# Patient Record
Sex: Female | Born: 1935 | Race: White | Hispanic: No | Smoking: Former smoker
Health system: Southern US, Community
[De-identification: ages and names within clinical notes are randomized; demographics above are authoritative.]

## PROBLEM LIST (undated history)

## (undated) DIAGNOSIS — IMO0002 Reserved for concepts with insufficient information to code with codable children: Secondary | ICD-10-CM

## (undated) DIAGNOSIS — E785 Hyperlipidemia, unspecified: Secondary | ICD-10-CM

## (undated) DIAGNOSIS — I10 Essential (primary) hypertension: Secondary | ICD-10-CM

## (undated) DIAGNOSIS — J449 Chronic obstructive pulmonary disease, unspecified: Secondary | ICD-10-CM

## (undated) DIAGNOSIS — E079 Disorder of thyroid, unspecified: Secondary | ICD-10-CM

## (undated) DIAGNOSIS — M1990 Unspecified osteoarthritis, unspecified site: Secondary | ICD-10-CM

## (undated) DIAGNOSIS — G44009 Cluster headache syndrome, unspecified, not intractable: Secondary | ICD-10-CM

## (undated) DIAGNOSIS — C50919 Malignant neoplasm of unspecified site of unspecified female breast: Secondary | ICD-10-CM

## (undated) HISTORY — DX: Chronic obstructive pulmonary disease, unspecified: J44.9

## (undated) HISTORY — DX: Malignant neoplasm of unspecified site of unspecified female breast: C50.919

## (undated) HISTORY — DX: Unspecified osteoarthritis, unspecified site: M19.90

## (undated) HISTORY — DX: Reserved for concepts with insufficient information to code with codable children: IMO0002

## (undated) HISTORY — DX: Hyperlipidemia, unspecified: E78.5

## (undated) HISTORY — DX: Essential (primary) hypertension: I10

## (undated) HISTORY — DX: Cluster headache syndrome, unspecified, not intractable: G44.009

## (undated) HISTORY — DX: Disorder of thyroid, unspecified: E07.9

---

## 1963-03-29 HISTORY — PX: TOE SURGERY: SHX1073

## 1989-03-28 DIAGNOSIS — C50919 Malignant neoplasm of unspecified site of unspecified female breast: Secondary | ICD-10-CM

## 1989-03-28 HISTORY — PX: BREAST LUMPECTOMY: SHX2

## 1989-03-28 HISTORY — DX: Malignant neoplasm of unspecified site of unspecified female breast: C50.919

## 1991-03-29 HISTORY — PX: OTHER SURGICAL HISTORY: SHX169

## 1991-03-29 HISTORY — PX: NOSE SURGERY: SHX723

## 1997-10-03 ENCOUNTER — Other Ambulatory Visit: Admission: RE | Admit: 1997-10-03 | Discharge: 1997-10-03 | Payer: Self-pay | Admitting: General Surgery

## 1997-11-03 ENCOUNTER — Ambulatory Visit (HOSPITAL_COMMUNITY): Admission: RE | Admit: 1997-11-03 | Discharge: 1997-11-03 | Payer: Self-pay | Admitting: General Surgery

## 1998-08-09 ENCOUNTER — Emergency Department (HOSPITAL_COMMUNITY): Admission: EM | Admit: 1998-08-09 | Discharge: 1998-08-09 | Payer: Self-pay | Admitting: Emergency Medicine

## 1998-08-09 ENCOUNTER — Encounter: Payer: Self-pay | Admitting: Emergency Medicine

## 2000-02-15 ENCOUNTER — Encounter: Admission: RE | Admit: 2000-02-15 | Discharge: 2000-02-15 | Payer: Self-pay | Admitting: Internal Medicine

## 2000-02-15 ENCOUNTER — Encounter: Payer: Self-pay | Admitting: Internal Medicine

## 2001-04-10 ENCOUNTER — Other Ambulatory Visit: Admission: RE | Admit: 2001-04-10 | Discharge: 2001-04-10 | Payer: Self-pay | Admitting: Internal Medicine

## 2001-04-12 ENCOUNTER — Encounter: Payer: Self-pay | Admitting: Internal Medicine

## 2001-04-12 ENCOUNTER — Encounter: Admission: RE | Admit: 2001-04-12 | Discharge: 2001-04-12 | Payer: Self-pay | Admitting: Internal Medicine

## 2002-04-18 ENCOUNTER — Encounter: Payer: Self-pay | Admitting: Internal Medicine

## 2002-04-18 ENCOUNTER — Encounter: Admission: RE | Admit: 2002-04-18 | Discharge: 2002-04-18 | Payer: Self-pay | Admitting: Internal Medicine

## 2003-07-04 ENCOUNTER — Encounter: Admission: RE | Admit: 2003-07-04 | Discharge: 2003-07-04 | Payer: Self-pay | Admitting: Internal Medicine

## 2004-02-13 ENCOUNTER — Ambulatory Visit: Payer: Self-pay | Admitting: Internal Medicine

## 2004-05-18 ENCOUNTER — Ambulatory Visit: Payer: Self-pay | Admitting: Internal Medicine

## 2004-07-13 ENCOUNTER — Encounter: Admission: RE | Admit: 2004-07-13 | Discharge: 2004-07-13 | Payer: Self-pay | Admitting: Internal Medicine

## 2005-01-21 ENCOUNTER — Ambulatory Visit: Payer: Self-pay | Admitting: Internal Medicine

## 2005-02-23 ENCOUNTER — Ambulatory Visit: Payer: Self-pay | Admitting: Internal Medicine

## 2005-07-05 ENCOUNTER — Ambulatory Visit: Payer: Self-pay | Admitting: Internal Medicine

## 2005-07-12 ENCOUNTER — Ambulatory Visit: Payer: Self-pay | Admitting: Internal Medicine

## 2005-07-19 ENCOUNTER — Encounter: Admission: RE | Admit: 2005-07-19 | Discharge: 2005-07-19 | Payer: Self-pay | Admitting: Internal Medicine

## 2005-12-30 ENCOUNTER — Ambulatory Visit: Payer: Self-pay | Admitting: Internal Medicine

## 2006-07-25 ENCOUNTER — Encounter: Admission: RE | Admit: 2006-07-25 | Discharge: 2006-07-25 | Payer: Self-pay | Admitting: Internal Medicine

## 2006-08-03 ENCOUNTER — Ambulatory Visit: Payer: Self-pay | Admitting: Internal Medicine

## 2006-08-03 LAB — CONVERTED CEMR LAB
AST: 31 units/L (ref 0–37)
CO2: 32 meq/L (ref 19–32)
Cholesterol: 171 mg/dL (ref 0–200)
Creatinine, Ser: 1.1 mg/dL (ref 0.4–1.2)
HDL: 38.1 mg/dL — ABNORMAL LOW (ref >39.0)
Sodium: 141 meq/L (ref 135–145)
Triglycerides: 210 mg/dL (ref 0–149)

## 2007-01-09 ENCOUNTER — Ambulatory Visit: Payer: Self-pay | Admitting: Internal Medicine

## 2007-04-20 ENCOUNTER — Encounter: Payer: Self-pay | Admitting: Internal Medicine

## 2007-04-20 DIAGNOSIS — I10 Essential (primary) hypertension: Secondary | ICD-10-CM | POA: Insufficient documentation

## 2007-04-20 DIAGNOSIS — Z853 Personal history of malignant neoplasm of breast: Secondary | ICD-10-CM

## 2007-04-20 DIAGNOSIS — E785 Hyperlipidemia, unspecified: Secondary | ICD-10-CM

## 2007-08-07 ENCOUNTER — Encounter: Payer: Self-pay | Admitting: Internal Medicine

## 2007-08-07 ENCOUNTER — Ambulatory Visit: Payer: Self-pay | Admitting: Family Medicine

## 2007-08-30 ENCOUNTER — Encounter: Payer: Self-pay | Admitting: Internal Medicine

## 2007-09-04 ENCOUNTER — Encounter: Admission: RE | Admit: 2007-09-04 | Discharge: 2007-09-04 | Payer: Self-pay | Admitting: Internal Medicine

## 2007-09-10 ENCOUNTER — Telehealth (INDEPENDENT_AMBULATORY_CARE_PROVIDER_SITE_OTHER): Payer: Self-pay | Admitting: *Deleted

## 2007-10-03 ENCOUNTER — Encounter: Payer: Self-pay | Admitting: Internal Medicine

## 2007-11-13 ENCOUNTER — Ambulatory Visit: Payer: Self-pay | Admitting: Internal Medicine

## 2007-11-13 DIAGNOSIS — M19079 Primary osteoarthritis, unspecified ankle and foot: Secondary | ICD-10-CM | POA: Insufficient documentation

## 2007-11-13 DIAGNOSIS — M51379 Other intervertebral disc degeneration, lumbosacral region without mention of lumbar back pain or lower extremity pain: Secondary | ICD-10-CM | POA: Insufficient documentation

## 2007-11-13 DIAGNOSIS — F17200 Nicotine dependence, unspecified, uncomplicated: Secondary | ICD-10-CM | POA: Insufficient documentation

## 2007-11-13 DIAGNOSIS — M5137 Other intervertebral disc degeneration, lumbosacral region: Secondary | ICD-10-CM

## 2007-11-13 LAB — CONVERTED CEMR LAB
ALT: 35 units/L (ref 0–35)
Basophils Relative: 0 % (ref 0.0–3.0)
CO2: 27 meq/L (ref 19–32)
Calcium: 9.2 mg/dL (ref 8.4–10.5)
Chloride: 101 meq/L (ref 96–112)
Cholesterol: 182 mg/dL (ref 0–200)
Direct LDL: 115.7 mg/dL
Eosinophils Absolute: 0.2 10*3/uL (ref 0.0–0.7)
Eosinophils Relative: 1.6 % (ref 0.0–5.0)
HCT: 46.4 % — ABNORMAL HIGH (ref 36.0–46.0)
HDL: 45.5 mg/dL (ref >39.0)
MCV: 95 fL (ref 78.0–100.0)
Monocytes Relative: 5.3 % (ref 3.0–12.0)
Neutro Abs: 9.6 10*3/uL — ABNORMAL HIGH (ref 1.4–7.7)
Neutrophils Relative %: 75.5 % (ref 43.0–77.0)
Platelets: 276 10*3/uL (ref 150–400)
RDW: 12.5 % (ref 11.5–14.6)
Sodium: 139 meq/L (ref 135–145)
Total Protein: 7.2 g/dL (ref 6.0–8.3)
Triglycerides: 222 mg/dL (ref 0–149)
VLDL: 44 mg/dL — ABNORMAL HIGH (ref 0–40)
WBC: 12.7 10*3/uL — ABNORMAL HIGH (ref 4.5–10.5)

## 2007-11-14 ENCOUNTER — Encounter: Payer: Self-pay | Admitting: Internal Medicine

## 2008-01-01 ENCOUNTER — Ambulatory Visit: Payer: Self-pay | Admitting: Internal Medicine

## 2008-09-09 ENCOUNTER — Encounter: Admission: RE | Admit: 2008-09-09 | Discharge: 2008-09-09 | Payer: Self-pay | Admitting: Internal Medicine

## 2008-10-13 ENCOUNTER — Telehealth: Payer: Self-pay | Admitting: Internal Medicine

## 2008-10-22 ENCOUNTER — Encounter: Payer: Self-pay | Admitting: Internal Medicine

## 2008-10-27 ENCOUNTER — Encounter: Payer: Self-pay | Admitting: Internal Medicine

## 2008-11-03 ENCOUNTER — Encounter: Payer: Self-pay | Admitting: Internal Medicine

## 2008-11-24 ENCOUNTER — Ambulatory Visit: Payer: Self-pay | Admitting: Internal Medicine

## 2008-11-24 LAB — CONVERTED CEMR LAB
ALT: 37 units/L — ABNORMAL HIGH (ref 0–35)
AST: 37 units/L (ref 0–37)
BUN: 12 mg/dL (ref 6–23)
Bilirubin, Direct: 0.2 mg/dL (ref 0.0–0.3)
CO2: 29 meq/L (ref 19–32)
Calcium: 9.2 mg/dL (ref 8.4–10.5)
Cholesterol: 193 mg/dL (ref 0–200)
Creatinine, Ser: 1 mg/dL (ref 0.4–1.2)
GFR calc non Af Amer: 57.2 mL/min (ref >60)
Glucose, Bld: 102 mg/dL — ABNORMAL HIGH (ref 70–99)
LDL Cholesterol: 114 mg/dL — ABNORMAL HIGH (ref 0–99)
Potassium: 4.2 meq/L (ref 3.5–5.1)
Total CHOL/HDL Ratio: 4
VLDL: 34.6 mg/dL (ref 0.0–40.0)

## 2009-01-06 ENCOUNTER — Ambulatory Visit: Payer: Self-pay | Admitting: Internal Medicine

## 2009-09-15 ENCOUNTER — Encounter: Admission: RE | Admit: 2009-09-15 | Discharge: 2009-09-15 | Payer: Self-pay | Admitting: Internal Medicine

## 2009-12-01 ENCOUNTER — Encounter: Payer: Self-pay | Admitting: Internal Medicine

## 2009-12-01 ENCOUNTER — Ambulatory Visit: Payer: Self-pay | Admitting: Internal Medicine

## 2009-12-01 LAB — CONVERTED CEMR LAB
Albumin: 3.9 g/dL (ref 3.5–5.2)
Basophils Absolute: 0 10*3/uL (ref 0.0–0.1)
Bilirubin, Direct: 0.2 mg/dL (ref 0.0–0.3)
Calcium: 9.6 mg/dL (ref 8.4–10.5)
Cholesterol: 190 mg/dL (ref 0–200)
Creatinine, Ser: 1 mg/dL (ref 0.4–1.2)
Direct LDL: 123.2 mg/dL
Eosinophils Absolute: 0.3 10*3/uL (ref 0.0–0.7)
Eosinophils Relative: 1.8 % (ref 0.0–5.0)
GFR calc non Af Amer: 57.05 mL/min (ref >60)
Glucose, Bld: 105 mg/dL — ABNORMAL HIGH (ref 70–99)
HCT: 45.6 % (ref 36.0–46.0)
HDL: 39.1 mg/dL (ref >39.00)
Lymphocytes Relative: 19.1 % (ref 12.0–46.0)
MCHC: 35.3 g/dL (ref 30.0–36.0)
MCV: 95.7 fL (ref 78.0–100.0)
Monocytes Absolute: 1.2 10*3/uL — ABNORMAL HIGH (ref 0.1–1.0)
Neutro Abs: 10.1 10*3/uL — ABNORMAL HIGH (ref 1.4–7.7)
RBC: 4.76 M/uL (ref 3.87–5.11)
Total Bilirubin: 1.5 mg/dL — ABNORMAL HIGH (ref 0.3–1.2)
Total CHOL/HDL Ratio: 5
Triglycerides: 226 mg/dL — ABNORMAL HIGH (ref 0.0–149.0)
VLDL: 45.2 mg/dL — ABNORMAL HIGH (ref 0.0–40.0)

## 2009-12-03 ENCOUNTER — Ambulatory Visit: Payer: Self-pay | Admitting: Internal Medicine

## 2009-12-07 ENCOUNTER — Encounter: Payer: Self-pay | Admitting: Internal Medicine

## 2009-12-07 ENCOUNTER — Telehealth (INDEPENDENT_AMBULATORY_CARE_PROVIDER_SITE_OTHER): Payer: Self-pay | Admitting: *Deleted

## 2009-12-08 ENCOUNTER — Ambulatory Visit: Payer: Self-pay

## 2009-12-08 ENCOUNTER — Encounter: Payer: Self-pay | Admitting: Cardiology

## 2009-12-08 ENCOUNTER — Encounter (HOSPITAL_COMMUNITY): Admission: RE | Admit: 2009-12-08 | Discharge: 2010-01-27 | Payer: Self-pay | Admitting: Internal Medicine

## 2009-12-08 ENCOUNTER — Ambulatory Visit: Payer: Self-pay | Admitting: Cardiology

## 2009-12-22 ENCOUNTER — Encounter: Payer: Self-pay | Admitting: Internal Medicine

## 2009-12-24 ENCOUNTER — Telehealth: Payer: Self-pay | Admitting: Internal Medicine

## 2010-01-01 ENCOUNTER — Telehealth (INDEPENDENT_AMBULATORY_CARE_PROVIDER_SITE_OTHER): Payer: Self-pay | Admitting: *Deleted

## 2010-01-01 ENCOUNTER — Ambulatory Visit: Payer: Self-pay | Admitting: Internal Medicine

## 2010-01-01 DIAGNOSIS — J438 Other emphysema: Secondary | ICD-10-CM

## 2010-01-12 ENCOUNTER — Encounter: Payer: Self-pay | Admitting: Internal Medicine

## 2010-01-12 ENCOUNTER — Ambulatory Visit: Payer: Self-pay | Admitting: Internal Medicine

## 2010-01-20 ENCOUNTER — Ambulatory Visit: Payer: Self-pay | Admitting: Critical Care Medicine

## 2010-01-25 ENCOUNTER — Encounter: Payer: Self-pay | Admitting: Internal Medicine

## 2010-04-29 NOTE — Progress Notes (Signed)
Summary: Nuclear pre procedure  Phone Note Outgoing Call Call back at [facility] Phone [phone]   Call placed by: Rea College, CMA,  December 07, 2009 2:50 PM Call placed to: Patient Summary of Call: Reviewed information on [employer] Information Sheet (see scanned document for further details).  Spoke with patient.      Nuclear Med Background Indications for Stress Test: Evaluation for Ischemia, Abnormal EKG   History: COPD, Emphysema   Symptoms: DOE    Nuclear Pre-Procedure Cardiac Risk Factors: Family History - CAD, Hypertension, Lipids, Smoker Height (in): 64

## 2010-04-29 NOTE — Assessment & Plan Note (Signed)
Summary: Pulmonary Consultation   Copy to:  Dr. Debby Bud Primary Provider/Referring Provider:  Jacques Navy MD  CC:  Pulmonary Consult and COPD initial evaluation.  History of Present Illness: Pulmonary Consultation       This is a 77 year old woman who presents for COPD initial evaluation.  The patient complains of shortness of breath, wheezing, mucous production, and exercise induced symptoms, but denies history of diagnosed COPD, chest tightness, chest pain worse with breathing and coughing, cough, nocturnal awakening, and congestion.  Prior evaluation and testing has included Cxr and Complete PFT.  The dyspnea is described as at rest, with ADL's, with walking within room, with walking one or two blocks, with walking stairs, with walking from the car to building, with walking to the mailbox and back, and during the day.  Associated disease(s) include(s) indigestion, hand/feet swelling, non-productive cough, and chest tightness.  Oxygen evaluation is described as not on supplemental O2.    Pt lives alone, still smoking.  Dyspnea worsening over past several months.  Referred for copd eval.  Preventive Screening-Counseling & Management  Alcohol-Tobacco     Smoking Status: current     Packs/Day: 1.0     Pack years: 60  Current Medications (verified): 1)  Catapres 0.1 Mg Tabs (Clonidine Hcl) .... Take 1 Tablet By Mouth Two Times A Day 2)  Levoxyl 50 Mcg Tabs (Levothyroxine Sodium) .... Take 1 Tablet By Mouth Once A Day 3)  Lisinopril-Hydrochlorothiazide 20-12.5 Mg Tabs (Lisinopril-Hydrochlorothiazide) .... Take 1 Tablet By Mouth Once A Day 4)  Lovastatin 40 Mg  Tabs (Lovastatin) .... Take 1 Tablet By Mouth Once A Day 5)  Verapamil Hcl Cr 240 Mg Tbcr (Verapamil Hcl) .... Take 1 Tablet By Mouth Two Times A Day 6)  Multivitamins   Tabs (Multiple Vitamin) .... Take 1 Tablet By Mouth Once A Day 7)  Benadryl 25 Mg  Caps (Diphenhydramine Hcl) .... Take 2 Tab By Mouth At Bedtime 8)  Vitamin D  400 Unit  Caps (Cholecalciferol) .... Take 1 Tablet By Mouth Once A Day 9)  Methocarbamol 750 Mg  Tabs (Methocarbamol) .... As Directed As Needed 10)  Advil 200 Mg  Tabs (Ibuprofen) .Marland Kitchen.. 1 -2 Daily As Needed For Knee and Back Pain.  Allergies (verified): No Known Drug Allergies  Past History:  Past Medical History: Breast cancer, hx of-right, s/p lumpectomy and radiation '91 Hyperlipidemia Hypertension Degenerative disk disease Degenerative joint disease -ankles h/o cluster headache - remote COPD Golds stage II FeV1 63% 11/2009   Past Surgical History: Reviewed history from 11/13/2007 and no changes required. Lumpectomy-right breast '91 Nose surgery and chin lift in 1993 Surgery on her fifth toe in the past-right foot '65    G1P1  Family History: Reviewed history from 11/13/2007 and no changes required. father - deceased @ 70: CAD/MI, DM mother-deceased @ 63: breast cancer Neg- colon cancer  Social History: Reviewed history from 11/13/2007 and no changes required. HSG, [employer] x 1year married - '56 - 3 years, divorced; '59 - 26 years, divorced. 1 son - '60; 3 adopted daughters - '48, '51, '53: 2 grandchildren work - IT consultant, property mgt, Research officer, political party,  retired. $$ - tight lives alone - house-owned/paid for. Son lives near-by. Living Will - wants to full code. current smoker.  1 ppd x  60 yrs. 4 glasses of wine dailyPacks/Day:  1.0 Pack years:  60  Review of Systems       The patient complains of shortness of breath with activity,  non-productive cough, acid heartburn, hand/feet swelling, and change in color of mucus.  The patient denies shortness of breath at rest, productive cough, coughing up blood, chest pain, irregular heartbeats, indigestion, loss of appetite, weight change, abdominal pain, difficulty swallowing, sore throat, tooth/dental problems, headaches, nasal congestion/difficulty breathing through nose, sneezing, itching, ear ache, anxiety,  depression, joint stiffness or pain, rash, and fever.        See HPI for Pulmonary, Cardiac, General, and ENT review of systems.  Vital Signs:  Patient profile:   77 year old female Height:      66.5 inches Weight:      186.25 pounds BMI:     29.72 O2 Sat:      98 % on Room air Temp:     97.6 degrees F oral Pulse rate:   77 / minute BP sitting:   158 / 104  (left arm) Cuff size:   regular  Vitals Entered By: Gweneth Dimitri RN (January 20, 2010 11:06 AM)  O2 Flow:  Room air CC: Pulmonary Consult, COPD initial evaluation Comments Medications reviewed with patient. Daytime contact number verified with patient. Gweneth Dimitri RN  January 20, 2010 11:07 AM    Physical Exam  Additional Exam:  Gen: WF, in no distress,  normal affect ENT: No lesions,  mouth clear,  oropharynx clear, no postnasal drip Neck: No JVD, no TMG, no carotid bruits Lungs: No use of accessory muscles, no dullness to percussion, distant BS, prolonged exp phase, hyperresonant to percussion  Cardiovascular: RRR, heart sounds normal, no murmur or gallops, no peripheral edema Abdomen: soft and NT, no HSM,  BS normal Musculoskeletal: No deformities, no cyanosis or clubbing Neuro: alert, non focal Skin: Warm, no lesions or rashes    CXR  Procedure date:  11/24/2008  Findings:      IMPRESSION: No active disease.  No significant change.  Pulmonary Function Test Date: 12/03/2009 Gender: Female  Pre-Spirometry FVC    Value: 2.32 L/min   Pred: 2.89 L/min     % Pred: 80 % FEV1    Value: 1.26 L     Pred: 2.02 L     % Pred: 63 % FEV1/FVC  Value: 55 %     Pred: 70 %    FEF 25-75  Value: 0.44 L/min   Pred: 2.20 L/min     % Pred: 20 %  Post-Spirometry FVC    Value: 2.63 L/min   Pred: 2.89 L/min     % Pred: 91 % FEV1    Value: 1.34 L     Pred: 2.02 L     % Pred: 66 % FEV1/FVC  Value: 51 %     Pred: 70 %    FEF 25-75  Value: 0.35 L/min   Pred: 2.20 L/min     % Pred: 16 %  Lung Volumes TLC    Value: 5.16 L   %  Pred: 107 % RV    Value: 3.21 L   % Pred: 149 % DLCO    Value: 9.5 %   % Pred: 44 % DLCO/VA  Value: 2.61 %   % Pred: 78 %  Comments: Moderate obstructive lung disease,  airtrapping on lung volumes, severe reduction in dlco due to emphysematous changes  Impression & Recommendations:  Problem # 1:  EMPHYSEMA (ICD-492.8) Assessment Deteriorated COPD with primary emphysematous component.  Golds stage II    by spirometry criteria.  FeV1 63% predicted  plan smoking cessation with nicotrol inhaler start spiriva, daily  as needed ventolin  Problem # 2:  CIGARETTE SMOKER (ICD-305.1) Assessment: Unchanged ongoing nicotine addiction plan trial nicotrol inhaler for smoking cessation Her updated medication list for this problem includes:    Nicotrol 10 Mg Inha (Nicotine) .Marland KitchenMarland KitchenMarland KitchenMarland Kitchen 60-80 puffs per cartridge, 6 cartridges per day  Medications Added to Medication List This Visit: 1)  Catapres 0.1 Mg Tabs (Clonidine hcl) .... Take 1 tablet by mouth two times a day 2)  Verapamil Hcl Cr 240 Mg Tbcr (Verapamil hcl) .... Take 1 tablet by mouth two times a day 3)  Spiriva Handihaler 18 Mcg Caps (Tiotropium bromide monohydrate) .... Two puffs in handihaler daily 4)  Ventolin Hfa 108 (90 Base) Mcg/act Aers (Albuterol sulfate) .Marland Kitchen.. 1-2 puffs every 4-6 hours as needed 5)  Nicotrol 10 Mg Inha (Nicotine) .... 60-80 puffs per cartridge, 6 cartridges per day  Complete Medication List: 1)  Catapres 0.1 Mg Tabs (Clonidine hcl) .... Take 1 tablet by mouth two times a day 2)  Levoxyl 50 Mcg Tabs (Levothyroxine sodium) .... Take 1 tablet by mouth once a day 3)  Lisinopril-hydrochlorothiazide 20-12.5 Mg Tabs (Lisinopril-hydrochlorothiazide) .... Take 1 tablet by mouth once a day 4)  Lovastatin 40 Mg Tabs (Lovastatin) .... Take 1 tablet by mouth once a day 5)  Verapamil Hcl Cr 240 Mg Tbcr (Verapamil hcl) .... Take 1 tablet by mouth two times a day 6)  Multivitamins Tabs (Multiple vitamin) .... Take 1 tablet by mouth  once a day 7)  Benadryl 25 Mg Caps (Diphenhydramine hcl) .... Take 2 tab by mouth at bedtime 8)  Vitamin D 400 Unit Caps (Cholecalciferol) .... Take 1 tablet by mouth once a day 9)  Methocarbamol 750 Mg Tabs (Methocarbamol) .... As directed as needed 10)  Advil 200 Mg Tabs (Ibuprofen) .Marland Kitchen.. 1 -2 daily as needed for knee and back pain. 11)  Spiriva Handihaler 18 Mcg Caps (Tiotropium bromide monohydrate) .... Two puffs in handihaler daily 12)  Ventolin Hfa 108 (90 Base) Mcg/act Aers (Albuterol sulfate) .Marland Kitchen.. 1-2 puffs every 4-6 hours as needed 13)  Nicotrol 10 Mg Inha (Nicotine) .... 60-80 puffs per cartridge, 6 cartridges per day  Other Orders: Pneumococcal Vaccine (16109) Admin 1st Vaccine (60454)  Patient Instructions: 1)  Spiriva one capsule two puffs daily 2)  Ventolin as needed  2 puffs every 4-6 hours 3)  Nicotrol inhaler for smoking cessation and stop smoking 4)  Return 6 weeks Prescriptions: NICOTROL 10 MG INHA (NICOTINE) 60-80 puffs per cartridge, 6 cartridges per day  #1 month x 2   Entered and Authorized by:   Storm Frisk MD   Signed by:   Storm Frisk MD on 01/20/2010   Method used:   Electronically to        [id number] (retail)       [address]       [place], [place]  [address]       Ph: [phone]       Fax: [phone]   RxID:   [id number] VENTOLIN HFA 108 (90 BASE) MCG/ACT  AERS (ALBUTEROL SULFATE) 1-2 puffs every 4-6 hours as needed  #1 x 6   Entered and Authorized by:   Storm Frisk MD   Signed by:   Storm Frisk MD on 01/20/2010   Method used:   Electronically to        [facility] (retail)       [address]       [place], [place]  [address]  Ph: [phone]       Fax: [phone]   RxID:   [id number] SPIRIVA HANDIHALER 18 MCG  CAPS (TIOTROPIUM BROMIDE MONOHYDRATE) Two puffs in handihaler daily  #30 x 6   Entered and Authorized by:   Storm Frisk MD   Signed by:   Storm Frisk MD on 01/20/2010   Method used:    Electronically to        [facility] (retail)       [address]       [place], [place]  [address]       Ph: [phone]       Fax: [phone]   RxID:   [id number]    Immunizations Administered:  Pneumonia Vaccine:    Vaccine Type: Pneumovax (Medicare)    Site: left deltoid    Mfr: Merck    Dose: 0.5 ml    Route: IM    Given by: Gweneth Dimitri RN    Exp. Date: 06/14/2011    Lot #: [id number]    VIS given: 03/02/09 version given January 20, 2010.   Prevention & Chronic Care Immunizations   Influenza vaccine: Fluvax 3+  (12/01/2009)    Tetanus booster: 08/03/2006: Td    Pneumococcal vaccine: Pneumovax (Medicare)  (01/20/2010)    H. zoster vaccine: 08/03/2006: Zostavax  Colorectal Screening   Hemoccult: Not documented    Colonoscopy: Done  (11/12/2002)  Other Screening   Pap smear: Not documented    Mammogram: ASSESSMENT: Negative - BI-RADS 1^MM DIGITAL SCREENING  (09/15/2009)    DXA bone density scan: abnormal  (10/27/2006)   Smoking status: current  (01/20/2010)   Smoking cessation counseling: yes  (12/01/2009)  Lipids   Total Cholesterol: 190  (12/01/2009)   LDL: 114  (11/24/2008)   LDL Direct: 123.2  (12/01/2009)   HDL: 39.10  (12/01/2009)   Triglycerides: 226.0  (12/01/2009)    SGOT (AST): 24  (12/01/2009)   SGPT (ALT): 26  (12/01/2009)   Alkaline phosphatase: 68  (12/01/2009)   Total bilirubin: 1.5  (12/01/2009)  Hypertension   Last Blood Pressure: 158 / 104  (01/20/2010)   Serum creatinine: 1.0  (12/01/2009)   Serum potassium 4.4  (12/01/2009)  Self-Management Support :    Hypertension self-management support: Not documented    Lipid self-management support: Not documented    Nursing Instructions: Give Pneumovax today

## 2010-04-29 NOTE — Progress Notes (Signed)
  Phone Note Call from Patient Call back at Home Phone [phone]   Caller: Patient Summary of Call: Patient called stating that she was seen today and failed to request an order for a bone density. Per EMR last one done 08/07/2007. Please advise Thanks Initial call taken by: Rock Nephew CMA,  January 01, 2010 1:22 PM  Follow-up for Phone Call        OK for bone density study - please schedule Follow-up by: Jacques Navy MD,  January 01, 2010 1:30 PM  Additional Follow-up for Phone Call Additional follow up Details #1::        Pt sched for 10/18 at 10 am. We need diagnosis code. Additional Follow-up by: Verdell Face,  January 05, 2010 3:55 PM    Additional Follow-up for Phone Call Additional follow up Details #2::    [phone]  Thanks Follow-up by: Jacques Navy MD,  January 05, 2010 5:57 PM

## 2010-04-29 NOTE — Progress Notes (Signed)
Summary: RESULTS  Phone Note Call from Patient Call back at [facility] Phone [phone]   Summary of Call: Patient is requesting results of test. I see letter in EMR, will call patient w/resutls.  Initial call taken by: Lamar Sprinkles, CMA,  December 24, 2009 12:05 PM  Follow-up for Phone Call        Spoke w/pt, She recieved the letter. Pt wants to know - Does she need an inhaler? Should she buy one OTC? (I explained, I don't believe that is possible but she found it is) Should she see pulmonary md?  Follow-up by: Lamar Sprinkles, CMA,  December 25, 2009 12:34 PM  Additional Follow-up for Phone Call Additional follow up Details #1::        PFT's reveal no significant response to bronchodilators. She should have ov to review findings and treatments for ephysema. Additional Follow-up by: Jacques Navy MD,  December 25, 2009 12:54 PM    Additional Follow-up for Phone Call Additional follow up Details #2::    Pt scheduled for office visit this week to discuss Follow-up by: Lamar Sprinkles, CMA,  December 28, 2009 12:19 PM

## 2010-04-29 NOTE — Assessment & Plan Note (Signed)
Summary: YEARLY--D/T---STC   Vital Signs:  Patient profile:   77 year old female Height:      64 inches Weight:      186 pounds BMI:     32.04 O2 Sat:      95 % on Room air Temp:     96.8 degrees F oral Pulse rate:   65 / minute BP sitting:   144 / 80  (left arm) Cuff size:   regular  Vitals Entered By: Renee Donovan CMA (December 01, 2009 10:15 AM)  O2 Flow:  Room air CC: pt here for yearly visit/ ab Comments Pt states she is not taking vit c and calcium/ab  Vision Screening:      Vision Comments: Pt has eye exam every two years, pt had lasic eye surg about a year and a half ago   Primary Care Provider:  Jacques Navy MD  CC:  pt here for yearly visit/ ab.  History of Present Illness: Presents for annual visit and medical follow-up.  She reports that she is having increased low back pain that limits her activities. No radiation, no paresthesia. No pain when supine or sitting. She has had mutiple imaging studies by her orthopedist, Dr. Yisroel Donovan.  She has arthiritc pain in both feet. The pain limits her activities. She does see Dr. Yisroel Donovan for this as well.   She reports that she has DOE. No cough. No wheezing. She had a chest x-ray last year - no active disease.  Under a lot of stress with family problems: son Renee Donovan be heading for divorce. She is OK financially - frugal and this works.   She has help with cleaning but otherwise remains independent in her ADLs. 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 She uses cane for balance but has not had any falls. she has no injury risk.   Preventive Screening-Counseling & Management  Alcohol-Tobacco     Alcohol drinks/day: 1     Alcohol type: wine     >5/day in last 3 mos: no     Smoking Status: current     Smoking Cessation Counseling: yes     Smoke Cessation Stage: precontemplative     Packs/Day: 1.5  Caffeine-Diet-Exercise     Caffeine use/day: none     Does Patient Exercise: no     MSH  Depression Score: no depression  Hep-HIV-STD-Contraception     Dental Visit-last 6 months yes     Sun Exposure-Excessive: no  Safety-Violence-Falls     Seat Belt Use: yes     Firearms in the Home: firearms in the home     Smoke Detectors: yes     Violence in the Home: no risk noted     Sexual Abuse: no     Fall Risk: no falls in the past year  Current Medications (verified): 1)  Catapres 0.1 Mg Tabs (Clonidine Hcl) .... Take 1 Tablet By Mouth Two Times A Day 2)  Levoxyl 50 Mcg Tabs (Levothyroxine Sodium) .... Take 1 Tablet By Mouth Once A Day 3)  Lisinopril-Hydrochlorothiazide 20-12.5 Mg Tabs (Lisinopril-Hydrochlorothiazide) .... Take 1 Tablet By Mouth Once A Day 4)  Lovastatin 40 Mg  Tabs (Lovastatin) .... Take 1 Tablet By Mouth Once A Day 5)  Verapamil Hcl Cr 240 Mg Tbcr (Verapamil Hcl) .... Take 1 Tablet By Mouth Two Times A Day 6)  Multivitamins   Tabs (Multiple Vitamin) .... Take 1 Tablet By Mouth Once A Day 7)  Calcium 600 600 Mg  Tabs (Calcium Carbonate) .... Take 1 Tablet By Mouth Two Times A Day 8)  Vitamin C 1000 Mg  Tabs (Ascorbic Acid) .... Take 1 Tablet By Mouth Once A Day 9)  Benadryl 25 Mg  Caps (Diphenhydramine Hcl) .... Take 2 Tab By Mouth At Bedtime 10)  Vitamin D 400 Unit  Caps (Cholecalciferol) .... Take 1 Tablet By Mouth Once A Day 11)  Methocarbamol 750 Mg  Tabs (Methocarbamol) .... As Directed As Needed 12)  Advil 200 Mg  Tabs (Ibuprofen) .Marland Kitchen.. 1 -2 Daily As Needed For Knee and Back Pain.  Allergies (verified): No Known Drug Allergies  Past History:  Past Medical History: Last updated: 12-08-07 Breast cancer, hx of-right, s/p lumpectomy and radiation '91 Hyperlipidemia Hypertension Degenerative disk disease Degenerative joint disease -ankles h/o cluster headache - remote  Past Surgical History: Last updated: 12-08-07 Lumpectomy-right breast '91 Nose surgery and chin lift in 1993 Surgery on her fifth toe in the past-right foot  '65    G1P1  Family History: Last updated: Dec 08, 2007 father - deceased @ 47: CAD/MI, DM mother-deceased @ 10: breast cancer Neg- colon cancer  Social History: Last updated: 12/08/07 HSG, [employer] x 1year married - '56 - 3 years, divorced; '59 - 26 years, divorced. 1 son - '60; 3 adopted daughters - '48, '51, '53: 2 grandchildren work - IT consultant, property mgt, Research officer, political party,  retired. $$ - tight lives alone - house-owned/paid for. Son lives near-by. Living Will - wants to full code.  Social History: Packs/Day:  1.5 Caffeine use/day:  none Dental Care w/in 6 mos.:  yes Sun Exposure-Excessive:  no Seat Belt Use:  yes Fall Risk:  no falls in the past year  Review of Systems       The patient complains of dyspnea on exertion, peripheral edema, severe indigestion/heartburn, and difficulty walking.  The patient denies anorexia, fever, weight loss, weight gain, vision loss, decreased hearing, chest pain, syncope, prolonged cough, hemoptysis, melena, hematochezia, incontinence, muscle weakness, suspicious skin lesions, depression, abnormal bleeding, and angioedema.         ankles swell with standing and when arthritis flares. Takes tums for indigestion. No dysphagia.  Physical Exam  General:  elderly white female in no distress Head:  normocephalic and atraumatic.   Eyes:  vision grossly intact, pupils equal, pupils round, corneas and lenses clear, and no injection.   Ears:  R ear normal and L ear normal.   Nose:  no external deformity and no external erythema.   Mouth:  no oral lesions. Posterior pharynx clear Neck:  supple, full ROM, no thyromegaly, and no carotid bruits.   Chest Wall:  No deformities, masses, or tenderness noted. Breasts:  right breast with post-surgical changes with tuck in the right-upper-outer quadrant. Fibrous post-radiation changes in the breast parenchyma. Left breast normal skin, no nipple discharge. cystic changes with large cyst at  1100-0100 position.  Lungs:  normal respiratory effort, no intercostal retractions, and no accessory muscle use.  End-expiratory wheeze.  Heart:  normal rate, regular rhythm, no murmur, and no JVD.   Abdomen:  protruberant, BS + x 4, soft. No guarding or rebound Genitalia:  deferred Msk:  normal ROM, no joint tenderness, no joint swelling, and no joint warmth.  No frank deformity of feet.  Pulses:  2+ radial pulses Extremities:  no edema Neurologic:  alert & oriented X3, cranial nerves II-XII intact, strength normal in all extremities, gait normal, and DTRs symmetrical and normal.   Skin:  Dry skin. Abrasion that appears chronic at the left elbow. No other suspicious lesions.  Cervical Nodes:  no anterior cervical adenopathy and no posterior cervical adenopathy.   Axillary Nodes:  no R axillary adenopathy and no L axillary adenopathy.   Psych:  Oriented X3, memory intact for recent and remote, normally interactive, and good eye contact.     Impression & Recommendations:  Problem # 1:  CIGARETTE SMOKER (ICD-305.1)  Patient continues to smoke. She is counselled to stop. She is not interested in stopping at this time. Reviewed CXR from '10 - no lesions, possible early COPD.  Spirometry/PFT reveals decreased FVC at 70% predicted. She is at high risk for COPD/emphysema.  Plan - refer for full PFT's  Orders: Pulmonary Referral (Pulmonary)  Problem # 2:  DEGENERATIVE DISC DISEASE, LUMBAR SPINE (ICD-722.52) On-going pain but stable. She follows with Dr. Margreta Journey.  Problem # 3:  HYPERTENSION (ICD-401.9)  Her updated medication list for this problem includes:    Catapres 0.1 Mg Tabs (Clonidine hcl) .Marland Kitchen... Take 1 tablet by mouth once daily    Lisinopril-hydrochlorothiazide 20-12.5 Mg Tabs (Lisinopril-hydrochlorothiazide) .Marland Kitchen... Take 1 tablet by mouth once a day    Verapamil Hcl Cr 240 Mg Tbcr (Verapamil hcl) .Marland Kitchen... Take 1 tablet by mouth once daily  Orders: TLB-BMP (Basic Metabolic  Panel-BMET) (80048-METABOL) Prescription Created Electronically [phone])  BP today: 144/80 Prior BP: 148/80 (11/24/2008)  Suboptimal control. Of note the patient reduced clonipin and verapamil to once a day.  Plan - routine follow-up lab          continue present medications.   Problem # 4:  HYPERLIPIDEMIA (ICD-272.4) Due for follow-up lab with recommendations to follow.   Her updated medication list for this problem includes:    Lovastatin 40 Mg Tabs (Lovastatin) .Marland Kitchen... Take 1 tablet by mouth once a day  Orders: TLB-Lipid Panel (80061-LIPID) TLB-Hepatic/Liver Function Pnl (80076-HEPATIC) Prescription Created Electronically [phone])  Addendum - LDL 123.2 - ok control   Problem # 5:  BREAST CANCER, HX OF (ICD-V10.3) Stable.  Problem # 6:  ABNORMAL ELECTROCARDIOGRAM (ICD-794.31)  EKG reveals poor r-wave in V1, V2 and T-wave abnormality - suggestive of old injury and active ischemia. Patient with multiple risk factors including HTN, Lipids, Smoker, female post-menopausal.  Plan - adenosine NST  Orders: Cardiolite (Cardiolite)  Problem # 7:  Preventive Health Care (ICD-V70.0) Unremarkable history with no new problems. Her physical exam is without focal findings. Labs results are general within normal limits. Immunizations - up to date with pneumovax, shingles vaccine and had flu shot today. Colorectral cancer screen current with colonoscopy in '04; current with mammography - June '11.  Patient without signs or symptoms of depression. she is cognitively normal. She is independent in ADLs except for heavy housework. She is counselled to stop smoking.  IN summary - a very nice woman with problems as listed who seems medically stable but is in need for further evaluation and risk stratification. Will follow up with study results.   Complete Medication List: 1)  Catapres 0.1 Mg Tabs (Clonidine hcl) .... Take 1 tablet by mouth once daily 2)  Levoxyl 50 Mcg Tabs (Levothyroxine sodium)  .... Take 1 tablet by mouth once a day 3)  Lisinopril-hydrochlorothiazide 20-12.5 Mg Tabs (Lisinopril-hydrochlorothiazide) .... Take 1 tablet by mouth once a day 4)  Lovastatin 40 Mg Tabs (Lovastatin) .... Take 1 tablet by mouth once a day 5)  Verapamil Hcl Cr 240 Mg Tbcr (Verapamil hcl) .... Take 1 tablet by mouth once daily  6)  Multivitamins Tabs (Multiple vitamin) .... Take 1 tablet by mouth once a day 7)  Calcium 600 600 Mg Tabs (Calcium carbonate) .... Take 1 tablet by mouth two times a day 8)  Vitamin C 1000 Mg Tabs (Ascorbic acid) .... Take 1 tablet by mouth once a day 9)  Benadryl 25 Mg Caps (Diphenhydramine hcl) .... Take 2 tab by mouth at bedtime 10)  Vitamin D 400 Unit Caps (Cholecalciferol) .... Take 1 tablet by mouth once a day 11)  Methocarbamol 750 Mg Tabs (Methocarbamol) .... As directed as needed 12)  Advil 200 Mg Tabs (Ibuprofen) .Marland Kitchen.. 1 -2 daily as needed for knee and back pain.  Other Orders: TLB-CBC Platelet - w/Differential (85025-CBCD) Flu Vaccine 80yrs + MEDICARE PATIENTS (Z6109) Administration Flu vaccine - MCR (G0008) [facility] -Subsequent Annual Wellness Visit [phone])   Patient: Renee Donovan Note: All result statuses are Final unless otherwise noted.  Tests: (1) Lipid Panel (LIPID)   Cholesterol               190 mg/dL                   0-981     ATP III Classification            Desirable:  < 200 mg/dL                    Borderline High:  200 - 239 mg/dL               High:  > = 240 mg/dL   Triglycerides        [H]  226.0 mg/dL                 1.9-147.8     Normal:  <150 mg/dL     Borderline High:  295 - 199 mg/dL   HDL                       62.13 mg/dL                 >08.65   VLDL Cholesterol     [H]  45.2 mg/dL                  7.8-46.9  CHO/HDL Ratio:  CHD Risk                             5                    Men          Women     1/2 Average Risk     3.4          3.3     Average Risk          5.0          4.4     2X Average Risk          9.6           7.1     3X Average Risk          15.0          11.0                           Tests: (2) Hepatic/Liver Function Panel (HEPATIC)   Total Bilirubin      [  H]  1.5 mg/dL                   1.6-1.0   Direct Bilirubin          0.2 mg/dL                   9.6-0.4   Alkaline Phosphatase      68 U/L                      39-117   AST                       24 U/L                      0-37   ALT                       26 U/L                      0-35   Total Protein             6.8 g/dL                    5.4-0.9   Albumin                   3.9 g/dL                    8.1-1.9  Tests: (3) BMP (METABOL)   Sodium                    144 mEq/L                   135-145   Potassium                 4.4 mEq/L                   3.5-5.1   Chloride                  102 mEq/L                   96-112   Carbon Dioxide            31 mEq/L                    19-32   Glucose              [H]  105 mg/dL                   14-78   BUN                       12 mg/dL                    2-95   Creatinine                1.0 mg/dL                   6.2-1.3   Calcium                   9.6 mg/dL  8.4-10.5   GFR                       57.05 mL/min                >60  Tests: (4) CBC Platelet w/Diff (CBCD)   White Cell Count     [H]  14.3 K/uL                   4.5-10.5   Red Cell Count            4.76 Mil/uL                 3.87-5.11   Hemoglobin           [H]  16.1 g/dL                   16.1-09.6   Hematocrit                45.6 %                      36.0-46.0   MCV                       95.7 fl                     78.0-100.0   MCHC                      35.3 g/dL                   04.5-40.9   RDW                       13.5 %                      11.5-14.6   Platelet Count            271.0 K/uL                  150.0-400.0   Neutrophil %              70.5 %                      43.0-77.0   Lymphocyte %              19.1 %                      12.0-46.0   Monocyte %                8.4 %                        3.0-12.0   Eosinophils%              1.8 %                       0.0-5.0   Basophils %               0.2 %                       0.0-3.0   Neutrophill Absolute [H]  10.1 K/uL  1.4-7.7   Lymphocyte Absolute       2.7 K/uL                    0.7-4.0   Monocyte Absolute    [H]  1.2 K/uL                    0.1-1.0  Eosinophils, Absolute                             0.3 K/uL                    0.0-0.7   Basophils Absolute        0.0 K/uL                    0.0-0.1  Tests: (5) Cholesterol LDL - Direct (DIRLDL)  Cholesterol LDL - Direct                             123.2 mg/dLPrescriptions: METHOCARBAMOL 750 MG  TABS (METHOCARBAMOL) as directed as needed  #90 x 3   Entered and Authorized by:   Renee Navy MD   Signed by:   Renee Navy MD on 12/01/2009   Method used:   Electronically to        [name]* (retail)       [address]       [place], [place]  [address]       Ph: [phone] or [phone]       Fax: [phone]   RxID:   [id number] VERAPAMIL HCL CR 240 MG TBCR (VERAPAMIL HCL) Take 1 tablet by mouth once daily  #90 x 3   Entered and Authorized by:   Renee Navy MD   Signed by:   Renee Navy MD on 12/01/2009   Method used:   Electronically to        [name] (retail)       [address]       [place], [place]  [address]       Ph: [phone] or [phone]       Fax: [phone]   RxID:   [id number] LOVASTATIN 40 MG  TABS (LOVASTATIN) Take 1 tablet by mouth once a day  #90 x 3   Entered and Authorized by:   Renee Navy MD   Signed by:   Renee Navy MD on 12/01/2009   Method used:   Electronically to        [name] (retail)       [address]       [place], [place]  [address]       Ph: [phone] or [phone]       Fax: [phone]   RxID:   [id number] LISINOPRIL-HYDROCHLOROTHIAZIDE 20-12.5  MG TABS (LISINOPRIL-HYDROCHLOROTHIAZIDE) Take 1 tablet by mouth once a day  #90 Tablet x 3   Entered and Authorized by:   Renee Navy MD   Signed by:   Renee Navy MD on 12/01/2009   Method used:   Electronically to        [name] [phone]* (retail)  [address]       [place], [place]  [address]       Ph: [phone] or [phone]       Fax: [phone]   RxID:   [id number] LEVOXYL 50 MCG TABS (LEVOTHYROXINE SODIUM) Take 1 tablet by mouth once a day  #90 Tablet x 3   Entered and Authorized by:   Renee Navy MD   Signed by:   Renee Navy MD on 12/01/2009   Method used:   Electronically to        [name] (retail)       [address]       [place], [place]  [address]       Ph: [phone] or [phone]       Fax: [phone]   RxID:   [id number] CATAPRES 0.1 MG TABS (CLONIDINE HCL) Take 1 tablet by mouth once daily  #90 x 3   Entered and Authorized by:   Renee Navy MD   Signed by:   Renee Navy MD on 12/01/2009   Method used:   Electronically to        [name] (retail)       [address]       [place], [place]  [address]       Ph: [phone] or [phone]       Fax: [phone]   RxID:   [id number]      Flu Vaccine Consent Questions     Do you have a history of severe allergic reactions to this vaccine? no    Any prior history of allergic reactions to egg and/or gelatin? no    Do you have a sensitivity to the preservative Thimersol? no    Do you have a past history of Guillan-Barre Syndrome? no    Do you currently have an acute febrile illness? no    Have you ever had a severe reaction to latex? no    Vaccine information given and explained to patient? yes    Are you currently pregnant? no    Lot Number:[id number]   Exp Date:09/25/2010   Site Given  Left Deltoid IMflu

## 2010-04-29 NOTE — Assessment & Plan Note (Signed)
Summary: discuss pft results/SD   Vital Signs:  Patient profile:   77 year old female Height:      64 inches Weight:      185 pounds BMI:     31.87 O2 Sat:      96 % on Room air Temp:     97.1 degrees F oral Pulse rate:   62 / minute BP sitting:   142 / 88  (left arm) Cuff size:   regular  Vitals Entered By: Bill Salinas CMA (January 01, 2010 10:59 AM)  O2 Flow:  Room air CC: ov to discuss PFTs/ ab   Primary Care Provider:  Jacques Navy MD  CC:  ov to discuss PFTs/ ab.  History of Present Illness: Patient presents to discuss results of PFTs. She has had no new problems  Current Medications (verified): 1)  Catapres 0.1 Mg Tabs (Clonidine Hcl) .... Take 1 Tablet By Mouth Once Daily 2)  Levoxyl 50 Mcg Tabs (Levothyroxine Sodium) .... Take 1 Tablet By Mouth Once A Day 3)  Lisinopril-Hydrochlorothiazide 20-12.5 Mg Tabs (Lisinopril-Hydrochlorothiazide) .... Take 1 Tablet By Mouth Once A Day 4)  Lovastatin 40 Mg  Tabs (Lovastatin) .... Take 1 Tablet By Mouth Once A Day 5)  Verapamil Hcl Cr 240 Mg Tbcr (Verapamil Hcl) .... Take 1 Tablet By Mouth Once Daily 6)  Multivitamins   Tabs (Multiple Vitamin) .... Take 1 Tablet By Mouth Once A Day 7)  Benadryl 25 Mg  Caps (Diphenhydramine Hcl) .... Take 2 Tab By Mouth At Bedtime 8)  Vitamin D 400 Unit  Caps (Cholecalciferol) .... Take 1 Tablet By Mouth Once A Day 9)  Methocarbamol 750 Mg  Tabs (Methocarbamol) .... As Directed As Needed 10)  Advil 200 Mg  Tabs (Ibuprofen) .Marland Kitchen.. 1 -2 Daily As Needed For Knee and Back Pain.  Allergies (verified): No Known Drug Allergies PMH-FH-SH reviewed-no changes except otherwise noted  Review of Systems       The patient complains of dyspnea on exertion.  The patient denies anorexia, weight loss, weight gain, peripheral edema, abdominal pain, muscle weakness, difficulty walking, and enlarged lymph nodes.     Impression & Recommendations:  Problem # 1:  EMPHYSEMA (ICD-492.8) Patient's PFTs  revealed fixed obstructive pulmonary disease (COPD) with an inadequate response to broncho-dilators. Her diffusion capacity was markedly reduced. Provided full explanation of these results including cartoon of emphysematous changes in astrucure of alevoli.  Plan - refer to pulmonary for consultation on maximizing therapy to maximize functional status, i.e. use of spiriva.  Orders: Pulmonary Referral (Pulmonary)  (more than 50% of this 25 minute visit was devoted to education and counseling )  Complete Medication List: 1)  Catapres 0.1 Mg Tabs (Clonidine hcl) .... Take 1 tablet by mouth once daily 2)  Levoxyl 50 Mcg Tabs (Levothyroxine sodium) .... Take 1 tablet by mouth once a day 3)  Lisinopril-hydrochlorothiazide 20-12.5 Mg Tabs (Lisinopril-hydrochlorothiazide) .... Take 1 tablet by mouth once a day 4)  Lovastatin 40 Mg Tabs (Lovastatin) .... Take 1 tablet by mouth once a day 5)  Verapamil Hcl Cr 240 Mg Tbcr (Verapamil hcl) .... Take 1 tablet by mouth once daily 6)  Multivitamins Tabs (Multiple vitamin) .... Take 1 tablet by mouth once a day 7)  Benadryl 25 Mg Caps (Diphenhydramine hcl) .... Take 2 tab by mouth at bedtime 8)  Vitamin D 400 Unit Caps (Cholecalciferol) .... Take 1 tablet by mouth once a day 9)  Methocarbamol 750 Mg Tabs (Methocarbamol) .... As  directed as needed 10)  Advil 200 Mg Tabs (Ibuprofen) .Marland Kitchen.. 1 -2 daily as needed for knee and back pain.

## 2010-04-29 NOTE — Miscellaneous (Signed)
Summary: BONE DENSITY  Clinical Lists Changes  Orders: Added new Test order of T-Bone Densitometry (77080) - Signed Added new Test order of T-Lumbar Vertebral Assessment (77082) - Signed 

## 2010-04-29 NOTE — Miscellaneous (Signed)
Summary: Orders Update pft charges  Clinical Lists Changes  Orders: Added new Service order of Carbon Monoxide diffusing w/capacity (94720) - Signed Added new Service order of Lung Volumes (94240) - Signed Added new Service order of Spirometry (Pre & Post) (94060) - Signed 

## 2010-04-29 NOTE — Letter (Signed)
   [facility] [address] [place], [place]  [address] Phone: [phone]      December 22, 2009   Renee Donovan [address] [place], [place] [address]  RE:  LAB RESULTS  Dear  [name],  The following is an interpretation of your most recent lab tests.  Please take note of any instructions provided or changes to medications that have resulted from your lab work.   Pulmonary function study did reveal moderate COPD without much response to bronchodilators. There is severe reduction in diffusion capacity consistent with very significant emphysema.    Please come see me if you have any questions about these lab results.   Sincerely Yours,    Jacques Navy MD  Appended Document:  Cardiac stress test was normal

## 2010-04-29 NOTE — Assessment & Plan Note (Signed)
Summary: Cardiology Nuclear Testing  Nuclear Med Background Indications for Stress Test: Evaluation for Ischemia, Abnormal EKG   History: COPD, Emphysema, GXT   Symptoms: DOE, Fatigue    Nuclear Pre-Procedure Cardiac Risk Factors: Family History - CAD, Hypertension, Lipids, Obesity, Smoker Caffeine/Decaff Intake: None NPO After: 11:00 PM Lungs: Diminished throughout, no wheezes.  O2 Sat 95% on RA. IV 0.9% NS with Angio Cath: 20g     IV Site: L Hand IV Started by: Irean Hong, RN Chest Size (in) 40     Cup Size D     Height (in): 64 Weight (lb): 185 BMI: 31.87  Nuclear Med Study 1 or 2 day study:  1 day     Stress Test Type:  Eugenie Birks Reading MD:  Marca Ancona, MD     Referring MD:  [name], MD Resting Radionuclide:  Technetium 33m Tetrofosmin     Resting Radionuclide Dose:  10.8 mCi  Stress Radionuclide:  Technetium 29m Tetrofosmin     Stress Radionuclide Dose:  33 mCi   Stress Protocol   Lexiscan: 0.4 mg   Stress Test Technologist:  Rea [employer], CMA-N     Nuclear Technologist:  Domenic Polite, CNMT  Rest Procedure  Myocardial perfusion imaging was performed at rest 45 minutes following the intravenous administration of Technetium 67m Tetrofosmin.  Stress Procedure  The patient received IV Lexiscan 0.4 mg over 15-seconds.  Technetium 37m Tetrofosmin injected at 30-seconds.  There were no significant changes with infusion.  Patient was hypertensive, most likely due to the fact that she did not take any of her medications this a.m.  Quantitative spect images were obtained after a 45 minute delay.  QPS Raw Data Images:  There is a breast shadow that accounts for the anterior attenuation. Stress Images:  Normal homogeneous uptake in all areas of the myocardium. Rest Images:  Normal homogeneous uptake in all areas of the myocardium. Subtraction (SDS):  There is no evidence of scar or ischemia. Transient Ischemic Dilatation:  1.14  (Normal <1.22)  Lung/Heart  Ratio:  0.28  (Normal <0.45)  Quantitative Gated Spect Images QGS EDV:  62 ml QGS ESV:  18 ml QGS EF:  72 % QGS cine images:  Normal wall motion.    Overall Impression  Exercise Capacity: Lexiscan with no exercise. BP Response: Hypertensive blood pressure response. Clinical Symptoms: Short of breath.  ECG Impression: No significant ST segment change suggestive of ischemia. Overall Impression: Normal stress nuclear study.

## 2010-04-29 NOTE — Letter (Signed)
   [facility] [address] [place], [place]  [address] Phone: [phone]      January 25, 2010   Renee Donovan [address] [place], [place] [address]  RE:  LAB RESULTS  Dear  [name],  The following is an interpretation of your most recent lab tests.  Please take note of any instructions provided or changes to medications that have resulted from your lab work.   Your bone density study reveals osteopenia of the spine and hips.   Please schedule an appointment to see me to discuss medical therapy to enhance your bone density.   Sincerely Yours,    Jacques Navy MD

## 2010-06-03 ENCOUNTER — Telehealth (INDEPENDENT_AMBULATORY_CARE_PROVIDER_SITE_OTHER): Payer: Self-pay | Admitting: *Deleted

## 2010-06-08 NOTE — Progress Notes (Signed)
Summary: Ventolin RX --- refill x1 only  Phone Note Call from Patient Call back at Home Phone [phone]   Caller: Patient Call For: WRIGHT Summary of Call: Patient phoned stated that she wants to chnage one of her inhailers. She is currently using ProAir and it doesnt work doesnt like any part of it she wants to go back to Ventolin which he gave her a sample of. She uses [facility] on [address]. She can be reached at [phone] Initial call taken by: Vedia Coffer,  June 03, 2010 11:45 AM  Follow-up for Phone Call        Pt states Rennis Golden is not working for her and was given a sample of the Ventolin at last OV. Pt is due for OV and says she will have to callback to schedule this once  financial situation is a little better. Pt aware we will send RX for Ventolin x1 only. She will need OV for further refills. Follow-up by: Michel Bickers CMA,  June 03, 2010 12:13 PM    New/Updated Medications: VENTOLIN HFA 108 (90 BASE) MCG/ACT  AERS (ALBUTEROL SULFATE) 1-2 puffs every 4-6 hours as needed PATIENT REQUEST DELIVERY Prescriptions: VENTOLIN HFA 108 (90 BASE) MCG/ACT  AERS (ALBUTEROL SULFATE) 1-2 puffs every 4-6 hours as needed PATIENT REQUEST DELIVERY  #1 x 0   Entered by:   Michel Bickers CMA   Authorized by:   Storm Frisk MD   Signed by:   Michel Bickers CMA on 06/03/2010   Method used:   Electronically to        [id number] (retail)       [address]       [place], [place]  [address]       Ph: [phone]       Fax: [phone]   RxID:   [id number]

## 2010-08-10 ENCOUNTER — Other Ambulatory Visit: Payer: Self-pay | Admitting: Internal Medicine

## 2010-08-10 DIAGNOSIS — Z1231 Encounter for screening mammogram for malignant neoplasm of breast: Secondary | ICD-10-CM

## 2010-08-10 NOTE — Assessment & Plan Note (Signed)
[facility]                           PRIMARY CARE OFFICE NOTE   CITLALLY, CAPTAIN                   MRN:          [id number]  DATE:08/03/2006                            DOB:          [birth year]    [name] is a delightful 77 year old woman who presents for a  followup evaluation and exam.  She was last seen in the office July 05, 2005--please see that dictation-- and at that time she was doing well.   The patient reports that she is still smoking, although she has  reconnected with Quit Now.  She has no other complaints or problems.   PAST MEDICAL HISTORY:   SURGICAL:  1. Benign tumor removed from the breast at 67.  2. Excision of a breast lump on the right in 1992.  3. Cosmetic nose surgery and chin lift in 1993.  4. Surgery on her 5th toe in the past.   MEDICAL ILLNESSES:  1. Usual childhood disease including whooping cough.  2. Hypertension.  3. Breast cancer on the right, status post lumpectomy followed by      radiation therapy and consolidation with tamoxifen.  4. Hyperlipidemia.   CURRENT MEDICATIONS:  1. Clonidine 0.1 mg b.i.d.  2. Verapamil 240 mg b.i.d.  3. Synthroid 50 mcg daily.  4. Calcium 600 mg b.i.d.  5. Lovastatin 40 mg q.p.m.  6. Lisinopril 20/12.5 once daily.  7. Vitamin D 400 international units b.i.d. started at today's visit.  8. Multivitamins daily.   FAMILY HISTORY:  Noncontributory.   SOCIAL HISTORY:  The patient was married 26 years, but has been divorced  since 68.  She has a grown son and 3 adopted daughters.  She has 2  grandchildren.  The patient has a close circle of friends.  The patient  lived in [place] [address] and had generally been a girl about  town, although she admits that her lifestyle has slowed a little bit at  this point.   HEALTH MAINTENANCE:  The patient's last mammogram was July 25, 2006 and  was read out as a negative study.  The patient's last colonoscopy  was  November 12, 2002 and was a normal study with followup recommended in  2014.  The patient had a pneumonia vaccine, November 1999.   REVIEW OF SYSTEMS:  The patient has had no constitutional, ENT,  cardiovascular, respiratory, GI, GU or musculoskeletal problems.   PHYSICAL EXAMINATION:  Temperature was 97.2, blood pressure 144/74,  pulse was 60.  Weight 182.  GENERAL APPEARANCE:  This is a well-nourished, well-developed,  overweight Caucasian woman looking younger than her stated chronologic  age, in no acute distress.  HEENT:  Normocephalic, atraumatic.  EACs and TMs were unremarkable.  The  patient does have surgical scars in the periauricular region.  Oropharynx with no lesions.  Posterior pharynx was clear.  Conjunctivae  and sclerae were clear.  Pupils were equal, round and reactive to light  and accommodation.  Funduscopic exam was unremarkable.  NECK:  Supple.  There was an easily palpable thyroid, normal in size  with no nodules, no tenderness.  NODES:  No adenopathy was noted in the cervical or supraclavicular  regions.  CHEST:  No deformities were noted.  LUNGS:  Clear with no rales, wheezes or rhonchi.  BREASTS:  Left breast with normal skin, nipple without discharge, no  fixed mass lesion or abnormality was noted and the axilla was clear.  Right breast with significant changes from lumpectomy with a tuck at the  10 o'clock position.  Tissue is firm, status post radiation therapy.  No  nipple discharge is noted.  Skin appears normal.  The axilla, although  somewhat deformed, is unremarkable with no adenopathy.  CARDIOVASCULAR:  Radial pulse 2+.  No JVD or carotid bruits.  She had a  quiet precordium with a regular rate and rhythm, without murmurs, rubs,  or gallops.  ABDOMEN:  Soft.  No guarding or rebound.  No organosplenomegaly was  noted.  PELVIC:  Deferred to patient being over 40 with normal GYN history, with  the last pelvic and Pap smear in our office in 2003,  which was normal.  RECTAL:  Deferred with colonoscopy as noted.  EXTREMITIES:  Without clubbing, cyanosis, edema or deformity.  NEUROLOGIC:  Exam was nonfocal.  SKIN:  Dry, but otherwise unremarkable.   LABORATORY DATA:  Sodium and potassium were normal.  Blood sugar was  102.  Kidney function was normal with a creatinine of 1.1.  Liver  functions were normal.  TSH was normal at 4.44.  Cholesterol was 171  with triglycerides of 210; HDL was 38.1; LDL cholesterol was 96.   Chest x-ray reveals stable nodular density of the right base that has  been stable over the last 2 years with no significant change.   ASSESSMENT AND PLAN:  1. Hypertension:  The patient's blood pressure is adequately      controlled on her present medications; she will continue the same.  2. Hypothyroid disease:  The patient's TSH is stable and normal on her      present medications; she will continue the same.  3. Hyperlipidemia:  Good control on her present medications and the      patient will continue the same.  4. Health maintenance:  The patient is current and up to date with      colorectal cancer screening, mammography and laboratories as noted.   In summary, this is a very pleasant woman who seems to be doing  medically very well.  She was given immunizations today including a  tetanus booster and Zostavax.   The patient is encouraged to continue her smoking cessation efforts with  1-800-QUIT-NOW and I would be happy to provide medications on an as-  needed basis to assist her in her smoking cessation program.   The patient is asked to return to see me on a p.r.n. basis or in 1 year.     Rosalyn Gess Norins, MD  Electronically Signed    MEN/MedQ  DD: 08/03/2006  DT: 08/04/2006  Job #: [id number]   cc:   [address] [place], [place] [address] Tenna Child Jennye Moccasin

## 2010-09-14 ENCOUNTER — Emergency Department (HOSPITAL_COMMUNITY)
Admission: EM | Admit: 2010-09-14 | Discharge: 2010-09-14 | Disposition: A | Discharge status: Home / Self Care | Payer: Medicare Other | Attending: Emergency Medicine | Admitting: Emergency Medicine

## 2010-09-14 DIAGNOSIS — E7800 Pure hypercholesterolemia, unspecified: Secondary | ICD-10-CM | POA: Insufficient documentation

## 2010-09-14 DIAGNOSIS — R040 Epistaxis: Secondary | ICD-10-CM | POA: Insufficient documentation

## 2010-09-14 DIAGNOSIS — I10 Essential (primary) hypertension: Secondary | ICD-10-CM | POA: Insufficient documentation

## 2010-09-14 DIAGNOSIS — J4489 Other specified chronic obstructive pulmonary disease: Secondary | ICD-10-CM | POA: Insufficient documentation

## 2010-09-14 DIAGNOSIS — F17200 Nicotine dependence, unspecified, uncomplicated: Secondary | ICD-10-CM | POA: Insufficient documentation

## 2010-09-14 DIAGNOSIS — J449 Chronic obstructive pulmonary disease, unspecified: Secondary | ICD-10-CM | POA: Insufficient documentation

## 2010-09-14 LAB — CBC
HCT: 42 % (ref 36.0–46.0)
MCH: 32.1 pg (ref 26.0–34.0)
MCV: 92.3 fL (ref 78.0–100.0)
RBC: 4.55 MIL/uL (ref 3.87–5.11)

## 2010-09-14 LAB — DIFFERENTIAL
Basophils Absolute: 0.1 10*3/uL (ref 0.0–0.1)
Eosinophils Relative: 3 % (ref 0–5)
Lymphocytes Relative: 33 % (ref 12–46)
Lymphs Abs: 3.3 10*3/uL (ref 0.7–4.0)
Monocytes Relative: 7 % (ref 3–12)
Neutrophils Relative %: 57 % (ref 43–77)

## 2010-09-21 ENCOUNTER — Ambulatory Visit: Payer: Self-pay

## 2010-10-01 ENCOUNTER — Encounter: Payer: Self-pay | Admitting: Pulmonary Disease

## 2010-10-04 ENCOUNTER — Ambulatory Visit: Payer: Self-pay | Admitting: Pulmonary Disease

## 2010-10-06 ENCOUNTER — Encounter: Payer: Self-pay | Admitting: Critical Care Medicine

## 2010-10-06 ENCOUNTER — Ambulatory Visit (INDEPENDENT_AMBULATORY_CARE_PROVIDER_SITE_OTHER): Payer: Medicare Other | Admitting: Critical Care Medicine

## 2010-10-06 VITALS — BP 140/82 | HR 75 | Temp 98.4°F | Ht 66.0 in | Wt 183.4 lb

## 2010-10-06 DIAGNOSIS — J438 Other emphysema: Secondary | ICD-10-CM

## 2010-10-06 MED ORDER — ARFORMOTEROL TARTRATE 15 MCG/2ML IN NEBU: 15.0000 ug | INHALATION_SOLUTION | Freq: Two times a day (BID) | RESPIRATORY_TRACT | Status: DC

## 2010-10-06 MED ORDER — PREDNISONE 10 MG PO TABS: ORAL_TABLET | ORAL | Status: DC

## 2010-10-06 MED ORDER — BUDESONIDE 0.25 MG/2ML IN SUSP: 0.2500 mg | Freq: Every day | RESPIRATORY_TRACT | Status: DC

## 2010-10-06 MED ORDER — ALBUTEROL SULFATE (2.5 MG/3ML) 0.083% IN NEBU: 2.5000 mg | INHALATION_SOLUTION | Freq: Four times a day (QID) | RESPIRATORY_TRACT | Status: DC | PRN

## 2010-10-06 NOTE — Patient Instructions (Signed)
Stop Spiriva Start Budesonide and Brovana twice daily in nebulizer and albuterol in nebulizer every 6 hours as needed Prednisone 10mg  Take 4 for three days, 3 for three days, 2 for three days, 1 for three days and stop An overnight sleep oximetry will be obtained A Home health RN will be ordered Focus on smoking cessation Return 2 months

## 2010-10-06 NOTE — Progress Notes (Signed)
Subjective:    Patient ID: Renee Donovan, female    DOB: Dec 04, 1930, 77 y.o.   MRN: [id number]  HPI This is a 77 y.o.  WF with Golds IV COPD Previously seen 2009: data from  Before as follows:  CXR: Procedure date: 11/24/2008  Findings:  IMPRESSION:  No active disease. No significant change.  Pulmonary Function Test  Date: 12/03/2009  Gender: Female  Pre-Spirometry  FVC Value: 2.32 L/min Pred: 2.89 L/min % Pred: 80 %  FEV1 Value: 1.26 L Pred: 2.02 L % Pred: 63 %  FEV1/FVC Value: 55 % Pred: 70 % FEF 25-75 Value: 0.44 L/min Pred: 2.20 L/min % Pred: 20 %  Post-Spirometry  FVC Value: 2.63 L/min Pred: 2.89 L/min % Pred: 91 %  FEV1 Value: 1.34 L Pred: 2.02 L % Pred: 66 %  FEV1/FVC Value: 51 % Pred: 70 % FEF 25-75 Value: 0.35 L/min Pred: 2.20 L/min % Pred: 16 %  Lung Volumes  TLC Value: 5.16 L % Pred: 107 %  RV Value: 3.21 L % Pred: 149 %  DLCO Value: 9.5 % % Pred: 44 %  DLCO/VA Value: 2.61 % % Pred: 78 %  Comments:  Moderate obstructive lung disease, airtrapping on lung volumes, severe reduction in dlco due to emphysematous changes   10/06/2010  Worse over the past month. In ED twice for epistaxsis,  Also fell twice.  No fractures. Fell once in bedroom and kitchen second time.  Using the rollator more.  Notes no real cough.   More dyspneic.  No real chest pain.  Notes some wheeze  Past Medical History  Diagnosis Date  . Breast cancer 1991    s/p R lumpectomy and radiation   . Hyperlipidemia   . Hypertension   . DDD (degenerative disc disease)   . DJD (degenerative joint disease)     ankles  . Cluster headache     remote  . COPD (chronic obstructive pulmonary disease)     Golds Stage II feV1 63% 11/2009     Family History  Problem Relation Age of Onset  . Coronary artery disease Father   . Heart attack Father   . Diabetes Father   . Breast cancer Mother      History   Social History  . Marital Status: Divorced    Spouse Name: N/A    Number of Children: 4  .  Years of Education: N/A   Occupational History  . retired     Art therapist, Audiological scientist estate   Social History Main Topics  . Smoking status: Current Everyday Smoker -- 1.0 packs/day for 60 years    Types: Cigarettes  . Smokeless tobacco: Never Used  . Alcohol Use: Not on file  . Drug Use: Not on file  . Sexually Active: Not on file   Other Topics Concern  . Not on file   Social History Narrative   HSG, Secretarial college x 1 yearMarried 1956- 3 years, divorced 40- 26 years- divorced1 son- 32, 3 adopted daughter 35, 74, 2: 2 grandchildrenWork- IT consultant, property Insurance account manager, real estate, retired$$- tightLives alone- house owned/paid for. Son lives nearbyLiving will- wants to full codeCurrent smoker 1 ppd x 60 years4 glasses of wine daily.     No Known Allergies   Outpatient Prescriptions Prior to Visit  Medication Sig Dispense Refill  . albuterol (VENTOLIN HFA) 108 (90 BASE) MCG/ACT inhaler Inhale 2 puffs into the lungs every 6 (six) hours as needed.        . cloNIDine (  CATAPRES) 0.1 MG tablet Take 0.1 mg by mouth 2 (two) times daily.        . diphenhydrAMINE (BENADRYL) 25 MG tablet Take 50 mg by mouth at bedtime.        Marland Kitchen ibuprofen (ADVIL,MOTRIN) 200 MG tablet Take 200 mg by mouth every 6 (six) hours as needed.        Marland Kitchen levothyroxine (SYNTHROID, LEVOTHROID) 50 MCG tablet Take 50 mcg by mouth daily.        Marland Kitchen lisinopril-hydrochlorothiazide (PRINZIDE,ZESTORETIC) 20-12.5 MG per tablet Take 1 tablet by mouth daily.        Marland Kitchen lovastatin (MEVACOR) 40 MG tablet Take 40 mg by mouth daily.        . methocarbamol (ROBAXIN) 750 MG tablet Take 750 mg by mouth as needed.        . verapamil (CALAN-SR) 240 MG CR tablet Take 240 mg by mouth 2 (two) times daily.        Marland Kitchen tiotropium (SPIRIVA) 18 MCG inhalation capsule Place 18 mcg into inhaler and inhale daily.        . Multiple Vitamin (MULTIVITAMIN) capsule Take 1 capsule by mouth daily.        . vitamin E 400 UNIT capsule Take 400  Units by mouth daily.            Review of Systems Constitutional:   No  weight loss, night sweats,  Fevers, chills,+  Fatigue,+  lassitude. HEENT:   No headaches,  Difficulty swallowing,  Tooth/dental problems,  Sore throat,                No sneezing, itching, ear ache, nasal congestion, post nasal drip,   CV:  No chest pain,  Orthopnea, PND, swelling in lower extremities, anasarca, dizziness, palpitations  GI  + heartburn,+  indigestion, abdominal pain, nausea, vomiting, diarrhea, change in bowel habits, loss of appetite  Resp: Notes  shortness of breath with exertion ,  Not  at rest.  No excess mucus, no productive cough,  No non-productive cough,  No coughing up of blood.  No change in color of mucus.  No wheezing.  No chest wall deformity  Skin: no rash or lesions.  GU: no dysuria, change in color of urine, no urgency or frequency.  No flank pain.  MS:  No joint pain or swelling.  No decreased range of motion.  No back pain.  Psych:  No change in mood or affect. No depression or anxiety.  No memory loss.     Objective:   Physical Exam Filed Vitals:   10/06/10 1009  BP: 140/82  Pulse: 75  Temp: 98.4 F (36.9 C)  TempSrc: Oral  Height: 5\' 6"  (1.676 m)  Weight: 183 lb 6.4 oz (83.19 kg)  SpO2: 94%    Gen: Pleasant, well-nourished, in no distress,  normal affect  ENT: No lesions,  mouth clear,  oropharynx clear, no postnasal drip  Neck: No JVD, no TMG, no carotid bruits  Lungs: No use of accessory muscles, no dullness to percussion, distant BS  Cardiovascular: RRR, heart sounds normal, no murmur or gallops, no peripheral edema  Abdomen: soft and NT, no HSM,  BS normal  Musculoskeletal: No deformities, no cyanosis or clubbing  Neuro: alert, non focal  Skin: Warm, no lesions or rashes        Assessment & Plan:

## 2010-10-07 NOTE — Assessment & Plan Note (Addendum)
Golds IV Copd Primary emphysema Ineffective breathhold with dpi inhalers ?overnight oxygen desat  plan Stop Spiriva Start Budesonide and Brovana twice daily in nebulizer and albuterol in nebulizer every 6 hours as needed Prednisone 10mg  Take 4 for three days, 3 for three days, 2 for three days, 1 for three days and stop An overnight sleep oximetry will be obtained A Home health RN will be ordered Focus on smoking cessation Return 2 months

## 2010-10-11 ENCOUNTER — Telehealth: Payer: Self-pay

## 2010-10-11 NOTE — Telephone Encounter (Signed)
Kelly from [facility] called to advise Dr. Debby Bud of her visit with the pt this morning. Tresa Endo notes when she checked pt's BP it was 210/120 and pt told her that she had not taken her medication for a couple of days. Nurse says that pt denied abnormal weakness, dizziness, HA, and visual changes. Pt took meds while nurse was there and nurse re-checked BP in 30 mins, at which time it was 190/104. Nurse states that pt was having her morning cigarette and morning cup of wine but had not taken meds. Per nurse, she advised pt to not smoke for a while and to lie down for a while. Also advised pt to dial 911 immediately if she begins to experience any sxs listed above. Nurse Tresa Endo said she will check on pt again in about an hour and would like to know if there is anything that pcp would like for her to do. Please advise

## 2010-10-11 NOTE — Telephone Encounter (Signed)
Nurse and pt notified. Nurse notes that when she went back to recheck pt's BP it was 144/78. Pt states that she will take her medication as instructed and will be sure to come in for appointment October 21, 2010

## 2010-10-11 NOTE — Telephone Encounter (Signed)
Morning cigarette and glass of wine!!! 1. Patient is to take her medications as instructed. 2. Needs follow-up OV

## 2010-10-14 ENCOUNTER — Telehealth: Payer: Self-pay | Admitting: *Deleted

## 2010-10-14 NOTE — Telephone Encounter (Signed)
Caller states that Pt is noncompliant on medications [Clonidine & Verapamil] "bottles are dated 11/2009 Clarification on instructions: BID BP readings: 190/102, 180/90, 192/92, 200/102 Pt has No symptoms of: headache, dizziness, blurred vision, nausea & vomiting, chest pain and/or SOB Home Health will monitor and update.

## 2010-10-15 ENCOUNTER — Ambulatory Visit: Payer: Medicare Other | Admitting: Internal Medicine

## 2010-10-15 NOTE — Telephone Encounter (Signed)
Medication adherence is a problem! Also , wine for breakfast is a problem as is the accompanying cigarette. Agree with HH monitoring her.

## 2010-10-20 ENCOUNTER — Telehealth: Payer: Self-pay | Admitting: Critical Care Medicine

## 2010-10-20 NOTE — Telephone Encounter (Signed)
Call pt and  Tell her ONO is normal, no oxygen needed

## 2010-10-21 ENCOUNTER — Encounter: Payer: Self-pay | Admitting: Internal Medicine

## 2010-10-21 ENCOUNTER — Telehealth: Payer: Self-pay | Admitting: *Deleted

## 2010-10-21 ENCOUNTER — Ambulatory Visit (INDEPENDENT_AMBULATORY_CARE_PROVIDER_SITE_OTHER): Payer: Medicare Other | Admitting: Internal Medicine

## 2010-10-21 ENCOUNTER — Other Ambulatory Visit (INDEPENDENT_AMBULATORY_CARE_PROVIDER_SITE_OTHER): Payer: Medicare Other

## 2010-10-21 VITALS — BP 120/64 | HR 56 | Temp 99.4°F

## 2010-10-21 DIAGNOSIS — R5381 Other malaise: Secondary | ICD-10-CM

## 2010-10-21 DIAGNOSIS — F17200 Nicotine dependence, unspecified, uncomplicated: Secondary | ICD-10-CM

## 2010-10-21 DIAGNOSIS — Z79899 Other long term (current) drug therapy: Secondary | ICD-10-CM

## 2010-10-21 DIAGNOSIS — I10 Essential (primary) hypertension: Secondary | ICD-10-CM

## 2010-10-21 DIAGNOSIS — R29898 Other symptoms and signs involving the musculoskeletal system: Secondary | ICD-10-CM

## 2010-10-21 DIAGNOSIS — R5383 Other fatigue: Secondary | ICD-10-CM

## 2010-10-21 DIAGNOSIS — I739 Peripheral vascular disease, unspecified: Secondary | ICD-10-CM

## 2010-10-21 DIAGNOSIS — E785 Hyperlipidemia, unspecified: Secondary | ICD-10-CM

## 2010-10-21 LAB — COMPREHENSIVE METABOLIC PANEL
Albumin: 3.5 g/dL (ref 3.5–5.2)
Alkaline Phosphatase: 80 U/L (ref 39–117)
BUN: 16 mg/dL (ref 6–23)
CO2: 28 mEq/L (ref 19–32)
GFR: 44.82 mL/min — ABNORMAL LOW (ref >60.00)
Glucose, Bld: 97 mg/dL (ref 70–99)
Potassium: 4.1 mEq/L (ref 3.5–5.1)
Sodium: 134 mEq/L — ABNORMAL LOW (ref 135–145)
Total Protein: 6.7 g/dL (ref 6.0–8.3)

## 2010-10-21 NOTE — Telephone Encounter (Signed)
Called, spoke with pt.  She is aware ONO is normal and no oxygen needed per PW.  She verbalized understanding of this and voiced no further questions/concerns at this time.

## 2010-10-21 NOTE — Telephone Encounter (Signed)
Referral needed to be changed/updated - done

## 2010-10-21 NOTE — Progress Notes (Signed)
Subjective:    Patient ID: Renee Donovan, female    DOB: 11/24/1930, 77 y.o.   MRN: [id number]  HPI Ms. Wolbert presents for follow-up of very high Blood pressure readings. It turns out that she wasn't taking her medications at all and when she did take them she was only taking 1/2 of the prescribed dose. Today, on her medications as instructed her BP is well controlled.   She has been very low on energy. She did have a normal Hgb in June. She has had overnight oximetry ordered by Dr. Delford Field that was OK and she did not require home oxygen. She has not had a thyroid level in over a year. She does not have any cardiac symptoms.  Past Medical History  Diagnosis Date  . Breast cancer 1991    s/p R lumpectomy and radiation   . Hyperlipidemia   . Hypertension   . DDD (degenerative disc disease)   . DJD (degenerative joint disease)     ankles  . Cluster headache     remote  . COPD (chronic obstructive pulmonary disease)     Golds Stage II feV1 63% 11/2009   Past Surgical History  Procedure Date  . Breast lumpectomy 1991    Right breast  . Nose surgery 1993  . Chin lift [id number]  . Toe surgery 1965    5th toe- right foot   Family History  Problem Relation Age of Onset  . Coronary artery disease Father   . Heart attack Father   . Diabetes Father   . Breast cancer Mother    History   Social History  . Marital Status: Divorced    Spouse Name: N/A    Number of Children: 4  . Years of Education: N/A   Occupational History  . retired     Art therapist, Audiological scientist estate   Social History Main Topics  . Smoking status: Current Everyday Smoker -- 1.0 packs/day for 60 years    Types: Cigarettes  . Smokeless tobacco: Never Used  . Alcohol Use: Not on file     4 glasses of wine daily  . Drug Use: Not on file  . Sexually Active: Not on file   Other Topics Concern  . Not on file   Social History Narrative   HSG, Secretarial college x 1 yearMarried 1956- 3 years,  divorced 37- 26 years- divorced1 son- 68, 3 adopted daughter 17, 60, 65: 2 grandchildrenWork- IT consultant, property Insurance account manager, real estate, retired$$- tightLives alone- house owned/paid for. Son lives nearbyLiving will- wants to full codeCurrent smoker 1 ppd x 60 years4 glasses of wine daily.       Review of Systems Review of Systems  Constitutional:  Negative for fever, chills, activity change and unexpected weight change.  HEENT:  Negative for hearing loss, ear pain, congestion, neck stiffness and postnasal drip. Negative for sore throat or swallowing problems. Negative for dental complaints.   Eyes: Negative for vision loss or change in visual acuity.  Respiratory: Negative for chest tightness and wheezing.   Cardiovascular: Negative for chest pain and palpitation. decreased exercise tolerance and leg weakness in setting of poor circulation. Gastrointestinal: No change in bowel habit. No bloating or gas. No reflux or indigestion Genitourinary: Negative for urgency, frequency, flank pain and difficulty urinating.  Musculoskeletal: Negative for myalgias, back pain, arthralgias and gait problem.  Neurological: Negative for dizziness, tremors, weakness and headaches.  Hematological: Negative for adenopathy.  Psychiatric/Behavioral: Negative for behavioral problems and dysphoric mood.  Objective:   Physical Exam Vitals noted with BP being excellent Gen'l - WNWD white woman who looks her stated age. HEENT - [place]/AT, C&^S clear Chest - decreased breath sounds, decreased diaphragmatic excursion, no wheezing or rales Cor - RRR, 2+ radial, 1+ DP pulse sitting Neuro - non focal.        Assessment & Plan:

## 2010-10-22 ENCOUNTER — Encounter: Payer: Self-pay | Admitting: Internal Medicine

## 2010-10-22 NOTE — Assessment & Plan Note (Signed)
Reviewed risks and consequences of smoking in a patient with COPD including disability and slow death. Adamantly encouraged her to stop smoking. She is recalcitrant with no intention to quit.

## 2010-10-22 NOTE — Assessment & Plan Note (Signed)
Reports of very high BP at home per home health RN. Patient admits to either not taking her medication or taking it incorrectly. For the past several days she has been taking her medication as instructed and her BP is well controlled.  Plan - continue present medications.           Routine lab follow-up.

## 2010-10-22 NOTE — Assessment & Plan Note (Signed)
For lab today with recommendations to follow  

## 2010-10-25 ENCOUNTER — Other Ambulatory Visit: Payer: Self-pay | Admitting: Critical Care Medicine

## 2010-10-26 ENCOUNTER — Telehealth: Payer: Self-pay | Admitting: *Deleted

## 2010-10-26 ENCOUNTER — Telehealth: Payer: Self-pay | Admitting: Critical Care Medicine

## 2010-10-26 DIAGNOSIS — E538 Deficiency of other specified B group vitamins: Secondary | ICD-10-CM

## 2010-10-26 DIAGNOSIS — J438 Other emphysema: Secondary | ICD-10-CM

## 2010-10-26 NOTE — Telephone Encounter (Signed)
Called spoke with patient, advised of options as stated by PEW.  Pt reports that she does not want to stop the brovana and budesonide, but is willing to only take the albuterol qid scheduled until she can refill her neb meds on 8.7.12.  Pt requesting the albuterol script be rewritten for a full 30 day supply.  She is aware PEW not in office and is okay with call back tomorrow.  PEW please advise, thanks.

## 2010-10-26 NOTE — Telephone Encounter (Signed)
[place] or [place], I do not think that we provide pt assistance forms for budesonide, brovana or albuterol, but want to double check with you guys. Please advise if there is any type of assistance we can offer this pt on these meds, thanks!

## 2010-10-26 NOTE — Telephone Encounter (Signed)
Ask PCCs if pt can qualify for any patient medication assistance programs

## 2010-10-26 NOTE — Telephone Encounter (Signed)
Patient requesting results of labs. Letter was mailed, need to call and inform pt of results from letter.

## 2010-10-26 NOTE — Telephone Encounter (Signed)
You are right there are no assistance for these meds sorry

## 2010-10-26 NOTE — Telephone Encounter (Signed)
Dr Delford Field, pls advise if you want to change any of her meds, being that we do not have any source of pt assistance for them, thanks!

## 2010-10-26 NOTE — Telephone Encounter (Signed)
Returned call to patient and advised per MD. Per pt she is homebound and unable to come in for nurse visits. I told pt that we will need to research what needed as far as home health and call her back. [facility] has used [facility] in [facility] past but they no longer come out

## 2010-10-26 NOTE — Telephone Encounter (Signed)
No, all of her pulm meds will be expensive,  Only option is to stop brovana and budesonide and go just with albuterol in nebulizer qid

## 2010-10-26 NOTE — Telephone Encounter (Signed)
Spoke with patient-states that her pulmonary meds from [facility] is costing her approx $600 a week; she can not afford this and would like to have recs from [facility] on what other tx plans she can do. I have left a sample of Brovana and Ventolin inhaler at front for pt to have someone pick up for her as she is homebound.

## 2010-10-27 DIAGNOSIS — E538 Deficiency of other specified B group vitamins: Secondary | ICD-10-CM | POA: Insufficient documentation

## 2010-10-27 NOTE — Telephone Encounter (Signed)
Ok to give 30day of albuterol qid

## 2010-10-27 NOTE — Telephone Encounter (Signed)
[facility] pt, line busy x2.

## 2010-10-27 NOTE — Telephone Encounter (Signed)
Pt aware samples up front for pickup. 

## 2010-10-27 NOTE — Telephone Encounter (Signed)
Needs Home health referral. [facility] notified

## 2010-10-27 NOTE — Telephone Encounter (Signed)
Patient informed. 

## 2010-11-03 ENCOUNTER — Encounter (INDEPENDENT_AMBULATORY_CARE_PROVIDER_SITE_OTHER): Payer: Medicare Other | Admitting: Cardiology

## 2010-11-03 DIAGNOSIS — R29898 Other symptoms and signs involving the musculoskeletal system: Secondary | ICD-10-CM

## 2010-11-03 DIAGNOSIS — I739 Peripheral vascular disease, unspecified: Secondary | ICD-10-CM

## 2010-11-03 DIAGNOSIS — F17200 Nicotine dependence, unspecified, uncomplicated: Secondary | ICD-10-CM

## 2010-11-04 ENCOUNTER — Encounter: Payer: Self-pay | Admitting: Internal Medicine

## 2010-11-10 ENCOUNTER — Telehealth: Payer: Self-pay | Admitting: *Deleted

## 2010-11-10 MED ORDER — CYANOCOBALAMIN 1000 MCG/ML IJ SOLN: 1000.0000 ug | INTRAMUSCULAR | Status: DC

## 2010-11-10 MED ORDER — "SYRINGE/NEEDLE (DISP) 25G X 5/8"" 3 ML MISC": Status: DC

## 2010-11-10 NOTE — Telephone Encounter (Signed)
RX needed for monthly b-12 injections

## 2010-11-15 ENCOUNTER — Telehealth: Payer: Self-pay | Admitting: Internal Medicine

## 2010-11-15 NOTE — Telephone Encounter (Signed)
Study showed normal circulation to both legs. No blockages are evident  Thanks

## 2010-11-15 NOTE — Telephone Encounter (Signed)
Informed pt .

## 2010-11-17 ENCOUNTER — Encounter: Payer: Self-pay | Admitting: Critical Care Medicine

## 2010-12-07 ENCOUNTER — Ambulatory Visit (INDEPENDENT_AMBULATORY_CARE_PROVIDER_SITE_OTHER): Payer: Medicare Other | Admitting: Critical Care Medicine

## 2010-12-07 ENCOUNTER — Encounter: Payer: Self-pay | Admitting: Critical Care Medicine

## 2010-12-07 VITALS — BP 144/80 | HR 56 | Temp 98.0°F | Ht 66.0 in | Wt 180.2 lb

## 2010-12-07 DIAGNOSIS — J439 Emphysema, unspecified: Secondary | ICD-10-CM

## 2010-12-07 DIAGNOSIS — J438 Other emphysema: Secondary | ICD-10-CM

## 2010-12-07 DIAGNOSIS — Z23 Encounter for immunization: Secondary | ICD-10-CM

## 2010-12-07 MED ORDER — ARFORMOTEROL TARTRATE 15 MCG/2ML IN NEBU: 15.0000 ug | INHALATION_SOLUTION | Freq: Every day | RESPIRATORY_TRACT | Status: DC

## 2010-12-07 MED ORDER — BUDESONIDE 0.25 MG/2ML IN SUSP: 0.2500 mg | Freq: Every day | RESPIRATORY_TRACT | Status: DC

## 2010-12-07 NOTE — Patient Instructions (Signed)
Reduce brovana to once daily, when current supply is gone, stop brovana Reduce budesonide to daily Albuterol as needed A manual wheelchair will be obtained Focus on limiting smoking  A flu vaccine will be obtained Return 4 months

## 2010-12-07 NOTE — Assessment & Plan Note (Signed)
Severe golds  stage IV chronic obstructive lung disease with ongoing smoking use Plan Continued smoking cessation Continue budesonide on a daily basis Continued brovana daily until current supply is gone and then stop Resume albuterol on a twice-daily basis

## 2010-12-07 NOTE — Progress Notes (Signed)
Subjective:    Patient ID: Renee Donovan, female    DOB: [birth year], 77 y.o.   MRN: [id number]  HPI  This is a 77 y.o.  WF with Golds IV COPD Previously seen 2009: data from  Before as follows:  CXR: Procedure date: 11/24/2008  Findings:  IMPRESSION:  No active disease. No significant change.  Pulmonary Function Test  Date: 12/03/2009  Gender: Female  Pre-Spirometry  FVC Value: 2.32 L/min Pred: 2.89 L/min % Pred: 80 %  FEV1 Value: 1.26 L Pred: 2.02 L % Pred: 63 %  FEV1/FVC Value: 55 % Pred: 70 % FEF 25-75 Value: 0.44 L/min Pred: 2.20 L/min % Pred: 20 %  Post-Spirometry  FVC Value: 2.63 L/min Pred: 2.89 L/min % Pred: 91 %  FEV1 Value: 1.34 L Pred: 2.02 L % Pred: 66 %  FEV1/FVC Value: 51 % Pred: 70 % FEF 25-75 Value: 0.35 L/min Pred: 2.20 L/min % Pred: 16 %  Lung Volumes  TLC Value: 5.16 L % Pred: 107 %  RV Value: 3.21 L % Pred: 149 %  DLCO Value: 9.5 % % Pred: 44 %  DLCO/VA Value: 2.61 % % Pred: 78 %  Comments:  Moderate obstructive lung disease, airtrapping on lung volumes, severe reduction in dlco due to emphysematous changes   7/11 Worse over the past month. In ED twice for epistaxsis,  Also fell twice.  No fractures. Fell once in bedroom and kitchen second time.  Using the rollator more.  Notes no real cough.   More dyspneic.  No real chest pain.  Notes some wheeze   12/07/2010 The patient overall feels she's been doing well with no change in level of dyspnea. The patient is still actively smoking. There is no chest pain. There is occasional wheezing. The patient is essentially wheelchair-bound. The patient has not been back to the emergency room since her last visit. Patient desires to have her own wheelchair. Past Medical History  Diagnosis Date  . Breast cancer 1991    s/p R lumpectomy and radiation   . Hyperlipidemia   . Hypertension   . DDD (degenerative disc disease)   . DJD (degenerative joint disease)     ankles  . Cluster headache     remote  . COPD  (chronic obstructive pulmonary disease)     Golds Stage II feV1 63% 11/2009     Family History  Problem Relation Age of Onset  . Coronary artery disease Father   . Heart attack Father   . Diabetes Father   . Breast cancer Mother      History   Social History  . Marital Status: Divorced    Spouse Name: N/A    Number of Children: 4  . Years of Education: N/A   Occupational History  . retired     Art therapist, Audiological scientist estate   Social History Main Topics  . Smoking status: Current Everyday Smoker -- 0.5 packs/day for 60 years    Types: Cigarettes  . Smokeless tobacco: Never Used  . Alcohol Use: Not on file     4 glasses of wine daily  . Drug Use: Not on file  . Sexually Active: Not on file   Other Topics Concern  . Not on file   Social History Narrative   HSG, Secretarial college x 1 yearMarried 1956- 3 years, divorced 65- 26 years- divorced1 son- 2, 3 adopted daughter 73, 3, 27: 2 grandchildrenWork- IT consultant, property Insurance account manager, real estate, retired$$- tightLives alone- house owned/paid for. Son  lives nearbyLiving will- wants to full codeCurrent smoker 1 ppd x 60 years4 glasses of wine daily.     No Known Allergies   Outpatient Prescriptions Prior to Visit  Medication Sig Dispense Refill  . albuterol (PROVENTIL) (2.5 MG/3ML) 0.083% nebulizer solution Take 2.5 mg by nebulization every 6 (six) hours as needed for wheezing.  75 mL  6  . Cholecalciferol (VITAMIN D) 400 UNITS capsule Take 400 Units by mouth daily.        . cloNIDine (CATAPRES) 0.1 MG tablet Take 0.1 mg by mouth 2 (two) times daily.        . cyanocobalamin (,VITAMIN B-12,) 1000 MCG/ML injection Inject 1 mL (1,000 mcg total) into the skin every 30 (thirty) days.  10 mL  1  . diphenhydrAMINE (BENADRYL) 25 MG tablet Take 50 mg by mouth at bedtime.        Marland Kitchen ibuprofen (ADVIL,MOTRIN) 200 MG tablet Take 200 mg by mouth every 6 (six) hours as needed.        Marland Kitchen levothyroxine (SYNTHROID, LEVOTHROID)  50 MCG tablet Take 50 mcg by mouth daily.        Marland Kitchen lisinopril-hydrochlorothiazide (PRINZIDE,ZESTORETIC) 20-12.5 MG per tablet Take 1 tablet by mouth daily.        Marland Kitchen lovastatin (MEVACOR) 40 MG tablet Take 40 mg by mouth daily.        . methocarbamol (ROBAXIN) 750 MG tablet Take 750 mg by mouth as needed.        . Multiple Vitamin (MULTIVITAMIN) capsule Take 1 capsule by mouth daily.        . SYRINGE-NEEDLE, DISP, 3 ML (B-D INTEGRA SYRINGE) 25G X 5/8" 3 ML MISC Use with b-12 injections  50 each  0  . VENTOLIN HFA 108 (90 BASE) MCG/ACT inhaler INHALE ONE (1) TO TWO (2) PUFFS EVERY FOUR (4) TO SIX (6) HOURS AS NEEDED  18 g  5  . verapamil (CALAN-SR) 240 MG CR tablet Take 240 mg by mouth 2 (two) times daily.        Marland Kitchen arformoterol (BROVANA) 15 MCG/2ML NEBU Take 2 mLs (15 mcg total) by nebulization 2 (two) times daily.  120 mL  6  . budesonide (PULMICORT) 0.25 MG/2ML nebulizer solution Take 2 mLs (0.25 mg total) by nebulization daily.  60 mL  12  . predniSONE (DELTASONE) 10 MG tablet Take 4 for three days, 3 for three days, 2 for three days, 1 for three days and stop   30 tablet  0      Review of Systems  Constitutional:   No  weight loss, night sweats,  Fevers, chills,+  Fatigue,+  lassitude. HEENT:   No headaches,  Difficulty swallowing,  Tooth/dental problems,  Sore throat,                No sneezing, itching, ear ache, nasal congestion, post nasal drip,   CV:  No chest pain,  Orthopnea, PND, swelling in lower extremities, anasarca, dizziness, palpitations  GI  + heartburn,+  indigestion, abdominal pain, nausea, vomiting, diarrhea, change in bowel habits, loss of appetite  Resp: Notes  shortness of breath with exertion ,  Not  at rest.  No excess mucus, no productive cough,  No non-productive cough,  No coughing up of blood.  No change in color of mucus.  No wheezing.  No chest wall deformity  Skin: no rash or lesions.  GU: no dysuria, change in color of urine, no urgency or frequency.  No  flank pain.  MS:  No joint pain or swelling.  No decreased range of motion.  No back pain.  Psych:  No change in mood or affect. No depression or anxiety.  No memory loss.     Objective:   Physical Exam  Filed Vitals:   12/07/10 1019  BP: 144/80  Pulse: 56  Temp: 98 F (36.7 C)  TempSrc: Oral  Height: 5\' 6"  (1.676 m)  Weight: 180 lb 3.2 oz (81.738 kg)  SpO2: 96%    Gen: Pleasant, well-nourished, in no distress,  normal affect  ENT: No lesions,  mouth clear,  oropharynx clear, no postnasal drip  Neck: No JVD, no TMG, no carotid bruits  Lungs: No use of accessory muscles, no dullness to percussion, distant BS  Cardiovascular: RRR, heart sounds normal, no murmur or gallops, no peripheral edema  Abdomen: soft and NT, no HSM,  BS normal  Musculoskeletal: No deformities, no cyanosis or clubbing  Neuro: alert, non focal  Skin: Warm, no lesions or rashes        Assessment & Plan:   EMPHYSEMA Severe golds  stage IV chronic obstructive lung disease with ongoing smoking use Plan Continued smoking cessation Continue budesonide on a daily basis Continued brovana daily until current supply is gone and then stop Resume albuterol on a twice-daily basis    Updated Medication List Outpatient Encounter Prescriptions as of 12/07/2010  Medication Sig Dispense Refill  . albuterol (PROVENTIL) (2.5 MG/3ML) 0.083% nebulizer solution Take 2.5 mg by nebulization every 6 (six) hours as needed for wheezing.  75 mL  6  . budesonide (PULMICORT) 0.25 MG/2ML nebulizer solution Take 2 mLs (0.25 mg total) by nebulization daily.  60 mL    . Cholecalciferol (VITAMIN D) 400 UNITS capsule Take 400 Units by mouth daily.        . cloNIDine (CATAPRES) 0.1 MG tablet Take 0.1 mg by mouth 2 (two) times daily.        . cyanocobalamin (,VITAMIN B-12,) 1000 MCG/ML injection Inject 1 mL (1,000 mcg total) into the skin every 30 (thirty) days.  10 mL  1  . diphenhydrAMINE (BENADRYL) 25 MG tablet Take 50  mg by mouth at bedtime.        Marland Kitchen ibuprofen (ADVIL,MOTRIN) 200 MG tablet Take 200 mg by mouth every 6 (six) hours as needed.        Marland Kitchen levothyroxine (SYNTHROID, LEVOTHROID) 50 MCG tablet Take 50 mcg by mouth daily.        Marland Kitchen lisinopril-hydrochlorothiazide (PRINZIDE,ZESTORETIC) 20-12.5 MG per tablet Take 1 tablet by mouth daily.        Marland Kitchen lovastatin (MEVACOR) 40 MG tablet Take 40 mg by mouth daily.        . methocarbamol (ROBAXIN) 750 MG tablet Take 750 mg by mouth as needed.        . Multiple Vitamin (MULTIVITAMIN) capsule Take 1 capsule by mouth daily.        . SYRINGE-NEEDLE, DISP, 3 ML (B-D INTEGRA SYRINGE) 25G X 5/8" 3 ML MISC Use with b-12 injections  50 each  0  . VENTOLIN HFA 108 (90 BASE) MCG/ACT inhaler INHALE ONE (1) TO TWO (2) PUFFS EVERY FOUR (4) TO SIX (6) HOURS AS NEEDED  18 g  5  . verapamil (CALAN-SR) 240 MG CR tablet Take 240 mg by mouth 2 (two) times daily.        Marland Kitchen DISCONTD: arformoterol (BROVANA) 15 MCG/2ML NEBU Take 2 mLs (15 mcg total) by nebulization 2 (two) times daily.  120  mL  6  . DISCONTD: budesonide (PULMICORT) 0.25 MG/2ML nebulizer solution Take 2 mLs (0.25 mg total) by nebulization daily.  60 mL  12  . DISCONTD: budesonide (PULMICORT) 0.25 MG/2ML nebulizer solution Take 0.25 mg by nebulization as needed.        Marland Kitchen arformoterol (BROVANA) 15 MCG/2ML NEBU Take 2 mLs (15 mcg total) by nebulization daily. When current supply is gone, stop brovana  120 mL    . DISCONTD: arformoterol (BROVANA) 15 MCG/2ML NEBU Take 15 mcg by nebulization as needed.        Marland Kitchen DISCONTD: predniSONE (DELTASONE) 10 MG tablet Take 4 for three days, 3 for three days, 2 for three days, 1 for three days and stop   30 tablet  0

## 2010-12-23 ENCOUNTER — Other Ambulatory Visit: Payer: Self-pay | Admitting: *Deleted

## 2010-12-23 MED ORDER — VERAPAMIL HCL 240 MG PO TBCR: 240.0000 mg | EXTENDED_RELEASE_TABLET | Freq: Two times a day (BID) | ORAL | Status: DC

## 2010-12-23 MED ORDER — CLONIDINE HCL 0.1 MG PO TABS: 0.1000 mg | ORAL_TABLET | Freq: Two times a day (BID) | ORAL | Status: DC

## 2011-01-11 ENCOUNTER — Telehealth: Payer: Self-pay | Admitting: *Deleted

## 2011-01-11 NOTE — Telephone Encounter (Signed)
[facility]enee [facility] . SHe is wanting a verbal order to continue B12 injections and question if you would like them to dram any labs for pt. Please Advise   

## 2011-01-11 NOTE — Telephone Encounter (Signed)
1. Ok to continue B12 injections monthly 2. Lab - CBC, B12 level

## 2011-01-11 NOTE — Telephone Encounter (Signed)
Renee Donovan called with [facility] . SHe is wanting a verbal order to continue B12 injections and question if you would like them to dram any labs for pt. Please Advise

## 2011-01-12 NOTE — Telephone Encounter (Signed)
Informed kelly

## 2011-01-24 DIAGNOSIS — D510 Vitamin B12 deficiency anemia due to intrinsic factor deficiency: Secondary | ICD-10-CM

## 2011-01-24 DIAGNOSIS — Z5181 Encounter for therapeutic drug level monitoring: Secondary | ICD-10-CM

## 2011-01-24 DIAGNOSIS — J438 Other emphysema: Secondary | ICD-10-CM

## 2011-01-24 DIAGNOSIS — I10 Essential (primary) hypertension: Secondary | ICD-10-CM

## 2011-01-24 DIAGNOSIS — Z853 Personal history of malignant neoplasm of breast: Secondary | ICD-10-CM

## 2011-02-24 DIAGNOSIS — Z5181 Encounter for therapeutic drug level monitoring: Secondary | ICD-10-CM

## 2011-02-24 DIAGNOSIS — J438 Other emphysema: Secondary | ICD-10-CM

## 2011-02-24 DIAGNOSIS — Z853 Personal history of malignant neoplasm of breast: Secondary | ICD-10-CM

## 2011-02-24 DIAGNOSIS — D510 Vitamin B12 deficiency anemia due to intrinsic factor deficiency: Secondary | ICD-10-CM

## 2011-03-01 ENCOUNTER — Telehealth: Payer: Self-pay | Admitting: *Deleted

## 2011-03-01 NOTE — Telephone Encounter (Signed)
Have no record of lab results for 02/03/11

## 2011-03-01 NOTE — Telephone Encounter (Signed)
Caller is scheduled to have home health visit w/patient next week for pre-certification visit & administer next B12; would like to know if you received labwork from 11.08.12 [facility], B12], states that she did not receive orders back and that they may not be necessarily needed--does she continue w/B12 injections.? If so, request new written orders for 12.12.12 visit for next [2] mths & send for signature.

## 2011-03-03 NOTE — Telephone Encounter (Signed)
Caller informed & will fax results to office.

## 2011-03-08 ENCOUNTER — Encounter: Payer: Self-pay | Admitting: Internal Medicine

## 2011-03-08 ENCOUNTER — Telehealth: Payer: Self-pay | Admitting: Internal Medicine

## 2011-03-08 NOTE — Telephone Encounter (Signed)
The home health nurse Tresa Endo) called and stated the labs from [facility] are showing high B-12 and blood levels.  She is hoping to clarify if she needs to continue with B-12 injections or to stop them.  Please advise.  (I dont see the most recent labs from [facility] in the chart and the nurse did not give the specific levels)   Thanks!

## 2011-03-08 NOTE — Telephone Encounter (Signed)
Cannot resolve without the lab values

## 2011-03-24 ENCOUNTER — Telehealth: Payer: Self-pay | Admitting: *Deleted

## 2011-03-24 NOTE — Telephone Encounter (Signed)
We received lab results stating that pts B12 was still in the low range at 302. Dr Debby Bud documented on paper lab that pt was to continue B12. Lab had already scanned these labs in and MD just wanted it to be noted that he had advised pt to continue B12

## 2011-05-09 ENCOUNTER — Telehealth: Payer: Self-pay | Admitting: Internal Medicine

## 2011-05-09 DIAGNOSIS — E538 Deficiency of other specified B group vitamins: Secondary | ICD-10-CM

## 2011-05-09 NOTE — Telephone Encounter (Signed)
Need to check B12 level

## 2011-05-09 NOTE — Telephone Encounter (Signed)
[facility] called and stated the pt stated she does not feel like she is getting any benefit from the monthly b-12 injections.  They are hoping to get advice on if Dr.Norins wants her b-12 levels drawn, the med stopped completely, or to continue treatment?    [facility] @ [facility] - [phone]

## 2011-05-09 NOTE — Telephone Encounter (Signed)
Gave VO to [place] w/[facility]; will draw for B12 check during home visit on Thursday 02.14.13

## 2011-05-09 NOTE — Telephone Encounter (Signed)
Patient homebound-request nurse w/[facility] to draw blood for B12 check. LMOM w/Kelly at [facility] to clarify if she can take verbal or needs written order.

## 2011-05-23 ENCOUNTER — Encounter: Payer: Self-pay | Admitting: Internal Medicine

## 2011-06-07 ENCOUNTER — Telehealth: Payer: Self-pay

## 2011-06-07 NOTE — Telephone Encounter (Signed)
D/c B 12 injections

## 2011-06-07 NOTE — Telephone Encounter (Signed)
Renee Donovan with [facility] called triage [facility] stating that she has current orders for administer b12 injections to patient. Patient last b12 level was 1206 (normal rage 211-911), she would like to know if MD is ok with continued b12 injections. Please advise thanks

## 2011-06-08 NOTE — Telephone Encounter (Signed)
Returned call to phone number provided//told wrong number.  Spoke with Tresa Endo and notified per MD

## 2011-06-21 ENCOUNTER — Telehealth: Payer: Self-pay

## 2011-06-21 NOTE — Telephone Encounter (Signed)
Copy of singed HIPAA document placed on MD's desk for review.

## 2011-06-21 NOTE — Telephone Encounter (Signed)
Called - left msg on answering machine. Patient not seen since July '12 - will need follow-up appt.

## 2011-06-21 NOTE — Telephone Encounter (Signed)
Son not on HIPPA release, sorry

## 2011-06-21 NOTE — Telephone Encounter (Signed)
Son not listed on HIPPA release. Sorry

## 2011-06-21 NOTE — Telephone Encounter (Signed)
Pt's son is requesting a return call from MD to discuss pt current state of health.

## 2011-06-21 NOTE — Telephone Encounter (Signed)
Pt called requesting a call back form MD to discuss pt's current states of health.

## 2011-07-05 DIAGNOSIS — D510 Vitamin B12 deficiency anemia due to intrinsic factor deficiency: Secondary | ICD-10-CM

## 2011-07-05 DIAGNOSIS — I10 Essential (primary) hypertension: Secondary | ICD-10-CM

## 2011-07-05 DIAGNOSIS — Z853 Personal history of malignant neoplasm of breast: Secondary | ICD-10-CM

## 2011-07-05 DIAGNOSIS — Z5181 Encounter for therapeutic drug level monitoring: Secondary | ICD-10-CM

## 2011-07-05 DIAGNOSIS — J438 Other emphysema: Secondary | ICD-10-CM

## 2011-07-07 ENCOUNTER — Telehealth: Payer: Self-pay | Admitting: Critical Care Medicine

## 2011-07-07 MED ORDER — ALBUTEROL SULFATE HFA 108 (90 BASE) MCG/ACT IN AERS: INHALATION_SPRAY | RESPIRATORY_TRACT | Status: DC

## 2011-07-07 NOTE — Telephone Encounter (Signed)
Called and spoke with pt and she stated that she is going to make appt with [facility] asap but could not make it today.  She requested a refill of the ventolin since she is completely out.  Refill of the ventolin has been sent to her pharmacy and pt is aware.

## 2011-07-21 ENCOUNTER — Encounter: Payer: Self-pay | Admitting: Internal Medicine

## 2011-07-21 ENCOUNTER — Telehealth: Payer: Self-pay | Admitting: Critical Care Medicine

## 2011-07-21 ENCOUNTER — Ambulatory Visit (INDEPENDENT_AMBULATORY_CARE_PROVIDER_SITE_OTHER): Payer: Medicare Other | Admitting: Internal Medicine

## 2011-07-21 ENCOUNTER — Other Ambulatory Visit (INDEPENDENT_AMBULATORY_CARE_PROVIDER_SITE_OTHER): Payer: Medicare Other

## 2011-07-21 VITALS — BP 164/88 | HR 59 | Temp 98.1°F | Resp 18 | Wt 188.0 lb

## 2011-07-21 DIAGNOSIS — E785 Hyperlipidemia, unspecified: Secondary | ICD-10-CM

## 2011-07-21 DIAGNOSIS — F1010 Alcohol abuse, uncomplicated: Secondary | ICD-10-CM

## 2011-07-21 DIAGNOSIS — E039 Hypothyroidism, unspecified: Secondary | ICD-10-CM

## 2011-07-21 DIAGNOSIS — F419 Anxiety disorder, unspecified: Secondary | ICD-10-CM

## 2011-07-21 DIAGNOSIS — F411 Generalized anxiety disorder: Secondary | ICD-10-CM

## 2011-07-21 DIAGNOSIS — N3281 Overactive bladder: Secondary | ICD-10-CM

## 2011-07-21 DIAGNOSIS — E538 Deficiency of other specified B group vitamins: Secondary | ICD-10-CM

## 2011-07-21 DIAGNOSIS — I10 Essential (primary) hypertension: Secondary | ICD-10-CM

## 2011-07-21 DIAGNOSIS — J438 Other emphysema: Secondary | ICD-10-CM

## 2011-07-21 DIAGNOSIS — N318 Other neuromuscular dysfunction of bladder: Secondary | ICD-10-CM

## 2011-07-21 LAB — COMPREHENSIVE METABOLIC PANEL
Albumin: 3.7 g/dL (ref 3.5–5.2)
BUN: 12 mg/dL (ref 6–23)
CO2: 27 mEq/L (ref 19–32)
GFR: 51.43 mL/min — ABNORMAL LOW (ref >60.00)
Glucose, Bld: 99 mg/dL (ref 70–99)
Sodium: 140 mEq/L (ref 135–145)
Total Bilirubin: 1.1 mg/dL (ref 0.3–1.2)
Total Protein: 7.1 g/dL (ref 6.0–8.3)

## 2011-07-21 LAB — CBC WITH DIFFERENTIAL/PLATELET
Basophils Absolute: 0 10*3/uL (ref 0.0–0.1)
Hemoglobin: 16.6 g/dL — ABNORMAL HIGH (ref 12.0–15.0)
Lymphocytes Relative: 15.5 % (ref 12.0–46.0)
Monocytes Relative: 6 % (ref 3.0–12.0)
Platelets: 262 10*3/uL (ref 150.0–400.0)
RDW: 14.3 % (ref 11.5–14.6)

## 2011-07-21 LAB — LIPID PANEL
Cholesterol: 152 mg/dL (ref 0–200)
HDL: 46.4 mg/dL (ref >39.00)
Triglycerides: 116 mg/dL (ref 0.0–149.0)

## 2011-07-21 LAB — HEPATIC FUNCTION PANEL
AST: 25 U/L (ref 0–37)
Alkaline Phosphatase: 83 U/L (ref 39–117)
Total Bilirubin: 1.1 mg/dL (ref 0.3–1.2)

## 2011-07-21 LAB — VITAMIN B12: Vitamin B-12: 445 pg/mL (ref 211–911)

## 2011-07-21 MED ORDER — SERTRALINE HCL 50 MG PO TABS: 50.0000 mg | ORAL_TABLET | Freq: Every day | ORAL | Status: DC

## 2011-07-21 NOTE — Progress Notes (Signed)
Subjective:    Patient ID: Renee Donovan, female    DOB: 12/14/1930, 78 y.o.   MRN: [id number]  HPI Patient has not been seen for many months. In the interval she had received home PT. She reports that she is using a walker all the time in large measure due to fear of falling. She last fell in March '13 - stumbles, not LOC. She denies leg weakness.   GU - she has urgency, frequency and urge incontinence. No dysuria. No flank pain or fevers.   Anxiety - she has been agraphobic and has been home bound.   Pulm - she has DOE. She has not been using her inhalational medications.  Alcohol consumption 20-24 oz wine per day: 5-6 oz alcohol.  Past Medical History  Diagnosis Date  . Breast cancer 1991    s/p R lumpectomy and radiation   . Hyperlipidemia   . Hypertension   . DDD (degenerative disc disease)   . DJD (degenerative joint disease)     ankles  . Cluster headache     remote  . COPD (chronic obstructive pulmonary disease)     Golds Stage II feV1 63% 11/2009   Past Surgical History  Procedure Date  . Breast lumpectomy 1991    Right breast  . Nose surgery 1993  . Chin lift [id number]  . Toe surgery 1965    5th toe- right foot   Family History  Problem Relation Age of Onset  . Coronary artery disease Father   . Heart attack Father   . Diabetes Father   . Breast cancer Mother    History   Social History  . Marital Status: Divorced    Spouse Name: N/A    Number of Children: 4  . Years of Education: N/A   Occupational History  . retired     Art therapist, Audiological scientist estate   Social History Main Topics  . Smoking status: Current Everyday Smoker -- 0.5 packs/day for 60 years    Types: Cigarettes  . Smokeless tobacco: Never Used  . Alcohol Use: Not on file     4 glasses of wine daily  . Drug Use: Not on file  . Sexually Active: Not on file   Other Topics Concern  . Not on file   Social History Narrative   HSG, Secretarial college x 1 yearMarried 1956- 3  years, divorced 48- 26 years- divorced1 son- 47, 3 adopted daughter 70, 26, 61: 2 grandchildrenWork- IT consultant, property Insurance account manager, real estate, retired$$- tightLives alone- house owned/paid for. Son lives nearbyLiving will- wants to full codeCurrent smoker 1 ppd x 60 years4 glasses of wine daily.    Current Outpatient Prescriptions on File Prior to Visit  Medication Sig Dispense Refill  . albuterol (PROVENTIL) (2.5 MG/3ML) 0.083% nebulizer solution Take 2.5 mg by nebulization every 6 (six) hours as needed for wheezing.  75 mL  6  . Cholecalciferol (VITAMIN D) 400 UNITS capsule Take 400 Units by mouth daily.        . cloNIDine (CATAPRES) 0.1 MG tablet Take 1 tablet (0.1 mg total) by mouth 2 (two) times daily.  60 tablet  3  . ibuprofen (ADVIL,MOTRIN) 200 MG tablet Take 200 mg by mouth every 6 (six) hours as needed.        Marland Kitchen levothyroxine (SYNTHROID, LEVOTHROID) 50 MCG tablet Take 50 mcg by mouth daily.        Marland Kitchen lovastatin (MEVACOR) 40 MG tablet Take 40 mg by mouth daily.        Marland Kitchen  methocarbamol (ROBAXIN) 750 MG tablet Take 750 mg by mouth as needed.        Marland Kitchen albuterol (VENTOLIN HFA) 108 (90 BASE) MCG/ACT inhaler 1-2 puffs every  4-6 hours as needed  18 g  1  . arformoterol (BROVANA) 15 MCG/2ML NEBU Take 2 mLs (15 mcg total) by nebulization daily. When current supply is gone, stop brovana  120 mL    . budesonide (PULMICORT) 0.25 MG/2ML nebulizer solution Take 2 mLs (0.25 mg total) by nebulization daily.  60 mL    . cyanocobalamin (,VITAMIN B-12,) 1000 MCG/ML injection Inject 1 mL (1,000 mcg total) into the skin every 30 (thirty) days.  10 mL  1  . diphenhydrAMINE (BENADRYL) 25 MG tablet Take 50 mg by mouth at bedtime.        Marland Kitchen lisinopril-hydrochlorothiazide (PRINZIDE,ZESTORETIC) 20-12.5 MG per tablet Take 1 tablet by mouth daily.        . Multiple Vitamin (MULTIVITAMIN) capsule Take 1 capsule by mouth daily.        . SYRINGE-NEEDLE, DISP, 3 ML (B-D INTEGRA SYRINGE) 25G X 5/8" 3 ML MISC Use  with b-12 injections  50 each  0  . verapamil (CALAN-SR) 240 MG CR tablet Take 1 tablet (240 mg total) by mouth 2 (two) times daily.  60 tablet  3  . DISCONTD: budesonide (PULMICORT) 0.25 MG/2ML nebulizer solution Take 2 mLs (0.25 mg total) by nebulization daily.  60 mL  12      Review of Systems System review is negative for any constitutional, cardiac, pulmonary, GI or neuro symptoms or complaints other than as described in the HPI.     Objective:   Physical Exam Filed Vitals:   07/21/11 1041  BP: 164/88  Pulse: 59  Temp: 98.1 F (36.7 C)  Resp: 18   Wt Readings from Last 3 Encounters:  07/21/11 188 lb (85.276 kg)  12/07/10 180 lb 3.2 oz (81.738 kg)  10/06/10 183 lb 6.4 oz (83.19 kg)   Gen'l- elderly white woman looking younger than her stated age, in no distress HEENT - C&S clear, PERRLA Neck -supple Cor- 2+ radial, RRR Pulm - on oxygen. Decreased breath sounds but no increased WOB, no wheezing Abd- soft Neuro - A&O x 3, Cognitively intact, CN II-XII normal, Gait - unsteady       Assessment & Plan:

## 2011-07-21 NOTE — Patient Instructions (Signed)
Over active bladder - with incontinence. This is likely to be the problem. Plan - will try myrbetrig once a day. If this works we can prescribe this product or if too expensive we can use another product.  Lungs - you have emphysema - you should stop smoking. Per Dr. Lynelle Doctor September note you should be using the symbicort twice a day and he wanted you to use the albuterol twice a day. You need to make an appointment to see him.  Thyroid - will check lab today.  B12 deficiency - will check lab today.  Anxiety - try taking zoloft (sertraline) 50 mg once a day.  Alcohol  - recommend limiting yourself to 2-3 glasses of wine per day.  You will get a letter with all the results.

## 2011-07-21 NOTE — Telephone Encounter (Signed)
No samples available at this time of any rescue inhaler  Offered to send in rx for this med, but pt declined, stating already has an inhaler

## 2011-07-24 ENCOUNTER — Encounter: Payer: Self-pay | Admitting: Internal Medicine

## 2011-07-24 DIAGNOSIS — N3281 Overactive bladder: Secondary | ICD-10-CM | POA: Insufficient documentation

## 2011-07-24 DIAGNOSIS — F1010 Alcohol abuse, uncomplicated: Secondary | ICD-10-CM | POA: Insufficient documentation

## 2011-07-24 DIAGNOSIS — F4323 Adjustment disorder with mixed anxiety and depressed mood: Secondary | ICD-10-CM | POA: Insufficient documentation

## 2011-07-24 NOTE — Assessment & Plan Note (Signed)
Discussed with her the deleterious effects of too much alcohol and recommended moderation - 2-3 oz per day.

## 2011-07-24 NOTE — Assessment & Plan Note (Signed)
Advised her of her advanced state of disease (Gold Stage IV).   Plan  Continue present regimen  See Dr. Delford Field as instructed  Adamantly encouraged to stop smoking.

## 2011-07-24 NOTE — Assessment & Plan Note (Signed)
TSH is mildly elevated - present dose of levothyroxine is 50 mcg.  Plan - increase levothyroxine to 75 mcg at next refill.

## 2011-07-24 NOTE — Assessment & Plan Note (Signed)
B12 445 today.

## 2011-07-24 NOTE — Assessment & Plan Note (Signed)
Patient with symptoms that are suggestive of OAB  Plan - trial of Myrbetrig, a new product for OAB.   If this helps will provide Rx or a more cost effective product for the same purpose.

## 2011-07-24 NOTE — Assessment & Plan Note (Signed)
Patient c/o anxiety and that she is afraid of going out of her home, especially concerned about falls.  Plan  Sertraline 50 mg daily

## 2011-07-24 NOTE — Assessment & Plan Note (Signed)
BP Readings from Last 3 Encounters:  07/21/11 164/88  12/07/10 144/80  10/21/10 120/64   Poor control today but previously doing well.  Plan - no change in medication at this time.

## 2011-07-25 ENCOUNTER — Telehealth: Payer: Self-pay

## 2011-07-25 NOTE — Telephone Encounter (Signed)
Ok for Myrbewtrig 25 mg, 1 po qd, #30, refill 11

## 2011-07-25 NOTE — Telephone Encounter (Signed)
Pt called stating samples of Myrbetrig 25 mg qd samples helped with overactive bladder sxs. Pt is requesting Rx for same to pharmacy, okay to order?

## 2011-07-27 MED ORDER — MIRABEGRON ER 25 MG PO TB24: 1.0000 | ORAL_TABLET | Freq: Every day | ORAL | Status: DC

## 2012-02-02 ENCOUNTER — Telehealth: Payer: Self-pay | Admitting: Critical Care Medicine

## 2012-02-02 NOTE — Telephone Encounter (Signed)
I spoke with the pt and advised we have to see her once a year in order to send in refills for medication. I advised the pt we can schedule an appt now and then we can send in rx for medication to last until appt. Pt states she will have to check with her caregiver and call back to set appt. Will await callback.Carron Curie, CMA

## 2012-02-06 NOTE — Telephone Encounter (Signed)
Pt has been scheduled for Wed., 02/08/12 @ 11:45 with PW and states she is okay with her prescriptions until seen here in the office.

## 2012-02-08 ENCOUNTER — Ambulatory Visit (INDEPENDENT_AMBULATORY_CARE_PROVIDER_SITE_OTHER): Payer: Medicare Other | Admitting: Critical Care Medicine

## 2012-02-08 ENCOUNTER — Encounter: Payer: Self-pay | Admitting: Critical Care Medicine

## 2012-02-08 VITALS — BP 122/68 | HR 73 | Temp 97.9°F | Ht 66.0 in

## 2012-02-08 DIAGNOSIS — J439 Emphysema, unspecified: Secondary | ICD-10-CM

## 2012-02-08 DIAGNOSIS — J438 Other emphysema: Secondary | ICD-10-CM

## 2012-02-08 MED ORDER — FLUTICASONE FUROATE-VILANTEROL 100-25 MCG/INH IN AEPB: 1.0000 | INHALATION_SPRAY | Freq: Every day | RESPIRATORY_TRACT | Status: DC

## 2012-02-08 MED ORDER — ARFORMOTEROL TARTRATE 15 MCG/2ML IN NEBU: INHALATION_SOLUTION | RESPIRATORY_TRACT | Status: DC

## 2012-02-08 MED ORDER — ALBUTEROL SULFATE (2.5 MG/3ML) 0.083% IN NEBU: 2.5000 mg | INHALATION_SOLUTION | Freq: Four times a day (QID) | RESPIRATORY_TRACT | Status: DC | PRN

## 2012-02-08 MED ORDER — BUDESONIDE 0.25 MG/2ML IN SUSP: RESPIRATORY_TRACT | Status: DC

## 2012-02-08 NOTE — Patient Instructions (Addendum)
Trial Breo one puff daily, use samples and free one month refill Stop Brovana and Budesonide in nebulizer You can continue albuterol inhaler or nebulizer as needed Return 2 months Call with report on how your are doing on [facility]

## 2012-02-08 NOTE — Assessment & Plan Note (Signed)
Gold stage D. COPD with ongoing tobacco use Plan Trial Breo one puff daily, use samples and free one month refill Stop Brovana and Budesonide in nebulizer You can continue albuterol inhaler or nebulizer as needed Return 2 months

## 2012-02-08 NOTE — Progress Notes (Signed)
Subjective:    Patient ID: Renee Donovan, female    DOB: [birth year], 78 y.o.   MRN: [id number]  HPI  This is a 78 y.o.  WF with Golds D  COPD  02/08/2012 Not using neb meds much.  Pt not coughing. Pt remains severely dyspneic. Ok if seated. Worse going to kitchen from bedroom.    Past Medical History  Diagnosis Date  . Breast cancer 1991    s/p R lumpectomy and radiation   . Hyperlipidemia   . Hypertension   . DDD (degenerative disc disease)   . DJD (degenerative joint disease)     ankles  . Cluster headache     remote  . COPD (chronic obstructive pulmonary disease)     Golds Stage II feV1 63% 11/2009     Family History  Problem Relation Age of Onset  . Coronary artery disease Father   . Heart attack Father   . Diabetes Father   . Breast cancer Mother      History   Social History  . Marital Status: Divorced    Spouse Name: N/A    Number of Children: 4  . Years of Education: 12   Occupational History  . retired     Art therapist, Audiological scientist estate   Social History Main Topics  . Smoking status: Current Every Day Smoker -- 0.5 packs/day for 60 years    Types: Cigarettes  . Smokeless tobacco: Never Used     Comment: e cigarrette///02/08/12  . Alcohol Use: Not on file     Comment: 4 glasses of wine daily  . Drug Use: Not on file  . Sexually Active: Not on file   Other Topics Concern  . 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 Married 1956- 3 years, divorced 32- 26 years- divorced1 son- 53, 3 adopted daughter 77, 63, 43: 2 grandchildren. Work- IT consultant, Therapist, sports, Research officer, political party, retired. $$- tight. Lives alone- house owned/paid for. Son lives [place]-Living will- wants to full code. Did discuss probabilities of success and chance of permanent mechanical ventilation. Will discuss further at next visit. (April '13).  Current smoker 1 ppd x 60 years. 4 glasses of wine daily.     No Known Allergies     Outpatient Prescriptions Prior to Visit  Medication Sig Dispense Refill  . albuterol (VENTOLIN HFA) 108 (90 BASE) MCG/ACT inhaler 1-2 puffs every  4-6 hours as needed  18 g  1  . cloNIDine (CATAPRES) 0.1 MG tablet Take 1 tablet (0.1 mg total) by mouth 2 (two) times daily.  60 tablet  3  . diphenhydrAMINE (BENADRYL) 25 MG tablet Take 50 mg by mouth 3 times/day as needed-between meals & bedtime.       Marland Kitchen ibuprofen (ADVIL,MOTRIN) 200 MG tablet Take 200 mg by mouth every 6 (six) hours as needed.        . sertraline (ZOLOFT) 50 MG tablet Take 1 tablet (50 mg total) by mouth daily.  30 tablet  3  . verapamil (CALAN-SR) 240 MG CR tablet Take 1 tablet (240 mg total) by mouth 2 (two) times daily.  60 tablet  3  . Cholecalciferol (VITAMIN D) 400 UNITS capsule Take 400 Units by mouth daily.        Marland Kitchen levothyroxine (SYNTHROID, LEVOTHROID) 50 MCG tablet Take 50 mcg by mouth daily.        Marland Kitchen lovastatin (MEVACOR) 40 MG tablet Take 40 mg by mouth daily.        Marland Kitchen  methocarbamol (ROBAXIN) 750 MG tablet Take 750 mg by mouth as needed.        . Mirabegron ER 25 MG TB24 Take 1 tablet (25 mg total) by mouth daily.  30 tablet  11  . Multiple Vitamin (MULTIVITAMIN) capsule Take 1 capsule by mouth daily.        Marland Kitchen albuterol (PROVENTIL) (2.5 MG/3ML) 0.083% nebulizer solution Take 2.5 mg by nebulization every 6 (six) hours as needed for wheezing.  75 mL  6  . arformoterol (BROVANA) 15 MCG/2ML NEBU Take 2 mLs (15 mcg total) by nebulization daily. When current supply is gone, stop brovana  120 mL    . budesonide (PULMICORT) 0.25 MG/2ML nebulizer solution Take 2 mLs (0.25 mg total) by nebulization daily.  60 mL    . cyanocobalamin (,VITAMIN B-12,) 1000 MCG/ML injection Inject 1 mL (1,000 mcg total) into the skin every 30 (thirty) days.  10 mL  1  . lisinopril-hydrochlorothiazide (PRINZIDE,ZESTORETIC) 20-12.5 MG per tablet Take 1 tablet by mouth daily.        . SYRINGE-NEEDLE, DISP, 3 ML (B-D INTEGRA SYRINGE) 25G X 5/8" 3 ML  MISC Use with b-12 injections  50 each  0      Review of Systems  Constitutional:   No  weight loss, night sweats,  Fevers, chills,+  Fatigue,+  lassitude. HEENT:   No headaches,  Difficulty swallowing,  Tooth/dental problems,  Sore throat,                No sneezing, itching, ear ache, nasal congestion, post nasal drip,   CV:  No chest pain,  Orthopnea, PND, swelling in lower extremities, anasarca, dizziness, palpitations  GI  + heartburn,+  indigestion, abdominal pain, nausea, vomiting, diarrhea, change in bowel habits, loss of appetite  Resp: Notes  shortness of breath with exertion ,  Not  at rest.  No excess mucus, no productive cough,  No non-productive cough,  No coughing up of blood.  No change in color of mucus.  No wheezing.  No chest wall deformity  Skin: no rash or lesions.  GU: no dysuria, change in color of urine, no urgency or frequency.  No flank pain.  MS:  No joint pain or swelling.  No decreased range of motion.  No back pain.  Psych:  No change in mood or affect. No depression or anxiety.  No memory loss.     Objective:   Physical Exam  Filed Vitals:   02/08/12 1157  BP: 122/68  Pulse: 73  Temp: 97.9 F (36.6 C)  TempSrc: Oral  Height: 5\' 6"  (1.676 m)  SpO2: 95%    Gen: Pleasant, well-nourished, in no distress,  normal affect  ENT: No lesions,  mouth clear,  oropharynx clear, no postnasal drip  Neck: No JVD, no TMG, no carotid bruits  Lungs: No use of accessory muscles, no dullness to percussion, distant BS  Cardiovascular: RRR, heart sounds normal, no murmur or gallops, no peripheral edema  Abdomen: soft and NT, no HSM,  BS normal  Musculoskeletal: No deformities, no cyanosis or clubbing  Neuro: alert, non focal  Skin: Warm, no lesions or rashes        Assessment & Plan:   EMPHYSEMA gold D  COPD Gold stage D. COPD with ongoing tobacco use Plan Trial Breo one puff daily, use samples and free one month refill Stop Brovana and  Budesonide in nebulizer You can continue albuterol inhaler or nebulizer as needed Return 2 months  Updated Medication List Outpatient Encounter Prescriptions as of 02/08/2012  Medication Sig Dispense Refill  . albuterol (VENTOLIN HFA) 108 (90 BASE) MCG/ACT inhaler 1-2 puffs every  4-6 hours as needed  18 g  1  . cloNIDine (CATAPRES) 0.1 MG tablet Take 1 tablet (0.1 mg total) by mouth 2 (two) times daily.  60 tablet  3  . diphenhydrAMINE (BENADRYL) 25 MG tablet Take 50 mg by mouth 3 times/day as needed-between meals & bedtime.       Marland Kitchen ibuprofen (ADVIL,MOTRIN) 200 MG tablet Take 200 mg by mouth every 6 (six) hours as needed.        . sertraline (ZOLOFT) 50 MG tablet Take 1 tablet (50 mg total) by mouth daily.  30 tablet  3  . verapamil (CALAN-SR) 240 MG CR tablet Take 1 tablet (240 mg total) by mouth 2 (two) times daily.  60 tablet  3  . albuterol (PROVENTIL) (2.5 MG/3ML) 0.083% nebulizer solution Take 3 mLs (2.5 mg total) by nebulization every 6 (six) hours as needed for wheezing.  75 mL  6  . arformoterol (BROVANA) 15 MCG/2ML NEBU HOLD  120 mL    . budesonide (PULMICORT) 0.25 MG/2ML nebulizer solution HOLD  60 mL    . Cholecalciferol (VITAMIN D) 400 UNITS capsule Take 400 Units by mouth daily.        . Fluticasone Furoate-Vilanterol (BREO ELLIPTA) 100-25 MCG/INH AEPB Inhale 1 puff into the lungs daily.  30 each  1  . levothyroxine (SYNTHROID, LEVOTHROID) 50 MCG tablet Take 50 mcg by mouth daily.        Marland Kitchen lovastatin (MEVACOR) 40 MG tablet Take 40 mg by mouth daily.        . methocarbamol (ROBAXIN) 750 MG tablet Take 750 mg by mouth as needed.        . Mirabegron ER 25 MG TB24 Take 1 tablet (25 mg total) by mouth daily.  30 tablet  11  . Multiple Vitamin (MULTIVITAMIN) capsule Take 1 capsule by mouth daily.        . [DISCONTINUED] albuterol (PROVENTIL) (2.5 MG/3ML) 0.083% nebulizer solution Take 2.5 mg by nebulization every 6 (six) hours as needed for wheezing.  75 mL  6  . [DISCONTINUED]  albuterol (PROVENTIL) (2.5 MG/3ML) 0.083% nebulizer solution Take 3 mLs (2.5 mg total) by nebulization every 6 (six) hours as needed for wheezing.  75 mL  6  . [DISCONTINUED] arformoterol (BROVANA) 15 MCG/2ML NEBU Take 2 mLs (15 mcg total) by nebulization daily. When current supply is gone, stop brovana  120 mL    . [DISCONTINUED] budesonide (PULMICORT) 0.25 MG/2ML nebulizer solution Take 2 mLs (0.25 mg total) by nebulization daily.  60 mL    . [DISCONTINUED] cyanocobalamin (,VITAMIN B-12,) 1000 MCG/ML injection Inject 1 mL (1,000 mcg total) into the skin every 30 (thirty) days.  10 mL  1  . [DISCONTINUED] Fluticasone Furoate-Vilanterol (BREO ELLIPTA) 100-25 MCG/INH AEPB Inhale 1 puff into the lungs daily.  30 each  1  . [DISCONTINUED] lisinopril-hydrochlorothiazide (PRINZIDE,ZESTORETIC) 20-12.5 MG per tablet Take 1 tablet by mouth daily.        . [DISCONTINUED] SYRINGE-NEEDLE, DISP, 3 ML (B-D INTEGRA SYRINGE) 25G X 5/8" 3 ML MISC Use with b-12 injections  50 each  0

## 2012-03-02 ENCOUNTER — Telehealth: Payer: Self-pay | Admitting: Critical Care Medicine

## 2012-03-02 MED ORDER — FLUTICASONE FUROATE-VILANTEROL 100-25 MCG/INH IN AEPB: 1.0000 | INHALATION_SPRAY | Freq: Every day | RESPIRATORY_TRACT | Status: DC

## 2012-03-02 NOTE — Telephone Encounter (Signed)
Spoke with patient, patient states that with the trial of Breo she feels this has worked better than any other med and is requesting a rx for this. Dr. Delford Field please advise if this is ok to send in.  Thank You [name]

## 2012-03-02 NOTE — Telephone Encounter (Signed)
Spoke with patient, she is aware Rx will be sent into  [facility].  Nothing further needed at this time.

## 2012-03-02 NOTE — Telephone Encounter (Signed)
Yes , pls send Rx for the pt. If her insurance will not cover, get her a free refill coupon and Rx and send to her pharmacy or give her more samples

## 2012-03-08 ENCOUNTER — Telehealth: Payer: Self-pay | Admitting: Critical Care Medicine

## 2012-03-08 NOTE — Telephone Encounter (Signed)
Called and spoke to pt on 03/08/12 to make next ov per recall.  She stated she is in "bad shape" and does not know when she can come in.  I advised we could set her up an appt and if she wanted to reschedule she could, but she stated she did not want to make the appt right now and would call us back.  Leanora Ivanoff

## 2012-03-09 ENCOUNTER — Telehealth: Payer: Self-pay | Admitting: *Deleted

## 2012-03-09 MED ORDER — FLUTICASONE FUROATE-VILANTEROL 100-25 MCG/INH IN AEPB: 1.0000 | INHALATION_SPRAY | Freq: Every day | RESPIRATORY_TRACT | Status: DC

## 2012-03-09 NOTE — Telephone Encounter (Signed)
Received faxed Prior Auth for Breo from "third party reject [facility].  Called [phone].  They will fax form to triage -- awaiting fax.  Called, spoke with pt.  States she picked up the free 30 day rx approx 2 days ago from [place].  She is aware Virgel Bouquet will need a PA to see if her insurance will pay for this.  Pt aware we have requested this fax and will call her once this has been taken care of.  She verbalized understanding.  Pt is also aware I have placed 1 sample of breo at the front office at [facility] for pick up.  She verbalized understanding and states she will have to send her caregiver to pick this up as she cannot come right now.  This will be at least next week before her caregiver can come.  Advised this was ok and to call back if anything is needed in the meantime.  She verbalized understanding.

## 2012-03-14 NOTE — Telephone Encounter (Signed)
I have not seen this come across yet.  Called [facility] at [phone], spoke with Khang.  He will refax PA to triage.

## 2012-03-14 NOTE — Telephone Encounter (Signed)
Received PA.  This has been filled out and placed in PW's folder for signature.

## 2012-03-15 NOTE — Telephone Encounter (Signed)
Breo PA reviewed and signed by PW.  Faxed to [phone].  Will hold to f/u on.

## 2012-03-16 NOTE — Telephone Encounter (Signed)
Received APPROVAL letter for Renee Donovan approved through 03/27/2013 under Medicare Part D benefit.  Pt and [name] aware.  Approval letter given to front staff to scan into chart.

## 2012-03-17 ENCOUNTER — Encounter (HOSPITAL_COMMUNITY): Payer: Self-pay | Admitting: *Deleted

## 2012-03-17 ENCOUNTER — Emergency Department (HOSPITAL_COMMUNITY): Payer: Medicare Other

## 2012-03-17 ENCOUNTER — Inpatient Hospital Stay (HOSPITAL_COMMUNITY)
Admission: EM | Admit: 2012-03-17 | Discharge: 2012-03-27 | DRG: 192 | Disposition: A | Discharge status: Home Health | Payer: Medicare Other | Attending: Internal Medicine | Admitting: Internal Medicine

## 2012-03-17 DIAGNOSIS — E039 Hypothyroidism, unspecified: Secondary | ICD-10-CM

## 2012-03-17 DIAGNOSIS — E876 Hypokalemia: Secondary | ICD-10-CM

## 2012-03-17 DIAGNOSIS — Z853 Personal history of malignant neoplasm of breast: Secondary | ICD-10-CM

## 2012-03-17 DIAGNOSIS — Z79899 Other long term (current) drug therapy: Secondary | ICD-10-CM

## 2012-03-17 DIAGNOSIS — R197 Diarrhea, unspecified: Secondary | ICD-10-CM

## 2012-03-17 DIAGNOSIS — R7309 Other abnormal glucose: Secondary | ICD-10-CM | POA: Diagnosis not present

## 2012-03-17 DIAGNOSIS — J441 Chronic obstructive pulmonary disease with (acute) exacerbation: Secondary | ICD-10-CM

## 2012-03-17 DIAGNOSIS — F419 Anxiety disorder, unspecified: Secondary | ICD-10-CM

## 2012-03-17 DIAGNOSIS — R0902 Hypoxemia: Secondary | ICD-10-CM

## 2012-03-17 DIAGNOSIS — I10 Essential (primary) hypertension: Secondary | ICD-10-CM

## 2012-03-17 DIAGNOSIS — T380X5A Adverse effect of glucocorticoids and synthetic analogues, initial encounter: Secondary | ICD-10-CM | POA: Diagnosis not present

## 2012-03-17 DIAGNOSIS — F1010 Alcohol abuse, uncomplicated: Secondary | ICD-10-CM

## 2012-03-17 DIAGNOSIS — J438 Other emphysema: Secondary | ICD-10-CM

## 2012-03-17 DIAGNOSIS — F17200 Nicotine dependence, unspecified, uncomplicated: Secondary | ICD-10-CM

## 2012-03-17 DIAGNOSIS — E785 Hyperlipidemia, unspecified: Secondary | ICD-10-CM

## 2012-03-17 DIAGNOSIS — R0602 Shortness of breath: Secondary | ICD-10-CM

## 2012-03-17 LAB — POCT I-STAT TROPONIN I: Troponin i, poc: 0.01 ng/mL (ref 0.00–0.08)

## 2012-03-17 LAB — CBC
HCT: 50.2 % — ABNORMAL HIGH (ref 36.0–46.0)
Hemoglobin: 17.2 g/dL — ABNORMAL HIGH (ref 12.0–15.0)
RBC: 4.99 MIL/uL (ref 3.87–5.11)
WBC: 11.2 10*3/uL — ABNORMAL HIGH (ref 4.0–10.5)

## 2012-03-17 LAB — GLUCOSE, CAPILLARY

## 2012-03-17 LAB — BASIC METABOLIC PANEL
Chloride: 97 mEq/L (ref 96–112)
GFR calc Af Amer: 65 mL/min — ABNORMAL LOW (ref >90)
GFR calc non Af Amer: 56 mL/min — ABNORMAL LOW (ref >90)
Potassium: 3.6 mEq/L (ref 3.5–5.1)
Sodium: 140 mEq/L (ref 135–145)

## 2012-03-17 LAB — BLOOD GAS, ARTERIAL
Bicarbonate: 28.9 mEq/L — ABNORMAL HIGH (ref 20.0–24.0)
O2 Saturation: 90.8 %
Patient temperature: 98.6
pH, Arterial: 7.458 — ABNORMAL HIGH (ref 7.350–7.450)

## 2012-03-17 LAB — PRO B NATRIURETIC PEPTIDE: Pro B Natriuretic peptide (BNP): 1403 pg/mL — ABNORMAL HIGH (ref 0–450)

## 2012-03-17 MED ORDER — ENOXAPARIN SODIUM 40 MG/0.4ML ~~LOC~~ SOLN
40.0000 mg | SUBCUTANEOUS | Status: DC
  Administered 2012-03-17 – 2012-03-26 (×10): 40 mg via SUBCUTANEOUS
  Filled 2012-03-17 (×12): qty 0.4

## 2012-03-17 MED ORDER — INSULIN ASPART 100 UNIT/ML ~~LOC~~ SOLN
0.0000 [IU] | Freq: Every day | SUBCUTANEOUS | Status: DC
  Administered 2012-03-17 – 2012-03-18 (×2): 2 [IU] via SUBCUTANEOUS
  Administered 2012-03-21: 4 [IU] via SUBCUTANEOUS
  Administered 2012-03-22: 2 [IU] via SUBCUTANEOUS
  Administered 2012-03-24: 8 [IU] via SUBCUTANEOUS
  Administered 2012-03-25: 2 [IU] via SUBCUTANEOUS

## 2012-03-17 MED ORDER — ALPRAZOLAM 0.25 MG PO TABS
0.2500 mg | ORAL_TABLET | Freq: Every evening | ORAL | Status: DC | PRN
  Administered 2012-03-18: 0.25 mg via ORAL
  Filled 2012-03-17: qty 1

## 2012-03-17 MED ORDER — IPRATROPIUM BROMIDE 0.02 % IN SOLN
0.5000 mg | Freq: Four times a day (QID) | RESPIRATORY_TRACT | Status: DC
  Administered 2012-03-17 – 2012-03-27 (×38): 0.5 mg via RESPIRATORY_TRACT
  Filled 2012-03-17 (×39): qty 2.5

## 2012-03-17 MED ORDER — LEVOTHYROXINE SODIUM 50 MCG PO TABS
50.0000 ug | ORAL_TABLET | Freq: Every day | ORAL | Status: DC
  Administered 2012-03-18 – 2012-03-27 (×10): 50 ug via ORAL
  Filled 2012-03-17 (×12): qty 1

## 2012-03-17 MED ORDER — VITAMIN B-1 100 MG PO TABS
100.0000 mg | ORAL_TABLET | Freq: Every day | ORAL | Status: DC
  Administered 2012-03-17 – 2012-03-27 (×11): 100 mg via ORAL
  Filled 2012-03-17 (×11): qty 1

## 2012-03-17 MED ORDER — LEVOFLOXACIN IN D5W 500 MG/100ML IV SOLN
500.0000 mg | INTRAVENOUS | Status: DC
  Administered 2012-03-17: 500 mg via INTRAVENOUS
  Filled 2012-03-17 (×2): qty 100

## 2012-03-17 MED ORDER — HYDROCODONE-ACETAMINOPHEN 5-325 MG PO TABS
0.5000 | ORAL_TABLET | Freq: Four times a day (QID) | ORAL | Status: DC | PRN
  Administered 2012-03-18: 0.5 via ORAL
  Filled 2012-03-17: qty 1

## 2012-03-17 MED ORDER — THIAMINE HCL 100 MG/ML IJ SOLN
100.0000 mg | Freq: Every day | INTRAMUSCULAR | Status: DC
  Filled 2012-03-17 (×10): qty 1

## 2012-03-17 MED ORDER — SIMVASTATIN 5 MG PO TABS: 5.0000 mg | ORAL_TABLET | Freq: Every day | ORAL | Status: DC

## 2012-03-17 MED ORDER — SODIUM CHLORIDE 0.9 % IV SOLN
INTRAVENOUS | Status: DC
  Administered 2012-03-18: 20 mL/h via INTRAVENOUS
  Administered 2012-03-20: 10:00:00 via INTRAVENOUS
  Administered 2012-03-24: 250 mL via INTRAVENOUS

## 2012-03-17 MED ORDER — FOLIC ACID 1 MG PO TABS
1.0000 mg | ORAL_TABLET | Freq: Every day | ORAL | Status: DC
  Administered 2012-03-17 – 2012-03-27 (×11): 1 mg via ORAL
  Filled 2012-03-17 (×11): qty 1

## 2012-03-17 MED ORDER — MIRABEGRON ER 25 MG PO TB24: 25.0000 mg | ORAL_TABLET | Freq: Every day | ORAL | Status: DC

## 2012-03-17 MED ORDER — ACETAMINOPHEN 325 MG PO TABS: 650.0000 mg | ORAL_TABLET | Freq: Four times a day (QID) | ORAL | Status: DC | PRN

## 2012-03-17 MED ORDER — MORPHINE SULFATE 2 MG/ML IJ SOLN
1.0000 mg | Freq: Four times a day (QID) | INTRAMUSCULAR | Status: DC | PRN
  Administered 2012-03-17: 1 mg via INTRAVENOUS
  Filled 2012-03-17: qty 1

## 2012-03-17 MED ORDER — ACETAMINOPHEN 650 MG RE SUPP: 650.0000 mg | Freq: Four times a day (QID) | RECTAL | Status: DC | PRN

## 2012-03-17 MED ORDER — VERAPAMIL HCL ER 240 MG PO TBCR
240.0000 mg | EXTENDED_RELEASE_TABLET | Freq: Two times a day (BID) | ORAL | Status: DC
  Administered 2012-03-17 – 2012-03-23 (×12): 240 mg via ORAL
  Filled 2012-03-17 (×13): qty 1

## 2012-03-17 MED ORDER — ALBUTEROL SULFATE (5 MG/ML) 0.5% IN NEBU
2.5000 mg | INHALATION_SOLUTION | Freq: Four times a day (QID) | RESPIRATORY_TRACT | Status: DC
  Administered 2012-03-17 – 2012-03-27 (×38): 2.5 mg via RESPIRATORY_TRACT
  Filled 2012-03-17 (×37): qty 0.5

## 2012-03-17 MED ORDER — HYDROCHLOROTHIAZIDE 12.5 MG PO CAPS
12.5000 mg | ORAL_CAPSULE | Freq: Every day | ORAL | Status: DC
  Administered 2012-03-17 – 2012-03-27 (×11): 12.5 mg via ORAL
  Filled 2012-03-17 (×12): qty 1

## 2012-03-17 MED ORDER — NICOTINE 14 MG/24HR TD PT24
14.0000 mg | MEDICATED_PATCH | Freq: Every day | TRANSDERMAL | Status: DC
  Administered 2012-03-17 – 2012-03-27 (×12): 14 mg via TRANSDERMAL
  Filled 2012-03-17 (×12): qty 1

## 2012-03-17 MED ORDER — LORAZEPAM 2 MG/ML IJ SOLN: 1.0000 mg | Freq: Four times a day (QID) | INTRAMUSCULAR | Status: DC | PRN

## 2012-03-17 MED ORDER — VERAPAMIL HCL ER 240 MG PO TBCR: 240.0000 mg | EXTENDED_RELEASE_TABLET | Freq: Two times a day (BID) | ORAL | Status: DC

## 2012-03-17 MED ORDER — ADULT MULTIVITAMIN W/MINERALS CH
1.0000 | ORAL_TABLET | Freq: Every day | ORAL | Status: DC
  Administered 2012-03-17 – 2012-03-27 (×11): 1 via ORAL
  Filled 2012-03-17 (×11): qty 1

## 2012-03-17 MED ORDER — ONDANSETRON HCL 4 MG/2ML IJ SOLN: 4.0000 mg | Freq: Four times a day (QID) | INTRAMUSCULAR | Status: DC | PRN

## 2012-03-17 MED ORDER — METHYLPREDNISOLONE SODIUM SUCC 125 MG IJ SOLR
80.0000 mg | Freq: Three times a day (TID) | INTRAMUSCULAR | Status: DC
  Administered 2012-03-17: 80 mg via INTRAVENOUS
  Filled 2012-03-17 (×2): qty 1.28

## 2012-03-17 MED ORDER — ONDANSETRON HCL 4 MG PO TABS: 4.0000 mg | ORAL_TABLET | Freq: Four times a day (QID) | ORAL | Status: DC | PRN

## 2012-03-17 MED ORDER — METHOCARBAMOL 500 MG PO TABS
750.0000 mg | ORAL_TABLET | ORAL | Status: DC | PRN
  Administered 2012-03-17 – 2012-03-23 (×8): 750 mg via ORAL
  Filled 2012-03-17 (×2): qty 1
  Filled 2012-03-17 (×7): qty 2

## 2012-03-17 MED ORDER — INSULIN ASPART 100 UNIT/ML ~~LOC~~ SOLN
0.0000 [IU] | Freq: Three times a day (TID) | SUBCUTANEOUS | Status: DC
  Administered 2012-03-18 (×2): 3 [IU] via SUBCUTANEOUS
  Administered 2012-03-18 – 2012-03-19 (×3): 2 [IU] via SUBCUTANEOUS
  Administered 2012-03-19: 5 [IU] via SUBCUTANEOUS
  Administered 2012-03-20: 3 [IU] via SUBCUTANEOUS
  Administered 2012-03-20: 5 [IU] via SUBCUTANEOUS
  Administered 2012-03-21: 2 [IU] via SUBCUTANEOUS
  Administered 2012-03-21: 3 [IU] via SUBCUTANEOUS
  Administered 2012-03-21: 2 [IU] via SUBCUTANEOUS
  Administered 2012-03-22 (×2): 3 [IU] via SUBCUTANEOUS
  Administered 2012-03-22: 5 [IU] via SUBCUTANEOUS
  Administered 2012-03-23 – 2012-03-26 (×9): 3 [IU] via SUBCUTANEOUS
  Administered 2012-03-26: 8 [IU] via SUBCUTANEOUS
  Administered 2012-03-26: 3 [IU] via SUBCUTANEOUS
  Administered 2012-03-27: 2 [IU] via SUBCUTANEOUS
  Administered 2012-03-27: 3 [IU] via SUBCUTANEOUS

## 2012-03-17 MED ORDER — LISINOPRIL 20 MG PO TABS
20.0000 mg | ORAL_TABLET | Freq: Every day | ORAL | Status: DC
  Administered 2012-03-17 – 2012-03-27 (×11): 20 mg via ORAL
  Filled 2012-03-17 (×12): qty 1

## 2012-03-17 MED ORDER — LISINOPRIL-HYDROCHLOROTHIAZIDE 20-12.5 MG PO TABS: 1.0000 | ORAL_TABLET | Freq: Every day | ORAL | Status: DC

## 2012-03-17 MED ORDER — CLONIDINE HCL 0.1 MG PO TABS
0.1000 mg | ORAL_TABLET | Freq: Two times a day (BID) | ORAL | Status: DC
  Administered 2012-03-17 – 2012-03-19 (×4): 0.1 mg via ORAL
  Filled 2012-03-17 (×5): qty 1

## 2012-03-17 MED ORDER — HYDRALAZINE HCL 20 MG/ML IJ SOLN
10.0000 mg | Freq: Four times a day (QID) | INTRAMUSCULAR | Status: DC | PRN
  Filled 2012-03-17: qty 0.5

## 2012-03-17 MED ORDER — ALBUTEROL SULFATE (5 MG/ML) 0.5% IN NEBU
5.0000 mg | INHALATION_SOLUTION | Freq: Once | RESPIRATORY_TRACT | Status: AC
  Administered 2012-03-17: 5 mg via RESPIRATORY_TRACT
  Filled 2012-03-17: qty 1

## 2012-03-17 MED ORDER — PREDNISONE 20 MG PO TABS
40.0000 mg | ORAL_TABLET | Freq: Every day | ORAL | Status: DC
  Administered 2012-03-18 – 2012-03-19 (×2): 40 mg via ORAL
  Filled 2012-03-17 (×3): qty 2

## 2012-03-17 MED ORDER — LORAZEPAM 1 MG PO TABS
1.0000 mg | ORAL_TABLET | Freq: Four times a day (QID) | ORAL | Status: DC | PRN
  Administered 2012-03-17 – 2012-03-18 (×2): 1 mg via ORAL
  Filled 2012-03-17 (×2): qty 1

## 2012-03-17 MED ORDER — PANTOPRAZOLE SODIUM 40 MG PO TBEC
40.0000 mg | DELAYED_RELEASE_TABLET | Freq: Every day | ORAL | Status: DC
  Administered 2012-03-17 – 2012-03-27 (×11): 40 mg via ORAL
  Filled 2012-03-17 (×11): qty 1

## 2012-03-17 NOTE — ED Notes (Signed)
OZH:[id number]<HQ> Expected date:03/17/12<BR> Expected time: 1:26 PM<BR> Means of arrival:<BR> Comments:<BR> SHOB

## 2012-03-17 NOTE — ED Provider Notes (Signed)
History     CSN: [id number]  Arrival date & time 03/17/12  1340   First MD Initiated Contact with Patient 03/17/12 1409      Chief Complaint  Patient presents with  . Shortness of Breath    (Consider location/radiation/quality/duration/timing/severity/associated sxs/prior treatment) HPI Comments: Renee Donovan is a 78 y.o. female with a history of COPD (followed by Dr. Delford Field, [facility]), hypertension, and hyperlipidemia that presents emergency department complaining of worsened shortness of breath.  History is obtained both from patient and caregiver reports that patient has been requiring more nebulizer use to keep breathing at baseline.  Over the past couple of months she only required one treatment daily now she are 5 treatments daily and is still having difficulty breathing.  Patient is not on oxygen at home at baseline.  Patient does have an associated chronic cough and is a current everyday smoker, however her cough is productive of clear sputum which is unchanged.  EMS was called today after patient began to have accessory muscle use and retractions with breathing in acute respiratory distress.  When EMS arrived they treated patient with 125 Solu-Medrol, 10 mg albuterol and Atrovent.  Patient denies having fever, night sweats, chills, chest pain, PND, orthopnea.  The history is provided by the patient, a caregiver and a relative.    Past Medical History  Diagnosis Date  . Breast cancer 1991    s/p R lumpectomy and radiation   . Hyperlipidemia   . Hypertension   . DDD (degenerative disc disease)   . DJD (degenerative joint disease)     ankles  . Cluster headache     remote  . COPD (chronic obstructive pulmonary disease)     Golds Stage II feV1 63% 11/2009    Past Surgical History  Procedure Date  . Nose surgery 1993  . Chin lift [id number]  . Toe surgery 1965    5th toe- right foot  . Breast lumpectomy 1991    Right breast    Family History  Problem  Relation Age of Onset  . Coronary artery disease Father   . Heart attack Father   . Diabetes Father   . Breast cancer Mother     History  Substance Use Topics  . Smoking status: Current Every Day Smoker -- 0.5 packs/day for 60 years    Types: Cigarettes  . Smokeless tobacco: Never Used     Comment: e cigarrette///02/08/12  . Alcohol Use: Yes     Comment: 4 glasses of wine daily    OB History    Grav Para Term Preterm Abortions TAB SAB Ect Mult Living                  Review of Systems  Constitutional: Negative for fever, chills, diaphoresis, activity change and appetite change.  HENT: Negative for congestion and neck pain.   Eyes: Negative for visual disturbance.  Respiratory: Positive for cough, chest tightness, shortness of breath and wheezing.   Cardiovascular: Negative for chest pain, palpitations and leg swelling.  Gastrointestinal: Negative for abdominal pain.  Genitourinary: Negative for dysuria, urgency and frequency.  Musculoskeletal: Negative for myalgias.  Skin: Negative for color change and wound.  Neurological: Negative for dizziness, syncope, weakness, light-headedness, numbness and headaches.  Psychiatric/Behavioral: Negative for confusion.  All other systems reviewed and are negative.    Allergies  Review of patient's allergies indicates no known allergies.  Home Medications   Current Outpatient Rx  Name  Route  Sig  Dispense  Refill  . ALBUTEROL SULFATE (2.5 MG/3ML) 0.083% IN NEBU   Nebulization   Take 3 mLs (2.5 mg total) by nebulization every 6 (six) hours as needed for wheezing.   75 mL   6   . ALBUTEROL SULFATE HFA 108 (90 BASE) MCG/ACT IN AERS      1-2 puffs every  4-6 hours as needed   18 g   1   . CLONIDINE HCL 0.1 MG PO TABS   Oral   Take 1 tablet (0.1 mg total) by mouth 2 (two) times daily.   60 tablet   3   . DIPHENHYDRAMINE HCL 25 MG PO TABS   Oral   Take 50 mg by mouth 3 times/day as needed-between meals & bedtime.           Marland Kitchen FLUTICASONE FUROATE-VILANTEROL 100-25 MCG/INH IN AEPB   Inhalation   Inhale 1 puff into the lungs daily.   14 each   0   . LEVOTHYROXINE SODIUM 50 MCG PO TABS   Oral   Take 50 mcg by mouth daily.           Marland Kitchen LOVASTATIN 40 MG PO TABS   Oral   Take 40 mg by mouth daily.           Marland Kitchen VERAPAMIL HCL 240 MG PO TBCR   Oral   Take 1 tablet (240 mg total) by mouth 2 (two) times daily.   60 tablet   3   . ARFORMOTEROL TARTRATE 15 MCG/2ML IN NEBU      HOLD   120 mL      . BUDESONIDE 0.25 MG/2ML IN SUSP      HOLD   60 mL      . IBUPROFEN 200 MG PO TABS   Oral   Take 200 mg by mouth every 6 (six) hours as needed.           . METHOCARBAMOL 750 MG PO TABS   Oral   Take 750 mg by mouth as needed.           Marland Kitchen MIRABEGRON ER 25 MG PO TB24   Oral   Take 1 tablet (25 mg total) by mouth daily.   30 tablet   11     BP 211/83  Pulse 95  Temp 98.1 F (36.7 C) (Oral)  Resp 22  SpO2 87%  Physical Exam  Nursing note and vitals reviewed. Constitutional: She is oriented to person, place, and time. She appears well-developed and well-nourished.  HENT:  Head: Normocephalic and atraumatic.  Eyes: Conjunctivae normal and EOM are normal.  Neck: Normal range of motion.  Cardiovascular:       Mild tachycardia, intact distal pulses  Pulmonary/Chest:       Increased effort, patient in mild acute respiratory distress with accessory muscle use.  Speaking in broken sentences.  Expiratory wheezing throughout lung fields bilaterally.  Abdominal: Bowel sounds are normal.       Soft nontender  Musculoskeletal: Normal range of motion.  Neurological: She is alert and oriented to person, place, and time.  Skin: Skin is dry. No rash noted. She is not diaphoretic.       Dry flaky skin  Psychiatric: She has a normal mood and affect. Her behavior is normal.    ED Course  Procedures (including critical care time)  Labs Reviewed  BASIC METABOLIC PANEL - Abnormal; Notable for the  following:    Glucose, Bld 106 (*)  GFR calc non Af Amer 56 (*)     GFR calc Af Amer 65 (*)     All other components within normal limits  CBC - Abnormal; Notable for the following:    WBC 11.2 (*)     Hemoglobin 17.2 (*)     HCT 50.2 (*)     MCV 100.6 (*)     MCH 34.5 (*)     All other components within normal limits  PRO B NATRIURETIC PEPTIDE - Abnormal; Notable for the following:    Pro B Natriuretic peptide (BNP) 1403.0 (*)     All other components within normal limits  POCT I-STAT TROPONIN I  BLOOD GAS, ARTERIAL   Dg Chest 2 View  03/17/2012  *RADIOLOGY REPORT*  Clinical Data: Shortness of breath  CHEST - 2 VIEW  Comparison: 11/24/2008  Findings: The cardiac shadow is stable.  The lungs are clear bilaterally.Stable right basilar nodule is noted.  No infiltrate is seen.  The osseous structures are within normal limits. Postoperative changes in the right axilla are again noted  IMPRESSION: No acute abnormality noted. .   Original Report Authenticated By: Alcide Clever, M.D.     Date: 03/17/2012  Rate: 101  Rhythm: sinus tachycardia  QRS Axis: normal  Intervals: normal  ST/T Wave abnormalities: nonspecific T wave changes  Conduction Disutrbances:none and left anterior fascicular block  Narrative Interpretation:   Old EKG Reviewed: changes noted, New T wave inversion in V5V6, troponin ordered   No diagnosis found.    MDM  COPD exacerbation  78 year old female with a history of COPD followed by Dr. Delford Field at [facility] pulmonology presents emergency department for worsened shortness of breath over the last month with acute exacerbation over the last couple of days requiring increased nebulizer treatments.  Patient received 125 Solu-Medrol, 10 albuterol and 0.5 Atrovent from EMS in route to hospital.  On arrival patient was that the tachycardic with nasal cannula at 2 L.  On physical exam patient was having mild to moderate respiratory distress with accessory muscle use and  bilateral expiratory wheezing throughout lung fields.  Patient is to be admitted for observation.  Patient seen with attending Dr. Fredderick Phenix who agrees with treatment plan and has also seen patient.The patient appears reasonably stabilized for admission considering the current resources, flow, and capabilities available in the ED at this time, and I doubt any other [facility] requiring further screening and/or treatment in the ED prior to admission.         Jaci Carrel, [place] 03/17/12 1556

## 2012-03-17 NOTE — ED Notes (Signed)
Pt comes from home where she has a part-time caregiver (niece) with c/o SOB "for months."  Pt states latest episode worsened several days ago.  Pt c/o N/V and loss of appetite.  Pt reports subjective fever at home.  Pt's lung sounds are diminished in right upper lobe, expiratory wheezing in all other lobes.  Pt has good peripheral pulses.  Pt denies denies pain.

## 2012-03-17 NOTE — ED Notes (Signed)
Family called staff in saying that pt is "splotchy" and red. Pt is sweaty. PA Lisette in to see patient.

## 2012-03-17 NOTE — H&P (Addendum)
[facility] History and Physical  Renee Donovan:[id number] DOB: [birth year] DOA: 03/17/2012  Referring physician: Dr. Fredderick Phenix PCP: [name], MD  Specialists: Shan Levans (pulmonologist)  Chief Complaint: Shortness of breath.  HPI: Renee Donovan is a 78 y.o. female with past medical history significant for hyperlipidemia, hypertension, degenerative disc/joint disease, COPD and tobacco abuse; came to the hospital secondary to shortness of breath. Patient reports that over the last month she had been experiencing continuous shortness of breath with worsening over the last 2-3 days prior to admission. Patient has been using her nebulizer more often and today has noticed to experience difficulty catching her breath even when she was talking.  Patient has associated chronic cough unchanged (with clear sputum expectoration); she denies any fever, chills, chest pain, orthopnea, PND. Patient continued to smoke, but endorses cutting down in the amount of cigarettes per day. In the ED patient was found with hypoxia in the 87 range on room air, using accessory muscles, tachypneic (respiratory rate 33-35), on able to speak in full sentences. Chest x-ray demonstrated emphysematous changes but no acute infiltrates. WBCs 11.2; ABG with a pH of 7.458, CO2 41.4, PO2 58.6  Review of Systems:  Negative except as mentioned on history of present illness.  Past Medical History  Diagnosis Date  . Breast cancer 1991    s/p R lumpectomy and radiation   . Hyperlipidemia   . Hypertension   . DDD (degenerative disc disease)   . DJD (degenerative joint disease)     ankles  . Cluster headache     remote  . COPD (chronic obstructive pulmonary disease)     Golds Stage II feV1 63% 11/2009   Past Surgical History  Procedure Date  . Nose surgery 1993  . Chin lift [id number]  . Toe surgery 1965    5th toe- right foot  . Breast lumpectomy 1991    Right breast   Social History:  reports that she  has been smoking Cigarettes.  She has a 30 pack-year smoking history. She has never used smokeless tobacco. She reports that she drinks alcohol. She reports that she does not use illicit drugs. patient lives at home with caregiver and her granddaughter and grandson visiting her quite often to provide require assistance. She is not on oxygen at baseline but has been requiring assistance with her activities of daily living especially over the last month due to shortness of breath and limited mobility with her degenerative disc/joint disease.   No Known Allergies  Family History  Problem Relation Age of Onset  . Coronary artery disease Father   . Heart attack Father   . Diabetes Father   . Breast cancer Mother     Prior to Admission medications   Medication Sig Start Date End Date Taking? Authorizing Provider  albuterol (PROVENTIL) (2.5 MG/3ML) 0.083% nebulizer solution Take 3 mLs (2.5 mg total) by nebulization every 6 (six) hours as needed for wheezing. 02/08/12 02/07/13 Yes Storm Frisk, MD  albuterol (VENTOLIN HFA) 108 (90 BASE) MCG/ACT inhaler 1-2 puffs every  4-6 hours as needed 07/07/11  Yes Storm Frisk, MD  cloNIDine (CATAPRES) 0.1 MG tablet Take 1 tablet (0.1 mg total) by mouth 2 (two) times daily. 12/23/10  Yes Jacques Navy, MD  diphenhydrAMINE (BENADRYL) 25 MG tablet Take 50 mg by mouth 3 times/day as needed-between meals & bedtime.    Yes Historical Provider, MD  Fluticasone Furoate-Vilanterol (BREO ELLIPTA) 100-25 MCG/INH AEPB Inhale 1 puff into the lungs  daily. 03/09/12  Yes Storm Frisk, MD  levothyroxine (SYNTHROID, LEVOTHROID) 50 MCG tablet Take 50 mcg by mouth daily.     Yes Historical Provider, MD  lovastatin (MEVACOR) 40 MG tablet Take 40 mg by mouth daily.     Yes Historical Provider, MD  verapamil (CALAN-SR) 240 MG CR tablet Take 1 tablet (240 mg total) by mouth 2 (two) times daily. 12/23/10  Yes Jacques Navy, MD  arformoterol [facility]) 15 MCG/2ML NEBU HOLD  02/08/12   Storm Frisk, MD  budesonide (PULMICORT) 0.25 MG/2ML nebulizer solution HOLD 02/08/12   Storm Frisk, MD  ibuprofen (ADVIL,MOTRIN) 200 MG tablet Take 200 mg by mouth every 6 (six) hours as needed.      Historical Provider, MD  methocarbamol (ROBAXIN) 750 MG tablet Take 750 mg by mouth as needed.      Historical Provider, MD  Mirabegron ER 25 MG TB24 Take 1 tablet (25 mg total) by mouth daily. 07/27/11   Jacques Navy, MD   Physical Exam: Filed Vitals:   03/17/12 1520 03/17/12 1551 03/17/12 1642 03/17/12 1644  BP: 186/67 211/83 204/87 204/87  Pulse: 96 95 99 100  Temp:   98.8 F (37.1 C)   TempSrc:   Oral   Resp: 23 22 20 26   SpO2: 90% 87% 94% 93%     General:  Mild discomfort secondary to shortness of breath, difficulty to speak in full sentences; tachypneic and afebrile.  Eyes: No icterus, no nystagmus. PERRLA and with extraocular muscles intact.  ENT: No erythema or exudates inside her mouth, no drainage out of her ears or nostrils.  Neck: No JVD, no bruits, supple, no thyromegaly.  Cardiovascular: Mild tachycardia on exam, no rubs, no gallops  Respiratory: Distant breath sounds on auscultation, diffuse expiratory wheezes, no crackles; scattered rhonchi.  Abdomen: Soft, nontender/nondistended, positive bowel sounds.  Skin: Severe dry skin with areas of her back of skin peeling off. No rash, no open wounds. No petechiae.  Musculoskeletal: No joint swelling or erythema, full range of motion.  Psychiatric: No hallucinations, no suicidal ideation.  Neurologic: Alert, awake and oriented x3, and alert grossly intact, no focal motor or sensory deficit appreciated. Muscle strength equal bilaterally and symmetrically.  Labs on Admission:  Basic Metabolic Panel:  Lab 03/17/12 4540  NA 140  K 3.6  CL 97  CO2 30  GLUCOSE 106*  BUN 11  CREATININE 0.94  CALCIUM 9.3  MG --  PHOS --   CBC:  Lab 03/17/12 1410  WBC 11.2*  NEUTROABS --  HGB 17.2*  HCT  50.2*  MCV 100.6*  PLT 206   BNP (last 3 results)  Basename 03/17/12 1410  PROBNP 1403.0*    Radiological Exams on Admission: Dg Chest 2 View  03/17/2012  *RADIOLOGY REPORT*  Clinical Data: Shortness of breath  CHEST - 2 VIEW  Comparison: 11/24/2008  Findings: The cardiac shadow is stable.  The lungs are clear bilaterally.Stable right basilar nodule is noted.  No infiltrate is seen.  The osseous structures are within normal limits. Postoperative changes in the right axilla are again noted  IMPRESSION: No acute abnormality noted. .   Original Report Authenticated By: Alcide Clever, M.D.     EKG:  Date: 03/17/2012  Rate: 101  Rhythm: sinus tachycardia  QRS Axis: normal  Intervals: normal  ST/T Wave abnormalities: nonspecific T wave changes  Conduction Disutrbances:none and left anterior fascicular block  Narrative Interpretation:  Old EKG Reviewed: changes noted, New T wave inversion  in V5V6, troponin ordered   Assessment/Plan 1-shortness of breath and hypoxia: Secondary to COPD exacerbation.  -Will admit to step down unit (oxygen level on ABG is 58, patient with accessory muscles and mild retractions, tachypneic with respiratory rate 32-35 while resting on bed) -Start Solu Medrol IV 80 mg every 8 hours. -Albuterol/Atrovent nebulizer treatments. -Levaquin 500 mg IV daily -Flutter valve -Pulmonology service has been consulted will follow further recommendations.   2-hypertension: HM with history of noncompliance. Will continue clonidine and verapamil. PRN  Hydralazine  3-HYPERLIPIDEMIA: Will check lipid panel and continue statins.  4-CIGARETTE SMOKER: Counseling for smoking cessation has been provided. Nicotine patch has been ordered.  5-Hypothyroidism: Will continue Synthroid. We'll check TSH further adjustment to be done based on results.   6-hyperglycemia: Most likely secondary to the use of steroids. At this point will check hemoglobin A1c and will start patient on a  sliding scale insulin.   7-leukocytosis: Most likely associated with use of steroids/bronchitis/bronchiectasis. CBC in the morning to follow WBCs trend. Patient will be placed on Levaquin as part of a standard care for her COPD exacerbation.  8-Alcohol abuse: counseling provided. Has never experienced withdrawal. Will use CIWA protocol and start thiamine and folic acid.  DVT and GI prophylaxis: Lovenox and Protonix.   Code Status: Full code Family Communication: Grandson and granddaughter at bedside Disposition Plan: Will admit to step down, follow COPD protocol. Pulmonology service consulted; IV Solu-Medrol, antibiotics, albuterol/Atrovent nebulizer treatment.  Time spent: >30 minutes  Nemiah Kissner [facility] Pager [phone]  If 7PM-7AM, please contact night-coverage [email or web address] Password [email or web address] 03/17/2012, 5:31 PM

## 2012-03-17 NOTE — ED Notes (Signed)
Report called to [facility]

## 2012-03-17 NOTE — Consult Note (Signed)
Name: Renee Donovan MRN: [id number] DOB: [birth year]    LOS: 0  Referring Provider:  Vassie Loll  Reason for Referral:  COPD  PULMONARY / CRITICAL CARE MEDICINE  HPI:  Renee Donovan is a 78 y/o woman with pmh significant for Gold stage II COPD, HTN, and remote history of breast cancer who is seen in clinic by Dr. Delford Field for management of her COPD.  She states that about 9 months ago she began to get symptomatic from her COPD and has continued to slowly worsen since then.  The last few weeks she has had increasing cough and shortness of breath.  She complains that she has been waking up around 3am every morning to use her nebulizer.  She has also had post nasal drip which collects in the back of her throat and makes her gag.  This drainage is clear.  Her niece lives with her and is her care giver.  She has asthma but has also had an increase in cough and congestion over the last few weeks.  Renee Donovan has not had any fevers, chills or night sweats.  She does complain of an occasional "stitch" in her side that bothers her. She is not oxygen dependent.  She does continue to smoke.   Past Medical History  Diagnosis Date  . Breast cancer 1991    s/p R lumpectomy and radiation   . Hyperlipidemia   . Hypertension   . DDD (degenerative disc disease)   . DJD (degenerative joint disease)     ankles  . Cluster headache     remote  . COPD (chronic obstructive pulmonary disease)     Golds Stage II feV1 63% 11/2009   Past Surgical History  Procedure Date  . Nose surgery 1993  . Chin lift [id number]  . Toe surgery 1965    5th toe- right foot  . Breast lumpectomy 1991    Right breast   Prior to Admission medications   Medication Sig Start Date End Date Taking? Authorizing Provider  albuterol (PROVENTIL) (2.5 MG/3ML) 0.083% nebulizer solution Take 3 mLs (2.5 mg total) by nebulization every 6 (six) hours as needed for wheezing. 02/08/12 02/07/13 Yes Storm Frisk, MD  albuterol (VENTOLIN HFA)  108 (90 BASE) MCG/ACT inhaler 1-2 puffs every  4-6 hours as needed 07/07/11  Yes Storm Frisk, MD  cloNIDine (CATAPRES) 0.1 MG tablet Take 1 tablet (0.1 mg total) by mouth 2 (two) times daily. 12/23/10  Yes Jacques Navy, MD  Fluticasone Furoate-Vilanterol (BREO ELLIPTA) 100-25 MCG/INH AEPB Inhale 1 puff into the lungs daily. 03/09/12  Yes Storm Frisk, MD  levothyroxine (SYNTHROID, LEVOTHROID) 50 MCG tablet Take 50 mcg by mouth daily.     Yes Historical Provider, MD  lisinopril-hydrochlorothiazide (PRINZIDE,ZESTORETIC) 20-12.5 MG per tablet Take 1 tablet by mouth daily.   Yes Historical Provider, MD  verapamil (CALAN-SR) 240 MG CR tablet Take 1 tablet (240 mg total) by mouth 2 (two) times daily. 12/23/10  Yes Jacques Navy, MD   Allergies No Known Allergies  Family History Family History  Problem Relation Age of Onset  . Coronary artery disease Father   . Heart attack Father   . Diabetes Father   . Breast cancer Mother    Social History  reports that she has been smoking Cigarettes.  She has a 30 pack-year smoking history. She has never used smokeless tobacco. She reports that she drinks alcohol. She reports that she does not use illicit drugs.  Review  Of Systems:  A complete review of 13 systems was obtained and was negative except as stated in the HPI  Brief patient description:  78 y/o woman with COPD, increased shortness of breath and cough recently.  Events Since Admission: Admitted to step down status.   Current Status: stable  Vital Signs: Temp:  [97.3 F (36.3 C)-98.8 F (37.1 C)] 97.9 F (36.6 C) (12/21 2000) Pulse Rate:  [92-105] 92  (12/21 1925) Resp:  [20-28] 22  (12/21 1925) BP: (150-211)/(62-101) 150/101 mmHg (12/21 2241) SpO2:  [87 %-98 %] 94 % (12/21 2010) Weight:  [86.546 kg (190 lb 12.8 oz)] 86.546 kg (190 lb 12.8 oz) (12/21 1825)  Physical Examination: General:  Well appearing woman in no acute distress Neuro:  CNII-XII intact HEENT:  PERRL,  EOMI Neck:  Supple, no masses Cardiovascular:  NRRR, no mrg Lungs:  Bibasilar crackles, no wheezes Abdomen:  +BS, soft, NTND, no HSM Musculoskeletal:  No joint abnormalities Skin:  No rash  Principal Problem:  *COPD exacerbation Active Problems:  HYPERLIPIDEMIA  CIGARETTE SMOKER  HYPERTENSION  EMPHYSEMA gold D  COPD  Hypothyroidism  SOB (shortness of breath)  Hypoxia   ASSESSMENT AND PLAN  PULMONARY  Lab 03/17/12 1454  PHART 7.458*  PCO2ART 41.4  PO2ART 58.6*  HCO3 28.9*  O2SAT 90.8   Ventilator Settings:   CXR:  No lobar infiltrate ETT:  none  A:  Increased cough and shortness of breath in pt with COPD P:   Would decrease steroid dose to 40mg  prednisone daily and give for 7 days with 7 day taper.  Continue home inhalers Start flonase 2 sprays in each nostril daily for post nasal drip   CARDIOVASCULAR  Lab 03/17/12 1410  TROPONINI --  LATICACIDVEN --  PROBNP 1403.0*   ECG:  NSR Lines: none  A: Hypertension P:  Consider echo to eval for diastolic dysfunction which may cause pulmonary edema and contribute to shortness of breath, especially at night BNP somewhat elevated - consider trial of gentle diuresis  RENAL  Lab 03/17/12 1410  NA 140  K 3.6  CL 97  CO2 30  BUN 11  CREATININE 0.94  CALCIUM 9.3  MG --  PHOS --   Intake/Output      12/21 0701 - 12/22 0700   I.V. (mL/kg) 30 (0.3)   IV Piggyback 150   Total Intake(mL/kg) 180 (2.1)   Net +180        Foley:  none  A:  HTN P:   Continue home anti-hypertensives Monitor intake and output   HEMATOLOGIC  Lab 03/17/12 1410  HGB 17.2*  HCT 50.2*  PLT 206  INR --  APTT --   A:  Plethoric P:  Pt has had overnight oximetry study recently with no evidence of nocturnal desaturations.   INFECTIOUS  Lab 03/17/12 1410  WBC 11.2*  PROCALCITON --   Cultures: none Antibiotics: levaquin  A:  No obvious signs or symptoms of infection P:   Consider sputum cx     ENDOCRINE  Lab 03/17/12 2151  GLUCAP 240*   A:  At risk for hyperglycemia while on steroids   P:   Monitor glucose daily on BMP, if elevated start SSI and glucose checks QAC/HS  NEUROLOGIC  A:  History of alcohol use/abuse P:   At risk for withdrawal On CIWA  BEST PRACTICE / DISPOSITION Level of Care:  stepdown Primary Service:  hospitalist Consultants:  pulmonary Code Status:  full Diet:  regular DVT Px:  heparin  GI Px:  Not indicated Skin Integrity:  good Social / Family:  Lives with niece who is her caregiver  Carolan Clines., M.D. Pulmonary and Critical Care Medicine [facility] Pager: [phone]  03/17/2012, 11:22 PM

## 2012-03-17 NOTE — Progress Notes (Signed)
Attempted to draw ABG on room air x2 waiting 20 minutes each attempt.  Unable to stay on RA due to desaturations.

## 2012-03-17 NOTE — ED Notes (Signed)
Per EMS - Pt comes from home.  Pt has had SOB x2 weeks, worsening today.  Pt has wheezes and diminished lung sounds.  O2 sats 93% on RA after receiving a home neb treatment.  Pt has received total of 10 mg Albuterol and 0.5 mg Atrovent from EMS.  Pt also received 125 mg of Solumedrol IV.  Pt has 20 gauge in left forearm.  Pt speaking in complete sentences, color good.  Pt states she has had intermittent fever with nausea, but denies vomiting.  Pt has hx of HTN and has not taken her meds today.  Her BP on scene was 228/114.  12 lead unremarkable.

## 2012-03-17 NOTE — ED Notes (Signed)
Pt family member informed this RN that patient is a daily drinker. Informed admitting MD of this.

## 2012-03-18 DIAGNOSIS — J441 Chronic obstructive pulmonary disease with (acute) exacerbation: Principal | ICD-10-CM

## 2012-03-18 LAB — LIPID PANEL
Cholesterol: 196 mg/dL (ref 0–200)
HDL: 79 mg/dL (ref >39)
Triglycerides: 78 mg/dL (ref <150)
VLDL: 16 mg/dL (ref 0–40)

## 2012-03-18 LAB — TSH: TSH: 1.402 u[IU]/mL (ref 0.350–4.500)

## 2012-03-18 LAB — GLUCOSE, CAPILLARY
Glucose-Capillary: 146 mg/dL — ABNORMAL HIGH (ref 70–99)
Glucose-Capillary: 168 mg/dL — ABNORMAL HIGH (ref 70–99)
Glucose-Capillary: 156 mg/dL — ABNORMAL HIGH (ref 70–99)

## 2012-03-18 MED ORDER — BIOTENE DRY MOUTH MT LIQD
15.0000 mL | Freq: Two times a day (BID) | OROMUCOSAL | Status: DC
  Administered 2012-03-18 – 2012-03-27 (×19): 15 mL via OROMUCOSAL

## 2012-03-18 MED ORDER — ZOLPIDEM TARTRATE 5 MG PO TABS
5.0000 mg | ORAL_TABLET | Freq: Every evening | ORAL | Status: DC | PRN
  Administered 2012-03-18 – 2012-03-26 (×9): 5 mg via ORAL
  Filled 2012-03-18 (×10): qty 1

## 2012-03-18 MED ORDER — LEVOFLOXACIN 500 MG PO TABS
500.0000 mg | ORAL_TABLET | Freq: Every day | ORAL | Status: DC
  Administered 2012-03-18 – 2012-03-19 (×2): 500 mg via ORAL
  Filled 2012-03-18 (×3): qty 1

## 2012-03-18 MED ORDER — MENTHOL 3 MG MT LOZG
1.0000 | LOZENGE | OROMUCOSAL | Status: DC | PRN
  Administered 2012-03-18: 3 mg via ORAL
  Filled 2012-03-18: qty 9

## 2012-03-18 NOTE — ED Provider Notes (Signed)
Medical screening examination/treatment/procedure(s) were conducted as a shared visit with non-physician practitioner(s) and myself.  I personally evaluated the patient during the encounter   Rolan Bucco, MD 03/18/12 [phone]

## 2012-03-18 NOTE — Progress Notes (Signed)
Patient transferring to room 1313.  Patient to travel by wheelchair.  Will continue to monitor.

## 2012-03-18 NOTE — Consult Note (Signed)
Name: SWATI GRANBERRY MRN: [id number] DOB: [birth year]    LOS: 1  Referring Provider:  Vassie Loll  Reason for Referral:  COPD  PULMONARY / CRITICAL CARE MEDICINE  HPI:   78 y/o woman with pmh significant for Gold stage II COPD, HTN, and remote history of breast cancer who is seen in clinic by Dr. Delford Field for management of her COPD.  She states that about 9 months ago she began to get symptomatic from her COPD and has continued to slowly worsen since then.  The last few weeks she has had increasing cough and shortness of breath.  She complains that she has been waking up around 3am every morning to use her nebulizer.  She has also had post nasal drip which collects in the back of her throat and makes her gag.  This drainage is clear.  Her niece lives with her and is her care giver.  She has asthma but has also had an increase in cough and congestion over the last few weeks.  Ms. Stumpp has not had any fevers, chills or night sweats.  She does complain of an occasional "stitch" in her side that bothers her. She is not oxygen dependent.  She does continue to smoke even with the e-cig  Current Status: stable, oob, breathing OK, wants to go home  Vital Signs: Temp:  [97.3 F (36.3 C)-98.8 F (37.1 C)] 97.5 F (36.4 C) (12/22 0800) Pulse Rate:  [72-105] 78  (12/22 1000) Resp:  [18-28] 25  (12/22 1000) BP: (144-211)/(62-129) 144/78 mmHg (12/22 1000) SpO2:  [87 %-98 %] 97 % (12/22 1000) Weight:  [86.546 kg (190 lb 12.8 oz)] 86.546 kg (190 lb 12.8 oz) (12/21 1825)  Physical Examination: General:  Well appearing woman in no acute distress Neuro:  CNII-XII intact HEENT:  PERRL, EOMI Neck:  Supple, no masses Cardiovascular:  NRRR, no mrg Lungs:  Bibasilar crackles, no wheezes, faint sacttered rhonchi Abdomen:  +BS, soft, NTND, no HSM Musculoskeletal:  No joint abnormalities Skin:  No rash  Principal Problem:  *COPD exacerbation Active Problems:  HYPERLIPIDEMIA  CIGARETTE SMOKER  HYPERTENSION  EMPHYSEMA gold D  COPD  Hypothyroidism  SOB (shortness of breath)  Hypoxia   ASSESSMENT AND PLAN  PULMONARY  Lab 03/17/12 1454  PHART 7.458*  PCO2ART 41.4  PO2ART 58.6*  HCO3 28.9*  O2SAT 90.8   Ventilator Settings:   CXR:  No lobar infiltrate ETT:  none  A:   COPD flare P:   Would decrease steroid dose to 40mg  prednisone daily and give for 7 days with 7 day taper. (2 wks total) Continue home inhalers Start flonase 2 sprays in each nostril daily for post nasal drip Ct nebs OK to dc if she is really insistent - chk RA satn Pt has had overnight oximetry study recently with no evidence of nocturnal desaturations.- note poltycythemia   CARDIOVASCULAR  Lab 03/17/12 1410  TROPONINI --  LATICACIDVEN --  PROBNP 1403.0*   ECG:  NSR Lines: none  A: Hypertension P:  Consider echo to eval for diastolic dysfunction which may cause pulmonary edema and contribute to shortness of breath, especially at night BNP somewhat elevated - consider trial of gentle diuresis  If stays in , transfer to floor PCCM to s ign off - she has FU with dr Edmonia James., M.D. Pulmonary and Critical Care Medicine [facility] Pager: [phone]  03/18/2012, 10:16 AM

## 2012-03-18 NOTE — Progress Notes (Signed)
Subjective: Patient with COPD and continues smoke. She was admitted for increased SOB/DOE.She does c/o SOB. Objective: Lab: Lab Results  Component Value Date   WBC 11.2* 03/17/2012   HGB 17.2* 03/17/2012   HCT 50.2* 03/17/2012   MCV 100.6* 03/17/2012   PLT 206 03/17/2012   BMET    Component Value Date/Time   NA 140 03/17/2012 1410   K 3.6 03/17/2012 1410   CL 97 03/17/2012 1410   CO2 30 03/17/2012 1410   GLUCOSE 106* 03/17/2012 1410   BUN 11 03/17/2012 1410   CREATININE 0.94 03/17/2012 1410   CALCIUM 9.3 03/17/2012 1410   GFRNONAA 56* 03/17/2012 1410   GFRAA 65* 03/17/2012 1410     Imaging:no new imaging  Scheduled Meds:   . albuterol  2.5 mg Nebulization Q6H  . antiseptic oral rinse  15 mL Mouth Rinse BID  . cloNIDine  0.1 mg Oral BID  . enoxaparin (LOVENOX) injection  40 mg Subcutaneous Q24H  . folic acid  1 mg Oral Daily  . lisinopril  20 mg Oral Daily   And  . hydrochlorothiazide  12.5 mg Oral Daily  . insulin aspart  0-15 Units Subcutaneous TID WC  . insulin aspart  0-5 Units Subcutaneous QHS  . ipratropium  0.5 mg Nebulization Q6H  . levofloxacin (LEVAQUIN) IV  500 mg Intravenous Q24H  . levothyroxine  50 mcg Oral QAC breakfast  . multivitamin with minerals  1 tablet Oral Daily  . nicotine  14 mg Transdermal Daily  . pantoprazole  40 mg Oral Daily  . predniSONE  40 mg Oral Q breakfast  . thiamine  100 mg Oral Daily   Or  . thiamine  100 mg Intravenous Daily  . verapamil  240 mg Oral BID   Continuous Infusions:   . sodium chloride     PRN Meds:.acetaminophen, acetaminophen, ALPRAZolam, hydrALAZINE, HYDROcodone-acetaminophen, LORazepam, LORazepam, menthol-cetylpyridinium, methocarbamol, morphine injection, ondansetron (ZOFRAN) IV, ondansetron   Physical Exam: Filed Vitals:   03/18/12 1200  BP: 122/54  Pulse: 67  Temp:   Resp: 21  Gen'l- WNWD white woman in no distress HEENT- C&S clear Cor RRR Pulm - decreased BS, no wheezing, no rales Neuro  - A&O x 3     Assessment/Plan: 1. PUlm - progressive emphysema with dyspnea and limitations in activity  Plan Transfer to med surg bed  Continuous pulse oximetry  Case manager consult for home O2    [employer] [facility] (o) [phone]; (c) [phone] Call-grp - [facility]  Tele: [phone]  03/18/2012, 12:54 PM

## 2012-03-19 DIAGNOSIS — F1010 Alcohol abuse, uncomplicated: Secondary | ICD-10-CM

## 2012-03-19 LAB — GLUCOSE, CAPILLARY
Glucose-Capillary: 126 mg/dL — ABNORMAL HIGH (ref 70–99)
Glucose-Capillary: 215 mg/dL — ABNORMAL HIGH (ref 70–99)

## 2012-03-19 MED ORDER — CLONIDINE HCL 0.1 MG PO TABS
0.1000 mg | ORAL_TABLET | ORAL | Status: DC | PRN
  Administered 2012-03-20 – 2012-03-26 (×4): 0.1 mg via ORAL
  Filled 2012-03-19 (×3): qty 1

## 2012-03-19 MED ORDER — LORAZEPAM 2 MG/ML IJ SOLN: 0.5000 mg | Freq: Four times a day (QID) | INTRAMUSCULAR | Status: AC | PRN

## 2012-03-19 MED ORDER — LORAZEPAM 1 MG PO TABS: 1.0000 mg | ORAL_TABLET | Freq: Four times a day (QID) | ORAL | Status: AC | PRN

## 2012-03-19 MED ORDER — PREDNISONE 20 MG PO TABS
30.0000 mg | ORAL_TABLET | Freq: Two times a day (BID) | ORAL | Status: DC
  Administered 2012-03-19: 30 mg via ORAL
  Filled 2012-03-19 (×4): qty 1

## 2012-03-19 NOTE — Evaluation (Signed)
Physical Therapy Evaluation Patient Details Name: Renee Donovan MRN: [id number] DOB: 1931/01/11 Today's Date: 03/19/2012 Time: 4782-9562 PT Time Calculation (min): 17 min  PT Assessment / Plan / Recommendation Clinical Impression  Pt admitted for COPD exacerbation.  Pt reports she does not use oxygen at home but will likely need it upon d/c.  Pt would benefit from acute PT services in order to improve independence with transfers and ambulation and increase activity tolerance while monitoring SaO2 and dyspnea to prepare for d/c home.  Pt with limited distance ambulation and 3/4 dyspnea with ambulation.  RN notified of pt having trouble breathing through nose (pt reports she has a cold) and also reports cold oxygen air so suggested humidifer for oxygen.    PT Assessment  Patient needs continued PT services    Follow Up Recommendations  Home health PT;Supervision/Assistance - 24 hour    Does the patient have the potential to tolerate intense rehabilitation      Barriers to Discharge        Equipment Recommendations  None recommended by PT    Recommendations for Other Services     Frequency Min 3X/week    Precautions / Restrictions Precautions Precautions: Fall Precaution Comments: monitor sats   Pertinent Vitals/Pain SaO2 at rest 2L oxygen 95% SaO2 2L [place] ambulating 85% SaO2 2L 93% upon leaving room      Mobility  Bed Mobility Bed Mobility: Supine to Sit Supine to Sit: 4: Min assist;HOB elevated;With rails Details for Bed Mobility Assistance: EOB with OT upon entering Transfers Transfers: Stand to Sit;Sit to Stand Sit to Stand: 4: Min assist;With upper extremity assist;From bed;From elevated surface Stand to Sit: 4: Min assist;With upper extremity assist;To chair/3-in-1 Details for Transfer Assistance: assist to rise and guide descent as pt trying to sit while turning to chair, verbal cues for hand placement Ambulation/Gait Ambulation/Gait Assistance: 4: Min  guard Ambulation Distance (Feet): 60 Feet (total) Assistive device: Rolling walker Ambulation/Gait Assistance Details: ambulated 30 feet then required pursed lip breathing rest break standing by handrail, SaO2 dropped to 85% upon return to room on 2L oxygen (ambulated on 2L oxygen), pt very SOB upon sitting in recliner and cued for pursed lip breathing Gait Pattern: Step-through pattern;Trunk flexed Gait velocity: decreased    Shoulder Instructions     Exercises     PT Diagnosis: Difficulty walking  PT Problem List: Decreased strength;Decreased activity tolerance;Decreased mobility;Cardiopulmonary status limiting activity PT Treatment Interventions: DME instruction;Gait training;Functional mobility training;Therapeutic activities;Therapeutic exercise;Patient/family education   PT Goals Acute Rehab PT Goals PT Goal Formulation: With patient Time For Goal Achievement: 04/02/12 Potential to Achieve Goals: Good Pt will go Supine/Side to Sit: with modified independence PT Goal: Supine/Side to Sit - Progress: Goal set today Pt will go Sit to Supine/Side: with modified independence PT Goal: Sit to Supine/Side - Progress: Goal set today Pt will go Sit to Stand: with modified independence PT Goal: Sit to Stand - Progress: Goal set today Pt will go Stand to Sit: with modified independence PT Goal: Stand to Sit - Progress: Goal set today Pt will Ambulate: with supervision;51 - 150 feet;with rolling walker;Other (comment) (SaO2 >90%) PT Goal: Ambulate - Progress: Goal set today Pt will Perform Home Exercise Program: with supervision, verbal cues required/provided PT Goal: Perform Home Exercise Program - Progress: Goal set today  Visit Information  Last PT Received On: 03/19/12 Assistance Needed: +2 (lines/oxygen) PT/OT Co-Evaluation/Treatment: Yes    Subjective Data  Subjective: These are my grandchildren. Patient Stated Goal: home  Prior Functioning  Home Living Lives With:  Alone Available Help at Discharge: Family;Friend(s);Available PRN/intermittently Type of Home: House Home Access: Stairs to enter [employer] of Steps: 1 Home Layout: One level Bathroom Shower/Tub: Health visitor: Standard Home Adaptive Equipment: Hand-held shower hose;Shower chair with back;Walker - four wheeled;Wheelchair - manual (rollator) Prior Function Level of Independence: Needs assistance Needs Assistance: Bathing;Dressing;Meal Prep;Light Housekeeping Bath: Minimal Dressing: Minimal (socks and shoes ) Meal Prep: Total Light Housekeeping: Total Able to Take Stairs?: No Driving: No Vocation: Retired Comments: ambulates with rollator Communication Communication: No difficulties Dominant Hand: Right    Cognition  Overall Cognitive Status: Appears within functional limits for tasks assessed/performed Arousal/Alertness: Awake/alert Orientation Level: Appears intact for tasks assessed Behavior During Session: [facility] for tasks performed    Extremity/Trunk Assessment Right Upper Extremity Assessment RUE ROM/Strength/Tone: [facility] for tasks assessed Left Upper Extremity Assessment LUE ROM/Strength/Tone: [facility] for tasks assessed Right Lower Extremity Assessment RLE ROM/Strength/Tone: [facility] for tasks assessed Left Lower Extremity Assessment LLE ROM/Strength/Tone: WFL for tasks assessed   Balance Static Sitting Balance Static Sitting - Balance Support: No upper extremity supported;Feet supported Static Sitting - Level of Assistance: 5: Stand by assistance Static Standing Balance Static Standing - Balance Support: Bilateral upper extremity supported;During functional activity Static Standing - Level of Assistance: 4: Min assist  End of Session PT - End of Session Equipment Utilized During Treatment: Oxygen Activity Tolerance: Patient limited by fatigue;Other (comment) (dyspnea on exertion) Patient left: in chair;with call bell/phone within reach;with  family/visitor present Nurse Communication: Other (comment) (humidifier for oxygen?)  GP     Chemere Steffler,KATHrine E 03/19/2012, 12:36 PM Pager: [phone]

## 2012-03-19 NOTE — Evaluation (Signed)
Occupational Therapy Evaluation Patient Details Name: Renee Donovan MRN: [id number] DOB: 07/21/1930 Today's Date: 03/19/2012 Time: 1137-1206 OT Time Calculation (min): 29 min  OT Assessment / Plan / Recommendation Clinical Impression  Pt is a 78 yo female who presents with COPD exacerbation, decreasing O2 sats with activity even on 2L of O2. Skilled OT indicated to address the below stated problems in prep for safe d/c home with prn A from family and HHOT.    OT Assessment  Patient needs continued OT Services    Follow Up Recommendations  Home health OT    Barriers to Discharge Decreased caregiver support    Equipment Recommendations  3 in 1 bedside comode    Recommendations for Other Services    Frequency  Min 2X/week    Precautions / Restrictions Precautions Precautions: Fall Precaution Comments: watch O2 sats with mobility. Restrictions Weight Bearing Restrictions: No   Pertinent Vitals/Pain No c/o pain but O2 sats fall into mid 80s on 2L of O2.    ADL  Grooming: Wash/dry face;Brushing hair;Set up Where Assessed - Grooming: Unsupported sitting Upper Body Bathing: Set up Where Assessed - Upper Body Bathing: Unsupported sitting Lower Body Bathing: Moderate assistance Where Assessed - Lower Body Bathing: Supported sit to stand Upper Body Dressing: Set up Where Assessed - Upper Body Dressing: Unsupported sitting Lower Body Dressing: Moderate assistance Where Assessed - Lower Body Dressing: Supported sit to Pharmacist, hospital: Minimal assistance Statistician Method: Sit to Barista: Other (comment) (to recliner.) Toileting - Clothing Manipulation and Hygiene: Minimal assistance Where Assessed - Glass blower/designer Manipulation and Hygiene: Standing Equipment Used: Rolling walker Transfers/Ambulation Related to ADLs: Pt ambulated in hallway with O2 sats dropping to 85% on 2L. Pt having difficulty with PLB 2* nasal congestion. ADL  Comments: Pt fatigues very quickly. Max time and effort needed for all functional tasks.    OT Diagnosis: Generalized weakness  OT Problem List: Decreased activity tolerance;Decreased safety awareness;Cardiopulmonary status limiting activity;Decreased knowledge of use of DME or AE OT Treatment Interventions: Self-care/ADL training;Therapeutic activities;DME and/or AE instruction;Patient/family education;Energy conservation   OT Goals Acute Rehab OT Goals OT Goal Formulation: With patient Time For Goal Achievement: 04/02/12 Potential to Achieve Goals: Good ADL Goals Pt Will Perform Grooming: with supervision;Standing at sink;Sitting at sink ADL Goal: Grooming - Progress: Goal set today Pt Will Perform Lower Body Bathing: with min assist;Sit to stand from chair;Sit to stand from bed ADL Goal: Lower Body Bathing - Progress: Goal set today Pt Will Perform Lower Body Dressing: with min assist;Sit to stand from bed;Sit to stand from chair ADL Goal: Lower Body Dressing - Progress: Goal set today Pt Will Transfer to Toilet: with min assist;Ambulation;with DME ADL Goal: Toilet Transfer - Progress: Goal set today Pt Will Perform Toileting - Clothing Manipulation: with supervision;Sitting on 3-in-1 or toilet;Standing ADL Goal: Toileting - Clothing Manipulation - Progress: Goal set today Pt Will Perform Toileting - Hygiene: with supervision;Sit to stand from 3-in-1/toilet ADL Goal: Toileting - Hygiene - Progress: Goal set today Additional ADL Goal #1: Pt will verbalize and incorporate 3 energy conservation strategies into ADL activity without prompting. ADL Goal: Additional Goal #1 - Progress: Goal set today  Visit Information  Last OT Received On: 03/19/12 Assistance Needed: +1 (+2 for ambulation 2* lines and O2) PT/OT Co-Evaluation/Treatment: Yes    Subjective Data  Subjective: I can't breathe through my nose. I have a cold. Patient Stated Goal: Go home tomorrow.   Prior Functioning  Home Living Lives With: Alone Available Help at Discharge: Family;Friend(s);Available PRN/intermittently Type of Home: House Home Access: Stairs to enter [employer] of Steps: 1 Home Layout: One level Bathroom Shower/Tub: Health visitor: Standard Home Adaptive Equipment: Hand-held shower hose;Shower chair with back;Walker - four wheeled;Wheelchair - manual Prior Function Level of Independence: Needs assistance Needs Assistance: Bathing;Dressing;Meal Prep;Light Housekeeping Bath: Minimal Dressing: Minimal (socks and shoes ) Meal Prep: Total Light Housekeeping: Total Able to Take Stairs?: No Driving: No Vocation: Retired Musician: No difficulties Dominant Hand: Right         Vision/Perception     Cognition  Overall Cognitive Status: Appears within functional limits for tasks assessed/performed Arousal/Alertness: Awake/alert Orientation Level: Appears intact for tasks assessed Behavior During Session: [facility] for tasks performed    Extremity/Trunk Assessment Right Upper Extremity Assessment RUE ROM/Strength/Tone: [facility] for tasks assessed Left Upper Extremity Assessment LUE ROM/Strength/Tone: WFL for tasks assessed     Mobility Bed Mobility Bed Mobility: Supine to Sit Supine to Sit: 4: Min assist;HOB elevated;With rails Details for Bed Mobility Assistance: Min A needed to bring trunk to upright position. Transfers Transfers: Sit to Stand;Stand to Sit Sit to Stand: 4: Min assist;With upper extremity assist;From bed Stand to Sit: 4: Min assist;With upper extremity assist;With armrests;To chair/3-in-1 Details for Transfer Assistance: Min A needed to rise, stabilize self and control descent. Min cues for hand placement and safety.     Shoulder Instructions     Exercise     Balance Static Sitting Balance Static Sitting - Balance Support: No upper extremity supported;Feet supported Static Sitting - Level of Assistance: 5: Stand  by assistance Static Standing Balance Static Standing - Balance Support: Bilateral upper extremity supported;During functional activity Static Standing - Level of Assistance: 4: Min assist   End of Session OT - End of Session Activity Tolerance: Patient limited by fatigue Patient left: in chair;with call bell/phone within reach;with family/visitor present  GO     Kem Hensen A OTR/L [phone] 03/19/2012, 12:18 PM

## 2012-03-19 NOTE — Clinical Documentation Improvement (Signed)
RESPIRATORY FAILURE DOCUMENTATION CLARIFICATION QUERY   THIS DOCUMENT IS NOT A PERMANENT PART OF THE MEDICAL RECORD  TO RESPOND TO THE THIS QUERY, FOLLOW THE INSTRUCTIONS BELOW:  1. If needed, update documentation for the patient's encounter via the notes activity.  2. Access this query again and click edit on the In [employer].  3. After updating, or not, click F2 to complete all highlighted (required) fields concerning your review. Select "additional documentation in the medical record" OR "no additional documentation provided".  4. Click Sign note button.  5. The deficiency will fall out of your In Basket *Please let us know if you are not able to complete this workflow by phone or e-mail (listed below).  Please update your documentation within the medical record to reflect your response to this query.                                                                                    03/19/12  Dear Dr.NORINS, M/Associates,  In a better effort to capture your patient's severity of illness, reflect appropriate length of stay and utilization of resources, a review of the patient medical record has revealed the following indicators.    Based on your clinical judgment, please clarify and document in a progress note and/or discharge summary the clinical condition associated with the following supporting information:  In responding to this query please exercise your independent judgment.  The fact that a query is asked, does not imply that any particular answer is desired or expected.  Based on your clinical judgment can you provide a diagnosis that represents the below listed clinical indicators?  In this patient admitted with COPD exacerbation a review of the medical record reveals the following:   SOB per ED note and HP  Hypoxia in 87 range on room air per HP  Using accessory muscles Per HP  Difficulty catching breath while speaking per HP  Tachypneic (33-35)  ABG abnormal  (see below)  Treatment: Solumedrol Albuterol Levaquin  Clarification Needed   Please clarify if COPD exacerbation can be further specified as one of the diagnoses listed below and document in pn and d/c summary.    Possible Clinical Conditions?  _______Acute Respiratory Failure _______Acute on Chronic Respiratory Failure _______Chronic Respiratory Failure _______Acute Respiratory Insufficiency _______Acute Respiratory Insufficiency following surgery or trauma _______Other Condition________________ _______Cannot Clinically Determine    Supporting Information:  Risk Factors: COPD exacerbation, SOB, cigarette smoker,  Signs&Symptoms:   Diagnostics: ABG's     Component     Latest Ref Rng 03/17/2012          pH, Arterial     7.350 - 7.450 7.458 (H)  pO2, Arterial     80.0 - 100.0 mmHg 58.6 (L)  Bicarbonate     20.0 - 24.0 mEq/L 28.9 (H)  Acid-Base Excess     0.0 - 2.0 mmol/L 4.9 (H)   Lab:  Radiology:  Treatment: Oxygen: O2 concentrations:2L/[place]  O2 Mode: (Nasal cannula)              You may use possible, probable, or suspect with inpatient documentation. possible, probable, suspected diagnoses MUST be documented at the time of discharge  Reviewed:  no additional documentation provided ljh  Thank You,  Enis Slipper  BSN, MSN/Inf, CCDS Clinical Documentation Specialist [facility] Dept Pager: [phone]  Health Information Management [place]

## 2012-03-19 NOTE — Progress Notes (Signed)
Room Air O2 Saturations:   Rest: 88% Talking: 85% Ambulation: 69%

## 2012-03-19 NOTE — Progress Notes (Signed)
INITIAL NUTRITION ASSESSMENT  DOCUMENTATION CODES Per approved criteria  -Obesity Unspecified   INTERVENTION: - Hopefully as pain with eating controlled, intake will improve - Encouraged increased intake as pt feels better - RD to monitor intake  NUTRITION DIAGNOSIS: Inadequate oral intake related to pain with eating/coughing and poor appetite as evidenced by pt statement.   Goal: 1. Resolution of pain with eating 2. Pt to consume >90% of meals  Monitor:  Weighs, labs, intake, nausea/vomiting, pain with eating  Reason for Assessment: Consult  78 y.o. female  Admitting Dx: COPD exacerbation  ASSESSMENT: Pt reports eating only 1 meal/day for the past 2 weeks r/t ongoing nausea/vomiting. Pt reports drinking 16 ounces of red wine/day - pt getting folic acid, thiamine, and multivitamin. Pt reports unintentional 10 pound weight gain in the past few weeks which pt attributes to fluid weight gain. Pt reports appetite still poor in admission r/t pain with eating and coughing. Pt denies any vomiting for the past 2 days. Pt declined wanting any nutritional supplements at this time.   Height: Ht Readings from Last 1 Encounters:  03/18/12 5\' 6"  (1.676 m)    Weight: Wt Readings from Last 1 Encounters:  03/18/12 194 lb 14.2 oz (88.4 kg)    Ideal Body Weight: 130 lb  % Ideal Body Weight: 149  Wt Readings from Last 10 Encounters:  03/18/12 194 lb 14.2 oz (88.4 kg)  07/21/11 188 lb (85.276 kg)  12/07/10 180 lb 3.2 oz (81.738 kg)  10/06/10 183 lb 6.4 oz (83.19 kg)  01/20/10 186 lb 4 oz (84.482 kg)  01/01/10 185 lb (83.915 kg)  12/08/09 185 lb (83.915 kg)  12/01/09 186 lb (84.369 kg)  11/24/08 182 lb (82.555 kg)  11/13/07 184 lb (83.462 kg)    Usual Body Weight: 184 lb per pt report  % Usual Body Weight: 105  BMI:  Body mass index is 31.46 kg/(m^2). Class I obesity  Estimated Nutritional Needs: Kcal: 1400-1650 Protein: 60-70g Fluid: > 1.6L/day   Diet Order:  General  EDUCATION NEEDS: -No education needs identified at this time   Intake/Output Summary (Last 24 hours) at 03/19/12 1349 Last data filed at 03/19/12 0524  Gross per 24 hour  Intake    221 ml  Output      0 ml  Net    221 ml    Last BM: 12/20  Labs:   Lab 03/17/12 1410  NA 140  K 3.6  CL 97  CO2 30  BUN 11  CREATININE 0.94  CALCIUM 9.3  MG --  PHOS --  GLUCOSE 106*    CBG (last 3)   Basename 03/19/12 1205 03/19/12 0735 03/18/12 2152  GLUCAP 126* 147* 208*    Scheduled Meds:   . albuterol  2.5 mg Nebulization Q6H  . antiseptic oral rinse  15 mL Mouth Rinse BID  . enoxaparin (LOVENOX) injection  40 mg Subcutaneous Q24H  . folic acid  1 mg Oral Daily  . lisinopril  20 mg Oral Daily   And  . hydrochlorothiazide  12.5 mg Oral Daily  . insulin aspart  0-15 Units Subcutaneous TID WC  . insulin aspart  0-5 Units Subcutaneous QHS  . ipratropium  0.5 mg Nebulization Q6H  . levofloxacin  500 mg Oral Daily  . levothyroxine  50 mcg Oral QAC breakfast  . multivitamin with minerals  1 tablet Oral Daily  . nicotine  14 mg Transdermal Daily  . pantoprazole  40 mg Oral Daily  . predniSONE  30 mg Oral BID WC  . thiamine  100 mg Oral Daily   Or  . thiamine  100 mg Intravenous Daily  . verapamil  240 mg Oral BID    Continuous Infusions:   . sodium chloride 20 mL/hr (03/18/12 1814)    Past Medical History  Diagnosis Date  . Breast cancer 1991    s/p R lumpectomy and radiation   . Hyperlipidemia   . Hypertension   . DDD (degenerative disc disease)   . DJD (degenerative joint disease)     ankles  . Cluster headache     remote  . COPD (chronic obstructive pulmonary disease)     Golds Stage II feV1 63% 11/2009    Past Surgical History  Procedure Date  . Nose surgery 1993  . Chin lift [id number]  . Toe surgery 1965    5th toe- right foot  . Breast lumpectomy 1991    Right breast     Levon Hedger MS, RD, LDN [phone] Pager [phone] After Hours  Pager

## 2012-03-19 NOTE — Progress Notes (Signed)
Subjective: Renee Donovan does complain of cough and increased SOB. She has had increased WOB  Objective: Lab: Lab Results  Component Value Date   WBC 11.2* 03/17/2012   HGB 17.2* 03/17/2012   HCT 50.2* 03/17/2012   MCV 100.6* 03/17/2012   PLT 206 03/17/2012   BMET    Component Value Date/Time   NA 140 03/17/2012 1410   K 3.6 03/17/2012 1410   CL 97 03/17/2012 1410   CO2 30 03/17/2012 1410   GLUCOSE 106* 03/17/2012 1410   BUN 11 03/17/2012 1410   CREATININE 0.94 03/17/2012 1410   CALCIUM 9.3 03/17/2012 1410   GFRNONAA 56* 03/17/2012 1410   GFRAA 65* 03/17/2012 1410     Imaging: no new imaging  Scheduled Meds:   . albuterol  2.5 mg Nebulization Q6H  . antiseptic oral rinse  15 mL Mouth Rinse BID  . cloNIDine  0.1 mg Oral BID  . enoxaparin (LOVENOX) injection  40 mg Subcutaneous Q24H  . folic acid  1 mg Oral Daily  . lisinopril  20 mg Oral Daily   And  . hydrochlorothiazide  12.5 mg Oral Daily  . insulin aspart  0-15 Units Subcutaneous TID WC  . insulin aspart  0-5 Units Subcutaneous QHS  . ipratropium  0.5 mg Nebulization Q6H  . levofloxacin  500 mg Oral Daily  . levothyroxine  50 mcg Oral QAC breakfast  . multivitamin with minerals  1 tablet Oral Daily  . nicotine  14 mg Transdermal Daily  . pantoprazole  40 mg Oral Daily  . predniSONE  40 mg Oral Q breakfast  . thiamine  100 mg Oral Daily   Or  . thiamine  100 mg Intravenous Daily  . verapamil  240 mg Oral BID   Continuous Infusions:   . sodium chloride 20 mL/hr (03/18/12 1814)   PRN Meds:.acetaminophen, acetaminophen, hydrALAZINE, HYDROcodone-acetaminophen, LORazepam, LORazepam, menthol-cetylpyridinium, methocarbamol, morphine injection, ondansetron (ZOFRAN) IV, ondansetron, zolpidem   Physical Exam: Filed Vitals:   03/19/12 0524  BP: 150/70  Pulse: 66  Temp: 98.4 F (36.9 C)  Resp: 28    Intake/Output Summary (Last 24 hours) at 03/19/12 1610 Last data filed at 03/19/12 0524  Gross per 24  hour  Intake    681 ml  Output      0 ml  Net    681 ml   Gen'l - older white woman in no distress but short of breath with talking Cor - RRR Pulm - shallow inspirations, not using accessory muscles, no neck retractions, decreased breath sounds that are tight with end-expiratory wheezing. Neuro - A&O x 3.      Assessment/Plan: 1. pulm - continued SOB. Now with cough and increased wheezing.  Plan Continue levqauin  Increase prednisone to 30 mg bid for increase in wheezing  Pulse ox - will request recording of Room air O2 sats at rest, with talking and with ambulation  2. EtOH use - patient reports drinking 4 glasses of wine a day. Denies ever having any withdrawal problems  Plan Ativan 0.5 mg q 4 prn anxiety or withdrawal.   Dispo - home in AM, most likely with Home O2   Renee Donovan IM (o) [phone]; (c) [phone] Call-grp - Renee Donovan IM  Tele: [phone]  03/19/2012, 9:27 AM

## 2012-03-19 NOTE — Care Management Note (Addendum)
    Page 1 of 2   03/23/2012     10:20:41 AM   CARE MANAGEMENT NOTE 03/23/2012  Patient:  Renee Donovan, Renee Donovan   Account Number:  [id number]  Date Initiated:  03/19/2012  Documentation initiated by:  Colleen Can  Subjective/Objective Assessment:   dx COPD excerbation     Action/Plan:   Cm spoke with patient and son. Plans are for patient to return to her home in [place] where she has a caregiver. Already has RW and nebulizer at home   Anticipated DC Date:  03/22/2012   Anticipated DC Plan:  HOME W HOME HEALTH SERVICES      DC Planning Services  CM consult      PAC Choice  DURABLE MEDICAL EQUIPMENT  HOME HEALTH   Choice offered to / List presented to:  C-1 Patient   DME arranged  OXYGEN      DME agency  [facility]     HH arranged  HH-10 DISEASE MANAGEMENT  HH-2 PT  HH-1 RN  HH-7 RESPIRATORY THERAPY      HH agency  [facility]   Status of service:  Completed, signed off Medicare Important Message given?   (If response is "NO", the following Medicare IM given date fields will be blank) Date Medicare IM given:   Date Additional Medicare IM given:    Discharge Disposition:  HOME W HOME HEALTH SERVICES  Per UR Regulation:  Reviewed for med. necessity/level of care/duration of stay  If discussed at Long Length of Stay Meetings, dates discussed:    Comments:  03-22-12 Lorenda Ishihara RN CM 1200 Spoke with patient at bedside. States wants to use [facility] for [facility] services. Wants [facility] for O2 needs. Plan for d/c in am, awaiting final HH orders.  03/19/2012 Colleen Can BSN RN CCM [phone] Pt is requesting [facility] for DME-home oxygen. Unable to decide on preferred HHagency but she and son will review list at let CM know. CM will fu for [facility] and DME orders from attending doctor. O2 sats are listed in pt/ot notes.

## 2012-03-20 LAB — GLUCOSE, CAPILLARY

## 2012-03-20 MED ORDER — METHYLPREDNISOLONE SODIUM SUCC 125 MG IJ SOLR
80.0000 mg | Freq: Three times a day (TID) | INTRAMUSCULAR | Status: DC
  Administered 2012-03-20 – 2012-03-22 (×7): 80 mg via INTRAVENOUS
  Filled 2012-03-20 (×11): qty 1.28

## 2012-03-20 MED ORDER — ALBUTEROL SULFATE (5 MG/ML) 0.5% IN NEBU
2.5000 mg | INHALATION_SOLUTION | RESPIRATORY_TRACT | Status: DC | PRN
  Filled 2012-03-20: qty 0.5

## 2012-03-20 MED ORDER — FLUTICASONE PROPIONATE 50 MCG/ACT NA SUSP
2.0000 | Freq: Every day | NASAL | Status: DC
  Administered 2012-03-20 – 2012-03-27 (×8): 2 via NASAL
  Filled 2012-03-20: qty 16

## 2012-03-20 MED ORDER — SPIRITUS FRUMENTI: 1.0000 | Freq: Every day | ORAL | Status: DC

## 2012-03-20 MED ORDER — PIPERACILLIN-TAZOBACTAM 3.375 G IVPB
3.3750 g | Freq: Three times a day (TID) | INTRAVENOUS | Status: DC
  Administered 2012-03-20 – 2012-03-23 (×10): 3.375 g via INTRAVENOUS
  Filled 2012-03-20 (×11): qty 50

## 2012-03-20 MED ORDER — SALINE SPRAY 0.65 % NA SOLN
1.0000 | NASAL | Status: DC | PRN
  Administered 2012-03-20 (×2): 1 via NASAL
  Filled 2012-03-20: qty 44

## 2012-03-20 MED ORDER — AZITHROMYCIN 500 MG PO TABS
500.0000 mg | ORAL_TABLET | Freq: Every day | ORAL | Status: AC
  Administered 2012-03-20 – 2012-03-22 (×3): 500 mg via ORAL
  Filled 2012-03-20 (×3): qty 1

## 2012-03-20 NOTE — Consult Note (Signed)
Name: Renee Donovan MRN: [id number] DOB: September 05, 1930    LOS: 3  Referring Provider:  Vassie Loll  Reason for Referral:  COPD  PULMONARY / CRITICAL CARE MEDICINE  HPI:   78 y/o woman with PMH significant for Gold stage II COPD, HTN, and remote history of breast cancer who is seen in clinic by Dr. Delford Field for management of her COPD.  She states that about 9 months ago she began to get symptomatic from her COPD and has continued to slowly worsen since then.  The last few weeks she has had increasing cough and shortness of breath.  She complains that she has been waking up around 3am every morning to use her nebulizer.  She has also had post nasal drip which collects in the back of her throat and makes her gag.  This drainage is clear.  Her niece lives with her and is her care giver.  She has asthma but has also had an increase in cough and congestion over the last few weeks.  She is not oxygen dependent.  She does continue to smoke even with the e-cig  Current Status: up in chair, O2 around neck / off, dyspnea at rest, blowing nose with clear drainage  Vital Signs: Temp:  [97.5 F (36.4 C)-98.6 F (37 C)] 98.4 F (36.9 C) (12/24 0455) Pulse Rate:  [64-74] 74  (12/24 0455) Resp:  [18-20] 18  (12/24 0455) BP: (130-183)/(63-79) 164/63 mmHg (12/24 0642) SpO2:  [93 %-95 %] 93 % (12/24 0728)  Physical Examination: General:  Well appearing woman in no acute distress Neuro:  CNII-XII intact HEENT:  PERRL, EOMI Neck:  Supple, no masses Cardiovascular:  NRRR, no mrg Lungs:  Bibasilar crackles, no wheezes, faint sacttered rhonchi Abdomen:  +BS, soft, NTND, no HSM Musculoskeletal:  No joint abnormalities Skin:  No rash  Principal Problem:  *COPD exacerbation Active Problems:  HYPERLIPIDEMIA  CIGARETTE SMOKER  HYPERTENSION  EMPHYSEMA gold D  COPD  Hypothyroidism  SOB (shortness of breath)  Hypoxia   ASSESSMENT AND PLAN  PULMONARY  Lab 03/17/12 1454  PHART 7.458*  PCO2ART 41.4   PO2ART 58.6*  HCO3 28.9*  O2SAT 90.8   Ventilator Settings:   CXR:  No lobar infiltrate ETT:  none  A:    COPD flare  P:   Solu-medrol 80 Q8 with slow prednisone taper Hold home inhalers flonase 2 sprays in each nostril daily for post nasal drip Ct nebs Pt has had overnight oximetry study recently with no evidence of nocturnal desaturations.- note poltycythemia Oxygen to keep saturations > 90%.  Patient advised no smoking with oxygen, no smoking near tanks etc.  Will need ambulatory desaturation prior to d/c Abx per Primary.  Low clinical suspicion for PNA.     CARDIOVASCULAR  Lab 03/17/12 1410  TROPONINI --  LATICACIDVEN --  PROBNP 1403.0*   ECG:  NSR Lines: none  A:  Hypertension  P:  Consider echo to eval for diastolic dysfunction which may cause pulmonary edema and contribute to shortness of breath, especially at night BNP somewhat elevated - consider trial of gentle diuresis    Follow up appt arranged with Dr. Delford Field post discharge.   PCCM will sign off.  Please call for needs.  Canary Brim, NP-C [facility] Pgr: [phone] or [phone]  03/20/2012, 11:33 AM  Levy Pupa, MD, PhD 03/20/2012, 12:12 PM [facility] [phone] or if no answer [phone]

## 2012-03-20 NOTE — Progress Notes (Signed)
Talked to patient about DCP/ HHC choices, patient is agreeable to [facility] for home health care services. (pt stated "they just send me out too many bills"). Attending MD please order the Disease Management program for COPD -HHRN/PT/ nurses aide and home 02. Abelino Derrick RN,BSN,MHA

## 2012-03-20 NOTE — Progress Notes (Signed)
PT Cancellation Note  Patient Details Name: AIXA CORSELLO MRN: [id number] DOB: [birth year]   Cancelled Treatment:    Reason Eval/Treat Not Completed: Medical issues which prohibited therapy (pt continues with SOB, not feeling well.)   Donnetta Hail 03/20/2012, 9:31 AM

## 2012-03-20 NOTE — Progress Notes (Signed)
Occupational Therapy Treatment Patient Details Name: Renee Donovan MRN: [id number] DOB: 07-29-1930 Today's Date: 03/20/2012 Time: 4098-1191 OT Time Calculation (min): 23 min  OT Assessment / Plan / Recommendation Comments on Treatment Session Pt found incontinent of urine in the bed. Assisted with clean-up. Pt stated that she is continent but staff had told her to use "pee pads." Advised pt to call for help to use BSC when needing to void. Pt then stated that she is incontinent and does need to use pads. Spoke with RN regarding possibly putting pt on a toileting schedule.     Follow Up Recommendations  Home health OT    Barriers to Discharge       Equipment Recommendations  3 in 1 bedside comode    Recommendations for Other Services    Frequency Min 2X/week   Plan Discharge plan remains appropriate    Precautions / Restrictions Precautions Precautions: Fall Precaution Comments: monitor sats    Pertinent Vitals/Pain Pt denied pain.    ADL  Lower Body Bathing: Minimal assistance Where Assessed - Lower Body Bathing: Supported sit to stand Toilet Transfer: Minimal assistance Toilet Transfer Method: Surveyor, minerals: Other (comment) (to recliner.) Toileting - Clothing Manipulation and Hygiene: Minimal assistance Where Assessed - Toileting Clothing Manipulation and Hygiene: Sit to stand from 3-in-1 or toilet Transfers/Ambulation Related to ADLs: With SPT to chair, O2 remained stable in mid 90s on 2L of O2.    OT Diagnosis:    OT Problem List:   OT Treatment Interventions:     OT Goals ADL Goals Pt Will Perform Lower Body Bathing: with supervision;Sit to stand from chair;Sit to stand from bed ADL Goal: Lower Body Bathing - Progress: Updated due to goal met Pt Will Transfer to Toilet: with supervision;Ambulation;with DME ADL Goal: Toilet Transfer - Progress: Updated due to goal met ADL Goal: Toileting - Clothing Manipulation - Progress: Progressing  toward goals ADL Goal: Toileting - Hygiene - Progress: Progressing toward goals ADL Goal: Additional Goal #1 - Progress: Progressing toward goals  Visit Information  Last OT Received On: 03/20/12 Assistance Needed: +2 (for lines and O2.)    Subjective Data  Subjective: I just don't feel like I can do anything.   Prior Functioning       Cognition  Overall Cognitive Status: Appears within functional limits for tasks assessed/performed Arousal/Alertness: Awake/alert Orientation Level: Appears intact for tasks assessed Behavior During Session: [facility] for tasks performed    Mobility  Shoulder Instructions Bed Mobility Supine to Sit: 4: Min assist;HOB elevated;With rails Details for Bed Mobility Assistance: min cues for use of rails to self assist. Transfers Sit to Stand: 4: Min assist;With upper extremity assist;From bed Stand to Sit: 4: Min assist;With upper extremity assist;To chair/3-in-1 Details for Transfer Assistance: assist to rise and guide descent as pt trying to sit while turning to chair, verbal cues for hand placement       Exercises      Balance Static Standing Balance Static Standing - Balance Support: Left upper extremity supported;During functional activity Static Standing - Level of Assistance: 4: Min assist   End of Session OT - End of Session Activity Tolerance: Patient limited by fatigue Patient left: in chair;with call bell/phone within reach  GO     Porter Nakama A OTR/L [phone] 03/20/2012, 2:56 PM

## 2012-03-20 NOTE — Progress Notes (Signed)
Pt was sating 88% on 3L [place] where she had been sating at 94-95% the night before on 3L .  It took transitioning her to 9L 35% on venti mask to get her sats to hold at 90-91%.  Notified the MD Earl Gala on call.  Received no new orders.  Will continue to monitor pt. Daphene Calamity [place] 8:40 PM 03/20/2012

## 2012-03-20 NOTE — Progress Notes (Signed)
Subjective: Renee Donovan is doing worse: increased sOB, increased cough with sputum, weakness. She does remain hypoxemic  Objective: Lab: Lab Results  Component Value Date   WBC 11.2* 03/17/2012   HGB 17.2* 03/17/2012   HCT 50.2* 03/17/2012   MCV 100.6* 03/17/2012   PLT 206 03/17/2012   BMET    Component Value Date/Time   NA 140 03/17/2012 1410   K 3.6 03/17/2012 1410   CL 97 03/17/2012 1410   CO2 30 03/17/2012 1410   GLUCOSE 106* 03/17/2012 1410   BUN 11 03/17/2012 1410   CREATININE 0.94 03/17/2012 1410   CALCIUM 9.3 03/17/2012 1410   GFRNONAA 56* 03/17/2012 1410   GFRAA 65* 03/17/2012 1410   Room Air O2 Saturations:  Rest: 88%  Talking: 85%  Ambulation: 69%   Imaging:  Scheduled Meds:   . albuterol  2.5 mg Nebulization Q6H  . antiseptic oral rinse  15 mL Mouth Rinse BID  . enoxaparin (LOVENOX) injection  40 mg Subcutaneous Q24H  . folic acid  1 mg Oral Daily  . lisinopril  20 mg Oral Daily   And  . hydrochlorothiazide  12.5 mg Oral Daily  . insulin aspart  0-15 Units Subcutaneous TID WC  . insulin aspart  0-5 Units Subcutaneous QHS  . ipratropium  0.5 mg Nebulization Q6H  . levofloxacin  500 mg Oral Daily  . levothyroxine  50 mcg Oral QAC breakfast  . multivitamin with minerals  1 tablet Oral Daily  . nicotine  14 mg Transdermal Daily  . pantoprazole  40 mg Oral Daily  . predniSONE  30 mg Oral BID WC  . thiamine  100 mg Oral Daily   Or  . thiamine  100 mg Intravenous Daily  . verapamil  240 mg Oral BID   Continuous Infusions:   . sodium chloride 20 mL/hr (03/18/12 1814)   PRN Meds:.acetaminophen, acetaminophen, albuterol, cloNIDine, HYDROcodone-acetaminophen, LORazepam, LORazepam, menthol-cetylpyridinium, methocarbamol, morphine injection, ondansetron (ZOFRAN) IV, ondansetron, zolpidem   Physical Exam: Filed Vitals:   03/20/12 0642  BP: 164/63  Pulse:   Temp:   Resp:    Older woman in no distress Cor - RRR Pulm - mild increased WOB, poor  inspirations, diffuse wheezing, prolonged expiratory phase, wet cough Abd- soft Neuro - A&O, no shakes, hallucinations or other symptoms      Assessment/Plan: 1. PUlm - progressive AECOPD with wheezing and productive cough.  Plan D/C levquin, add azithromycin, zosyn  Change to solumedrol 80 mg q 8 ` Continue nebs q4 while awake     [employer] IM (o) [phone]; (c) [phone] Call-grp - Patsi Sears IM  Tele: [phone]  03/20/2012, 7:31 AM

## 2012-03-21 LAB — GLUCOSE, CAPILLARY
Glucose-Capillary: 234 mg/dL — ABNORMAL HIGH (ref 70–99)
Glucose-Capillary: 157 mg/dL — ABNORMAL HIGH (ref 70–99)
Glucose-Capillary: 134 mg/dL — ABNORMAL HIGH (ref 70–99)
Glucose-Capillary: 135 mg/dL — ABNORMAL HIGH (ref 70–99)
Glucose-Capillary: 316 mg/dL — ABNORMAL HIGH (ref 70–99)

## 2012-03-21 MED ORDER — CLONIDINE HCL 0.1 MG PO TABS
0.1000 mg | ORAL_TABLET | Freq: Once | ORAL | Status: AC
  Administered 2012-03-21: 0.1 mg via ORAL
  Filled 2012-03-21: qty 1

## 2012-03-21 MED ORDER — CLONIDINE HCL 0.2 MG PO TABS
0.2000 mg | ORAL_TABLET | Freq: Three times a day (TID) | ORAL | Status: DC
  Administered 2012-03-21 – 2012-03-27 (×17): 0.2 mg via ORAL
  Filled 2012-03-21 (×20): qty 1

## 2012-03-21 NOTE — Progress Notes (Signed)
Spoke with Dr Nehemiah Settle concerning pt's B/P continuing to be elevated, new orders given.

## 2012-03-21 NOTE — Progress Notes (Signed)
Subjective: Nurses note reviewed in regard to overnight desaturation and transient need for high flow oxygen. Reviewed Pulmonary NP note - 2 D echo ordered.   Feeling better today. Wants to go home. Still with cough and weak.    Objective: Lab: Lab Results  Component Value Date   WBC 11.2* 03/17/2012   HGB 17.2* 03/17/2012   HCT 50.2* 03/17/2012   MCV 100.6* 03/17/2012   PLT 206 03/17/2012   BMET    Component Value Date/Time   NA 140 03/17/2012 1410   K 3.6 03/17/2012 1410   CL 97 03/17/2012 1410   CO2 30 03/17/2012 1410   GLUCOSE 106* 03/17/2012 1410   BUN 11 03/17/2012 1410   CREATININE 0.94 03/17/2012 1410   CALCIUM 9.3 03/17/2012 1410   GFRNONAA 56* 03/17/2012 1410   GFRAA 65* 03/17/2012 1410     Imaging: No new imaging Scheduled Meds:   . albuterol  2.5 mg Nebulization Q6H  . antiseptic oral rinse  15 mL Mouth Rinse BID  . azithromycin  500 mg Oral Daily  . enoxaparin (LOVENOX) injection  40 mg Subcutaneous Q24H  . fluticasone  2 spray Each Nare Daily  . folic acid  1 mg Oral Daily  . lisinopril  20 mg Oral Daily   And  . hydrochlorothiazide  12.5 mg Oral Daily  . insulin aspart  0-15 Units Subcutaneous TID WC  . insulin aspart  0-5 Units Subcutaneous QHS  . ipratropium  0.5 mg Nebulization Q6H  . levothyroxine  50 mcg Oral QAC breakfast  . methylPREDNISolone (SOLU-MEDROL) injection  80 mg Intravenous Q8H  . multivitamin with minerals  1 tablet Oral Daily  . nicotine  14 mg Transdermal Daily  . pantoprazole  40 mg Oral Daily  . piperacillin-tazobactam (ZOSYN)  IV  3.375 g Intravenous Q8H  . spiritus frumenti  1 each Oral Daily  . thiamine  100 mg Oral Daily   Or  . thiamine  100 mg Intravenous Daily  . verapamil  240 mg Oral BID   Continuous Infusions:   . sodium chloride 20 mL/hr at 03/20/12 0947   PRN Meds:.acetaminophen, acetaminophen, albuterol, cloNIDine, HYDROcodone-acetaminophen, menthol-cetylpyridinium, methocarbamol, morphine injection,  ondansetron (ZOFRAN) IV, ondansetron, sodium chloride, zolpidem   Physical Exam: Filed Vitals:   03/21/12 0530  BP: 155/80  Pulse: 73  Temp: 97.8 F (36.6 C)  Resp: 20   Elderly white woman in no distress HEENT-C&S clear Cor - 2+ radial pulse, RRR Pulm - no increased WOB, markedly deminshed wheezing Abd - soft Neuro A&O x 3     Assessment/Plan: 1. PUlm AECOPD Day # 2 Zosyn and Azithromycin, On IV solumedrol 80 mg q 8  Plan Continue present antibiotic regimen  Decrease solumedrol in Am to 80 mg q 12 starting in AM  Continue nasal cannula Oxygen  2. HTN - stable   [name] [place] IM (o) [phone]; (c) [phone] Call-grp - Patsi Sears IM  Tele: [phone]  03/21/2012, 1:33 PM

## 2012-03-21 NOTE — Progress Notes (Signed)
Spoke with Dr Debby Bud about pt's B/P continuing to be elevated, new orders given.

## 2012-03-22 DIAGNOSIS — I059 Rheumatic mitral valve disease, unspecified: Secondary | ICD-10-CM

## 2012-03-22 LAB — GLUCOSE, CAPILLARY
Glucose-Capillary: 232 mg/dL — ABNORMAL HIGH (ref 70–99)
Glucose-Capillary: 230 mg/dL — ABNORMAL HIGH (ref 70–99)

## 2012-03-22 MED ORDER — METHYLPREDNISOLONE SODIUM SUCC 125 MG IJ SOLR
80.0000 mg | Freq: Two times a day (BID) | INTRAMUSCULAR | Status: DC
  Administered 2012-03-22 – 2012-03-23 (×2): 80 mg via INTRAVENOUS
  Filled 2012-03-22 (×4): qty 1.28

## 2012-03-22 MED ORDER — VERAPAMIL HCL ER 240 MG PO TBCR
240.0000 mg | EXTENDED_RELEASE_TABLET | Freq: Every day | ORAL | Status: DC
  Administered 2012-03-22 – 2012-03-27 (×5): 240 mg via ORAL
  Filled 2012-03-22 (×6): qty 1

## 2012-03-22 NOTE — Progress Notes (Signed)
Subjective: No new complaints. Has had BP excursions - she is asking why she is not getting verapamil. No respiratory complaints.  Objective: Lab: Lab Results  Component Value Date   WBC 11.2* 03/17/2012   HGB 17.2* 03/17/2012   HCT 50.2* 03/17/2012   MCV 100.6* 03/17/2012   PLT 206 03/17/2012   BMET    Component Value Date/Time   NA 140 03/17/2012 1410   K 3.6 03/17/2012 1410   CL 97 03/17/2012 1410   CO2 30 03/17/2012 1410   GLUCOSE 106* 03/17/2012 1410   BUN 11 03/17/2012 1410   CREATININE 0.94 03/17/2012 1410   CALCIUM 9.3 03/17/2012 1410   GFRNONAA 56* 03/17/2012 1410   GFRAA 65* 03/17/2012 1410     Imaging:  Scheduled Meds:   . albuterol  2.5 mg Nebulization Q6H  . antiseptic oral rinse  15 mL Mouth Rinse BID  . azithromycin  500 mg Oral Daily  . cloNIDine  0.2 mg Oral TID  . enoxaparin (LOVENOX) injection  40 mg Subcutaneous Q24H  . fluticasone  2 spray Each Nare Daily  . folic acid  1 mg Oral Daily  . lisinopril  20 mg Oral Daily   And  . hydrochlorothiazide  12.5 mg Oral Daily  . insulin aspart  0-15 Units Subcutaneous TID WC  . insulin aspart  0-5 Units Subcutaneous QHS  . ipratropium  0.5 mg Nebulization Q6H  . levothyroxine  50 mcg Oral QAC breakfast  . methylPREDNISolone (SOLU-MEDROL) injection  80 mg Intravenous Q8H  . multivitamin with minerals  1 tablet Oral Daily  . nicotine  14 mg Transdermal Daily  . pantoprazole  40 mg Oral Daily  . piperacillin-tazobactam (ZOSYN)  IV  3.375 g Intravenous Q8H  . spiritus frumenti  1 each Oral Daily  . thiamine  100 mg Oral Daily   Or  . thiamine  100 mg Intravenous Daily  . verapamil  240 mg Oral BID   Continuous Infusions:   . sodium chloride 20 mL/hr at 03/20/12 0947   PRN Meds:.acetaminophen, acetaminophen, albuterol, cloNIDine, HYDROcodone-acetaminophen, menthol-cetylpyridinium, methocarbamol, morphine injection, ondansetron (ZOFRAN) IV, ondansetron, sodium chloride, zolpidem   Physical  Exam: Filed Vitals:   03/22/12 0621  BP: 176/66  Pulse: 59  Temp: 97.2 F (36.2 C)  Resp: 16   Gen'l- getting a neb treatment Cor- RRR Pulm - no wheezing on exam.      Assessment/Plan: 1. Pulm - ACOPD Day #3 zosyn and last dose of azithromycin. On solumedrol 80 q 8.  Plan Continue present treatments  Decrease solumedrol  2. HTN - has had several excursions. Verapamil was held at admission - will restart along with scheduled clonidine   [name] [place] IM (o) [phone]; (c) [phone] Call-grp - Patsi Sears IM  Tele: [phone]  03/22/2012, 8:29 AM

## 2012-03-22 NOTE — Progress Notes (Signed)
  Echocardiogram 2D Echocardiogram has been performed.  Renee Donovan 03/22/2012, 1:29 PM

## 2012-03-22 NOTE — Progress Notes (Signed)
PT Cancellation Note  Patient Details Name: Renee Donovan MRN: [id number] DOB: [birth year]   Cancelled Treatment:    Reason Eval/Treat Not Completed: Medical issues which prohibited therapy (stated I can't do anything.)   Rada Hay 03/22/2012, 3:18 PM

## 2012-03-23 DIAGNOSIS — J438 Other emphysema: Secondary | ICD-10-CM

## 2012-03-23 DIAGNOSIS — I10 Essential (primary) hypertension: Secondary | ICD-10-CM

## 2012-03-23 DIAGNOSIS — F411 Generalized anxiety disorder: Secondary | ICD-10-CM

## 2012-03-23 DIAGNOSIS — R0902 Hypoxemia: Secondary | ICD-10-CM

## 2012-03-23 DIAGNOSIS — R0602 Shortness of breath: Secondary | ICD-10-CM

## 2012-03-23 LAB — GLUCOSE, CAPILLARY
Glucose-Capillary: 175 mg/dL — ABNORMAL HIGH (ref 70–99)
Glucose-Capillary: 165 mg/dL — ABNORMAL HIGH (ref 70–99)
Glucose-Capillary: 193 mg/dL — ABNORMAL HIGH (ref 70–99)

## 2012-03-23 MED ORDER — PREDNISONE 20 MG PO TABS
30.0000 mg | ORAL_TABLET | Freq: Two times a day (BID) | ORAL | Status: DC
  Administered 2012-03-23 – 2012-03-27 (×9): 30 mg via ORAL
  Filled 2012-03-23 (×12): qty 1

## 2012-03-23 MED ORDER — AMOXICILLIN-POT CLAVULANATE 875-125 MG PO TABS
1.0000 | ORAL_TABLET | Freq: Two times a day (BID) | ORAL | Status: DC
  Administered 2012-03-23 – 2012-03-27 (×9): 1 via ORAL
  Filled 2012-03-23 (×10): qty 1

## 2012-03-23 MED ORDER — FUROSEMIDE 20 MG PO TABS
20.0000 mg | ORAL_TABLET | Freq: Every day | ORAL | Status: DC
  Administered 2012-03-23 – 2012-03-25 (×3): 20 mg via ORAL
  Filled 2012-03-23 (×5): qty 1

## 2012-03-23 MED ORDER — PREDNISONE 20 MG PO TABS
30.0000 mg | ORAL_TABLET | Freq: Every day | ORAL | Status: DC
  Filled 2012-03-23: qty 1

## 2012-03-23 NOTE — Progress Notes (Signed)
Orders received from MD Norins for verapamil 240 once daily, order order discontinued Means, Myrtie Hawk RN 03-23-2012 13:56pm

## 2012-03-23 NOTE — Progress Notes (Signed)
OT Cancellation Note  Patient Details Name: Renee Donovan MRN: [id number] DOB: 13-Sep-1930   Cancelled Treatment:    Reason Eval/Treat Not Completed: Other (comment) (Pt's refusal to participate)  Cherrie Franca A OTR/L [phone] 03/23/2012, 2:56 PM

## 2012-03-23 NOTE — Progress Notes (Signed)
Paged MD Norins concerning duplicate order for verapamil unsure if the Md wants is once daily or twice, waiting callback Darlin Drop RN 03-23-2012 12:45

## 2012-03-23 NOTE — Progress Notes (Signed)
Subjective: No report of problems. Mrs. Alcivar does report she feels congested and somewhat SOB Objective: Lab: Lab Results  Component Value Date   WBC 11.2* 03/17/2012   HGB 17.2* 03/17/2012   HCT 50.2* 03/17/2012   MCV 100.6* 03/17/2012   PLT 206 03/17/2012   BMET    Component Value Date/Time   NA 140 03/17/2012 1410   K 3.6 03/17/2012 1410   CL 97 03/17/2012 1410   CO2 30 03/17/2012 1410   GLUCOSE 106* 03/17/2012 1410   BUN 11 03/17/2012 1410   CREATININE 0.94 03/17/2012 1410   CALCIUM 9.3 03/17/2012 1410   GFRNONAA 56* 03/17/2012 1410   GFRAA 65* 03/17/2012 1410     Imaging:  Scheduled Meds:   . albuterol  2.5 mg Nebulization Q6H  . antiseptic oral rinse  15 mL Mouth Rinse BID  . cloNIDine  0.2 mg Oral TID  . enoxaparin (LOVENOX) injection  40 mg Subcutaneous Q24H  . fluticasone  2 spray Each Nare Daily  . folic acid  1 mg Oral Daily  . lisinopril  20 mg Oral Daily   And  . hydrochlorothiazide  12.5 mg Oral Daily  . insulin aspart  0-15 Units Subcutaneous TID WC  . insulin aspart  0-5 Units Subcutaneous QHS  . ipratropium  0.5 mg Nebulization Q6H  . levothyroxine  50 mcg Oral QAC breakfast  . methylPREDNISolone (SOLU-MEDROL) injection  80 mg Intravenous Q12H  . multivitamin with minerals  1 tablet Oral Daily  . nicotine  14 mg Transdermal Daily  . pantoprazole  40 mg Oral Daily  . piperacillin-tazobactam (ZOSYN)  IV  3.375 g Intravenous Q8H  . spiritus frumenti  1 each Oral Daily  . thiamine  100 mg Oral Daily   Or  . thiamine  100 mg Intravenous Daily  . verapamil  240 mg Oral BID  . verapamil  240 mg Oral Daily   Continuous Infusions:   . sodium chloride 20 mL/hr at 03/20/12 0947   PRN Meds:.acetaminophen, acetaminophen, albuterol, cloNIDine, HYDROcodone-acetaminophen, menthol-cetylpyridinium, methocarbamol, morphine injection, ondansetron (ZOFRAN) IV, ondansetron, sodium chloride, zolpidem   Physical Exam: Filed Vitals:   03/23/12 0755  BP:  173/67  Pulse: 52  Temp:   Resp:    Gen'l - Elderly white woman in no distress HEENT - C&S clear Cor - RRR Pulm - decreased BS, no wheezing, no increased WOB Abd- soft. Neuro - Awake and alert, speech clear    Assessment/Plan: 1. Pulm - AECOPD Day #4 zosyn, 3/3 azithromycin. On solumedrol 80 mg IV q12.  Plan D/C zosyn, start augmentin  D/c solumedrol, start prednisone 30 mg bid  Continue nebs  Continue O2  2. HTN - continued elevation despite being back on Verapamil and clonidine.  Plan - will add furosemide 20 mg po qd  Dispo - home in AM if does well on oral steroids. Will need home O2    [name] [place] IM (o) [phone]; (c) [phone] Call-grp - Patsi Sears IM  Tele: [phone]  03/23/2012, 8:39 AM

## 2012-03-24 DIAGNOSIS — R197 Diarrhea, unspecified: Secondary | ICD-10-CM

## 2012-03-24 DIAGNOSIS — E876 Hypokalemia: Secondary | ICD-10-CM

## 2012-03-24 LAB — CBC WITH DIFFERENTIAL/PLATELET
Basophils Absolute: 0 10*3/uL (ref 0.0–0.1)
Basophils Relative: 0 % (ref 0–1)
HCT: 46.5 % — ABNORMAL HIGH (ref 36.0–46.0)
MCHC: 34.6 g/dL (ref 30.0–36.0)
Monocytes Absolute: 0.9 10*3/uL (ref 0.1–1.0)
Neutro Abs: 14.2 10*3/uL — ABNORMAL HIGH (ref 1.7–7.7)
Platelets: 202 10*3/uL (ref 150–400)
RDW: 13.8 % (ref 11.5–15.5)

## 2012-03-24 LAB — BASIC METABOLIC PANEL
Calcium: 8.3 mg/dL — ABNORMAL LOW (ref 8.4–10.5)
Chloride: 97 mEq/L (ref 96–112)
Creatinine, Ser: 1.06 mg/dL (ref 0.50–1.10)
GFR calc Af Amer: 56 mL/min — ABNORMAL LOW (ref >90)

## 2012-03-24 LAB — GLUCOSE, CAPILLARY: Glucose-Capillary: 178 mg/dL — ABNORMAL HIGH (ref 70–99)

## 2012-03-24 MED ORDER — POTASSIUM CHLORIDE CRYS ER 20 MEQ PO TBCR
40.0000 meq | EXTENDED_RELEASE_TABLET | ORAL | Status: AC
  Administered 2012-03-24 (×4): 40 meq via ORAL
  Filled 2012-03-24 (×4): qty 2

## 2012-03-24 MED ORDER — DIPHENOXYLATE-ATROPINE 2.5-0.025 MG PO TABS
1.0000 | ORAL_TABLET | Freq: Four times a day (QID) | ORAL | Status: DC | PRN
  Administered 2012-03-24 (×2): 1 via ORAL
  Filled 2012-03-24 (×2): qty 1

## 2012-03-24 NOTE — Plan of Care (Signed)
Problem: Phase I Progression Outcomes Goal: Hemodynamically stable Outcome: Progressing Started lasix on 12/27

## 2012-03-24 NOTE — Progress Notes (Addendum)
Patient had normal BM at 2030 last night (first since 1 week ago) and since has had 2 very loose large amount of dark brown stool. Patient with incontinence. Pericare prn. Will monitor.

## 2012-03-24 NOTE — Progress Notes (Addendum)
BP remains elevated even after taking verapamil and catapres am doses. Will order other am antihypertensives.

## 2012-03-24 NOTE — Progress Notes (Addendum)
CRITICAL VALUE ALERT  Critical value received:  K+ 2.5  Date of notification:  03/24/12  Time of notification:  0610  Critical value read back:yes  Nurse who received alert:  Greig Right  MD notified (1st page):  Dr Clent Ridges  Time of first page:  [phone]  MD notified (2nd page):  Time of second page:  Responding MD:  Dr Clent Ridges  Time MD responded:  [phone]  Spoke to her about patient's BP and loose stools--MD stated to wait on Dr Debby Bud to address.

## 2012-03-24 NOTE — Progress Notes (Signed)
Subjective: Report of diarrhea noted. Low potassium noted. Problem with elevated BP noted. Patient w/o complaints except for diarrhea. Denies abdominal pain or fever.  Objective: Lab: Lab Results  Component Value Date   WBC 15.5* 03/24/2012   HGB 16.1* 03/24/2012   HCT 46.5* 03/24/2012   MCV 97.3 03/24/2012   PLT 202 03/24/2012   BMET    Component Value Date/Time   NA 137 03/24/2012 0405   K 2.5* 03/24/2012 0405   CL 97 03/24/2012 0405   CO2 29 03/24/2012 0405   GLUCOSE 218* 03/24/2012 0405   BUN 39* 03/24/2012 0405   CREATININE 1.06 03/24/2012 0405   CALCIUM 8.3* 03/24/2012 0405   GFRNONAA 49* 03/24/2012 0405   GFRAA 56* 03/24/2012 0405     Imaging: no new imaging  Scheduled Meds:   . albuterol  2.5 mg Nebulization Q6H  . amoxicillin-clavulanate  1 tablet Oral Q12H  . antiseptic oral rinse  15 mL Mouth Rinse BID  . cloNIDine  0.2 mg Oral TID  . enoxaparin (LOVENOX) injection  40 mg Subcutaneous Q24H  . fluticasone  2 spray Each Nare Daily  . folic acid  1 mg Oral Daily  . furosemide  20 mg Oral Daily  . lisinopril  20 mg Oral Daily   And  . hydrochlorothiazide  12.5 mg Oral Daily  . insulin aspart  0-15 Units Subcutaneous TID WC  . insulin aspart  0-5 Units Subcutaneous QHS  . ipratropium  0.5 mg Nebulization Q6H  . levothyroxine  50 mcg Oral QAC breakfast  . multivitamin with minerals  1 tablet Oral Daily  . nicotine  14 mg Transdermal Daily  . pantoprazole  40 mg Oral Daily  . potassium chloride  40 mEq Oral Q4H  . predniSONE  30 mg Oral BID WC  . spiritus frumenti  1 each Oral Daily  . thiamine  100 mg Oral Daily   Or  . thiamine  100 mg Intravenous Daily  . verapamil  240 mg Oral Daily   Continuous Infusions:   . sodium chloride 250 mL (03/24/12 0552)   PRN Meds:.acetaminophen, acetaminophen, albuterol, cloNIDine, HYDROcodone-acetaminophen, menthol-cetylpyridinium, methocarbamol, morphine injection, ondansetron (ZOFRAN) IV, ondansetron, sodium  chloride, zolpidem   Physical Exam: Filed Vitals:   03/24/12 1000  BP:   Pulse: 57  Temp:   Resp:    Gen'l- WNWD white woman in no distress HEENT - C&S clear Cor- RRR Pulm - no increased WOB no wheezing Abd - protuberant, BS +, soft, no guarding or rebound     Assessment/Plan: 1. PUlm - AECOPD 4/4 zosyn, 3/3 azithromycin, #2 augmentin. On oral prednisone 30 mg bid. Plan  continue nebs  Continue augmentin and prednisone  2. HTN - difficult to control on Verapamil 240 and clonidine 0.2 tid. Just added furosemide Plan - continue present regimen with PRN clonidine 0.1 mg q 4 prn SBP >160  3. Low potassium - 2.5 Plan Oral replacement  Bmet in AM  4. Diarrhea - no fever or pain but she has been on antibiotics Plan Stool for c.diff PCR  Lomotil as needed   [employer] [facility] (o) [phone]; (c) [phone] Call-grp - Patsi Sears IM  Tele: [phone]  03/24/2012, 11:55 AM

## 2012-03-25 LAB — BASIC METABOLIC PANEL
BUN: 31 mg/dL — ABNORMAL HIGH (ref 6–23)
Chloride: 101 mEq/L (ref 96–112)
Glucose, Bld: 134 mg/dL — ABNORMAL HIGH (ref 70–99)
Potassium: 3.7 mEq/L (ref 3.5–5.1)

## 2012-03-25 LAB — GLUCOSE, CAPILLARY: Glucose-Capillary: 282 mg/dL — ABNORMAL HIGH (ref 70–99)

## 2012-03-25 MED ORDER — VITAMINS A & D EX OINT
TOPICAL_OINTMENT | CUTANEOUS | Status: AC
  Administered 2012-03-25: 5
  Filled 2012-03-25: qty 5

## 2012-03-25 NOTE — Progress Notes (Signed)
Subjective: Mrs. sylvan reports that she is feeling "rough," "would have to feel better to die!". Continues to feel short of breath. She has not had further diarrhea after lomotil.  Objective: Lab: Lab Results  Component Value Date   WBC 15.5* 03/24/2012   HGB 16.1* 03/24/2012   HCT 46.5* 03/24/2012   MCV 97.3 03/24/2012   PLT 202 03/24/2012   BMET    Component Value Date/Time   NA 139 03/25/2012 0414   K 3.7 03/25/2012 0414   CL 101 03/25/2012 0414   CO2 29 03/25/2012 0414   GLUCOSE 134* 03/25/2012 0414   BUN 31* 03/25/2012 0414   CREATININE 0.94 03/25/2012 0414   CALCIUM 8.3* 03/25/2012 0414   GFRNONAA 56* 03/25/2012 0414   GFRAA 65* 03/25/2012 0414     Imaging: no new imaging Scheduled Meds:   . albuterol  2.5 mg Nebulization Q6H  . amoxicillin-clavulanate  1 tablet Oral Q12H  . antiseptic oral rinse  15 mL Mouth Rinse BID  . cloNIDine  0.2 mg Oral TID  . enoxaparin (LOVENOX) injection  40 mg Subcutaneous Q24H  . fluticasone  2 spray Each Nare Daily  . folic acid  1 mg Oral Daily  . furosemide  20 mg Oral Daily  . lisinopril  20 mg Oral Daily   And  . hydrochlorothiazide  12.5 mg Oral Daily  . insulin aspart  0-15 Units Subcutaneous TID WC  . insulin aspart  0-5 Units Subcutaneous QHS  . ipratropium  0.5 mg Nebulization Q6H  . levothyroxine  50 mcg Oral QAC breakfast  . multivitamin with minerals  1 tablet Oral Daily  . nicotine  14 mg Transdermal Daily  . pantoprazole  40 mg Oral Daily  . predniSONE  30 mg Oral BID WC  . spiritus frumenti  1 each Oral Daily  . thiamine  100 mg Oral Daily   Or  . thiamine  100 mg Intravenous Daily  . verapamil  240 mg Oral Daily   Continuous Infusions:   . sodium chloride 10 mL/hr at 03/24/12 2000   PRN Meds:.acetaminophen, acetaminophen, albuterol, cloNIDine, diphenoxylate-atropine, HYDROcodone-acetaminophen, menthol-cetylpyridinium, methocarbamol, morphine injection, ondansetron (ZOFRAN) IV, ondansetron, sodium  chloride, zolpidem   Physical Exam: Filed Vitals:   03/25/12 0912  BP: 174/69  Pulse: 57  Temp:   Resp:    Gen'l- ill appearing white woman but in no acute distress HEENT- C&S clear Cor - RRR Pulm - no increased WOB, decreased BS, no wheezing Abd- BS+ Neuro = A&O x 3.       Assessment/Plan: 1. Pulm - AECOPD 4/4 zosyn, 3/3 azithromycin, #3 Augmentin. Prednisone 30 mg bid. WBC elevated - may be due to steroid in the absence of fever.  Plan Continue present treatment  PA Lat CXR  2. HTN- continuing to run elevated BP - question if steroid driven.  Plan  Continue present medications and prn clonidine  3. Low Potassium  Corrected  4. Diarrhea - C- Diff negative by PCR. Lomotil was effective  Dispo - to ill for d/c today, hopefully tomorrow. Will need Home O2,  03/20/12: O2 sat on RA at rest 88%, talking  85%, ambulation 69% Order for HH-RN/PT, Resp already done with F-F completed.    [name] [place] [facility] (o) [phone]; (c) [phone] Call-grp - Patsi Sears IM  Tele: [phone]  03/25/2012, 10:57 AM

## 2012-03-26 DIAGNOSIS — I10 Essential (primary) hypertension: Secondary | ICD-10-CM

## 2012-03-26 DIAGNOSIS — F17200 Nicotine dependence, unspecified, uncomplicated: Secondary | ICD-10-CM

## 2012-03-26 LAB — GLUCOSE, CAPILLARY
Glucose-Capillary: 171 mg/dL — ABNORMAL HIGH (ref 70–99)
Glucose-Capillary: 261 mg/dL — ABNORMAL HIGH (ref 70–99)
Glucose-Capillary: 149 mg/dL — ABNORMAL HIGH (ref 70–99)

## 2012-03-26 MED ORDER — HYDRALAZINE HCL 20 MG/ML IJ SOLN
20.0000 mg | Freq: Four times a day (QID) | INTRAMUSCULAR | Status: DC | PRN
  Administered 2012-03-26: 20 mg via INTRAVENOUS
  Filled 2012-03-26 (×2): qty 1

## 2012-03-26 MED ORDER — ENSURE COMPLETE PO LIQD: 237.0000 mL | Freq: Two times a day (BID) | ORAL | Status: DC

## 2012-03-26 MED ORDER — CLONIDINE HCL 0.1 MG PO TABS
0.1000 mg | ORAL_TABLET | Freq: Once | ORAL | Status: AC
  Administered 2012-03-26: 0.1 mg via ORAL
  Filled 2012-03-26: qty 1

## 2012-03-26 MED ORDER — FUROSEMIDE 40 MG PO TABS
40.0000 mg | ORAL_TABLET | Freq: Every day | ORAL | Status: DC
  Administered 2012-03-26 – 2012-03-27 (×2): 40 mg via ORAL
  Filled 2012-03-26 (×3): qty 1

## 2012-03-26 NOTE — Progress Notes (Signed)
03/26/12 0900  PT Visit Information  Last PT Received On 03/26/12  Reason Eval/Treat Not Completed Medical issues which prohibited therapy (BP 247/90)

## 2012-03-26 NOTE — Progress Notes (Signed)
VASCULAR LAB PRELIMINARY  PRELIMINARY  PRELIMINARY  PRELIMINARY  Renal artery duplex completed.    Preliminary report:  No evidence of renal artery stenosis.  Zorah Backes, RVT 03/26/2012, 11:38 AM

## 2012-03-26 NOTE — Progress Notes (Signed)
Patient evaluated for long-term disease management services with [facility] Care Management Program. Spoke with patient at bedside to discuss [facility] Care Management services. She deferred all discussion to her son who is not currently at bedside. Offered to leave packet for son to read about program at patient's bedside. However, patient asked that information not be left. She is not interested in [facility] Care Management services at current time. States her son will have to make decision.      Raiford Noble, MSN-Ed, RN,BSN [facility] Liaison [phone]

## 2012-03-26 NOTE — Progress Notes (Signed)
Subjective: Renee Donovan has had a good night and has no respiratory distress. However, this AM her SBP is 247 manually. She is asymptomatic.  Objective: Lab: Lab Results  Component Value Date   WBC 15.5* 03/24/2012   HGB 16.1* 03/24/2012   HCT 46.5* 03/24/2012   MCV 97.3 03/24/2012   PLT 202 03/24/2012   BMET    Component Value Date/Time   NA 139 03/25/2012 0414   K 3.7 03/25/2012 0414   CL 101 03/25/2012 0414   CO2 29 03/25/2012 0414   GLUCOSE 134* 03/25/2012 0414   BUN 31* 03/25/2012 0414   CREATININE 0.94 03/25/2012 0414   CALCIUM 8.3* 03/25/2012 0414   GFRNONAA 56* 03/25/2012 0414   GFRAA 65* 03/25/2012 0414     Imaging:  Scheduled Meds:   . albuterol  2.5 mg Nebulization Q6H  . amoxicillin-clavulanate  1 tablet Oral Q12H  . antiseptic oral rinse  15 mL Mouth Rinse BID  . cloNIDine  0.2 mg Oral TID  . enoxaparin (LOVENOX) injection  40 mg Subcutaneous Q24H  . fluticasone  2 spray Each Nare Daily  . folic acid  1 mg Oral Daily  . furosemide  20 mg Oral Daily  . lisinopril  20 mg Oral Daily   And  . hydrochlorothiazide  12.5 mg Oral Daily  . insulin aspart  0-15 Units Subcutaneous TID WC  . insulin aspart  0-5 Units Subcutaneous QHS  . ipratropium  0.5 mg Nebulization Q6H  . levothyroxine  50 mcg Oral QAC breakfast  . multivitamin with minerals  1 tablet Oral Daily  . nicotine  14 mg Transdermal Daily  . pantoprazole  40 mg Oral Daily  . predniSONE  30 mg Oral BID WC  . spiritus frumenti  1 each Oral Daily  . thiamine  100 mg Oral Daily   Or  . thiamine  100 mg Intravenous Daily  . verapamil  240 mg Oral Daily   Continuous Infusions:   . sodium chloride 10 mL/hr at 03/25/12 0700   PRN Meds:.acetaminophen, acetaminophen, albuterol, cloNIDine, diphenoxylate-atropine, HYDROcodone-acetaminophen, menthol-cetylpyridinium, methocarbamol, morphine injection, ondansetron (ZOFRAN) IV, ondansetron, sodium chloride, zolpidem   Physical Exam: Filed Vitals:   03/26/12 0701  BP: 192/87  Pulse:   Temp:   Resp:    Gen'l- WNWd white woman in no distress HEENT - PERRLA Cor- 2+ radial pulse, RRR PUlm - no increased WOB on oxygen, no wheezing Neuro - Awake and alert, non-focal exam    Assessment/Plan: 1. Pulm - AECOPD #4 Augment, on prednisone 30 mg bid. Plan  Continue present treatment  2. HTN - very poor control even with prn clonidine  Plan - Hydralazine 20 mg IV q 6 prn SBP >160  Increase furosemide to 40 mg  Renal Artery U/S - r/o RAS    [name] [place] [facility] (o) [phone]; (c) [phone] Call-grp - Patsi Sears IM  Tele: [phone]  03/26/2012, 8:31 AM

## 2012-03-26 NOTE — Progress Notes (Signed)
Nutrition Follow-up  Intervention:   - Ensure Complete BID - Encouraged increased meal intake - Will monitor   Skin: Non-pitting RLE and LLE edema  Diet Order:  Regular, intake 50-75% of meals  - Pt denies any nausea/vomiting, reported last emesis was 1 week ago. Pt denies any pain with eating. Pt states she is breathing better. Pt states her appetite has returned and that she has been eating 75% of meals.   Meds: Scheduled Meds:   . albuterol  2.5 mg Nebulization Q6H  . amoxicillin-clavulanate  1 tablet Oral Q12H  . antiseptic oral rinse  15 mL Mouth Rinse BID  . cloNIDine  0.2 mg Oral TID  . enoxaparin (LOVENOX) injection  40 mg Subcutaneous Q24H  . fluticasone  2 spray Each Nare Daily  . folic acid  1 mg Oral Daily  . furosemide  40 mg Oral Daily  . lisinopril  20 mg Oral Daily   And  . hydrochlorothiazide  12.5 mg Oral Daily  . insulin aspart  0-15 Units Subcutaneous TID WC  . insulin aspart  0-5 Units Subcutaneous QHS  . ipratropium  0.5 mg Nebulization Q6H  . levothyroxine  50 mcg Oral QAC breakfast  . multivitamin with minerals  1 tablet Oral Daily  . nicotine  14 mg Transdermal Daily  . pantoprazole  40 mg Oral Daily  . predniSONE  30 mg Oral BID WC  . spiritus frumenti  1 each Oral Daily  . thiamine  100 mg Oral Daily   Or  . thiamine  100 mg Intravenous Daily  . verapamil  240 mg Oral Daily   Continuous Infusions:   . sodium chloride 10 mL/hr at 03/25/12 0700   PRN Meds:.acetaminophen, acetaminophen, albuterol, cloNIDine, diphenoxylate-atropine, hydrALAZINE, HYDROcodone-acetaminophen, menthol-cetylpyridinium, methocarbamol, morphine injection, ondansetron (ZOFRAN) IV, ondansetron, sodium chloride, zolpidem   CMP     Component Value Date/Time   NA 139 03/25/2012 0414   K 3.7 03/25/2012 0414   CL 101 03/25/2012 0414   CO2 29 03/25/2012 0414   GLUCOSE 134* 03/25/2012 0414   BUN 31* 03/25/2012 0414   CREATININE 0.94 03/25/2012 0414   CALCIUM 8.3*  03/25/2012 0414   PROT 7.1 07/21/2011 1154   PROT 7.1 07/21/2011 1154   ALBUMIN 3.7 07/21/2011 1154   ALBUMIN 3.7 07/21/2011 1154   AST 25 07/21/2011 1154   AST 25 07/21/2011 1154   ALT 25 07/21/2011 1154   ALT 25 07/21/2011 1154   ALKPHOS 83 07/21/2011 1154   ALKPHOS 83 07/21/2011 1154   BILITOT 1.1 07/21/2011 1154   BILITOT 1.1 07/21/2011 1154   GFRNONAA 56* 03/25/2012 0414   GFRAA 65* 03/25/2012 0414    CBG (last 3)   Basename 03/25/12 1714 03/25/12 1210 03/25/12 0726  GLUCAP 166* 181* 104*     Intake/Output Summary (Last 24 hours) at 03/26/12 0936 Last data filed at 03/25/12 1700  Gross per 24 hour  Intake    310 ml  Output      0 ml  Net    310 ml   Last BM - 12/28  Weight Status:  No new weights  Re-estimated needs:   1400-1650 calories 60-70g protein  Nutrition Dx: Inadequate oral intake - improved   Goal:   1. Resolution of pain with eating - met 2. Pt to consume >90% of meals - not met  Monitor:  Weighs, labs, intake   Levon Hedger MS, RD, LDN [phone] Pager [phone] After Hours Pager

## 2012-03-26 NOTE — Progress Notes (Signed)
OT Cancellation Note  Patient Details Name: Renee Donovan MRN: [id number] DOB: 1930/11/24   Cancelled Treatment:    Reason Eval/Treat Not Completed: Medical issues which prohibited therapy (elevated BP)  Savayah Waltrip A OTR/L [phone] 03/26/2012, 3:14 PM

## 2012-03-27 ENCOUNTER — Telehealth: Payer: Self-pay | Admitting: Internal Medicine

## 2012-03-27 MED ORDER — SALINE SPRAY 0.65 % NA SOLN: 1.0000 | NASAL | Status: DC | PRN

## 2012-03-27 MED ORDER — NICOTINE 14 MG/24HR TD PT24: 1.0000 | MEDICATED_PATCH | Freq: Every day | TRANSDERMAL | Status: DC

## 2012-03-27 MED ORDER — HYDRALAZINE HCL 25 MG PO TABS: 25.0000 mg | ORAL_TABLET | Freq: Three times a day (TID) | ORAL | Status: DC

## 2012-03-27 MED ORDER — IPRATROPIUM BROMIDE 0.02 % IN SOLN
0.5000 mg | Freq: Three times a day (TID) | RESPIRATORY_TRACT | Status: DC
  Administered 2012-03-27: 0.5 mg via RESPIRATORY_TRACT
  Filled 2012-03-27: qty 2.5

## 2012-03-27 MED ORDER — AMOXICILLIN-POT CLAVULANATE 875-125 MG PO TABS: 1.0000 | ORAL_TABLET | Freq: Two times a day (BID) | ORAL | Status: DC

## 2012-03-27 MED ORDER — ALBUTEROL SULFATE (5 MG/ML) 0.5% IN NEBU
2.5000 mg | INHALATION_SOLUTION | Freq: Three times a day (TID) | RESPIRATORY_TRACT | Status: DC
  Administered 2012-03-27: 2.5 mg via RESPIRATORY_TRACT
  Filled 2012-03-27: qty 0.5

## 2012-03-27 MED ORDER — PREDNISONE 10 MG PO TABS: 40.0000 mg | ORAL_TABLET | Freq: Every day | ORAL | Status: DC

## 2012-03-27 NOTE — Progress Notes (Signed)
Ready for D/C home. See d/c summary and instructions. Office follow up within 7 days.  Dictated  [phone]

## 2012-03-27 NOTE — Discharge Summary (Signed)
NAMEMarland Kitchen  Renee, Renee Donovan NO.:  [id number]  MEDICAL RECORD NO.:  [id number]  LOCATION:  1313                         FACILITY:  [facility]  PHYSICIAN:  Renee Gess. Alexius Hangartner, MD  DATE OF BIRTH:  Dec 09, 1930  DATE OF ADMISSION:  03/17/2012 DATE OF DISCHARGE:  03/27/2012                              DISCHARGE SUMMARY   ADMITTING DIAGNOSES: 1. Acute exacerbation of chronic obstructive pulmonary disease. 2. Refractory hypertension.  DISCHARGE DIAGNOSES: 1. Acute exacerbation of chronic obstructive pulmonary disease. 2. Refractory hypertension.  CONSULTANTS:  None.  PROCEDURES: 1. Chest x-ray, March 17, 2012, which showed no acute abnormality     noted. 2. Renal ultrasound which showed no renal artery stenosis. 3. A 2D echo March 22, 2012, which showed normal systolic function     with an EF of 55-60%.  Wall motion was normal.  No wall motion     abnormalities were noted.  No diastolic dysfunction was noted.  HISTORY OF PRESENT ILLNESS:  Renee Donovan is a 78 year old woman with a history of  hyperlipidemia, hypertension, degenerative disk disease, COPD and tobacco abuse.  She came to [facility] Emergency Department secondary to shortness of breath that developed over the past month but had become particularly worse over the 2-3 days prior to admission.  The patient had been using her nebulizer more often and noticed on the day of admission she was having increasing difficulty catching her breath while talking.  The patient does have a chronic cough with clear sputum and denied having any fevers or chills, chest pain, orthopnea or PND. The patient does continue to smoke although she is trying to cut down. In the emergency department, she was relatively hypoxemic with a room air oxygen saturation of 87%, she was using accessory muscles for respiration, was tachypneic with a respiratory rate of 33-35 although she was able to speak full sentences.  White count was   mildly elevated.  ABG with a pH of 7.46, pCO2 of 41, pO2 of 58.6.    Please see the H and P for details of past medical history, family history, social history and examination.  HOSPITAL COURSE: 1. Advanced COPD.  The patient has significant progressive COPD.  At     this point, she has become oxygen dependent.  Early in her     admission, the patient did have room air oxygen saturations     checked:  At rest  88%, talking 85%, ambulating 69%.  At that     point, the patient did have orders placed for Home Health     respiratory and physical therapy and the patient does need to have     home oxygen.  She does prefer a HELiOS type liquid oxygen system     because of increased poor ability. 2. Acute exacerbation of COPD.  The patient did develop significant     evidence of infectious bronchitis with elevated leukocytosis.  She     was treated for 4 days with IV Zosyn.  She completed 3 days of p.o.     azithromycin and was switched to p.o. Augmentin completing 4 days     of 10 while in hospital.  The patient also was  started on IV Solu-     Medrol and was able to be converted to p.o. prednisone.  She will     need to complete a prednisone burst and taper at home over the next     two weeks.  With the patient's infectious bronchitis being     stabilized with her COPD being stabilized, with oxygen saturation     levels being in acceptable range on oxygen she is now ready for     discharge to home. 3. Refractory hypertension.  The patient was taking verapamil at the     time of admission.  She also was taking clonidine 0.2 mg t.i.d and lisinopril/hct/     During the hospitalization,  her blood pressure remained very     elevated.  She was put on furosemide 40 mg daily and still had poor     control.  She was then switched to hydralazine 20 mg IV q.6 as     needed which did result in good blood pressure control.  The     patient did have a renal artery ultrasound to rule out renal artery      stenosis which was negative.  With the patient's blood pressure     being more reasonably controlled with a low of 133/42 with a     discharge reading of 154/84 she is stable and ready for discharge     home on an increased blood pressure regimen.  DISCHARGE EXAMINATION:  VITAL SIGNS:  Temperature was 97.3, blood pressure 154/84, pulse 64, respirations were 18,  oxygen saturation 93% on 2 L of oxygen. GENERAL APPEARANCE:  This is a pleasant woman looking her stated age in no acute distress.  HEENT:  Exam was  unremarkable with conjunctivae and sclerae being clear.  Pupils equal, round and reactive. NECK:  Supple. CHEST:  Without deformity except for increased AP diameter. PULMONARY:  The patient has no increased work of breathing.  Lungs were clear with no rales or wheezes. CARDIOVASCULAR:  A 2+ radial pulse, quiet precordium with a regular rate and rhythm. ABDOMEN:  Soft. NEUROLOGIC:  The patient is awake, alert.  She is oriented to person, place, time and context.  FINAL LABORATORY DATA:  From March 25, 2012,  sodium was 139, potassium 3.7, chloride 101, CO2 of 29, BUN of 31, creatinine 0.94, glucose was 65.  Lipid panel from March 18, 2012, with a cholesterol of 196, triglycerides 78, HDL 79, LDL was 101.  Final CBC from March 24, 2012, with a [place] count of 16109, hemoglobin 16.1 g, platelet count 202,000, with a differential of 91% segs, 3% bands, 6% monos.  TSH was checked on December, 22, 2013,  was normal at 1.42.  DISCHARGE MEDICATIONS:  The patient will resume her home medications. She will be taking her clonidine 0.1 mg. t.i.d.  She will be taking verapamil 240 mg daily and lisinopril/hct.  She will add hydralazine 25 mg bid. The patient will continue on a steroid taper.  She will continue on Augmentin for an additional 4 days. She will be on home O2 at 2 L.  DISPOSITION:  The patient is discharged to home.  Home health, PT, RN, respiratory therapy has been  requested.  The patient will be seen in the office for followup within 7 days of discharge.  CONDITION ON DISCHARGE:  The patient's condition at time of discharge dictation is stable with ongoing chronic medical  problems as noted.     Renee Gess Oliviagrace Crisanti,  MD     MEN/MEDQ  D:  03/27/2012  T:  03/27/2012  Job:  [id number]

## 2012-03-27 NOTE — Progress Notes (Signed)
PT Cancellation Note  ___Treatment cancelled today due to medical issues with patient which prohibited therapy  ___ Treatment cancelled today due to patient receiving procedure or test   _X_ Treatment cancelled today due to patient's refusal to participate .Marland Kitchen...the patient stated "I am going home today"  ___ Treatment cancelled today due to  Felecia Shelling  PTA [facility]  Acute  Rehab Pager     [phone]

## 2012-03-27 NOTE — Progress Notes (Signed)
Patient lying in bed at rest on room air and oxygen saturations decreased to 87%. Will place patient back on oxygen at 2L and will continue to monitor.

## 2012-03-27 NOTE — Telephone Encounter (Signed)
Tried calling patient, left msg with family member to have pt call back as soon as possible to schedule.

## 2012-03-27 NOTE — Progress Notes (Signed)
Patient/patient's son received discharge instructions. All verbalized understanding without further questions. Prescriptions had been called in to [facility] and follow up appointments were discussed. Patient belongings packed. Patient to be taken downstairs via wheelchair to be discharged home.

## 2012-03-27 NOTE — Discharge Summary (Signed)
NAMEMarland Kitchen  Renee Donovan, Renee Donovan NO.:  [id number]  MEDICAL RECORD NO.:  [id number]  LOCATION:  1313                         FACILITY:  [facility]  PHYSICIAN:  Renee Gess. Sricharan Lacomb, MD  DATE OF BIRTH:  08/15/1930  DATE OF ADMISSION:  03/17/2012 DATE OF DISCHARGE:                              DISCHARGE SUMMARY   ADDENDUM:  After reviewing the patient's medication list at the time of discharge, the following corrections are made:  Furosemide is discontinued.  She will continue on lisinopril HCT.  Clonidine will be resumed at her home dose at 0.1 mg 2 times daily.  The patient will take hydralazine 25 mg b.i.d.  Other medications include Augmentin 875 b.i.d. for 4 days, prednisone 40 mg daily x3, 30 mg daily x3, 20 mg daily until seen in followup.  She will resume Flonase nasal spray.  The patient has had mildly elevated blood sugars secondary to steroids. She will have home health nursing to monitor blood sugar.  Instructions will be called based upon readings.     Renee Gess Sway Guttierrez, MD     MEN/MEDQ  D:  03/27/2012  T:  03/27/2012  Job:  [id number]

## 2012-03-27 NOTE — Telephone Encounter (Signed)
Message copied by Newell Coral on Tue Mar 27, 2012  1:24 PM ------      Message from: Jacques Navy      Created: Tue Mar 27, 2012  8:29 AM       1. Transition call follow-up: discharged with COPD O2 dependent. Home health and home oxygen ordered. BP was a problem and new medications started.      2. Needs ov follow up in 5-7 days

## 2012-03-29 ENCOUNTER — Inpatient Hospital Stay: Payer: Medicare Other | Admitting: Adult Health

## 2012-03-29 LAB — GLUCOSE, CAPILLARY
Glucose-Capillary: 170 mg/dL — ABNORMAL HIGH (ref 70–99)
Glucose-Capillary: 185 mg/dL — ABNORMAL HIGH (ref 70–99)

## 2012-03-30 ENCOUNTER — Telehealth: Payer: Self-pay | Admitting: General Practice

## 2012-03-30 ENCOUNTER — Other Ambulatory Visit: Payer: Self-pay | Admitting: *Deleted

## 2012-03-30 MED ORDER — CLONIDINE HCL 0.1 MG PO TABS: 0.1000 mg | ORAL_TABLET | Freq: Two times a day (BID) | ORAL | Status: DC

## 2012-03-30 NOTE — Telephone Encounter (Signed)
Unable to contact patient at this number

## 2012-04-09 ENCOUNTER — Other Ambulatory Visit: Payer: Self-pay | Admitting: Internal Medicine

## 2012-04-09 MED ORDER — VERAPAMIL HCL ER 240 MG PO TBCR: 240.0000 mg | EXTENDED_RELEASE_TABLET | Freq: Every day | ORAL | Status: DC

## 2012-04-09 NOTE — Telephone Encounter (Signed)
Patient is requesting a refill on her verapamil sent in to [facility] on [address] [phone]) says they have been faxing

## 2012-04-11 ENCOUNTER — Ambulatory Visit: Payer: Medicare Other | Admitting: Internal Medicine

## 2012-04-19 ENCOUNTER — Ambulatory Visit: Payer: Medicare Other | Admitting: Internal Medicine

## 2012-04-25 ENCOUNTER — Telehealth: Payer: Self-pay | Admitting: *Deleted

## 2012-04-25 NOTE — Telephone Encounter (Signed)
Pt's son would like call from MEN if possible regarding mother. She is starting to become paranoid and confused and he is wonder if this is from the Prednisone or possible high blood sugar or if this is the start of early onset Dementia/Alzheimer's. P's son has tried to get her to come in for OV, but pt cancels appointments.

## 2012-04-25 NOTE — Telephone Encounter (Signed)
Spoke with son Tammy Sours. She is not using oxygen (last RA O2 sat 88% at rest, 69% with ambulation), she is taking her medications, she is unwilling to come to the doctor and has not accept home health services. Have no data: current CBGs, oxygen levels, BP monitoring. She is drinking more than ever, smoking. She is having memory problems, paranoid ideation, gets very angry and "cutting."  Recommended: 1. OV 2. Resumption of Home health 3. Reduce prednisone to 10 mg daily. 4. Consider competency eval and placement.

## 2012-05-07 ENCOUNTER — Telehealth: Payer: Self-pay | Admitting: Internal Medicine

## 2012-05-07 DIAGNOSIS — I10 Essential (primary) hypertension: Secondary | ICD-10-CM

## 2012-05-07 DIAGNOSIS — J438 Other emphysema: Secondary | ICD-10-CM

## 2012-05-07 DIAGNOSIS — R0902 Hypoxemia: Secondary | ICD-10-CM

## 2012-05-07 NOTE — Telephone Encounter (Signed)
Patients son would like to speak with assistant about his mothers home healthcare. The nurse has stopped coming out and he is not sure if that is because his mother told them to stop coming but he would like to get that care started back for her

## 2012-05-07 NOTE — Telephone Encounter (Signed)
Please read note below and advise.  

## 2012-05-08 ENCOUNTER — Telehealth: Payer: Self-pay | Admitting: *Deleted

## 2012-05-08 NOTE — Telephone Encounter (Signed)
Called pt's son, Tammy Sours and told him that Dr Debby Bud said at his last conversation with him (see phone note) Mrs. Donovan had declined home health care. It is ok to try again. I told him that our patient care coordinators have been contacted to contact home health. He is pleased and feels that even if she does not think she needs it, they feel she does.

## 2012-05-08 NOTE — Telephone Encounter (Signed)
At my last conversation with him (see phone note) Renee Donovan had declined home health care. It is ok to try again. [facility] notified

## 2012-05-15 ENCOUNTER — Encounter: Payer: Self-pay | Admitting: Internal Medicine

## 2012-05-15 ENCOUNTER — Ambulatory Visit (INDEPENDENT_AMBULATORY_CARE_PROVIDER_SITE_OTHER): Payer: Medicare Other | Admitting: Internal Medicine

## 2012-05-15 VITALS — BP 120/72 | HR 86 | Temp 97.2°F | Resp 10 | Wt 162.0 lb

## 2012-05-15 DIAGNOSIS — J438 Other emphysema: Secondary | ICD-10-CM

## 2012-05-15 DIAGNOSIS — F17200 Nicotine dependence, unspecified, uncomplicated: Secondary | ICD-10-CM

## 2012-05-15 DIAGNOSIS — I10 Essential (primary) hypertension: Secondary | ICD-10-CM

## 2012-05-15 DIAGNOSIS — F1010 Alcohol abuse, uncomplicated: Secondary | ICD-10-CM

## 2012-05-15 MED ORDER — TIOTROPIUM BROMIDE MONOHYDRATE 18 MCG IN CAPS: 18.0000 ug | ORAL_CAPSULE | Freq: Every day | RESPIRATORY_TRACT | Status: DC

## 2012-05-15 MED ORDER — BUPROPION HCL ER (XL) 150 MG PO TB24: 150.0000 mg | ORAL_TABLET | Freq: Every day | ORAL | Status: DC

## 2012-05-15 NOTE — Progress Notes (Signed)
Subjective:    Patient ID: Renee Donovan, female    DOB: 1930/09/29, 79 y.o.   MRN: [id number]  HPI [name] presents 2 months late for hospital follow-up after having an acute exacerbation of COPD- notes reviewed. She reports she is very limited in activity due to DOE but she is not using oxygen 24/7. She continues to smoke and has no interest in cessation. She continues to drink  1 bottle of wine/day. She feels weak and bad.  Past Medical History  Diagnosis Date  . Breast cancer 1991    s/p R lumpectomy and radiation   . Hyperlipidemia   . Hypertension   . DDD (degenerative disc disease)   . DJD (degenerative joint disease)     ankles  . Cluster headache     remote  . COPD (chronic obstructive pulmonary disease)     Golds Stage II feV1 63% 11/2009   Past Surgical History  Procedure Laterality Date  . Nose surgery  1993  . Chin lift  [id number]  . Toe surgery  1965    5th toe- right foot  . Breast lumpectomy  1991    Right breast   Family History  Problem Relation Age of Onset  . Coronary artery disease Father   . Heart attack Father   . Diabetes Father   . Breast cancer Mother    History   Social History  . Marital Status: Divorced    Spouse Name: N/A    Number of Children: 4  . Years of Education: 12   Occupational History  . retired     Art therapist, Audiological scientist estate   Social History Main Topics  . Smoking status: Current Every Day Smoker -- 0.50 packs/day for 60 years    Types: Cigarettes  . Smokeless tobacco: Never Used     Comment: e cigarrette///02/08/12  . Alcohol Use: Yes     Comment: 4 glasses of wine daily  . Drug Use: No  . Sexually Active: Not on file   Other Topics Concern  . 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 Married 1956- 3 years, divorced 67- 26 years- divorced   1 son- 71, 3 adopted daughter 9, 56, 66: 2 grandchildren. Work- IT consultant, Therapist, sports, Research officer, political party,  retired. $$- tight. Lives alone- house owned/paid for. Son lives nearby   ACP-Living will- wants to full code. Did discuss probabilities of success and chance of permanent mechanical ventilation. Will discuss further at next visit. (April '13).  Current smoker 1 ppd x 60 years. 4 glasses of wine daily.    Current Outpatient Prescriptions on File Prior to Visit  Medication Sig Dispense Refill  . albuterol (PROVENTIL) (2.5 MG/3ML) 0.083% nebulizer solution Take 3 mLs (2.5 mg total) by nebulization every 6 (six) hours as needed for wheezing.  75 mL  6  . albuterol (VENTOLIN HFA) 108 (90 BASE) MCG/ACT inhaler 1-2 puffs every  4-6 hours as needed  18 g  1  . cloNIDine (CATAPRES) 0.1 MG tablet Take 1 tablet (0.1 mg total) by mouth 2 (two) times daily.  60 tablet  3  . Fluticasone Furoate-Vilanterol (BREO ELLIPTA) 100-25 MCG/INH AEPB Inhale 1 puff into the lungs daily.  14 each  0  . hydrALAZINE (APRESOLINE) 25 MG tablet Take 1 tablet (25 mg total) by mouth 3 (three) times daily.  90 tablet  11  . levothyroxine (SYNTHROID, LEVOTHROID) 50 MCG tablet Take 50 mcg by mouth  daily.        . lisinopril-hydrochlorothiazide (PRINZIDE,ZESTORETIC) 20-12.5 MG per tablet Take 1 tablet by mouth daily.      . predniSONE (DELTASONE) 10 MG tablet Take 4 tablets (40 mg total) by mouth daily. 40 mg daily x 3 days, 30 mg daily x 3 days, 20 mg daily until office follow up  50 tablet  1  . verapamil (CALAN-SR) 240 MG CR tablet Take 1 tablet (240 mg total) by mouth at bedtime.  60 tablet  0  . amoxicillin-clavulanate (AUGMENTIN) 875-125 MG per tablet Take 1 tablet by mouth every 12 (twelve) hours.  8 tablet  0  . nicotine (NICODERM CQ - DOSED IN MG/24 HOURS) 14 mg/24hr patch Place 1 patch onto the skin daily.  21 patch  1  . sodium chloride (OCEAN) 0.65 % SOLN nasal spray Place 1 spray into the nose as needed for congestion.       No current facility-administered medications on file prior to visit.      Review of  Systems Constitutional:  Negative for fever, chills, activity change and unexpected weight change.  HEENT:  Negative for hearing loss, ear pain, congestion, neck stiffness and postnasal drip. Negative for sore throat or swallowing problems. Negative for dental complaints.   Eyes: Negative for vision loss or change in visual acuity.  Respiratory: Strongly positive for chest tightness and wheezing. Positive for DOE.   Cardiovascular: Negative for chest pain or palpitations. No exercise tolerance Gastrointestinal: No change in bowel habit. No bloating or gas. No reflux or indigestion Genitourinary: Negative for urgency, frequency, flank pain and difficulty urinating.  Musculoskeletal: Positive for myalgias, back pain, arthralgias and gait problem.  Neurological: Negative for dizziness, tremors, weakness and headaches.  Hematological: Negative for adenopathy.  Psychiatric/Behavioral: Negative for behavioral problems and dysphoric mood.       Objective:   Physical Exam Filed Vitals:   05/15/12 1446  BP: 120/72  Pulse: 86  Temp: 97.2 F (36.2 C)  Resp: 10  O2 sat 90% room air at rest  Gen'l - overweight white woman in no acute distress Cor - RRR Pulm - no increased WOB at rest, Lungs CTAP Neuor - A&O x 3       Assessment & Plan:  Social setting - discussed the need for her to consider more support in her living situation: either in home help or congregate living at either an independent or AL level. Discussed the process of locating an appropriate facility and then the management of the finances.  Plan -  She and her grandson will start the process.

## 2012-05-15 NOTE — Patient Instructions (Addendum)
1. COPD - DO NOT SMOKE!  Oxygen 24/7 -   Continue your nebulizer treatments but if you need more than four (4) treatments a day you will need to be seen  Start Spiriva inhaler to help with lung function  We will watch to see how you do off steroids when you finish the current round of steroids  2. Blood pressure - looking good today.   Continue all your present medications  3. Depression  Many issues  Plan - we can add an antidepressant - Wellbutrin ER 150 mg once a day.  4. Living situation - you need to begin the process of planning for the future: most likely Assisted Living. You should be looking at facilities and at your resources to make this move.

## 2012-05-16 NOTE — Assessment & Plan Note (Signed)
She reports continued 4 oz per day usage. Her son had called and reported that she has been adding spirits to her daily intake.   Plan  Encouraged moderation - 2 oz per day.

## 2012-05-16 NOTE — Assessment & Plan Note (Signed)
Counseled about the dangers of smoking especially in a patient with o2 dependent COPD/Emphysema  She is precontemplative - not interested in smoking cessation despite acknowledging the risks

## 2012-05-16 NOTE — Assessment & Plan Note (Signed)
On 5 agents, with which she claims to be adherent. BP Readings from Last 3 Encounters:  05/15/12 120/72  03/27/12 154/84  02/08/12 122/68   Good control at today's visit  Plan  Continue present regimen

## 2012-05-16 NOTE — Assessment & Plan Note (Signed)
Progressive decline - now O2 dependent and very limited in activity - less than 100' for ambulation on flat surface. She has not been using oxygen 24/7. In hospital RA sats at rest 89%, with talking 85%, walking 69%.  She reports adherence to her regimen but has been using albuterol indiscriminantly. She is instructed in the use of all her inhalers and nebulizer.  Plan  Smoking cessation encouraged  Continue present medical regimen with appropriate prn use of albuterol MDI  Add spiriva  Taper off prednisone

## 2012-05-17 ENCOUNTER — Telehealth: Payer: Self-pay | Admitting: Internal Medicine

## 2012-05-17 NOTE — Telephone Encounter (Signed)
Corrie Dandy, nurse with [facility], called with concerns about the pt and wanted to let Dr Debby Bud to be aware. When she came to see the pt, the pt came to the door, she was wearing only a shirt and a diaper. Pt stated that she has had diarrhea frequently and that her bottom was sore and wanted the nurse to check it. Nurse stated that upon checking the pt's bottom, she had dried feces on her bottom; nurse put Desitin on her bottom and advised the pt to clean better. She noticed large bottles of wine in the kitchen and the pt is smoking, she takes her O2 off and smokes, but she feels that she may have done that  Because she was there.  Pt is having incontinent spells of urine and bowel movements. While there, pt had a bowel movement at the kitchen table. Pt states she is depressed. She states she is trying to quit smoking, but Corrie Dandy does not believe that to be the case.  Corrie Dandy is advising that the pt needs Social Services involvement at this point. She feels the pt really needs help and her quality of life is not good from what she saw while in her home.  Please advise accordingly.

## 2012-05-17 NOTE — Telephone Encounter (Signed)
Patient Information:  Caller Name: [name]  Phone: [phone]  Patient: Renee Donovan, Renee Donovan  Gender: Female  DOB: Jun 30, 1930  Age: 79 Years  PCP: [name] (Adults only)  Office Follow Up:  Does the office need to follow up with this patient?: No  Instructions For The Office: N/A  RN Note:  Reviewed information on diarrhea and homw. Advised I would forward message to the office regarding her diarrhea.  Symptoms  Reason For Call & Symptoms: Patient was seen in the office by Dr. Debby Bud on Tuesday 05/15/12.  She forgot to mention she had been having diarrhea.  Onset "I don't know".  She states she is having 3-4 daily. Described as liquid brown ? unsure . No blood noted.  No abdominal discomfort. She is eating- "anything people will bring me, mostly fast foods". Patient abrupt with triage.  16oz of wine consumed daily  Reviewed Health History In EMR: Yes  Reviewed Medications In EMR: Yes  Reviewed Allergies In EMR: Yes  Reviewed Surgeries / Procedures: No  Date of Onset of Symptoms: 05/14/2012  Treatments Tried: Anti diarrhea medication 1 hour ago- [facility] brand  Treatments Tried Worked: No  Guideline(s) Used:  Diarrhea  Disposition Per Guideline:   Callback by PCP Today  Reason For Disposition Reached:   Age > 70 years  Advice Given:  Reassurance:  In healthy adults, new-onset diarrhea is usually caused by a viral infection of the intestines, which you can treat at home. Diarrhea is the body's way of getting rid of the infection. Here are some tips on how to keep ahead of the fluid losses.  Here is some care advice that should help.  Fluids:  Drink more fluids, at least 8-10 glasses (8 oz or 240 ml) daily.  For example: sports drinks, diluted fruit juices, soft drinks.  Supplement this with saltine crackers or soups to make certain that you are getting sufficient fluid and salt to meet your body's needs.  Avoid caffeinated beverages (Reason: caffeine is mildly  dehydrating).  Nutrition:  Maintaining some food intake during episodes of diarrhea is important.  Ideal initial foods include boiled starches/cereals (e.g., potatoes, rice, noodles, wheat, oats) with a small amount of salt to taste.  Other acceptable foods include: bananas, yogurt, crackers, soup.  As your stools return to normal consistency, resume a normal diet.  Diarrhea Medication - Bismuth Subsalicylate (e.g., Kaopectate, PeptoBismol):  Helps reduce diarrhea, vomiting, and abdominal cramping.  Adult dosage: 2 tablets or 2 tablespoons (30 ml) by mouth every hour if diarrhea continues to a maximum of 8 doses in a 24-hour period.  Do not use for more than 2 days  This medication can make the stools look dark or even black (but not red or tarry).  Expected Course:  Viral diarrhea lasts 4-7 days. Always worse on days 1 and 2.  Call Back If:  Signs of dehydration occur (e.g., no urine for more than 12 hours, very dry mouth, lightheaded, etc.)  Diarrhea lasts over 7 days  You become worse.

## 2012-05-17 NOTE — Telephone Encounter (Signed)
Mary from [facility] is calling to speak with nurse about concerns she has about this patient, she can be reached at [phone]

## 2012-05-17 NOTE — Telephone Encounter (Signed)
Need to contact her son or whoever is her POA about her condition and capacity to care for herself - can discuss at an OV. Already had discussion about need for AL. No no family support will need to contact adult protective services.

## 2012-05-22 ENCOUNTER — Telehealth: Payer: Self-pay | Admitting: Internal Medicine

## 2012-05-22 NOTE — Telephone Encounter (Signed)
Corrie Dandy from [facility] is requesting a call back concerning this patient, she can be reached at [phone]

## 2012-05-23 ENCOUNTER — Telehealth: Payer: Self-pay | Admitting: *Deleted

## 2012-05-23 MED ORDER — DIPHENOXYLATE-ATROPINE 2.5-0.025 MG PO TABS: 1.0000 | ORAL_TABLET | Freq: Four times a day (QID) | ORAL | Status: DC | PRN

## 2012-05-23 NOTE — Telephone Encounter (Signed)
Lomotil is called to pharmacy. Last phone note: recommended contacting POA in regard to patient's ability to manage at home or if no POA involved will need adult protective services. Has there been any follow up?

## 2012-05-23 NOTE — Telephone Encounter (Signed)
Caller: Mary/Other; Phone: [phone]; Reason for Call: Mary/RN from [facility] reports since 05/17/12, patient is having episodes of diarrhea 3-4 times/day.  Patient is having incontinence also.  Seen again on 05/20/12 with these problems continuing.  Patient is not wearing O2 and is still smoking.  Patient has tried OTC for diarrhea but it has not helped.  Is coughing with clear sputum and patient does not feel well.  Is asking for something for diarrhea and cough.  Corrie Dandy, RN with [facility].  Drug store of choice is [facility] on [address].

## 2012-05-23 NOTE — Telephone Encounter (Signed)
Call a nurse: Mary/Other; Phone: [phone]; Reason for Call: Mary/RN from [facility] reports since 05/17/12, patient is having episodes of diarrhea 3-4 times/day. Patient is having incontinence also. Seen again on 05/20/12 with these problems continuing. Patient is not wearing O2 and is still smoking. Patient has tried OTC for diarrhea but it has not helped. Is coughing with clear sputum and patient does not feel well. Is asking for something for diarrhea and cough. Corrie Dandy, RN with [facility]. Drug store of choice is [facility] on [address].   Please advise.

## 2012-05-24 NOTE — Telephone Encounter (Signed)
THANK YOU

## 2012-05-24 NOTE — Telephone Encounter (Signed)
Spoke with Corrie Dandy, home health nurse and advised her per Dr Debby Bud the need to contact her son or whoever is her POA about her condition and capacity to care for herself. Also, can discuss at an OV. Already had discussion about need for AL. No no family support will need to contact adult protective services. Corrie Dandy, is going to call pt's son Tammy Sours to discuss this with him and to share what she has seen first hand. She will contact us back about that call.

## 2012-06-07 ENCOUNTER — Other Ambulatory Visit: Payer: Self-pay

## 2012-06-07 MED ORDER — VERAPAMIL HCL ER 240 MG PO TBCR: 240.0000 mg | EXTENDED_RELEASE_TABLET | Freq: Every day | ORAL | Status: DC

## 2012-06-23 ENCOUNTER — Other Ambulatory Visit: Payer: Self-pay | Admitting: Critical Care Medicine

## 2012-07-06 ENCOUNTER — Inpatient Hospital Stay (HOSPITAL_COMMUNITY)
Admission: EM | Admit: 2012-07-06 | Discharge: 2012-07-09 | DRG: 189 | Disposition: A | Discharge status: Home Health | Payer: Medicare Other | Attending: Internal Medicine | Admitting: Internal Medicine

## 2012-07-06 ENCOUNTER — Encounter (HOSPITAL_COMMUNITY): Payer: Self-pay | Admitting: Emergency Medicine

## 2012-07-06 DIAGNOSIS — J9600 Acute respiratory failure, unspecified whether with hypoxia or hypercapnia: Principal | ICD-10-CM | POA: Diagnosis present

## 2012-07-06 DIAGNOSIS — F411 Generalized anxiety disorder: Secondary | ICD-10-CM | POA: Diagnosis present

## 2012-07-06 DIAGNOSIS — R1310 Dysphagia, unspecified: Secondary | ICD-10-CM | POA: Diagnosis present

## 2012-07-06 DIAGNOSIS — J9601 Acute respiratory failure with hypoxia: Secondary | ICD-10-CM

## 2012-07-06 DIAGNOSIS — F419 Anxiety disorder, unspecified: Secondary | ICD-10-CM

## 2012-07-06 DIAGNOSIS — Z9981 Dependence on supplemental oxygen: Secondary | ICD-10-CM

## 2012-07-06 DIAGNOSIS — R0902 Hypoxemia: Secondary | ICD-10-CM | POA: Diagnosis present

## 2012-07-06 DIAGNOSIS — J189 Pneumonia, unspecified organism: Secondary | ICD-10-CM | POA: Diagnosis present

## 2012-07-06 DIAGNOSIS — D72829 Elevated white blood cell count, unspecified: Secondary | ICD-10-CM | POA: Diagnosis present

## 2012-07-06 DIAGNOSIS — E039 Hypothyroidism, unspecified: Secondary | ICD-10-CM | POA: Diagnosis present

## 2012-07-06 DIAGNOSIS — F4323 Adjustment disorder with mixed anxiety and depressed mood: Secondary | ICD-10-CM | POA: Diagnosis present

## 2012-07-06 DIAGNOSIS — F17200 Nicotine dependence, unspecified, uncomplicated: Secondary | ICD-10-CM

## 2012-07-06 DIAGNOSIS — J441 Chronic obstructive pulmonary disease with (acute) exacerbation: Secondary | ICD-10-CM | POA: Diagnosis present

## 2012-07-06 DIAGNOSIS — F1010 Alcohol abuse, uncomplicated: Secondary | ICD-10-CM

## 2012-07-06 DIAGNOSIS — I10 Essential (primary) hypertension: Secondary | ICD-10-CM | POA: Diagnosis present

## 2012-07-06 DIAGNOSIS — E538 Deficiency of other specified B group vitamins: Secondary | ICD-10-CM

## 2012-07-06 MED ORDER — ALBUTEROL SULFATE (5 MG/ML) 0.5% IN NEBU
5.0000 mg | INHALATION_SOLUTION | Freq: Once | RESPIRATORY_TRACT | Status: AC
  Administered 2012-07-07: 5 mg via RESPIRATORY_TRACT
  Filled 2012-07-06: qty 1

## 2012-07-06 MED ORDER — IPRATROPIUM BROMIDE 0.02 % IN SOLN
0.5000 mg | Freq: Once | RESPIRATORY_TRACT | Status: AC
  Administered 2012-07-07: 0.5 mg via RESPIRATORY_TRACT
  Filled 2012-07-06: qty 2.5

## 2012-07-06 MED ORDER — METHYLPREDNISOLONE SODIUM SUCC 125 MG IJ SOLR
125.0000 mg | Freq: Once | INTRAMUSCULAR | Status: AC
  Administered 2012-07-07: 125 mg via INTRAVENOUS
  Filled 2012-07-06: qty 2

## 2012-07-06 NOTE — ED Notes (Signed)
Per EMS, pt hxCOPD stated increased ShOB.  92% on EMS arrival, pt on neb treatment, 5mg  Albuterol, now 99%. Pt denies pain and other complaints at this time.

## 2012-07-06 NOTE — ED Notes (Signed)
[id number]<AV> Expected date:<BR> Expected time:<BR> Means of arrival:<BR> Comments:<BR> EMS/79 yo female with hx COPD-c/o SOB

## 2012-07-06 NOTE — ED Provider Notes (Signed)
History     CSN: [id number]  Arrival date & time 07/06/12  2333   First MD Initiated Contact with Patient 07/06/12 2349      Chief Complaint  Patient presents with  . Shortness of Breath   HPI  History provided by the patient and family. Patient is a 79 year old female with history of hypertension hyperlipidemia and COPD on home O2 who presents with acutely worsening shortness of breath and wheezing. Patient states she is always short of breath with some waxing and waning of symptoms. This evening symptoms became worse without any specific aggravating factor. Patient tried using some of her home medications without any relief and called family. Family came to her home and called EMS because of her significant shortness of breath and wheezing. Patient was given albuterol in route with O2 administration. This has helped some but she continues to feel short of breath. Denies any other aggravating or alleviating factors. No other associated symptoms. No fever, chills or sweats. No increased coughing. No productive cough or hemoptysis.     Past Medical History  Diagnosis Date  . Breast cancer 1991    s/p R lumpectomy and radiation   . Hyperlipidemia   . Hypertension   . DDD (degenerative disc disease)   . DJD (degenerative joint disease)     ankles  . Cluster headache     remote  . COPD (chronic obstructive pulmonary disease)     Golds Stage II feV1 63% 11/2009    Past Surgical History  Procedure Laterality Date  . Nose surgery  1993  . Chin lift  [id number]  . Toe surgery  1965    5th toe- right foot  . Breast lumpectomy  1991    Right breast    Family History  Problem Relation Age of Onset  . Coronary artery disease Father   . Heart attack Father   . Diabetes Father   . Breast cancer Mother     History  Substance Use Topics  . Smoking status: Current Every Day Smoker -- 0.50 packs/day for 60 years    Types: Cigarettes  . Smokeless tobacco: Never Used     Comment: e  cigarrette///02/08/12  . Alcohol Use: Yes     Comment: 4 glasses of wine daily    OB History   Grav Para Term Preterm Abortions TAB SAB Ect Mult Living                  Review of Systems  Constitutional: Negative for fever, chills and diaphoresis.  Respiratory: Positive for shortness of breath and wheezing. Negative for cough.   Cardiovascular: Negative for chest pain.  Gastrointestinal: Negative for nausea, vomiting and abdominal pain.  All other systems reviewed and are negative.    Allergies  Review of patient's allergies indicates no known allergies.  Home Medications   Current Outpatient Rx  Name  Route  Sig  Dispense  Refill  . albuterol (PROVENTIL) (2.5 MG/3ML) 0.083% nebulizer solution      INHALE CONTENTS OF 1 VIAL VIA NEBULIZER EVERY 6 HOURS AS NEEDED FOR WHEEZING   90 mL   6     Dx code 492.8; Pls file medicare part b   . albuterol (VENTOLIN HFA) 108 (90 BASE) MCG/ACT inhaler      1-2 puffs every  4-6 hours as needed   18 g   1   . amoxicillin-clavulanate (AUGMENTIN) 875-125 MG per tablet   Oral   Take 1 tablet by mouth  every 12 (twelve) hours.   8 tablet   0   . buPROPion (WELLBUTRIN XL) 150 MG 24 hr tablet   Oral   Take 1 tablet (150 mg total) by mouth daily.   30 tablet   5   . cloNIDine (CATAPRES) 0.1 MG tablet   Oral   Take 1 tablet (0.1 mg total) by mouth 2 (two) times daily.   60 tablet   3   . diphenoxylate-atropine (LOMOTIL) 2.5-0.025 MG per tablet   Oral   Take 1 tablet by mouth 4 (four) times daily as needed for diarrhea or loose stools.   30 tablet   0   . Fluticasone Furoate-Vilanterol (BREO ELLIPTA) 100-25 MCG/INH AEPB   Inhalation   Inhale 1 puff into the lungs daily.   14 each   0   . hydrALAZINE (APRESOLINE) 25 MG tablet   Oral   Take 1 tablet (25 mg total) by mouth 3 (three) times daily.   90 tablet   11   . levothyroxine (SYNTHROID, LEVOTHROID) 50 MCG tablet   Oral   Take 50 mcg by mouth daily.            Marland Kitchen lisinopril-hydrochlorothiazide (PRINZIDE,ZESTORETIC) 20-12.5 MG per tablet   Oral   Take 1 tablet by mouth daily.         . nicotine (NICODERM CQ - DOSED IN MG/24 HOURS) 14 mg/24hr patch   Transdermal   Place 1 patch onto the skin daily.   21 patch   1   . predniSONE (DELTASONE) 10 MG tablet   Oral   Take 4 tablets (40 mg total) by mouth daily. 40 mg daily x 3 days, 30 mg daily x 3 days, 20 mg daily until office follow up   50 tablet   1   . sodium chloride (OCEAN) 0.65 % SOLN nasal spray   Nasal   Place 1 spray into the nose as needed for congestion.         Marland Kitchen tiotropium (SPIRIVA HANDIHALER) 18 MCG inhalation capsule   Inhalation   Place 1 capsule (18 mcg total) into inhaler and inhale daily.   30 capsule   12   . verapamil (CALAN-SR) 240 MG CR tablet   Oral   Take 1 tablet (240 mg total) by mouth at bedtime.   60 tablet   0     PT NEEDS TO SCHEDULE PHYSICAL EXAM FOR FURTHER REF ...     BP 162/77  Pulse 97  Temp(Src) 98.7 F (37.1 C) (Oral)  SpO2 99%  Physical Exam  Nursing note and vitals reviewed. Constitutional: She is oriented to person, place, and time. She appears well-developed and well-nourished. No distress.  HENT:  Head: Normocephalic.   Mouth dry  Eyes: Conjunctivae are normal.  Cardiovascular: Normal rate and regular rhythm.   No murmur heard. Pulmonary/Chest: Effort normal. No respiratory distress. She has wheezes. She has no rales.  Abdominal: Soft. She exhibits no distension. There is no tenderness.  Musculoskeletal: Normal range of motion.  Neurological: She is alert and oriented to person, place, and time.  Skin: Skin is warm and dry. No rash noted.  Poor skin turgor  Psychiatric: She has a normal mood and affect. Her behavior is normal.    ED Course  Procedures   Results for orders placed during the hospital encounter of 07/06/12  CBC WITH DIFFERENTIAL      Result Value Range   WBC 11.3 (*) 4.0 - 10.5 K/uL  RBC 4.78  3.87  - 5.11 MIL/uL   Hemoglobin 16.3 (*) 12.0 - 15.0 g/dL   HCT 40.9 (*) 81.1 - 91.4 %   MCV 101.5 (*) 78.0 - 100.0 fL   MCH 34.1 (*) 26.0 - 34.0 pg   MCHC 33.6  30.0 - 36.0 g/dL   RDW 78.2  95.6 - 21.3 %   Platelets 282  150 - 400 K/uL   Neutrophils Relative 71  43 - 77 %   Neutro Abs 8.0 (*) 1.7 - 7.7 K/uL   Lymphocytes Relative 18  12 - 46 %   Lymphs Abs 2.0  0.7 - 4.0 K/uL   Monocytes Relative 10  3 - 12 %   Monocytes Absolute 1.1 (*) 0.1 - 1.0 K/uL   Eosinophils Relative 1  0 - 5 %   Eosinophils Absolute 0.1  0.0 - 0.7 K/uL   Basophils Relative 1  0 - 1 %   Basophils Absolute 0.1  0.0 - 0.1 K/uL  BASIC METABOLIC PANEL      Result Value Range   Sodium 138  135 - 145 mEq/L   Potassium 4.3  3.5 - 5.1 mEq/L   Chloride 96  96 - 112 mEq/L   CO2 29  19 - 32 mEq/L   Glucose, Bld 114 (*) 70 - 99 mg/dL   BUN 9  6 - 23 mg/dL   Creatinine, Ser 0.86  0.50 - 1.10 mg/dL   Calcium 9.0  8.4 - 57.8 mg/dL   GFR calc non Af Amer 52 (*) >90 mL/min   GFR calc Af Amer 61 (*) >90 mL/min      Dg Chest Portable 1 View  07/07/2012  *RADIOLOGY REPORT*  Clinical Data: 79 year old female new onset shortness of breath.  PORTABLE CHEST - 1 VIEW  Comparison: 03/17/2012 and earlier.  Findings: Portable semi upright AP view at 0021 hours. Postoperative changes to the chest wall.  Stable lung volumes. Stable cardiac size and mediastinal contours.  Visualized tracheal air column is within normal limits.  No pneumothorax, pulmonary edema, pleural effusion or acute pulmonary opacity identified.  IMPRESSION: Stable. No acute cardiopulmonary abnormality.   Original Report Authenticated By: Erskine Speed, M.D.      1. COPD exacerbation       MDM  11:45 PM patient seen and evaluated. Patient tachypneic with complaints of shortness of breath. At this time no severe respiratory distress.  Patient was also seen and evaluated with attending physician. Will plan on admission for COPD exacerbation.  Spoke to try  hospitalist. They will see patient and admit. Would like patient to telemetry bed under team 8.     Angus Seller, PA-C 07/07/12 0120

## 2012-07-07 ENCOUNTER — Emergency Department (HOSPITAL_COMMUNITY): Payer: Medicare Other

## 2012-07-07 ENCOUNTER — Encounter (HOSPITAL_COMMUNITY): Payer: Self-pay

## 2012-07-07 DIAGNOSIS — F1010 Alcohol abuse, uncomplicated: Secondary | ICD-10-CM

## 2012-07-07 DIAGNOSIS — E538 Deficiency of other specified B group vitamins: Secondary | ICD-10-CM

## 2012-07-07 DIAGNOSIS — J189 Pneumonia, unspecified organism: Secondary | ICD-10-CM | POA: Diagnosis present

## 2012-07-07 DIAGNOSIS — J441 Chronic obstructive pulmonary disease with (acute) exacerbation: Secondary | ICD-10-CM

## 2012-07-07 DIAGNOSIS — I10 Essential (primary) hypertension: Secondary | ICD-10-CM

## 2012-07-07 DIAGNOSIS — F411 Generalized anxiety disorder: Secondary | ICD-10-CM

## 2012-07-07 DIAGNOSIS — J9601 Acute respiratory failure with hypoxia: Secondary | ICD-10-CM | POA: Diagnosis present

## 2012-07-07 LAB — BASIC METABOLIC PANEL
BUN: 9 mg/dL (ref 6–23)
Chloride: 96 mEq/L (ref 96–112)
Creatinine, Ser: 0.99 mg/dL (ref 0.50–1.10)
Glucose, Bld: 114 mg/dL — ABNORMAL HIGH (ref 70–99)
Potassium: 4.3 mEq/L (ref 3.5–5.1)

## 2012-07-07 LAB — COMPREHENSIVE METABOLIC PANEL
ALT: 35 U/L (ref 0–35)
AST: 36 U/L (ref 0–37)
Albumin: 3 g/dL — ABNORMAL LOW (ref 3.5–5.2)
Alkaline Phosphatase: 74 U/L (ref 39–117)
Chloride: 97 mEq/L (ref 96–112)
Potassium: 3.7 mEq/L (ref 3.5–5.1)
Sodium: 139 mEq/L (ref 135–145)
Total Protein: 6.7 g/dL (ref 6.0–8.3)

## 2012-07-07 LAB — GLUCOSE, CAPILLARY: Glucose-Capillary: 196 mg/dL — ABNORMAL HIGH (ref 70–99)

## 2012-07-07 LAB — CBC WITH DIFFERENTIAL/PLATELET
HCT: 48.5 % — ABNORMAL HIGH (ref 36.0–46.0)
Hemoglobin: 16.3 g/dL — ABNORMAL HIGH (ref 12.0–15.0)
Lymphs Abs: 2 10*3/uL (ref 0.7–4.0)
MCH: 34.1 pg — ABNORMAL HIGH (ref 26.0–34.0)
Monocytes Absolute: 1.1 10*3/uL — ABNORMAL HIGH (ref 0.1–1.0)
Monocytes Relative: 10 % (ref 3–12)
Neutro Abs: 8 10*3/uL — ABNORMAL HIGH (ref 1.7–7.7)
Neutrophils Relative %: 71 % (ref 43–77)
RBC: 4.78 MIL/uL (ref 3.87–5.11)

## 2012-07-07 LAB — CBC
HCT: 47.3 % — ABNORMAL HIGH (ref 36.0–46.0)
MCH: 34.2 pg — ABNORMAL HIGH (ref 26.0–34.0)
MCHC: 33.2 g/dL (ref 30.0–36.0)
MCV: 103.1 fL — ABNORMAL HIGH (ref 78.0–100.0)
RDW: 12.9 % (ref 11.5–15.5)

## 2012-07-07 MED ORDER — LEVOFLOXACIN 750 MG PO TABS
750.0000 mg | ORAL_TABLET | ORAL | Status: DC
  Filled 2012-07-07: qty 1

## 2012-07-07 MED ORDER — LEVOFLOXACIN IN D5W 750 MG/150ML IV SOLN
750.0000 mg | INTRAVENOUS | Status: DC
  Administered 2012-07-07: 750 mg via INTRAVENOUS
  Filled 2012-07-07: qty 150

## 2012-07-07 MED ORDER — SALINE SPRAY 0.65 % NA SOLN
1.0000 | NASAL | Status: DC | PRN
  Filled 2012-07-07: qty 44

## 2012-07-07 MED ORDER — ALBUTEROL SULFATE (5 MG/ML) 0.5% IN NEBU
2.5000 mg | INHALATION_SOLUTION | Freq: Four times a day (QID) | RESPIRATORY_TRACT | Status: DC
  Administered 2012-07-07 – 2012-07-09 (×10): 2.5 mg via RESPIRATORY_TRACT
  Filled 2012-07-07 (×9): qty 0.5

## 2012-07-07 MED ORDER — METHYLPREDNISOLONE SODIUM SUCC 125 MG IJ SOLR
60.0000 mg | Freq: Two times a day (BID) | INTRAMUSCULAR | Status: DC
  Administered 2012-07-07 – 2012-07-08 (×3): 60 mg via INTRAVENOUS
  Filled 2012-07-07 (×4): qty 0.96

## 2012-07-07 MED ORDER — HYDROCODONE-ACETAMINOPHEN 5-325 MG PO TABS
1.0000 | ORAL_TABLET | ORAL | Status: DC | PRN
  Administered 2012-07-08: 2 via ORAL
  Filled 2012-07-07: qty 2

## 2012-07-07 MED ORDER — VERAPAMIL HCL ER 240 MG PO TBCR
240.0000 mg | EXTENDED_RELEASE_TABLET | Freq: Every day | ORAL | Status: DC
  Administered 2012-07-07 – 2012-07-09 (×3): 240 mg via ORAL
  Filled 2012-07-07 (×3): qty 1

## 2012-07-07 MED ORDER — GUAIFENESIN-DM 100-10 MG/5ML PO SYRP
5.0000 mL | ORAL_SOLUTION | ORAL | Status: DC | PRN
  Administered 2012-07-07 – 2012-07-08 (×3): 5 mL via ORAL
  Filled 2012-07-07 (×3): qty 10

## 2012-07-07 MED ORDER — HYDRALAZINE HCL 25 MG PO TABS
25.0000 mg | ORAL_TABLET | Freq: Three times a day (TID) | ORAL | Status: DC
  Administered 2012-07-07 – 2012-07-09 (×7): 25 mg via ORAL
  Filled 2012-07-07 (×9): qty 1

## 2012-07-07 MED ORDER — FLUTICASONE FUROATE-VILANTEROL 100-25 MCG/INH IN AEPB: 1.0000 | INHALATION_SPRAY | Freq: Every day | RESPIRATORY_TRACT | Status: DC

## 2012-07-07 MED ORDER — DIPHENOXYLATE-ATROPINE 2.5-0.025 MG PO TABS: 1.0000 | ORAL_TABLET | Freq: Four times a day (QID) | ORAL | Status: DC | PRN

## 2012-07-07 MED ORDER — LISINOPRIL-HYDROCHLOROTHIAZIDE 20-12.5 MG PO TABS: 1.0000 | ORAL_TABLET | Freq: Every day | ORAL | Status: DC

## 2012-07-07 MED ORDER — GUAIFENESIN ER 600 MG PO TB12
600.0000 mg | ORAL_TABLET | Freq: Two times a day (BID) | ORAL | Status: DC
  Administered 2012-07-07 – 2012-07-09 (×5): 600 mg via ORAL
  Filled 2012-07-07 (×8): qty 1

## 2012-07-07 MED ORDER — LORAZEPAM 0.5 MG PO TABS
0.5000 mg | ORAL_TABLET | Freq: Once | ORAL | Status: AC
  Administered 2012-07-07: 0.5 mg via ORAL
  Filled 2012-07-07: qty 1

## 2012-07-07 MED ORDER — IPRATROPIUM BROMIDE 0.02 % IN SOLN
0.5000 mg | Freq: Four times a day (QID) | RESPIRATORY_TRACT | Status: DC
  Administered 2012-07-07 – 2012-07-09 (×10): 0.5 mg via RESPIRATORY_TRACT
  Filled 2012-07-07 (×11): qty 2.5

## 2012-07-07 MED ORDER — CALCIUM CARBONATE ANTACID 500 MG PO CHEW
1.0000 | CHEWABLE_TABLET | Freq: Four times a day (QID) | ORAL | Status: DC | PRN
  Administered 2012-07-07 – 2012-07-08 (×2): 200 mg via ORAL
  Filled 2012-07-07 (×3): qty 1

## 2012-07-07 MED ORDER — ACETAMINOPHEN 325 MG PO TABS: 650.0000 mg | ORAL_TABLET | Freq: Four times a day (QID) | ORAL | Status: DC | PRN

## 2012-07-07 MED ORDER — LEVOTHYROXINE SODIUM 50 MCG PO TABS
50.0000 ug | ORAL_TABLET | Freq: Every day | ORAL | Status: DC
  Administered 2012-07-07 – 2012-07-09 (×3): 50 ug via ORAL
  Filled 2012-07-07 (×4): qty 1

## 2012-07-07 MED ORDER — IPRATROPIUM BROMIDE 0.02 % IN SOLN
1.0000 mg | Freq: Once | RESPIRATORY_TRACT | Status: AC
  Administered 2012-07-07: 1 mg via RESPIRATORY_TRACT
  Filled 2012-07-07: qty 2.5

## 2012-07-07 MED ORDER — HYDROCHLOROTHIAZIDE 12.5 MG PO CAPS
12.5000 mg | ORAL_CAPSULE | Freq: Every day | ORAL | Status: DC
  Administered 2012-07-07 – 2012-07-09 (×3): 12.5 mg via ORAL
  Filled 2012-07-07 (×3): qty 1

## 2012-07-07 MED ORDER — MORPHINE SULFATE 2 MG/ML IJ SOLN
1.0000 mg | INTRAMUSCULAR | Status: DC | PRN
  Administered 2012-07-08 – 2012-07-09 (×2): 1 mg via INTRAVENOUS
  Filled 2012-07-07 (×2): qty 1

## 2012-07-07 MED ORDER — CLONIDINE HCL 0.1 MG PO TABS
0.1000 mg | ORAL_TABLET | Freq: Two times a day (BID) | ORAL | Status: DC
  Administered 2012-07-07 – 2012-07-09 (×6): 0.1 mg via ORAL
  Filled 2012-07-07 (×8): qty 1

## 2012-07-07 MED ORDER — BUPROPION HCL ER (XL) 150 MG PO TB24
150.0000 mg | ORAL_TABLET | Freq: Every day | ORAL | Status: DC
  Administered 2012-07-07 – 2012-07-09 (×3): 150 mg via ORAL
  Filled 2012-07-07 (×3): qty 1

## 2012-07-07 MED ORDER — SODIUM CHLORIDE 0.9 % IV SOLN
INTRAVENOUS | Status: AC
  Administered 2012-07-07: 04:00:00 via INTRAVENOUS

## 2012-07-07 MED ORDER — LISINOPRIL 20 MG PO TABS
20.0000 mg | ORAL_TABLET | Freq: Every day | ORAL | Status: DC
  Administered 2012-07-07 – 2012-07-09 (×3): 20 mg via ORAL
  Filled 2012-07-07 (×3): qty 1

## 2012-07-07 MED ORDER — NICOTINE 14 MG/24HR TD PT24
14.0000 mg | MEDICATED_PATCH | Freq: Every day | TRANSDERMAL | Status: DC
Start: 2012-07-07 — End: 2012-07-09
  Administered 2012-07-08 – 2012-07-09 (×2): 14 mg via TRANSDERMAL
  Filled 2012-07-07 (×3): qty 1

## 2012-07-07 MED ORDER — ALBUTEROL SULFATE (5 MG/ML) 0.5% IN NEBU
2.5000 mg | INHALATION_SOLUTION | RESPIRATORY_TRACT | Status: DC | PRN
  Administered 2012-07-08: 2.5 mg via RESPIRATORY_TRACT
  Filled 2012-07-07 (×2): qty 0.5

## 2012-07-07 MED ORDER — IPRATROPIUM BROMIDE 0.02 % IN SOLN: 0.5000 mg | RESPIRATORY_TRACT | Status: DC | PRN

## 2012-07-07 MED ORDER — MENTHOL 3 MG MT LOZG
1.0000 | LOZENGE | OROMUCOSAL | Status: DC | PRN
  Filled 2012-07-07: qty 9

## 2012-07-07 MED ORDER — ACETAMINOPHEN 650 MG RE SUPP: 650.0000 mg | Freq: Four times a day (QID) | RECTAL | Status: DC | PRN

## 2012-07-07 MED ORDER — ENOXAPARIN SODIUM 40 MG/0.4ML ~~LOC~~ SOLN
40.0000 mg | SUBCUTANEOUS | Status: DC
  Administered 2012-07-07 – 2012-07-09 (×3): 40 mg via SUBCUTANEOUS
  Filled 2012-07-07 (×3): qty 0.4

## 2012-07-07 MED ORDER — ALBUTEROL (5 MG/ML) CONTINUOUS INHALATION SOLN
10.0000 mg/h | INHALATION_SOLUTION | Freq: Once | RESPIRATORY_TRACT | Status: AC
  Administered 2012-07-07: 10 mg/h via RESPIRATORY_TRACT
  Filled 2012-07-07: qty 20

## 2012-07-07 NOTE — Evaluation (Signed)
Physical Therapy Evaluation Patient Details Name: Renee Donovan MRN: [id number] DOB: 1930-06-12 Today's Date: 07/07/2012 Time: 4098-1191 PT Time Calculation (min): 29 min  PT Assessment / Plan / Recommendation Clinical Impression  pt adm with pna and COPD; she is a limited household amb at baseline; Pt will benefit form PT to maximize independence for return home with intermittent family support.    PT Assessment  Patient needs continued PT services    Follow Up Recommendations  Home health PT;Supervision - Intermittent    Does the patient have the potential to tolerate intense rehabilitation      Barriers to Discharge        Equipment Recommendations       Recommendations for Other Services     Frequency   3x/wk   Precautions / Restrictions Precautions Precautions: Fall Precaution Comments: O2   Pertinent Vitals/Pain O2 sats 93% on 3L   Pt requiring rests due to DOE Pt wanted O2 increased, educated pt on this, pursed lip breathing;       Mobility  Bed Mobility Bed Mobility: Supine to Sit Supine to Sit: 4: Min assist;HOB elevated Details for Bed Mobility Assistance: cues for self assist; increased time Transfers Transfers: Sit to Stand;Stand to Sit Sit to Stand: 4: Min assist;From bed Stand to Sit: 4: Min assist;To chair/3-in-1;Without upper extremity assist Details for Transfer Assistance: min for wt shift to stand,  and for balance once upright; pt pulls up on her walker at home Ambulation/Gait Ambulation/Gait Assistance: 4: Min assist Ambulation Distance (Feet): 30 Feet Assistive device: Rolling walker Ambulation/Gait Assistance Details: cues for safe technique with RW; 1 standing rest due to increased WOB; On O2 throughout Gait Pattern: Step-to pattern;Step-through pattern;Trunk flexed    Exercises     PT Diagnosis: Difficulty walking;Generalized weakness  PT Problem List: Decreased balance;Decreased activity tolerance;Cardiopulmonary status limiting  activity PT Treatment Interventions:     PT Goals Acute Rehab PT Goals PT Goal Formulation: With patient Time For Goal Achievement: 07/14/12 Potential to Achieve Goals: Fair Pt will go Supine/Side to Sit: with supervision PT Goal: Supine/Side to Sit - Progress: Goal set today Pt will go Sit to Supine/Side: with supervision PT Goal: Sit to Supine/Side - Progress: Goal set today Pt will go Sit to Stand: with modified independence PT Goal: Sit to Stand - Progress: Goal set today Pt will Ambulate: 16 - 50 feet;with modified independence PT Goal: Ambulate - Progress: Goal set today  Visit Information  Last PT Received On: 07/07/12 Assistance Needed: +1    Subjective Data  Subjective: where did you find that nice looking man? Patient Stated Goal: home   Prior Functioning  Home Living Lives With: Alone Available Help at Discharge: Family Type of Home: House Home Access: Stairs to enter [employer] of Steps: 1 big Home Layout: One level Home Adaptive Equipment: Dan Humphreys - four wheeled;Wheelchair - manual;Bedside commode/3-in-1 Additional Comments: short distance  amb  Prior Function Level of Independence: Needs assistance Needs Assistance: Bathing;Dressing Able to Take Stairs?: No (assists--family) Comments: dtr in law assists with bathing Communication Communication: No difficulties    Cognition  Cognition Overall Cognitive Status: Appears within functional limits for tasks assessed/performed Arousal/Alertness: Awake/alert Orientation Level: Appears intact for tasks assessed Behavior During Session: [facility] for tasks performed    Extremity/Trunk Assessment Right Lower Extremity Assessment RLE ROM/Strength/Tone: [facility] for tasks assessed Left Lower Extremity Assessment LLE ROM/Strength/Tone: [facility] for tasks assessed   Balance Static Sitting Balance Static Sitting - Balance Support: Bilateral upper extremity supported;Feet  supported Static Sitting - Level of Assistance:  5: Stand by assistance  End of Session PT - End of Session Activity Tolerance: Patient limited by fatigue Patient left: in chair;with nursing in room;with call bell/phone within reach  GP     [facility] 07/07/2012, 12:38 PM

## 2012-07-07 NOTE — Progress Notes (Signed)
Subjective: Renee Donovan is up, sitting in a chair, c/o not being able to breath but wanting to go home.   Grandson reports that she is having a great deal of difficulty swallowing - chokes on her food.  Objective: Lab:  Recent Labs  07/07/12 0015 07/07/12 0403  WBC 11.3* 11.7*  NEUTROABS 8.0*  --   HGB 16.3* 15.7*  HCT 48.5* 47.3*  MCV 101.5* 103.1*  PLT 282 276    Recent Labs  07/07/12 0015 07/07/12 0403  NA 138 139  K 4.3 3.7  CL 96 97  GLUCOSE 114* 163*  BUN 9 9  CREATININE 0.99 1.10  CALCIUM 9.0 8.9    Imaging: CXR: IMPRESSION:  Stable. No acute cardiopulmonary abnormality.  Scheduled Meds: . albuterol  2.5 mg Nebulization Q6H  . buPROPion  150 mg Oral Daily  . cloNIDine  0.1 mg Oral BID  . enoxaparin (LOVENOX) injection  40 mg Subcutaneous Q24H  . Fluticasone Furoate-Vilanterol  1 puff Inhalation Daily  . guaiFENesin  600 mg Oral BID  . hydrALAZINE  25 mg Oral TID  . lisinopril  20 mg Oral Daily   And  . hydrochlorothiazide  12.5 mg Oral Daily  . ipratropium  0.5 mg Nebulization Q6H  . levofloxacin (LEVAQUIN) IV  750 mg Intravenous Q48H  . levothyroxine  50 mcg Oral QAC breakfast  . methylPREDNISolone (SOLU-MEDROL) injection  60 mg Intravenous Q12H  . nicotine  14 mg Transdermal Daily  . verapamil  240 mg Oral Daily   Continuous Infusions: . sodium chloride 75 mL/hr at 07/07/12 0402   PRN Meds:.acetaminophen, acetaminophen, albuterol, diphenoxylate-atropine, guaiFENesin-dextromethorphan, HYDROcodone-acetaminophen, ipratropium, menthol-cetylpyridinium, morphine injection, sodium chloride   Physical Exam: Filed Vitals:   07/07/12 0424  BP: 152/53  Pulse: 105  Temp: 98 F (36.7 C)  Resp: 20  oxygen saturation 94% on RA  Gen'l - Elderly chronically ill appearing white woman  HEENT- C&S clear Cor- 2+ radial, RRR Pulm - no increased WOB but she is doing pursed lip breathing. Feint end-expiratory wheezing Abd- soft Neuro - A&O x  3    Assessment/Plan: 1. Pulm - AECOPD, good oxygenation on 3 l Plan Convert levaquin to PO  Con't IV solumedrol  Continue respiratory meds.  2. HTN- stable  3. Psych - will continue present meds.  4. Dysphagia - per family report. Plan Change diet to dysphagia II  SLP swallow eval.    [name] [place] [facility] (o) [phone]; (c) [phone] Call-grp - [facility]  Tele: [phone]  07/07/2012, 12:54 PM

## 2012-07-07 NOTE — H&P (Signed)
[facility] History and Physical  Renee Donovan OZH:[id number] DOB: 1930-09-27 DOA: 07/06/2012  Referring physician: ER physician PCP: [name], MD   Chief Complaint: shortness of brewath  HPI:  79 year old female with past medical history of COPD on home O2, HTN, anxiety, hypothyroidism who presented to [facility] ED with worsening shortness of breath started 1 day prior to this admission. Patient reported having chronic cough, she does have a non productive cough. She has tried nebulizer treatments at home but with no symptomatic relief. No reports of chest pain, no palpitations, on fever or chills. No abdominal pain, no nausea or vomiting. No reports of diarrhea or constipation. No lightheadedness or dizziness or loss of consciousness.  Assessment and Plan:  Principal Problem:   Acute respiratory failure with hypoxia - likely due to acute COPD exacerbation, pneumonia - continue albuterol and Atrovent nebulizer treatments Q 6 hours scheduled and Q 2 hours PRN - continue solumedrol 60 mg Q 12 hours IV - continue Levaquin for possible pneumonia - continue oxygen support via nasal canula to keep O2 saturation above 90% Active Problems:   COPD with acute exacerbation - management as above with nebulizer treatments, steroids and Levaquin  Community acquired pneumonia  - started Levaquin for possible pneumonia - pneumonia order set in place; follow up blood culture results, follow up resp culture results - follow up legionella and strep pneumoniae test results.  Leukocytosis - mild, due to steroids and/or pneumonia    HYPERTENSION - continue prinizide, hydralazine   Hypothyroidism - continue levothyroxine   Anxiety - continue Wellbutrin   Renee Donovan  [facility] [phone]   Review of Systems:  Constitutional: Negative for fever, chills and malaise/fatigue. Negative for diaphoresis.  HENT: Negative for hearing loss, ear pain, nosebleeds, congestion, sore throat, neck pain,  tinnitus and ear discharge.   Eyes: Negative for blurred vision, double vision, photophobia, pain, discharge and redness.  Respiratory: per HPI.   Cardiovascular: Negative for chest pain, palpitations, orthopnea, claudication and leg swelling.  Gastrointestinal: Negative for nausea, vomiting and abdominal pain. Negative for heartburn, constipation, blood in stool and melena.  Genitourinary: Negative for dysuria, urgency, frequency, hematuria and flank pain.  Musculoskeletal: Negative for myalgias, back pain, joint pain and falls.  Skin: Negative for itching and rash.  Neurological: Negative for dizziness and weakness. Negative for tingling, tremors, sensory change, speech change, focal weakness, loss of consciousness and headaches.  Endo/Heme/Allergies: Negative for environmental allergies and polydipsia. Does not bruise/bleed easily.  Psychiatric/Behavioral: Negative for suicidal ideas. The patient is not nervous/anxious.      Past Medical History  Diagnosis Date  . Breast cancer 1991    s/p R lumpectomy and radiation   . Hyperlipidemia   . Hypertension   . DDD (degenerative disc disease)   . DJD (degenerative joint disease)     ankles  . Cluster headache     remote  . COPD (chronic obstructive pulmonary disease)     Golds Stage II feV1 63% 11/2009   Past Surgical History  Procedure Laterality Date  . Nose surgery  1993  . Chin lift  [id number]  . Toe surgery  1965    5th toe- right foot  . Breast lumpectomy  1991    Right breast   Social History:  reports that she has been smoking Cigarettes.  She has a 30 pack-year smoking history. She has never used smokeless tobacco. She reports that  drinks alcohol. She reports that she does not use illicit drugs.  No Known Allergies  Family History: Family medical history significant for HTN, HLD   Prior to Admission medications   Medication Sig Start Date End Date Taking? Authorizing Provider  albuterol (PROVENTIL) (2.5 MG/3ML) 0.083%  nebulizer solution INHALE CONTENTS OF 1 VIAL VIA NEBULIZER EVERY 6 HOURS AS NEEDED FOR WHEEZING 06/23/12   Storm Frisk, MD  albuterol (VENTOLIN HFA) 108 (90 BASE) MCG/ACT inhaler 1-2 puffs every  4-6 hours as needed 07/07/11   Storm Frisk, MD  amoxicillin-clavulanate (AUGMENTIN) 875-125 MG per tablet Take 1 tablet by mouth every 12 (twelve) hours. 03/27/12   Jacques Navy, MD  buPROPion (WELLBUTRIN XL) 150 MG 24 hr tablet Take 1 tablet (150 mg total) by mouth daily. 05/15/12   Jacques Navy, MD  cloNIDine (CATAPRES) 0.1 MG tablet Take 1 tablet (0.1 mg total) by mouth 2 (two) times daily. 03/30/12   Jacques Navy, MD  diphenoxylate-atropine (LOMOTIL) 2.5-0.025 MG per tablet Take 1 tablet by mouth 4 (four) times daily as needed for diarrhea or loose stools. 05/23/12   Jacques Navy, MD  Fluticasone Furoate-Vilanterol (BREO ELLIPTA) 100-25 MCG/INH AEPB Inhale 1 puff into the lungs daily. 03/09/12   Storm Frisk, MD  hydrALAZINE (APRESOLINE) 25 MG tablet Take 1 tablet (25 mg total) by mouth 3 (three) times daily. 03/27/12   Jacques Navy, MD  levothyroxine (SYNTHROID, LEVOTHROID) 50 MCG tablet Take 50 mcg by mouth daily.      Historical Provider, MD  lisinopril-hydrochlorothiazide (PRINZIDE,ZESTORETIC) 20-12.5 MG per tablet Take 1 tablet by mouth daily.    Historical Provider, MD  nicotine (NICODERM CQ - DOSED IN MG/24 HOURS) 14 mg/24hr patch Place 1 patch onto the skin daily. 03/27/12   Jacques Navy, MD  predniSONE (DELTASONE) 10 MG tablet Take 4 tablets (40 mg total) by mouth daily. 40 mg daily x 3 days, 30 mg daily x 3 days, 20 mg daily until office follow up 03/27/12   Jacques Navy, MD  sodium chloride (OCEAN) 0.65 % SOLN nasal spray Place 1 spray into the nose as needed for congestion. 03/27/12   Jacques Navy, MD  tiotropium (SPIRIVA HANDIHALER) 18 MCG inhalation capsule Place 1 capsule (18 mcg total) into inhaler and inhale daily. 05/15/12   Jacques Navy, MD   verapamil (CALAN-SR) 240 MG CR tablet Take 1 tablet (240 mg total) by mouth at bedtime. 06/07/12   Jacques Navy, MD   Physical Exam: Filed Vitals:   07/06/12 2343 07/06/12 2357 07/07/12 0109  BP: 162/77    Pulse: 97    Temp: 98.7 F (37.1 C)    TempSrc: Oral    SpO2: 99% 92% 92%    Physical Exam  Constitutional: Appears well-developed and well-nourished. No distress.  HENT: Normocephalic. External right and left ear normal. Oropharynx is clear and moist.  Eyes: Conjunctivae and EOM are normal. PERRLA, no scleral icterus.  Neck: Normal ROM. Neck supple. No JVD. No tracheal deviation. No thyromegaly.  CVS: RRR, S1/S2 +, no murmurs, no gallops, no carotid bruit.  Pulmonary: wheezing appreciated, some rhonchi in mid lung lobes.  Abdominal: Soft. BS +,  no distension, tenderness, rebound or guarding.  Musculoskeletal: Normal range of motion. No edema and no tenderness.  Lymphadenopathy: No lymphadenopathy noted, cervical, inguinal. Neuro: Alert. Normal reflexes, muscle tone coordination. No cranial nerve deficit. Skin: Skin is warm and dry. No rash noted. Not diaphoretic. No erythema. No pallor.   Labs on Admission:  Basic Metabolic Panel:  Recent  Labs Lab 07/07/12 0015  NA 138  K 4.3  CL 96  CO2 29  GLUCOSE 114*  BUN 9  CREATININE 0.99  CALCIUM 9.0   Liver Function Tests: No results found for this basename: AST, ALT, ALKPHOS, BILITOT, PROT, ALBUMIN,  in the last 168 hours No results found for this basename: LIPASE, AMYLASE,  in the last 168 hours No results found for this basename: AMMONIA,  in the last 168 hours CBC:  Recent Labs Lab 07/07/12 0015  WBC 11.3*  NEUTROABS 8.0*  HGB 16.3*  HCT 48.5*  MCV 101.5*  PLT 282   Cardiac Enzymes: No results found for this basename: CKTOTAL, CKMB, CKMBINDEX, TROPONINI,  in the last 168 hours BNP: No components found with this basename: POCBNP,  CBG: No results found for this basename: GLUCAP,  in the last 168  hours  Radiological Exams on Admission: Dg Chest Portable 1 View 07/07/2012   IMPRESSION: Stable. No acute cardiopulmonary abnormality.   Original Report Authenticated By: Erskine Speed, M.D.      Code Status: Full Family Communication: Pt at bedside Disposition Plan: Admit for further evaluation  Renee Passey, MD  [facility] Pager [phone]  If 7PM-7AM, please contact night-coverage [email or web address] Password [email or web address] 07/07/2012, 1:28 AM

## 2012-07-07 NOTE — Progress Notes (Signed)
ANTIBIOTIC CONSULT NOTE - INITIAL  Pharmacy Consult for Levaquin Indication: pneumonia  No Known Allergies  Patient Measurements: Height: 5' 6.14" (168 cm) Weight: 162 lb 0.6 oz (73.5 kg) IBW/kg (Calculated) : 59.63  Vital Signs: Temp: 98.7 F (37.1 C) (04/11 2343) Temp src: Oral (04/11 2343) BP: 150/63 mmHg (04/12 0157) Pulse Rate: 105 (04/12 0157) Intake/Output from previous day:   Intake/Output from this shift:    Labs:  Recent Labs  07/07/12 0015  WBC 11.3*  HGB 16.3*  PLT 282  CREATININE 0.99   Estimated Creatinine Clearance: 46.6 ml/min (by C-G formula based on Cr of 0.99). No results found for this basename: VANCOTROUGH, VANCOPEAK, VANCORANDOM, GENTTROUGH, GENTPEAK, GENTRANDOM, TOBRATROUGH, TOBRAPEAK, TOBRARND, AMIKACINPEAK, AMIKACINTROU, AMIKACIN,  in the last 72 hours   Microbiology: No results found for this or any previous visit (from the past 720 hour(s)).  Medical History: Past Medical History  Diagnosis Date  . Breast cancer 1991    s/p R lumpectomy and radiation   . Hyperlipidemia   . Hypertension   . DDD (degenerative disc disease)   . DJD (degenerative joint disease)     ankles  . Cluster headache     remote  . COPD (chronic obstructive pulmonary disease)     Golds Stage II feV1 63% 11/2009    Medications:  Scheduled:  . albuterol  2.5 mg Nebulization Q6H  . [COMPLETED] albuterol  5 mg Nebulization Once  . [COMPLETED] albuterol  10 mg/hr Nebulization Once  . buPROPion  150 mg Oral Daily  . cloNIDine  0.1 mg Oral BID  . enoxaparin (LOVENOX) injection  40 mg Subcutaneous Q24H  . Fluticasone Furoate-Vilanterol  1 puff Inhalation Daily  . guaiFENesin  600 mg Oral BID  . hydrALAZINE  25 mg Oral TID  . lisinopril  20 mg Oral Daily   And  . hydrochlorothiazide  12.5 mg Oral Daily  . [COMPLETED] ipratropium  0.5 mg Nebulization Once  . ipratropium  0.5 mg Nebulization Q6H  . [COMPLETED] ipratropium  1 mg Nebulization Once  .  levothyroxine  50 mcg Oral QAC breakfast  . [COMPLETED] methylPREDNISolone (SOLU-MEDROL) injection  125 mg Intravenous Once  . methylPREDNISolone (SOLU-MEDROL) injection  60 mg Intravenous Q12H  . nicotine  14 mg Transdermal Daily  . verapamil  240 mg Oral Daily  . [DISCONTINUED] lisinopril-hydrochlorothiazide  1 tablet Oral Daily   Infusions:  . sodium chloride     Assessment:  79 year old female presents with shortness of breath.  Admitted with acute respiratory failure with hypoxia - due to acute COPD exacerbation +/- PNA  Pharmacy asked to dose Levaquin for possible pneumonia  CrCl ~ 46 ml/min  Goal of Therapy:  Eradication of infection  Plan:  Levaquin 750mg  IV q48h Follow cultures, renal function, clinical improvement  Hendry Speas, Joselyn Glassman, PharmD 07/07/2012,3:13 AM

## 2012-07-07 NOTE — ED Provider Notes (Signed)
Medical screening examination/treatment/procedure(s) were conducted as a shared visit with non-physician practitioner(s) and myself.  I personally evaluated the patient during the encounter  12:51 AM Patient continues to have significant inspiratory x-ray wheezing despite albuterol and Atrovent neb treatment. We will place her on a continuous neb and anticipate admission to the hospital.  Hanley Seamen, MD 07/07/12 [phone]

## 2012-07-07 NOTE — ED Notes (Signed)
RT paged for continuous neb treatment

## 2012-07-08 DIAGNOSIS — J9600 Acute respiratory failure, unspecified whether with hypoxia or hypercapnia: Principal | ICD-10-CM

## 2012-07-08 LAB — GLUCOSE, CAPILLARY

## 2012-07-08 MED ORDER — PREDNISONE 20 MG PO TABS
30.0000 mg | ORAL_TABLET | Freq: Every day | ORAL | Status: DC
  Administered 2012-07-09: 30 mg via ORAL
  Filled 2012-07-08 (×2): qty 1

## 2012-07-08 MED ORDER — ONDANSETRON 8 MG/NS 50 ML IVPB
8.0000 mg | Freq: Three times a day (TID) | INTRAVENOUS | Status: DC | PRN
  Administered 2012-07-08: 8 mg via INTRAVENOUS
  Filled 2012-07-08 (×4): qty 8

## 2012-07-08 MED ORDER — LEVOFLOXACIN 750 MG PO TABS
750.0000 mg | ORAL_TABLET | ORAL | Status: DC
  Filled 2012-07-08: qty 1

## 2012-07-08 MED ORDER — LEVOFLOXACIN 750 MG PO TABS
750.0000 mg | ORAL_TABLET | ORAL | Status: DC
  Administered 2012-07-09: 750 mg via ORAL
  Filled 2012-07-08: qty 1

## 2012-07-08 MED ORDER — ONDANSETRON HCL 8 MG PO TABS
8.0000 mg | ORAL_TABLET | Freq: Three times a day (TID) | ORAL | Status: DC | PRN
  Administered 2012-07-08: 8 mg via ORAL
  Filled 2012-07-08: qty 1

## 2012-07-08 MED ORDER — HYDRALAZINE HCL 20 MG/ML IJ SOLN
10.0000 mg | Freq: Once | INTRAMUSCULAR | Status: AC
  Administered 2012-07-08: 10 mg via INTRAVENOUS
  Filled 2012-07-08: qty 1

## 2012-07-08 MED ORDER — ENSURE COMPLETE PO LIQD
237.0000 mL | Freq: Two times a day (BID) | ORAL | Status: DC
  Administered 2012-07-08 – 2012-07-09 (×3): 237 mL via ORAL

## 2012-07-08 MED ORDER — LORAZEPAM 0.5 MG PO TABS
0.5000 mg | ORAL_TABLET | Freq: Four times a day (QID) | ORAL | Status: DC | PRN
  Administered 2012-07-08: 0.5 mg via ORAL
  Filled 2012-07-08: qty 1

## 2012-07-08 NOTE — Progress Notes (Signed)
INITIAL NUTRITION ASSESSMENT  DOCUMENTATION CODES Per approved criteria  -Not Applicable   INTERVENTION: 1. Ensure Complete po BID, each supplement provides 350 kcal and 13 grams of protein. 2. Discussed dysphagia diet and why she was unable to order certain foods. Discussed foods that were appropriate so that she would not choke on her food. 3. Educated pt on why her shortness of breath has led to weight loss and increased her need for calories.   NUTRITION DIAGNOSIS: Inadequate oral intake related to COPD as evidenced by decreased meal intake and choking when eating.   Goal: Pt to meet >/= 90% of their estimated nutrition needs.  Monitor:  Speech notes, weight, po intake, labs  Reason for Assessment: Consult  79 y.o. female  Admitting Dx: Acute respiratory failure with hypoxia  ASSESSMENT: Pt presented with past medical history of COPD on home O2, HTN, anxiety, hypothyroidism who presented to [facility] ED with worsening shortness of breath started 1 day prior to this admission. She reports that she had gotten choked on her food and that they had put her on a more strict diet that wouldn't allow her to have bread. She wanted to know why she couldn't have bread and what her diet was for. She says that her appetite has decreased and that she has lost weight. Per her past weight history here, she has lost about 30 lbs since 12/13. She says that she wants to lose weight and would like to weigh 155 lbs. Since she has been skipping meals and has had poor po intake, she would like to try ensure as a supplement. A speech evaluation has been requested by the physician.  Height: Ht Readings from Last 1 Encounters:  07/07/12 5' 6.14" (1.68 m)    Weight: Wt Readings from Last 1 Encounters:  07/08/12 163 lb 2.3 oz (74 kg)    Ideal Body Weight: 59.6 kg  % Ideal Body Weight: 124%  Wt Readings from Last 10 Encounters:  07/08/12 163 lb 2.3 oz (74 kg)  05/15/12 162 lb (73.483 kg)  03/18/12 194  lb 14.2 oz (88.4 kg)  07/21/11 188 lb (85.276 kg)  12/07/10 180 lb 3.2 oz (81.738 kg)  10/06/10 183 lb 6.4 oz (83.19 kg)  01/20/10 186 lb 4 oz (84.482 kg)  01/01/10 185 lb (83.915 kg)  12/08/09 185 lb (83.915 kg)  12/01/09 186 lb (84.369 kg)    Usual Body Weight: about 190 lbs in December, 2013  % Usual Body Weight: 86%  BMI:  Body mass index is 26.22 kg/(m^2).  Estimated Nutritional Needs: Kcal: 1900-2100 Protein: 90-110 g Fluid: 2.2-2.5 L  Skin: WNL  Diet Order: Dysphagia  EDUCATION NEEDS: -Education needs addressed   Intake/Output Summary (Last 24 hours) at 07/08/12 0914 Last data filed at 07/08/12 0417  Gross per 24 hour  Intake 1818.75 ml  Output      0 ml  Net 1818.75 ml    Last BM: none recorded   Labs:   Recent Labs Lab 07/07/12 0015 07/07/12 0403  NA 138 139  K 4.3 3.7  CL 96 97  CO2 29 28  BUN 9 9  CREATININE 0.99 1.10  CALCIUM 9.0 8.9  GLUCOSE 114* 163*    CBG (last 3)   Recent Labs  07/07/12 0757 07/08/12 0756  GLUCAP 196* 144*    Scheduled Meds: . albuterol  2.5 mg Nebulization Q6H  . buPROPion  150 mg Oral Daily  . cloNIDine  0.1 mg Oral BID  . enoxaparin (LOVENOX)  injection  40 mg Subcutaneous Q24H  . Fluticasone Furoate-Vilanterol  1 puff Inhalation Daily  . guaiFENesin  600 mg Oral BID  . hydrALAZINE  25 mg Oral TID  . lisinopril  20 mg Oral Daily   And  . hydrochlorothiazide  12.5 mg Oral Daily  . ipratropium  0.5 mg Nebulization Q6H  . levofloxacin  750 mg Oral Q24H  . levothyroxine  50 mcg Oral QAC breakfast  . methylPREDNISolone (SOLU-MEDROL) injection  60 mg Intravenous Q12H  . nicotine  14 mg Transdermal Daily  . verapamil  240 mg Oral Daily    Continuous Infusions:   Past Medical History  Diagnosis Date  . Breast cancer 1991    s/p R lumpectomy and radiation   . Hyperlipidemia   . Hypertension   . DDD (degenerative disc disease)   . DJD (degenerative joint disease)     ankles  . Cluster headache      remote  . COPD (chronic obstructive pulmonary disease)     Golds Stage II feV1 63% 11/2009    Past Surgical History  Procedure Laterality Date  . Nose surgery  1993  . Chin lift  [id number]  . Toe surgery  1965    5th toe- right foot  . Breast lumpectomy  1991    Right breast    Renee Donovan RD, LDN

## 2012-07-08 NOTE — Progress Notes (Signed)
Called Pharmacy about [facility] inhaler.  They said that medication was newer and had not been approved for subsitutions. Patients grandson was going to bring the inhaler from home.  The patient perfers to have another medication ordered for her that we have here in the hospital.

## 2012-07-08 NOTE — Progress Notes (Signed)
Subjective: Ms. vanantwerp reports that she is feeling better, less SOB  Objective: Lab:  Recent Labs  07/07/12 0015 07/07/12 0403  WBC 11.3* 11.7*  NEUTROABS 8.0*  --   HGB 16.3* 15.7*  HCT 48.5* 47.3*  MCV 101.5* 103.1*  PLT 282 276    Recent Labs  07/07/12 0015 07/07/12 0403  NA 138 139  K 4.3 3.7  CL 96 97  GLUCOSE 114* 163*  BUN 9 9  CREATININE 0.99 1.10  CALCIUM 9.0 8.9    Imaging:  Scheduled Meds: . albuterol  2.5 mg Nebulization Q6H  . buPROPion  150 mg Oral Daily  . cloNIDine  0.1 mg Oral BID  . enoxaparin (LOVENOX) injection  40 mg Subcutaneous Q24H  . feeding supplement  237 mL Oral BID BM  . Fluticasone Furoate-Vilanterol  1 puff Inhalation Daily  . guaiFENesin  600 mg Oral BID  . hydrALAZINE  25 mg Oral TID  . lisinopril  20 mg Oral Daily   And  . hydrochlorothiazide  12.5 mg Oral Daily  . ipratropium  0.5 mg Nebulization Q6H  . [START ON 07/09/2012] levofloxacin  750 mg Oral Q48H  . levothyroxine  50 mcg Oral QAC breakfast  . nicotine  14 mg Transdermal Daily  . [START ON 07/09/2012] predniSONE  30 mg Oral Q breakfast  . verapamil  240 mg Oral Daily   Continuous Infusions:  PRN Meds:.acetaminophen, acetaminophen, albuterol, calcium carbonate, diphenoxylate-atropine, guaiFENesin-dextromethorphan, HYDROcodone-acetaminophen, ipratropium, menthol-cetylpyridinium, morphine injection, sodium chloride   Physical Exam: Filed Vitals:   07/08/12 0919  BP: 151/74  Pulse:   Temp:   Resp:    gen'l - chroncially ill appearing white woman Cor- 2+ radial, RRR Pulm - no increased WOB, no rales, no wheezes, generally decreased BS Abd- soft     Assessment/Plan: 1. Pul AECOPD - good response to antibiotics and steroids Plan Switch to prednisone 30 mg bid  2. HTN - ok contorl  3. Psych - stable  4. Dysphagia - change to dysphagia diet help. For SLP eval.     [name] [facility] (o) [phone]; (c) [phone] Call-grp - Patsi Sears IM   Tele: [phone]  07/08/2012, 1:54 PM

## 2012-07-08 NOTE — Progress Notes (Addendum)
Pt visiting with grandson. Pt denies any pain. O2 via [place] intact @ 3L. Pt using pull ups for incontinence. Appetite poor. Pt becoming very anxious stating she is having trouble breathing, O2 Sats-97%. Pt reminded to breathe through her nose not her mouth. Dr. Arthur Holms called, Ativan ordered ro anxiety.

## 2012-07-08 NOTE — Care Management Note (Unsigned)
    Page 1 of 1   07/08/2012     2:24:51 PM   CARE MANAGEMENT NOTE 07/08/2012  Patient:  Renee Donovan, Renee Donovan   Account Number:  [id number]  Date Initiated:  07/08/2012  Documentation initiated by:  Lanier Clam  Subjective/Objective Assessment:   ADMITTED W/SOB.ACUTE RESP FAILURE.     Action/Plan:   FROM HOME W/FAMILY.HAS PCP,PHARMACY.HAS HOME 02,NEBS-[facility].HAS CANE,RW,3N1.USED [facility] IN THE PAST.   Anticipated DC Date:  07/12/2012   Anticipated DC Plan:  HOME W HOME HEALTH SERVICES      DC Planning Services  CM consult      Choice offered to / List presented to:  C-1 Patient           Status of service:  In process, will continue to follow Medicare Important Message given?   (If response is "NO", the following Medicare IM given date fields will be blank) Date Medicare IM given:   Date Additional Medicare IM given:    Discharge Disposition:    Per UR Regulation:    If discussed at Long Length of Stay Meetings, dates discussed:    Comments:  07/08/12 Berry Gallacher RN,BSN NCM [phone] SPOKE TO PATIENT ABOUT D/C PLANS.WANTS HOME.AGREE TO [facility] FOR HH.PT-HH.FAXED W/CONFIRMATION TO [facility]#[phone]- INFORMED OF REFERRAL FAX#[phone]-FACE SHEET,H&P,PT EVAL.

## 2012-07-08 NOTE — Progress Notes (Signed)
Spoke with Dr Arthur Holms.  He said it was ok for her to use her home breo ellipta inhaler.

## 2012-07-09 ENCOUNTER — Telehealth: Payer: Self-pay | Admitting: Internal Medicine

## 2012-07-09 DIAGNOSIS — E039 Hypothyroidism, unspecified: Secondary | ICD-10-CM

## 2012-07-09 MED ORDER — LORAZEPAM 2 MG/ML IJ SOLN
0.5000 mg | Freq: Four times a day (QID) | INTRAMUSCULAR | Status: DC | PRN
  Administered 2012-07-09: 0.5 mg via INTRAVENOUS
  Filled 2012-07-09: qty 1

## 2012-07-09 MED ORDER — LEVOFLOXACIN 750 MG PO TABS: 750.0000 mg | ORAL_TABLET | Freq: Every day | ORAL | Status: DC

## 2012-07-09 MED ORDER — PREDNISONE 10 MG PO TABS: 30.0000 mg | ORAL_TABLET | ORAL | Status: DC

## 2012-07-09 MED ORDER — LORAZEPAM 0.5 MG PO TABS: 0.5000 mg | ORAL_TABLET | Freq: Four times a day (QID) | ORAL | Status: DC | PRN

## 2012-07-09 MED ORDER — LEVOTHYROXINE SODIUM 50 MCG PO TABS: 50.0000 ug | ORAL_TABLET | Freq: Every day | ORAL | Status: AC

## 2012-07-09 MED ORDER — FLUTICASONE FUROATE-VILANTEROL 100-25 MCG/INH IN AEPB: 1.0000 | INHALATION_SPRAY | Freq: Every day | RESPIRATORY_TRACT | Status: DC

## 2012-07-09 MED ORDER — ENSURE COMPLETE PO LIQD: 237.0000 mL | Freq: Two times a day (BID) | ORAL | Status: DC

## 2012-07-09 MED ORDER — ONDANSETRON HCL 4 MG PO TABS: 4.0000 mg | ORAL_TABLET | Freq: Three times a day (TID) | ORAL | Status: DC | PRN

## 2012-07-09 MED ORDER — LISINOPRIL-HYDROCHLOROTHIAZIDE 20-12.5 MG PO TABS: 1.0000 | ORAL_TABLET | Freq: Every day | ORAL | Status: DC

## 2012-07-09 NOTE — Progress Notes (Signed)
PT Cancellation Note  Patient Details Name: RAEGAN WINDERS MRN: [id number] DOB: 1930-09-12   Cancelled Treatment:    Reason Eval/Treat Not Completed: Medical issues which prohibited therapy. Pt not feeling well, had nausea and vomiting last night. Will follow.    Ralene Bathe Kistler 07/09/2012, 9:26 AM [phone]

## 2012-07-09 NOTE — Telephone Encounter (Signed)
Called patient asking her to schedule the follow up appointment.  She states that she is unable to come in due to not feeling well.  I mentioned that we were hoping to see her within a week.  She stated she would call back when she was feeling better.

## 2012-07-09 NOTE — Progress Notes (Signed)
Pt was nauseated during the night, vomited 200 ml  dark brown emesis,. Give oral zofran as order, but pt continue to vomit and maintain no change, notified the on call, and received a new order for IV zofran. Pt seem to have tolerated  That better, nausea subsided and pt is resting in bed with out any problem at this time,

## 2012-07-09 NOTE — Progress Notes (Signed)
Pt ready for d/c. Went over d/c instructions, new meds, and current meds with pt and pt's grandson. No change in pt's condition since AM assessment. Removed PIV, WNL. D/c'd to home on O2 with grandson. Eugene Garnet RN

## 2012-07-09 NOTE — Progress Notes (Signed)
OT Cancellation Note  Patient Details Name: Renee Donovan MRN: [id number] DOB: May 18, 1930   Cancelled Treatment:    Reason Eval/Treat Not Completed: Other (comment) (pt states she is too nauseous to get up right now. She states she is supposed to be discharging later today so encouraged her to try to get up with OT but she states she just couldn't due to the nausea. She states she has all DME and assist at home. Will try back as schedule allows.)  [name] [phone] 07/09/2012, 12:50 PM

## 2012-07-09 NOTE — Progress Notes (Signed)
BSE  BSE completed, full report to follow.  Recommend continue soft/ground meats/thin with strict aspiration/reflux precautions.  No overt clinical indications of aspiration with pudding/water.  Pt's primary complaint is vomiting after she eats, which she states has been occuring infrequently at home x 1 - 1 1/2 months.     Pt has h/o UGI study being completed approximately 30-40 years ago but she does not recall clinical indications or test results.  Provided pt with verbal/written tips for xerostomia, GERD, precautions and compensations to mitigate aspiration risk.    Rec pt monitor her swallow function at home and follow up with Dr Debby Bud if it does not improve.  She may benefit from MD considering ordering a dedicated esophageal evaluation if dysphagia does not improve- as her symptoms appear consistent with GI deficits.      Thanks.   Donavan Burnet, MS [facility] SLP [phone]

## 2012-07-09 NOTE — Progress Notes (Signed)
Pt's CBG this AM was 207. Notified Dr. Debby Bud, no new orders received. Eugene Garnet RN

## 2012-07-09 NOTE — Telephone Encounter (Signed)
Message copied by Newell Coral on Mon Jul 09, 2012  4:48 PM ------      Message from: [name] E      Created: Mon Jul 09, 2012 12:07 PM       1,. Needs transition phone call      2. F/u appintment 7-10 with one of my partners            Thanks ------

## 2012-07-09 NOTE — Care Management (Signed)
UR complete  Azya Barbero,RN,BSN [phone] 

## 2012-07-09 NOTE — Evaluation (Signed)
Clinical/Bedside Swallow Evaluation Patient Details  Name: Renee Donovan MRN: [id number] Date of Birth: 1930/08/14  Today's Date: 07/09/2012 Time: 1215-1252 SLP Time Calculation (min): 37 min  Past Medical History:  Past Medical History  Diagnosis Date  . Breast cancer 1991    s/p R lumpectomy and radiation   . Hyperlipidemia   . Hypertension   . DDD (degenerative disc disease)   . DJD (degenerative joint disease)     ankles  . Cluster headache     remote  . COPD (chronic obstructive pulmonary disease)     Golds Stage II feV1 63% 11/2009   Past Surgical History:  Past Surgical History  Procedure Laterality Date  . Nose surgery  1993  . Chin lift  [id number]  . Toe surgery  1965    5th toe- right foot  . Breast lumpectomy  1991    Right breast   HPI:  79 yo female adm to [facility] with acute respiratory failure, hypoxia.  PMH + for COPD,  HTN, hypothryroidism diagnosed with ? CAP.  Breathing is better today per pt.  CXR 4/12 negative.    Swallow evaluation was ordered as pt admits to "choking" on foods.  Per interview with pt, "I can get it down, but then it comes back up."  Pt pt, this regurgitation does not occur immediately but a "little later".  Pt admits to taking Tums PRN for reflux but has not noticed this worsening in the last 1-2 months.   Within the last 1-2 months, pt reports occasional vomiting at home.  Her intake in the hospital has been poor due to nausea.    She also had an UGI study completed approx 30-40 years ago per her statement, but she does not recall results.  Sensation of food lodging in pharynx reported at times, requiring pt to cough and expectorate to clear.  She also admits that she is gagging on mucus at times.   Pt admits to weight loss at home due to lack of appetitie.     Assessment / Plan / Recommendation Clinical Impression  Limited po observed due to pt declining to eat more than a few bites *pt c/o nausea.  CN exam unremarkable and pt did not  demonstrate difficulties swallowing water via straw and 5 bites of pudding.     Suspect pt's primary difficulties are GI given her report of vomiting or regurgitation that occurs "a while" after eating.   Provided pt with verbal and written aspiration precautions and items better tolerated if aspirated (ie water and liquids better than solids) due to her COPD.     Advised pt to monitor her swallowing ability closely, writing specifics down re: deficits and informing Dr Debby Bud if her swallow does not improve.  Xerostomia tips also provided.    Thanks for this referral.      Aspiration Risk  Mild    Diet Recommendation Dysphagia 3 (Mechanical Soft);Dysphagia 2 (Fine chop);Thin liquid   Medication Administration:  (as tolerated) Supervision: Patient able to self feed Compensations: Slow rate;Small sips/bites (eat several small meals/day) Postural Changes and/or Swallow Maneuvers: Seated upright 90 degrees;Upright 30-60 min after meal    Other  Recommendations Oral Care Recommendations: Oral care BID   Follow Up Recommendations  None    Frequency and Duration        Pertinent Vitals/Pain Afebrile, on oxygen    SLP Swallow Goals  n/a eval educate and dc   Swallow Study Prior Functional Status   Pt  eats many microwave meals at home and has someone cook for her at other times.      General Date of Onset: 07/09/12 HPI: 79 yo female adm to [facility] with acute respiratory failure, hypoxia.  PMH + for COPD,  HTN, hypothryroidism diagnosed with ? CAP.  Breathing is better today per pt.  CXR 4/12 negative.    Swallow evaluation was ordered as pt admits to "choking" on foods.  Per interview with pt, "I can get it down, but then it comes back up."  Pt pt, this regurgitation does not occur immediately but a "little later".  Pt admits to taking Tums PRN for reflux but has not noticed this worsening in the last 1-2 months.   Within the last 1-2 months, pt reports occasional vomiting at home.  Her  intake in the hospital has been poor due to nausea.    She also had an UGI study completed approx 30-40 years ago per her statement, but she does not recall results.  Sensation of food lodging in pharynx reported at times, requiring pt to cough and expectorate to clear.  She also admits that she is gagging on mucus at times.   Pt admits to weight loss at home due to lack of appetitie.   Type of Study: Bedside swallow evaluation Diet Prior to this Study: Dysphagia 2 (chopped);Thin liquids Temperature Spikes Noted: No Respiratory Status: Supplemental O2 delivered via (comment) Behavior/Cognition: Alert;Cooperative;Agitated (pt became agitated with slp questions) Oral Cavity - Dentition: Adequate natural dentition Self-Feeding Abilities: Able to feed self Patient Positioning: Upright in bed Baseline Vocal Quality: Hoarse;Low vocal intensity Volitional Cough: Strong Volitional Swallow: Able to elicit    Oral/Motor/Sensory Function Overall Oral Motor/Sensory Function: Appears within functional limits for tasks assessed   Ice Chips Ice chips: Not tested   Thin Liquid Thin Liquid: Within functional limits Presentation: Straw    Nectar Thick Nectar Thick Liquid: Not tested   Honey Thick Honey Thick Liquid: Not tested   Puree Puree: Within functional limits Presentation: Self Fed;Spoon Other Comments: pudding, chocolate, pt consumed approx six bites   Solid   GO    Other Comments: pt declined to attempt to eat solid foods        Donavan Burnet, MS [facility] SLP [phone]

## 2012-07-09 NOTE — Progress Notes (Signed)
Subjective: Feeling better. Did have N/V overnight - zofran per TRH. She wants to go home  Objective: Lab:  Recent Labs  07/07/12 0015 07/07/12 0403  WBC 11.3* 11.7*  NEUTROABS 8.0*  --   HGB 16.3* 15.7*  HCT 48.5* 47.3*  MCV 101.5* 103.1*  PLT 282 276    Recent Labs  07/07/12 0015 07/07/12 0403  NA 138 139  K 4.3 3.7  CL 96 97  GLUCOSE 114* 163*  BUN 9 9  CREATININE 0.99 1.10  CALCIUM 9.0 8.9    Imaging:  Scheduled Meds: . albuterol  2.5 mg Nebulization Q6H  . buPROPion  150 mg Oral Daily  . cloNIDine  0.1 mg Oral BID  . enoxaparin (LOVENOX) injection  40 mg Subcutaneous Q24H  . feeding supplement  237 mL Oral BID BM  . Fluticasone Furoate-Vilanterol  1 puff Inhalation Daily  . guaiFENesin  600 mg Oral BID  . hydrALAZINE  25 mg Oral TID  . lisinopril  20 mg Oral Daily   And  . hydrochlorothiazide  12.5 mg Oral Daily  . ipratropium  0.5 mg Nebulization Q6H  . levofloxacin  750 mg Oral Q48H  . levothyroxine  50 mcg Oral QAC breakfast  . nicotine  14 mg Transdermal Daily  . predniSONE  30 mg Oral Q breakfast  . verapamil  240 mg Oral Daily   Continuous Infusions:  PRN Meds:.acetaminophen, acetaminophen, albuterol, calcium carbonate, diphenoxylate-atropine, guaiFENesin-dextromethorphan, HYDROcodone-acetaminophen, ipratropium, LORazepam, LORazepam, menthol-cetylpyridinium, morphine injection, ondansetron (ZOFRAN) IV, sodium chloride   Physical Exam: Filed Vitals:   07/09/12 0648  BP: 133/65  Pulse: 70  Temp: 98.1 F (36.7 C)  Resp: 18   See d/c/ summary     Assessment/Plan: For d/c home after SLP evaluation. Dictated    [name] [place] IM (o) [phone]; (c) [phone] Call-grp - Patsi Sears IM  Tele: [phone]  07/09/2012, 11:36 AM

## 2012-07-10 NOTE — Discharge Summary (Signed)
NAMEMarland Kitchen  Renee, Donovan NO.:  [id number]  MEDICAL RECORD NO.:  [id number]  LOCATION:  1335                         FACILITY:  [facility]  PHYSICIAN:  Rosalyn Gess. Ladasha Schnackenberg, MD  DATE OF BIRTH:  09-10-1930  DATE OF ADMISSION:  07/06/2012 DATE OF DISCHARGE:  07/09/2012                              DISCHARGE SUMMARY   ADMITTING DIAGNOSES: 1. Acute exacerbation of chronic obstructive pulmonary disease. 2. Acute respiratory failure with hypoxemia secondary to 1. 3. Question of community-acquired pneumonia. 4. Leukocytosis. 5. Hypertension. 6. Hypothyroid disease. 7. Anxiety.  DISCHARGE DIAGNOSES: 1. Acute exacerbation of chronic obstructive pulmonary disease. 2. Acute respiratory failure with hypoxemia secondary to 1. 3. Question of community-acquired pneumonia. 4. Leukocytosis. 5. Hypertension. 6. Hypothyroid disease. 7. Anxiety.  CONSULTANTS:  None.  PROCEDURES AND IMAGING:  Chest x-ray, July 07, 2012, with no acute cardiopulmonary abnormality noted, specifically no infiltrates.  HISTORY OF PRESENT ILLNESS:  Renee Donovan is an 79 year old woman with history of COPD oxygen dependent, hypertension, anxiety hypothyroid disease, presented to [facility] Emergency Department complaining of increasing shortness of breath which was started 1 day prior to admission.  She reported having a chronic cough that is nonproductive. She had tried nebulizer treatments at home but she got no relief.  She denied chest pain, palpitations, fever or chills.  She had no abdominal pain, nausea or vomiting, diarrhea or constipation.  Because of her relative hypoxemia and concern for acute exacerbation by infection, she was admitted to the hospital for further treatment.  Please see the H and P for past medical history, family history, social history, and admission examination.  HOSPITAL COURSE: 1. Pulmonary.  The patient with acute exacerbation of COPD.  She was     switched from IV  to p.o. Levaquin and continued to do well.  She     was switched from IV Solu-Medrol to p.o. prednisone and continued     to do well, with no significant respiratory distress or wheezing.     Oxygen saturations were adequate on 2 L of oxygen.  Cough was     controlled.  With the patient being stable on oral medications, she     is now ready for discharge to home. 2. Hypertension.  The patient is well controlled on her home regimen     which will be continued. 3. Anxiety.  The patient does take multiple anti-psychotropic     medications and these will be continued. 4. Dysphagia.  The patient complained of difficulty with swallow.     Speech pathology evaluation was ordered and pending at the time of     this dictation.  She has been put to dysphagia 2 diet.  Plan, we     will await FLP evaluation prior to discharge, but she is clear for     discharge once this is completed.  DISCHARGE EXAMINATION:  VITAL SIGNS:  Temperature was 98.1, blood pressure 133/65, heart rate was 70, respirations were 18, oxygen saturation was 93%. GENERAL APPEARANCE:  This is a chronically ill appearing, elderly Caucasian woman, in no acute distress. HEENT:  Conjunctiva and sclerae were clear. NECK:  Supple. COR:  2+ peripheral pulses noted.  She has a quiet  precordium.  Her heart rate is regular. PULMONARY:  The patient with no increased work of breathing. Auscultation revealed no rales or wheezes.  She does have a prolonged expiratory phase. ABDOMEN:  Obese, soft.  No guarding or rebound.  No organosplenomegaly was noted.  Bowel sounds are positive. GENITALIA:  Deferred. EXTREMITIES:  Without clubbing, cyanosis, or edema. NEUROLOGIC:  The patient is awake, alert, and oriented to person, place, time and context.  LABORATORY:  From April 12, chemistries with sodium 139, potassium 3.7, chloride 97, CO2 of 28, BUN of 9, creatinine 1.1, glucose was 163. Liver functions were normal.  Albumin was slightly low  at 3.  CBC with a white count 11,700, hemoglobin 15.7 g, platelet count 276,000. Differential with 71% segs, 18% lymphs, 10% monos, 1% eosinophils.  DISPOSITION:  The patient is discharged to home.  We will continue home health and face-to-face encounter form is completed.  She will resume her home medications, plus acute medications as listed: 1. Albuterol HFA 2 puffs every 6 hours as needed for wheezing. 2. Albuterol 2.5 mg/3 mL nebulizer treatments every 6 hours as needed     for wheezing. 3. Bupropion which is Wellbutrin 150 mg q. a.m. 4. Clonidine 0.1 mg every a.m. 5. Lomotil 1 every 6 hours as needed for diarrhea not to exceed 4     doses per 24 hours. 6. Ensure at least 2 cans daily as a dietary supplement. 7. The patient will use Breo Ellipta 1 inhalation daily which is     existing home medication. 8. Hydralazine 25 mg 3 times a day for blood pressure management. 9. Levothyroxine 50 mg mcg daily.  The patient to resume her home     supply. 10.Progesterone 4 mg p.o. q.6 hours p.r.n. 11.Prednisone as it tapers see instructions #31 provided, traction #32     provided. 12.Ocean spray nasal spray as needed. 13.Spiriva 18 mcg 1 inhalation daily. 14.Verapamil 240 mg extended release once daily. 15.Hydrochlorothiazide 12.5 mg daily.  The patient immediately seen for followup in 7-10 days.  In the office, we will call her with an appointment with 1 of my colleagues at  [employer] office of [address].  The patient's condition at the time of discharge and dictation is guarded given her advanced age, multiple comorbidities, and lack of adherence to instructions.  The patient's home health services will be resumed as noted above.     Rosalyn Gess Kamali Nephew, MD     MEN/MEDQ  D:  07/09/2012  T:  07/10/2012  Job:  [id number]

## 2012-07-11 ENCOUNTER — Telehealth: Payer: Self-pay

## 2012-07-11 NOTE — Telephone Encounter (Signed)
Called Renee Donovan no answer LMOM with Renee Donovan response...Renee Donovan

## 2012-07-11 NOTE — Telephone Encounter (Signed)
Phone call from [place], [phone] (fax#[phone]), nurse at [facility] states pt was d/c from hospital yesterday and she saw at her home today. Pt is needing help in the home and requesting orders for social worker and occupational therapy. The orders can be called in or faxed. Please advise.

## 2012-07-11 NOTE — Telephone Encounter (Signed)
Renee Donovan, OK for verbal order for social work and OT referral. [name]

## 2012-07-19 DIAGNOSIS — Z9981 Dependence on supplemental oxygen: Secondary | ICD-10-CM

## 2012-07-19 DIAGNOSIS — I10 Essential (primary) hypertension: Secondary | ICD-10-CM

## 2012-07-19 DIAGNOSIS — Z853 Personal history of malignant neoplasm of breast: Secondary | ICD-10-CM

## 2012-07-19 DIAGNOSIS — J189 Pneumonia, unspecified organism: Secondary | ICD-10-CM

## 2012-07-19 DIAGNOSIS — J441 Chronic obstructive pulmonary disease with (acute) exacerbation: Secondary | ICD-10-CM

## 2012-07-20 ENCOUNTER — Telehealth: Payer: Self-pay | Admitting: *Deleted

## 2012-07-20 NOTE — Telephone Encounter (Signed)
noted 

## 2012-07-20 NOTE — Telephone Encounter (Signed)
Left msg on triage stating wanting to inform md pt is refusing home health services. Have tried to reach out to pt several times, and she states she doesn't need their services. Will be d/c pt...Raechel Chute

## 2012-08-01 ENCOUNTER — Other Ambulatory Visit: Payer: Self-pay

## 2012-08-01 MED ORDER — CLONIDINE HCL 0.1 MG PO TABS: 0.1000 mg | ORAL_TABLET | Freq: Every morning | ORAL | Status: DC

## 2012-08-01 MED ORDER — VERAPAMIL HCL 240 MG (CO) PO TB24: 240.0000 mg | ORAL_TABLET | Freq: Every morning | ORAL | Status: DC

## 2012-08-15 ENCOUNTER — Telehealth: Payer: Self-pay

## 2012-08-15 NOTE — Telephone Encounter (Signed)
Pt notified Dr Felicity Coyer feels Sherrye Payor is a better medication. She does not agree. I advised pt Dr Debby Bud will be back in the office next Tuesday. She agrees to have her message sent to Dr Debby Bud. I let her know I will do so and be back in touch next week. She had no further concerns or questions.

## 2012-08-15 NOTE — Telephone Encounter (Signed)
Phone call from stating she is currently on Spiriva and she does not care for this medication as far as having to crush the capsule etc. She no longer wants to be on it. She would rather be on Breo. Please advise of rx change. Pt uses [facility] on [address].

## 2012-08-15 NOTE — Telephone Encounter (Signed)
I feel Spiriva is better medication - she should continue this without change until she can discuss in further detail with Norins - thanks

## 2012-08-21 NOTE — Telephone Encounter (Signed)
Pt notified and had no other concerns or questions 

## 2012-08-21 NOTE — Telephone Encounter (Signed)
Renee Donovan is very similar to spiriva and is much more expensive and unlikely to be covered. Please stay with the spiriva - it is an important part of your treatment

## 2012-09-07 ENCOUNTER — Other Ambulatory Visit: Payer: Self-pay | Admitting: Internal Medicine

## 2012-09-07 NOTE — Telephone Encounter (Signed)
Lomotil called to pharmacy  

## 2012-11-05 ENCOUNTER — Encounter: Payer: Self-pay | Admitting: Internal Medicine

## 2012-11-06 ENCOUNTER — Telehealth: Payer: Self-pay | Admitting: *Deleted

## 2012-11-06 NOTE — Telephone Encounter (Signed)
Patient states she is unable to make it in for an office visit. Instructed patient to use betadine soak 5 minutes, then wash with soap and water, rinse, pat dry and cover with a dry gauze.   [facility] states the brand name is not available

## 2012-11-06 NOTE — Telephone Encounter (Signed)
1. [facility] - is brand name available? There is no substitute for this product. May have brandname.  2. Recommend office visit for wound evaluation. Routine wound care: betadine soak for 5 minutes, then wash with soap and water, rinse, pat dry and cover with dry gauze dressing.

## 2012-11-06 NOTE — Telephone Encounter (Signed)
Pt called states she has an Ulceration on her left leg.  Pt is requesting an Antibiotic to be called in.  Advised appoint. Would be needed.  Pt declined appoint.  States she would like something called in.  Please advise

## 2012-11-06 NOTE — Telephone Encounter (Signed)
Received a fax from [facility] pharmacy  [address] [phone]. Patient's Bupropion XL 150 is on back order. Can you temporarily change until this medication becomes available? Please advise. Thank you.

## 2012-11-19 ENCOUNTER — Other Ambulatory Visit: Payer: Self-pay | Admitting: Internal Medicine

## 2012-11-20 ENCOUNTER — Telehealth: Payer: Self-pay

## 2012-11-20 MED ORDER — BUPROPION HCL 100 MG PO TABS: ORAL_TABLET | ORAL | Status: DC

## 2012-11-20 NOTE — Telephone Encounter (Signed)
wellbutrin 100 mg AM and early PM (after lunch), #60, refill x1 short term substitution for wellbutrin XL 150

## 2012-11-20 NOTE — Telephone Encounter (Signed)
Prescription sent

## 2012-11-20 NOTE — Telephone Encounter (Signed)
Received a fax from [facility] pharmacy [phone] stating Bupropion XL 150 mg is on backorder. Patient is needing a refill. Can you temporarily change until XL 150 mg is available again? Please advise. Thanks .

## 2012-11-20 NOTE — Telephone Encounter (Signed)
Lomotil called to pharmacy  

## 2012-11-23 ENCOUNTER — Other Ambulatory Visit: Payer: Self-pay | Admitting: Critical Care Medicine

## 2012-12-08 ENCOUNTER — Other Ambulatory Visit: Payer: Self-pay | Admitting: Internal Medicine

## 2012-12-10 NOTE — Telephone Encounter (Signed)
Lomotil called to pharmacy

## 2012-12-26 ENCOUNTER — Other Ambulatory Visit: Payer: Self-pay | Admitting: Critical Care Medicine

## 2012-12-26 NOTE — Telephone Encounter (Signed)
No other recs

## 2012-12-26 NOTE — Telephone Encounter (Signed)
Received albuterol neb rx request. Pt was last seen by PW on 02/08/12 and was asked to f/u in 2 months.  Last OV pt instructions are as follows:  Patient Instructions    Trial Breo one puff daily, use samples and free one month refill  Stop Brovana and Budesonide in nebulizer  You can continue albuterol inhaler or nebulizer as needed  Return 2 months  Call with report on how your are doing on Breo   ------  Called, spoke with pt to see if we can schedule a f/u with Dr. Delford Field.  Pt states she is now bedridden and is unable to come out for this appt. Pt states she hasn't been seen by a dr in a while and hasn't been able to come in to see Dr. Arthur Holms either d/t this.  Advised I would sent albuterol rx and would send msg to PW to let him know of this.  She verbalized understanding.  Dr. Delford Field, pls advise if you have any recs for pt regarding this.  Thank you.

## 2012-12-28 ENCOUNTER — Other Ambulatory Visit: Payer: Self-pay | Admitting: Internal Medicine

## 2012-12-28 NOTE — Telephone Encounter (Signed)
Lomotil called to pharmacy

## 2013-01-02 ENCOUNTER — Encounter: Payer: Self-pay | Admitting: Internal Medicine

## 2013-01-02 ENCOUNTER — Ambulatory Visit (INDEPENDENT_AMBULATORY_CARE_PROVIDER_SITE_OTHER): Payer: Medicare Other | Admitting: Internal Medicine

## 2013-01-02 ENCOUNTER — Other Ambulatory Visit (INDEPENDENT_AMBULATORY_CARE_PROVIDER_SITE_OTHER): Payer: Medicare Other

## 2013-01-02 VITALS — BP 168/88 | HR 65 | Temp 98.3°F

## 2013-01-02 DIAGNOSIS — F1010 Alcohol abuse, uncomplicated: Secondary | ICD-10-CM

## 2013-01-02 DIAGNOSIS — E039 Hypothyroidism, unspecified: Secondary | ICD-10-CM

## 2013-01-02 DIAGNOSIS — L8992 Pressure ulcer of unspecified site, stage 2: Secondary | ICD-10-CM

## 2013-01-02 DIAGNOSIS — L89892 Pressure ulcer of other site, stage 2: Secondary | ICD-10-CM

## 2013-01-02 DIAGNOSIS — I10 Essential (primary) hypertension: Secondary | ICD-10-CM

## 2013-01-02 DIAGNOSIS — J438 Other emphysema: Secondary | ICD-10-CM

## 2013-01-02 DIAGNOSIS — F4323 Adjustment disorder with mixed anxiety and depressed mood: Secondary | ICD-10-CM

## 2013-01-02 DIAGNOSIS — J439 Emphysema, unspecified: Secondary | ICD-10-CM

## 2013-01-02 DIAGNOSIS — Z23 Encounter for immunization: Secondary | ICD-10-CM

## 2013-01-02 DIAGNOSIS — L89899 Pressure ulcer of other site, unspecified stage: Secondary | ICD-10-CM

## 2013-01-02 LAB — CBC WITH DIFFERENTIAL/PLATELET
Basophils Absolute: 0.1 10*3/uL (ref 0.0–0.1)
Basophils Relative: 0.6 % (ref 0.0–3.0)
Eosinophils Absolute: 0.2 10*3/uL (ref 0.0–0.7)
Eosinophils Relative: 1.7 % (ref 0.0–5.0)
HCT: 41.1 % (ref 36.0–46.0)
Lymphs Abs: 1.9 10*3/uL (ref 0.7–4.0)
MCHC: 33.7 g/dL (ref 30.0–36.0)
MCV: 92 fl (ref 78.0–100.0)
Monocytes Relative: 8.5 % (ref 3.0–12.0)
Platelets: 314 10*3/uL (ref 150.0–400.0)
RBC: 4.47 Mil/uL (ref 3.87–5.11)
WBC: 11.7 10*3/uL — ABNORMAL HIGH (ref 4.5–10.5)

## 2013-01-02 LAB — COMPREHENSIVE METABOLIC PANEL
ALT: 13 U/L (ref 0–35)
AST: 12 U/L (ref 0–37)
Albumin: 3.6 g/dL (ref 3.5–5.2)
Calcium: 9 mg/dL (ref 8.4–10.5)
Chloride: 99 mEq/L (ref 96–112)
Glucose, Bld: 101 mg/dL — ABNORMAL HIGH (ref 70–99)
Potassium: 4.3 mEq/L (ref 3.5–5.1)

## 2013-01-02 LAB — MAGNESIUM: Magnesium: 2.1 mg/dL (ref 1.5–2.5)

## 2013-01-02 LAB — TSH: TSH: 9.22 u[IU]/mL — ABNORMAL HIGH (ref 0.35–5.50)

## 2013-01-02 MED ORDER — BUPROPION HCL ER (XL) 300 MG PO TB24: 300.0000 mg | ORAL_TABLET | Freq: Every day | ORAL | Status: AC

## 2013-01-02 MED ORDER — TIOTROPIUM BROMIDE MONOHYDRATE 18 MCG IN CAPS: 18.0000 ug | ORAL_CAPSULE | Freq: Every day | RESPIRATORY_TRACT | Status: AC

## 2013-01-02 MED ORDER — VERAPAMIL HCL 240 MG (CO) PO TB24: 240.0000 mg | ORAL_TABLET | Freq: Every morning | ORAL | Status: DC

## 2013-01-02 MED ORDER — CLONIDINE HCL 0.1 MG PO TABS: 0.1000 mg | ORAL_TABLET | Freq: Every morning | ORAL | Status: DC

## 2013-01-02 MED ORDER — FLUTICASONE FUROATE-VILANTEROL 100-25 MCG/INH IN AEPB: 1.0000 | INHALATION_SPRAY | Freq: Every day | RESPIRATORY_TRACT | Status: DC

## 2013-01-02 NOTE — Patient Instructions (Signed)
Thank you for making the effort to come in. It makes it easier to care for you when I can see you from time to time  COPD/emphysema - progressive disease and we want to help you do as good as is possible. Plan Oxygen 24/7  Don't SMOKE  Breo Ellipta One inhalation daily. This is a MAINTENANCE medication to keep you out of trouble  Spirva Once a day. A maintenance medication  Albuterol nebulizer every 4 hours as needed for breakthrough breathing trouble. If you need to use it every day we need to adjust you maintenance medication. If you need to use it more than 3 times a day you need to BE SEEN.  Wound care - will place an unna boot on today. Home health RN will change in 1 week.   Alcohol - please try to limit yourself to 2 glasses of wine per day.  Depression/anxiety - changing to Wellbutrin XL 300 mg once every morning.  Blood pressure - a little high. Please continue to take all your meds. Home health nurse will monitor and report back to me.   Lab today: general chemistry today.   Flu shot today.

## 2013-01-02 NOTE — Progress Notes (Signed)
Subjective:    Patient ID: Renee Donovan, female    DOB: January 28, 1931, 79 y.o.   MRN: [id number]  HPI Renee Donovan presents for follow up. She was admitted in April '14 for AECOPD. She has not been seen since.   She reports that she is having increased trouble breathing. She is using abuterol Neb 3-4 times a day. She is not using a Laba or long-acting steroid. She does continue Spiriva. She is on Oxygen 24/7.  She does continue to drink 16 oz per day.   She reports good appetite and has a normal bowel habit.   She is taking her BP meds; is irregular with thyroid medication.  Past Medical History  Diagnosis Date  . Breast cancer 1991    s/p R lumpectomy and radiation   . Hyperlipidemia   . Hypertension   . DDD (degenerative disc disease)   . DJD (degenerative joint disease)     ankles  . Cluster headache     remote  . COPD (chronic obstructive pulmonary disease)     Golds Stage II feV1 63% 11/2009   Past Surgical History  Procedure Laterality Date  . Nose surgery  1993  . Chin lift  [id number]  . Toe surgery  1965    5th toe- right foot  . Breast lumpectomy  1991    Right breast   Family History  Problem Relation Age of Onset  . Coronary artery disease Father   . Heart attack Father   . Diabetes Father   . Breast cancer Mother    History   Social History  . Marital Status: Divorced    Spouse Name: N/A    Number of Children: 4  . Years of Education: 12   Occupational History  . retired     Art therapist, Audiological scientist estate   Social History Main Topics  . Smoking status: Current Every Day Smoker -- 0.50 packs/day for 60 years    Types: Cigarettes  . Smokeless tobacco: Never Used     Comment: e cigarrette///02/08/12  . Alcohol Use: Yes     Comment: 4 glasses of wine daily  . Drug Use: No  . Sexual Activity: Not on file   Other Topics Concern  . 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 Married 1956- 3 years,  divorced 58- 26 years- divorced   1 son- 75, 3 adopted daughter 59, 91, 78: 2 grandchildren. Work- IT consultant, Therapist, sports, Research officer, political party, retired. $$- tight. Lives alone- house owned/paid for. Son lives nearby   ACP-Living will- wants to full code. Did discuss probabilities of success and chance of permanent mechanical ventilation. Will discuss further at next visit. (April '13).  Current smoker 1 ppd x 60 years. 4 glasses of wine daily.    Current Outpatient Prescriptions on File Prior to Visit  Medication Sig Dispense Refill  . albuterol (PROVENTIL HFA;VENTOLIN HFA) 108 (90 BASE) MCG/ACT inhaler Inhale 2 puffs into the lungs every 6 (six) hours as needed for wheezing.      Marland Kitchen albuterol (PROVENTIL) (2.5 MG/3ML) 0.083% nebulizer solution INHALE CONTENTS OF ONE VIAL VIA NEBULIZER EVERY 6 HOURS AS NEEDED FOR WHEEZING  90 mL  1  . buPROPion (WELLBUTRIN XL) 150 MG 24 hr tablet Take 1 tablet (150 mg total) by mouth daily.  30 tablet  5  . buPROPion (WELLBUTRIN) 100 MG tablet Take 1 tablet in the morning and 1 tablet early afternoon (after lunch)  60 tablet  1  . cloNIDine (CATAPRES) 0.1 MG tablet Take 1 tablet (0.1 mg total) by mouth every morning.  60 tablet  3  . diphenoxylate-atropine (LOMOTIL) 2.5-0.025 MG per tablet TAKE 1 TABLET 4 TIMES A DAY AS NEEDED FOR DIARRHEA.  30 tablet  0  . feeding supplement (ENSURE COMPLETE) LIQD Take 237 mLs by mouth 2 (two) times daily between meals.      . Fluticasone Furoate-Vilanterol (BREO ELLIPTA) 100-25 MCG/INH AEPB Inhale 1 puff into the lungs daily.  28 each  11  . hydrALAZINE (APRESOLINE) 25 MG tablet Take 1 tablet (25 mg total) by mouth 3 (three) times daily.  90 tablet  11  . levofloxacin (LEVAQUIN) 750 MG tablet Take 1 tablet (750 mg total) by mouth daily.  5 tablet  0  . levothyroxine (SYNTHROID, LEVOTHROID) 50 MCG tablet Take 1 tablet (50 mcg total) by mouth daily.  30 tablet  11  . lisinopril-hydrochlorothiazide (ZESTORETIC) 20-12.5 MG per  tablet Take 1 tablet by mouth daily.  30 tablet  11  . ondansetron (ZOFRAN) 4 MG tablet Take 1 tablet (4 mg total) by mouth every 8 (eight) hours as needed for nausea.  20 tablet  2  . predniSONE (DELTASONE) 10 MG tablet Take 3 tablets (30 mg total) by mouth as directed. See d/c instructions for taper schedule  32 tablet  0  . sodium chloride (OCEAN) 0.65 % SOLN nasal spray Place 1 spray into the nose as needed for congestion.      Marland Kitchen tiotropium (SPIRIVA HANDIHALER) 18 MCG inhalation capsule Place 1 capsule (18 mcg total) into inhaler and inhale daily.  30 capsule  12  . verapamil (COVERA HS) 240 MG (CO) 24 hr tablet Take 1 tablet (240 mg total) by mouth every morning.  30 tablet  5   No current facility-administered medications on file prior to visit.      Review of Systems System review is negative for any constitutional, cardiac, pulmonary, GI or neuro symptoms or complaints other than as described in the HPI.     Objective:   Physical Exam Filed Vitals:   01/02/13 1437  BP: 168/88  Pulse: 65  Temp: 98.3 F (36.8 C)  O2 sat 95% on 2L Gen'l - chronically ill and disheveled appearing woman  HEENT- [place]/AT, C&S clear Cor 2+ radial, RRR Pulm - using oxygen. No increased WOB, no rales Neuro - A&O x3 Derm- 5 cm diameter pressure sore left distal LE        Assessment & Plan:  Wound care - placed Unna boot on left LE for pressure ulcer Plan HH RN to change weekly

## 2013-01-05 NOTE — Assessment & Plan Note (Signed)
COPD/emphysema - progressive disease and we want to help you do as good as is possible. Plan Oxygen 24/7  Don't SMOKE  Breo Ellipta One inhalation daily. This is a MAINTENANCE medication to keep you out of trouble  Spirva Once a day. A maintenance medication  Albuterol nebulizer every 4 hours as needed for breakthrough breathing trouble. If you need to use it every day we need to adjust you maintenance medication. If you need to use it more than 3 times a day you need to BE SEEN.

## 2013-01-05 NOTE — Assessment & Plan Note (Signed)
Changing medication to Wellbutrin XL 300 mg qAM

## 2013-01-05 NOTE — Assessment & Plan Note (Signed)
Continues to drink: 4-6 glasses of wine per day.  Plan Encouraged to reduce intake to 2 glasses of wine per day.

## 2013-01-05 NOTE — Assessment & Plan Note (Signed)
BP Readings from Last 3 Encounters:  01/02/13 168/88  07/09/12 133/65  05/15/12 120/72   Reasonable control when she takes all her medications.

## 2013-01-07 ENCOUNTER — Encounter: Payer: Self-pay | Admitting: Internal Medicine

## 2013-01-10 ENCOUNTER — Telehealth: Payer: Self-pay | Admitting: *Deleted

## 2013-01-10 NOTE — Telephone Encounter (Signed)
Pt called requesting status of HH referral.  Please advise pt

## 2013-01-19 ENCOUNTER — Other Ambulatory Visit: Payer: Self-pay | Admitting: Internal Medicine

## 2013-03-22 ENCOUNTER — Other Ambulatory Visit: Payer: Self-pay | Admitting: Critical Care Medicine

## 2013-03-29 ENCOUNTER — Other Ambulatory Visit: Payer: Self-pay | Admitting: Internal Medicine

## 2013-04-18 ENCOUNTER — Other Ambulatory Visit: Payer: Self-pay | Admitting: Critical Care Medicine

## 2013-04-19 ENCOUNTER — Other Ambulatory Visit: Payer: Self-pay | Admitting: *Deleted

## 2013-04-19 NOTE — Telephone Encounter (Signed)
Pt previously reports she is now home bound and unable to come in to see PW right now d/t this.  PW ok with rx.  Rx sent.

## 2013-04-19 NOTE — Telephone Encounter (Signed)
Error

## 2013-05-07 ENCOUNTER — Other Ambulatory Visit: Payer: Self-pay | Admitting: Internal Medicine

## 2013-07-17 ENCOUNTER — Encounter: Payer: Self-pay | Admitting: *Deleted

## 2013-07-18 ENCOUNTER — Ambulatory Visit: Payer: Medicare Other | Admitting: Cardiology

## 2013-07-18 ENCOUNTER — Emergency Department (HOSPITAL_COMMUNITY): Payer: Medicare Other

## 2013-07-18 ENCOUNTER — Observation Stay (HOSPITAL_COMMUNITY): Payer: Medicare Other

## 2013-07-18 ENCOUNTER — Inpatient Hospital Stay (HOSPITAL_COMMUNITY)
Admission: EM | Admit: 2013-07-18 | Discharge: 2013-07-29 | DRG: 682 | Disposition: A | Discharge status: Skilled Nursing Facility | Payer: Medicare Other | Attending: Internal Medicine | Admitting: Internal Medicine

## 2013-07-18 ENCOUNTER — Encounter (HOSPITAL_COMMUNITY): Payer: Self-pay | Admitting: Emergency Medicine

## 2013-07-18 DIAGNOSIS — Z853 Personal history of malignant neoplasm of breast: Secondary | ICD-10-CM

## 2013-07-18 DIAGNOSIS — F4323 Adjustment disorder with mixed anxiety and depressed mood: Secondary | ICD-10-CM

## 2013-07-18 DIAGNOSIS — R5381 Other malaise: Secondary | ICD-10-CM

## 2013-07-18 DIAGNOSIS — J9620 Acute and chronic respiratory failure, unspecified whether with hypoxia or hypercapnia: Secondary | ICD-10-CM | POA: Diagnosis present

## 2013-07-18 DIAGNOSIS — M1990 Unspecified osteoarthritis, unspecified site: Secondary | ICD-10-CM | POA: Diagnosis present

## 2013-07-18 DIAGNOSIS — Z803 Family history of malignant neoplasm of breast: Secondary | ICD-10-CM

## 2013-07-18 DIAGNOSIS — Z9010 Acquired absence of unspecified breast and nipple: Secondary | ICD-10-CM

## 2013-07-18 DIAGNOSIS — L97509 Non-pressure chronic ulcer of other part of unspecified foot with unspecified severity: Secondary | ICD-10-CM | POA: Diagnosis present

## 2013-07-18 DIAGNOSIS — IMO0002 Reserved for concepts with insufficient information to code with codable children: Secondary | ICD-10-CM | POA: Diagnosis present

## 2013-07-18 DIAGNOSIS — I509 Heart failure, unspecified: Secondary | ICD-10-CM | POA: Diagnosis not present

## 2013-07-18 DIAGNOSIS — E785 Hyperlipidemia, unspecified: Secondary | ICD-10-CM | POA: Diagnosis present

## 2013-07-18 DIAGNOSIS — J441 Chronic obstructive pulmonary disease with (acute) exacerbation: Secondary | ICD-10-CM

## 2013-07-18 DIAGNOSIS — I130 Hypertensive heart and chronic kidney disease with heart failure and stage 1 through stage 4 chronic kidney disease, or unspecified chronic kidney disease: Secondary | ICD-10-CM | POA: Diagnosis present

## 2013-07-18 DIAGNOSIS — Z833 Family history of diabetes mellitus: Secondary | ICD-10-CM

## 2013-07-18 DIAGNOSIS — J449 Chronic obstructive pulmonary disease, unspecified: Secondary | ICD-10-CM

## 2013-07-18 DIAGNOSIS — E079 Disorder of thyroid, unspecified: Secondary | ICD-10-CM | POA: Diagnosis present

## 2013-07-18 DIAGNOSIS — R112 Nausea with vomiting, unspecified: Secondary | ICD-10-CM | POA: Diagnosis present

## 2013-07-18 DIAGNOSIS — K5289 Other specified noninfective gastroenteritis and colitis: Secondary | ICD-10-CM | POA: Diagnosis present

## 2013-07-18 DIAGNOSIS — Z79899 Other long term (current) drug therapy: Secondary | ICD-10-CM

## 2013-07-18 DIAGNOSIS — J690 Pneumonitis due to inhalation of food and vomit: Secondary | ICD-10-CM | POA: Diagnosis not present

## 2013-07-18 DIAGNOSIS — N179 Acute kidney failure, unspecified: Principal | ICD-10-CM | POA: Diagnosis present

## 2013-07-18 DIAGNOSIS — I119 Hypertensive heart disease without heart failure: Secondary | ICD-10-CM

## 2013-07-18 DIAGNOSIS — E538 Deficiency of other specified B group vitamins: Secondary | ICD-10-CM

## 2013-07-18 DIAGNOSIS — Z8249 Family history of ischemic heart disease and other diseases of the circulatory system: Secondary | ICD-10-CM

## 2013-07-18 DIAGNOSIS — I5031 Acute diastolic (congestive) heart failure: Secondary | ICD-10-CM

## 2013-07-18 DIAGNOSIS — J439 Emphysema, unspecified: Secondary | ICD-10-CM | POA: Diagnosis present

## 2013-07-18 DIAGNOSIS — E039 Hypothyroidism, unspecified: Secondary | ICD-10-CM | POA: Diagnosis present

## 2013-07-18 DIAGNOSIS — Z9981 Dependence on supplemental oxygen: Secondary | ICD-10-CM

## 2013-07-18 DIAGNOSIS — F17200 Nicotine dependence, unspecified, uncomplicated: Secondary | ICD-10-CM

## 2013-07-18 DIAGNOSIS — N390 Urinary tract infection, site not specified: Secondary | ICD-10-CM

## 2013-07-18 DIAGNOSIS — N183 Chronic kidney disease, stage 3 unspecified: Secondary | ICD-10-CM | POA: Diagnosis present

## 2013-07-18 DIAGNOSIS — I739 Peripheral vascular disease, unspecified: Secondary | ICD-10-CM | POA: Diagnosis present

## 2013-07-18 DIAGNOSIS — N039 Chronic nephritic syndrome with unspecified morphologic changes: Secondary | ICD-10-CM

## 2013-07-18 DIAGNOSIS — E8779 Other fluid overload: Secondary | ICD-10-CM | POA: Diagnosis not present

## 2013-07-18 DIAGNOSIS — F1010 Alcohol abuse, uncomplicated: Secondary | ICD-10-CM

## 2013-07-18 DIAGNOSIS — I10 Essential (primary) hypertension: Secondary | ICD-10-CM | POA: Diagnosis present

## 2013-07-18 LAB — COMPREHENSIVE METABOLIC PANEL
ALK PHOS: 86 U/L (ref 39–117)
ALT: 14 U/L (ref 0–35)
AST: 20 U/L (ref 0–37)
Albumin: 4.1 g/dL (ref 3.5–5.2)
BILIRUBIN TOTAL: 1.5 mg/dL — AB (ref 0.3–1.2)
BUN: 16 mg/dL (ref 6–23)
CHLORIDE: 98 meq/L (ref 96–112)
CO2: 31 mEq/L (ref 19–32)
Calcium: 9.5 mg/dL (ref 8.4–10.5)
Creatinine, Ser: 0.94 mg/dL (ref 0.50–1.10)
GFR calc Af Amer: 64 mL/min — ABNORMAL LOW (ref >90)
GFR calc non Af Amer: 55 mL/min — ABNORMAL LOW (ref >90)
Glucose, Bld: 133 mg/dL — ABNORMAL HIGH (ref 70–99)
POTASSIUM: 4.2 meq/L (ref 3.7–5.3)
Sodium: 143 mEq/L (ref 137–147)
Total Protein: 7.7 g/dL (ref 6.0–8.3)

## 2013-07-18 LAB — CBC WITH DIFFERENTIAL/PLATELET
Basophils Absolute: 0 10*3/uL (ref 0.0–0.1)
Basophils Relative: 0 % (ref 0–1)
EOS ABS: 0.1 10*3/uL (ref 0.0–0.7)
Eosinophils Relative: 1 % (ref 0–5)
HCT: 44.4 % (ref 36.0–46.0)
Hemoglobin: 14.8 g/dL (ref 12.0–15.0)
LYMPHS ABS: 1.1 10*3/uL (ref 0.7–4.0)
LYMPHS PCT: 9 % — AB (ref 12–46)
MCH: 31 pg (ref 26.0–34.0)
MCHC: 33.3 g/dL (ref 30.0–36.0)
MCV: 93.1 fL (ref 78.0–100.0)
MONO ABS: 0.7 10*3/uL (ref 0.1–1.0)
Monocytes Relative: 5 % (ref 3–12)
Neutro Abs: 10.2 10*3/uL — ABNORMAL HIGH (ref 1.7–7.7)
Neutrophils Relative %: 85 % — ABNORMAL HIGH (ref 43–77)
PLATELETS: 253 10*3/uL (ref 150–400)
RBC: 4.77 MIL/uL (ref 3.87–5.11)
RDW: 13.5 % (ref 11.5–15.5)
WBC: 12.1 10*3/uL — AB (ref 4.0–10.5)

## 2013-07-18 LAB — URINALYSIS, ROUTINE W REFLEX MICROSCOPIC
Bilirubin Urine: NEGATIVE
Glucose, UA: NEGATIVE mg/dL
KETONES UR: 15 mg/dL — AB
NITRITE: NEGATIVE
PROTEIN: 100 mg/dL — AB
Specific Gravity, Urine: 1.02 (ref 1.005–1.030)
Urobilinogen, UA: 1 mg/dL (ref 0.0–1.0)
pH: 7 (ref 5.0–8.0)

## 2013-07-18 LAB — LIPASE, BLOOD: LIPASE: 30 U/L (ref 11–59)

## 2013-07-18 LAB — TROPONIN I: Troponin I: <0.3 ng/mL (ref <0.30)

## 2013-07-18 LAB — URINE MICROSCOPIC-ADD ON

## 2013-07-18 MED ORDER — SODIUM CHLORIDE 0.9 % IV SOLN
INTRAVENOUS | Status: AC
  Administered 2013-07-18: 22:00:00 via INTRAVENOUS

## 2013-07-18 MED ORDER — ENOXAPARIN SODIUM 40 MG/0.4ML ~~LOC~~ SOLN
40.0000 mg | SUBCUTANEOUS | Status: DC
  Administered 2013-07-18: 40 mg via SUBCUTANEOUS
  Filled 2013-07-18 (×2): qty 0.4

## 2013-07-18 MED ORDER — CLONIDINE HCL 0.1 MG/24HR TD PTWK
0.1000 mg | MEDICATED_PATCH | TRANSDERMAL | Status: DC
  Administered 2013-07-18: 0.1 mg via TRANSDERMAL
  Filled 2013-07-18: qty 1

## 2013-07-18 MED ORDER — LORAZEPAM 2 MG/ML IJ SOLN
0.0000 mg | Freq: Two times a day (BID) | INTRAMUSCULAR | Status: AC
  Administered 2013-07-20 – 2013-07-22 (×3): 1 mg via INTRAVENOUS
  Filled 2013-07-18 (×3): qty 1

## 2013-07-18 MED ORDER — ADULT MULTIVITAMIN W/MINERALS CH
1.0000 | ORAL_TABLET | Freq: Every day | ORAL | Status: DC
  Administered 2013-07-19 – 2013-07-29 (×11): 1 via ORAL
  Filled 2013-07-18 (×11): qty 1

## 2013-07-18 MED ORDER — IPRATROPIUM BROMIDE 0.02 % IN SOLN
0.5000 mg | Freq: Once | RESPIRATORY_TRACT | Status: AC
  Administered 2013-07-18: 0.5 mg via RESPIRATORY_TRACT
  Filled 2013-07-18: qty 2.5

## 2013-07-18 MED ORDER — BUPROPION HCL ER (XL) 300 MG PO TB24
300.0000 mg | ORAL_TABLET | Freq: Every day | ORAL | Status: DC
  Administered 2013-07-19 – 2013-07-29 (×11): 300 mg via ORAL
  Filled 2013-07-18 (×11): qty 1

## 2013-07-18 MED ORDER — CLONIDINE HCL 0.1 MG PO TABS
0.1000 mg | ORAL_TABLET | Freq: Once | ORAL | Status: AC
  Administered 2013-07-18: 0.1 mg via ORAL
  Filled 2013-07-18: qty 1

## 2013-07-18 MED ORDER — ACETAMINOPHEN 325 MG PO TABS
650.0000 mg | ORAL_TABLET | Freq: Four times a day (QID) | ORAL | Status: DC | PRN
  Administered 2013-07-24: 650 mg via ORAL
  Filled 2013-07-18: qty 2

## 2013-07-18 MED ORDER — SODIUM CHLORIDE 0.9 % IV SOLN: INTRAVENOUS | Status: DC

## 2013-07-18 MED ORDER — VITAMIN B-1 100 MG PO TABS
100.0000 mg | ORAL_TABLET | Freq: Every day | ORAL | Status: DC
  Administered 2013-07-19 – 2013-07-29 (×10): 100 mg via ORAL
  Filled 2013-07-18 (×11): qty 1

## 2013-07-18 MED ORDER — LORAZEPAM 2 MG/ML IJ SOLN
1.0000 mg | Freq: Four times a day (QID) | INTRAMUSCULAR | Status: AC | PRN
  Administered 2013-07-21: 1 mg via INTRAVENOUS
  Filled 2013-07-18: qty 1

## 2013-07-18 MED ORDER — ONDANSETRON HCL 4 MG/2ML IJ SOLN: 4.0000 mg | Freq: Three times a day (TID) | INTRAMUSCULAR | Status: DC | PRN

## 2013-07-18 MED ORDER — MORPHINE SULFATE 2 MG/ML IJ SOLN
1.0000 mg | INTRAMUSCULAR | Status: DC | PRN
  Administered 2013-07-19 – 2013-07-24 (×5): 1 mg via INTRAVENOUS
  Filled 2013-07-18 (×5): qty 1

## 2013-07-18 MED ORDER — LORAZEPAM 1 MG PO TABS
1.0000 mg | ORAL_TABLET | Freq: Four times a day (QID) | ORAL | Status: AC | PRN
  Administered 2013-07-20 – 2013-07-21 (×3): 1 mg via ORAL
  Filled 2013-07-18 (×3): qty 1

## 2013-07-18 MED ORDER — DEXTROSE 5 % IV SOLN
1.0000 g | INTRAVENOUS | Status: DC
  Administered 2013-07-18 – 2013-07-19 (×2): 1 g via INTRAVENOUS
  Filled 2013-07-18 (×3): qty 10

## 2013-07-18 MED ORDER — SODIUM CHLORIDE 0.9 % IV BOLUS (SEPSIS)
500.0000 mL | Freq: Once | INTRAVENOUS | Status: AC
  Administered 2013-07-18: 500 mL via INTRAVENOUS

## 2013-07-18 MED ORDER — ALBUTEROL SULFATE (2.5 MG/3ML) 0.083% IN NEBU: 5.0000 mg | INHALATION_SOLUTION | RESPIRATORY_TRACT | Status: AC | PRN

## 2013-07-18 MED ORDER — CEPHALEXIN 500 MG PO CAPS: 500.0000 mg | ORAL_CAPSULE | Freq: Four times a day (QID) | ORAL | Status: DC

## 2013-07-18 MED ORDER — LORAZEPAM 2 MG/ML IJ SOLN
0.0000 mg | Freq: Four times a day (QID) | INTRAMUSCULAR | Status: AC
  Administered 2013-07-18: 2 mg via INTRAVENOUS
  Filled 2013-07-18: qty 1

## 2013-07-18 MED ORDER — LEVOTHYROXINE SODIUM 50 MCG PO TABS
50.0000 ug | ORAL_TABLET | Freq: Every day | ORAL | Status: DC
  Administered 2013-07-19 – 2013-07-29 (×11): 50 ug via ORAL
  Filled 2013-07-18 (×12): qty 1

## 2013-07-18 MED ORDER — HYDRALAZINE HCL 20 MG/ML IJ SOLN
10.0000 mg | INTRAMUSCULAR | Status: DC | PRN
  Administered 2013-07-18 – 2013-07-29 (×9): 10 mg via INTRAVENOUS
  Filled 2013-07-18 (×2): qty 1
  Filled 2013-07-18 (×2): qty 0.5
  Filled 2013-07-18 (×4): qty 1

## 2013-07-18 MED ORDER — HYDRALAZINE HCL 20 MG/ML IJ SOLN
10.0000 mg | Freq: Once | INTRAMUSCULAR | Status: AC
  Administered 2013-07-18: 10 mg via INTRAVENOUS
  Filled 2013-07-18: qty 0.5

## 2013-07-18 MED ORDER — ALBUTEROL SULFATE (2.5 MG/3ML) 0.083% IN NEBU
5.0000 mg | INHALATION_SOLUTION | Freq: Once | RESPIRATORY_TRACT | Status: AC
  Administered 2013-07-18: 5 mg via RESPIRATORY_TRACT
  Filled 2013-07-18: qty 6

## 2013-07-18 MED ORDER — SIMVASTATIN 20 MG PO TABS: 20.0000 mg | ORAL_TABLET | Freq: Every day | ORAL | Status: DC

## 2013-07-18 MED ORDER — ONDANSETRON HCL 4 MG PO TABS
4.0000 mg | ORAL_TABLET | Freq: Four times a day (QID) | ORAL | Status: DC | PRN
  Administered 2013-07-24: 4 mg via ORAL
  Filled 2013-07-18: qty 1

## 2013-07-18 MED ORDER — LOVASTATIN 20 MG PO TABS
40.0000 mg | ORAL_TABLET | Freq: Every day | ORAL | Status: DC
  Administered 2013-07-19 – 2013-07-28 (×10): 40 mg via ORAL
  Filled 2013-07-18 (×14): qty 2

## 2013-07-18 MED ORDER — FOLIC ACID 1 MG PO TABS
1.0000 mg | ORAL_TABLET | Freq: Every day | ORAL | Status: DC
  Administered 2013-07-19 – 2013-07-29 (×11): 1 mg via ORAL
  Filled 2013-07-18 (×11): qty 1

## 2013-07-18 MED ORDER — METOCLOPRAMIDE HCL 5 MG/ML IJ SOLN
10.0000 mg | Freq: Once | INTRAMUSCULAR | Status: AC
  Administered 2013-07-18: 10 mg via INTRAVENOUS
  Filled 2013-07-18: qty 2

## 2013-07-18 MED ORDER — ONDANSETRON HCL 8 MG PO TABS: 8.0000 mg | ORAL_TABLET | Freq: Three times a day (TID) | ORAL | Status: AC | PRN

## 2013-07-18 MED ORDER — ONDANSETRON HCL 4 MG/2ML IJ SOLN: 4.0000 mg | Freq: Four times a day (QID) | INTRAMUSCULAR | Status: DC | PRN

## 2013-07-18 MED ORDER — TIOTROPIUM BROMIDE MONOHYDRATE 18 MCG IN CAPS
18.0000 ug | ORAL_CAPSULE | Freq: Every day | RESPIRATORY_TRACT | Status: DC
  Administered 2013-07-19 – 2013-07-23 (×4): 18 ug via RESPIRATORY_TRACT
  Filled 2013-07-18: qty 5

## 2013-07-18 MED ORDER — HYDROMORPHONE HCL PF 1 MG/ML IJ SOLN
1.0000 mg | Freq: Once | INTRAMUSCULAR | Status: AC
  Administered 2013-07-18: 1 mg via INTRAVENOUS
  Filled 2013-07-18: qty 1

## 2013-07-18 MED ORDER — ONDANSETRON HCL 4 MG/2ML IJ SOLN
4.0000 mg | Freq: Once | INTRAMUSCULAR | Status: AC
  Administered 2013-07-18: 4 mg via INTRAVENOUS
  Filled 2013-07-18: qty 2

## 2013-07-18 MED ORDER — ALBUTEROL SULFATE (2.5 MG/3ML) 0.083% IN NEBU
2.5000 mg | INHALATION_SOLUTION | Freq: Four times a day (QID) | RESPIRATORY_TRACT | Status: DC | PRN
  Administered 2013-07-19 – 2013-07-22 (×7): 2.5 mg via RESPIRATORY_TRACT
  Filled 2013-07-18 (×8): qty 3

## 2013-07-18 MED ORDER — ACETAMINOPHEN 650 MG RE SUPP: 650.0000 mg | Freq: Four times a day (QID) | RECTAL | Status: DC | PRN

## 2013-07-18 MED ORDER — VERAPAMIL HCL 120 MG PO TABS
240.0000 mg | ORAL_TABLET | Freq: Two times a day (BID) | ORAL | Status: DC
  Administered 2013-07-18 – 2013-07-29 (×21): 240 mg via ORAL
  Filled 2013-07-18 (×24): qty 2

## 2013-07-18 MED ORDER — THIAMINE HCL 100 MG/ML IJ SOLN
100.0000 mg | Freq: Every day | INTRAMUSCULAR | Status: DC
  Administered 2013-07-26: 100 mg via INTRAVENOUS
  Filled 2013-07-18 (×11): qty 1

## 2013-07-18 MED ORDER — ALBUTEROL SULFATE HFA 108 (90 BASE) MCG/ACT IN AERS: 1.0000 | INHALATION_SPRAY | Freq: Four times a day (QID) | RESPIRATORY_TRACT | Status: DC | PRN

## 2013-07-18 NOTE — ED Notes (Signed)
Wounds on bil feet cleaned.  Antibiotic applied and new dressings applied.  Pt has 3 toes with wounds/blisters noted.  Skin was very dirty, dry.  Toe nails long, distended and extremely tough.

## 2013-07-18 NOTE — ED Notes (Signed)
Notified MD about delay.  MD aware pt concerned about delay.

## 2013-07-18 NOTE — H&P (Signed)
[facility] History and Physical  TERI LEGACY CWC:[id number] DOB: [birth year] DOA: 07/18/2013  Referring physician: ER physician. PCP: [name], MD   Chief Complaint: Nausea vomiting.  HPI: Renee Donovan is a 80 y.o. female with history of COPD, hypothyroidism, chronic alcoholism, tobacco abuse, history of breast cancer, hypertension started experiencing nausea vomiting since morning. Patient's nausea vomiting or persistent and unable to keep in anything. Eventually patient started developing right upper quadrant flank pain. Denies any diarrhea. Denies any chest pain or shortness of breath. Patient has been noticing some ulcers in her toes in both feet. In the ER labs reveal possible UTI. Since patient had some right flank pain and right upper quadrant pain CT abdomen and pelvis has been ordered which is pending. Patient's blood pressures found to be elevated for which hydralazine IV was ordered. Patient will be admitted for further workup. Denies any headache or focal deficits.  Review of Systems: As presented in the history of presenting illness, rest negative.  Past Medical History  Diagnosis Date  . Hyperlipidemia   . Hypertension   . DDD (degenerative disc disease)   . DJD (degenerative joint disease)     ankles  . Cluster headache     remote  . COPD (chronic obstructive pulmonary disease)     Golds Stage II feV1 63% 11/2009  . Thyroid disorder   . Breast cancer 1991    s/p R lumpectomy and radiation    Past Surgical History  Procedure Laterality Date  . Nose surgery  1993  . Chin lift  [id number]  . Toe surgery  1965    5th toe- right foot  . Breast lumpectomy  1991    Right breast   Social History:  reports that she has been smoking Cigarettes.  She has a 30 pack-year smoking history. She has never used smokeless tobacco. She reports that she drinks alcohol. She reports that she does not use illicit drugs. Where does patient live home. Can patient  participate in ADLs? Yes.  No Known Allergies  Family History:  Family History  Problem Relation Age of Onset  . Coronary artery disease Father   . Heart attack Father   . Diabetes Father   . Breast cancer Mother       Prior to Admission medications   Medication Sig Start Date End Date Taking? Authorizing Provider  albuterol (PROVENTIL HFA;VENTOLIN HFA) 108 (90 BASE) MCG/ACT inhaler Inhale 1-2 puffs into the lungs every 6 (six) hours as needed for wheezing or shortness of breath.   Yes Historical Provider, MD  albuterol (PROVENTIL) (2.5 MG/3ML) 0.083% nebulizer solution Take 2.5 mg by nebulization every 6 (six) hours as needed for wheezing or shortness of breath.   Yes Historical Provider, MD  buPROPion (WELLBUTRIN XL) 300 MG 24 hr tablet Take 1 tablet (300 mg total) by mouth daily. 01/02/13  Yes Neena Rhymes, MD  cephALEXin (KEFLEX) 500 MG capsule Take 500 mg by mouth 4 (four) times daily.   Yes Historical Provider, MD  cloNIDine (CATAPRES) 0.1 MG tablet Take 0.1 mg by mouth 2 (two) times daily. 01/02/13  Yes Neena Rhymes, MD  levothyroxine (SYNTHROID, LEVOTHROID) 50 MCG tablet Take 1 tablet (50 mcg total) by mouth daily. 07/09/12  Yes Neena Rhymes, MD  lisinopril-hydrochlorothiazide (ZESTORETIC) 20-12.5 MG per tablet Take 1 tablet by mouth daily. 07/09/12  Yes Neena Rhymes, MD  lovastatin (MEVACOR) 40 MG tablet Take 40 mg by mouth at bedtime.   Yes Historical  Provider, MD  tiotropium (SPIRIVA HANDIHALER) 18 MCG inhalation capsule Place 1 capsule (18 mcg total) into inhaler and inhale daily. 01/02/13  Yes Neena Rhymes, MD  verapamil (CALAN) 120 MG tablet Take 240 mg by mouth 2 (two) times daily.   Yes Historical Provider, MD  cephALEXin (KEFLEX) 500 MG capsule Take 1 capsule (500 mg total) by mouth 4 (four) times daily. 07/18/13   Mirna Mires, MD  ondansetron (ZOFRAN) 8 MG tablet Take 1 tablet (8 mg total) by mouth every 8 (eight) hours as needed for nausea or vomiting.  07/18/13   Mirna Mires, MD    Physical Exam: Filed Vitals:   07/18/13 1745 07/18/13 1800 07/18/13 1900 07/18/13 2045  BP: 183/105 206/75 205/69 194/64  Pulse: 75 81 79 72  Temp:      TempSrc:      Resp: 28 28 23 23   SpO2: 97% 95% 94% 97%     General:  Well developed and moderately nourished.  Eyes: Anicteric no pallor.  ENT: No discharge from the ears eyes nose mouth.  Neck: No mass felt.  Cardiovascular: S1-S2 heard.  Respiratory: No rhonchi or crepitations.  Abdomen: Soft nontender bowel sounds present no guarding or rigidity.  Skin: Ulcers in the left fourth toe and right second and third toe.  Musculoskeletal: As explained in the skin section.  Psychiatric: Appears normal.  Neurologic: Alert awake oriented to time place and person. Moves all extremities.  Labs on Admission:  Basic Metabolic Panel:  Recent Labs Lab 07/18/13 1322  NA 143  K 4.2  CL 98  CO2 31  GLUCOSE 133*  BUN 16  CREATININE 0.94  CALCIUM 9.5   Liver Function Tests:  Recent Labs Lab 07/18/13 1322  AST 20  ALT 14  ALKPHOS 86  BILITOT 1.5*  PROT 7.7  ALBUMIN 4.1    Recent Labs Lab 07/18/13 1322  LIPASE 30   No results found for this basename: AMMONIA,  in the last 168 hours CBC:  Recent Labs Lab 07/18/13 1322  WBC 12.1*  NEUTROABS 10.2*  HGB 14.8  HCT 44.4  MCV 93.1  PLT 253   Cardiac Enzymes:  Recent Labs Lab 07/18/13 1322  TROPONINI <0.30    BNP (last 3 results) No results found for this basename: PROBNP,  in the last 8760 hours CBG: No results found for this basename: GLUCAP,  in the last 168 hours  Radiological Exams on Admission: Dg Abd Acute W/chest  07/18/2013   CLINICAL DATA:  pain  EXAM: ACUTE ABDOMEN SERIES (ABDOMEN 2 VIEW & CHEST 1 VIEW)  COMPARISON:  DG CHEST 1V PORT dated 07/07/2012  FINDINGS: There is no evidence of dilated bowel loops or free intraperitoneal air. No radiopaque calculi or other significant radiographic abnormality is  seen. Heart size and mediastinal contours are within normal limits. The lungs are hyperinflated and there is flattening of the hemidiaphragms. Lungs otherwise clear. Postsurgical changes in the right axilla.  IMPRESSION: Negative abdominal radiographs. No acute cardiopulmonary disease COPD.   Electronically Signed   By: Margaree Mackintosh M.D.   On: 07/18/2013 16:45     Assessment/Plan Principal Problem:   Nausea and vomiting Active Problems:   Hypothyroidism   COPD (chronic obstructive pulmonary disease) with emphysema   Toe ulcer   Accelerated hypertension   Nausea & vomiting   1. Persistent nausea vomiting - cause not clear. Since patient has right upper quadrant and flank pain CT abdomen was ordered to rule out any hydronephrosis.  Patient does have features concerning for UTI which could be contributing to nausea vomiting. Check urine drug screen for marijuana which can also cause nausea vomiting. For now I have patient n.p.o. except medications and gentle hydration. 2. Accelerated hypertension - probably caused by nausea vomiting. I have placed patient on clonidine patch as patient still has nausea vomiting and clonidine can be changed to oral clonidine once patient can take orally. I have also place patient on when necessary IV hydralazine for systolic blood pressure more than 160. Continue verapamil. 3. Ulcers on toes on both lower extremities - wound consult requested. Check sedimentation rate and if elevated check was mellitus. X-rays of the feet are pending. 4. UTI - on antibiotics. Follow cultures. 5. History of alcoholism - on alcohol withdrawal protocol. Social work consult. 6. Tobacco abuse - advised to quit tobacco use. 7. Hypothyroidism - continue Synthroid.    Code Status: Full code.  Family Communication: Patient's daughter at the bedside.  Disposition Plan: Admit to inpatient.    [facility] Pager [phone].  If 7PM-7AM, please contact  night-coverage [email or web address] Password [facility] 07/18/2013, 9:28 PM

## 2013-07-18 NOTE — Progress Notes (Signed)
ANTIBIOTIC CONSULT NOTE - INITIAL  Pharmacy Consult for Rocephin Indication: UTI  No Known Allergies  Patient Measurements: Height: 5\' 6"  (167.6 cm) Weight: 169 lb 14.4 oz (77.066 kg) IBW/kg (Calculated) : 59.3  Vital Signs: Temp: 98.6 F (37 C) (04/23 2220) Temp src: Oral (04/23 2220) BP: 152/66 mmHg (04/23 2220) Pulse Rate: 54 (04/23 2220) Intake/Output from previous day:   Intake/Output from this shift:    Labs:  Recent Labs  07/18/13 1322  WBC 12.1*  HGB 14.8  PLT 253  CREATININE 0.94   Estimated Creatinine Clearance: 49.2 ml/min (by C-G formula based on Cr of 0.94). No results found for this basename: VANCOTROUGH, VANCOPEAK, VANCORANDOM, GENTTROUGH, GENTPEAK, GENTRANDOM, TOBRATROUGH, TOBRAPEAK, TOBRARND, AMIKACINPEAK, AMIKACINTROU, AMIKACIN,  in the last 72 hours   Microbiology: No results found for this or any previous visit (from the past 720 hour(s)).  Medical History: Past Medical History  Diagnosis Date  . Hyperlipidemia   . Hypertension   . DDD (degenerative disc disease)   . DJD (degenerative joint disease)     ankles  . Cluster headache     remote  . COPD (chronic obstructive pulmonary disease)     Golds Stage II feV1 63% 11/2009  . Thyroid disorder   . Breast cancer 1991    s/p R lumpectomy and radiation     Medications:  Scheduled:  . [START ON 07/19/2013] buPROPion  300 mg Oral Daily  . cefTRIAXone (ROCEPHIN)  IV  1 g Intravenous Q24H  . cloNIDine  0.1 mg Transdermal Weekly  . enoxaparin (LOVENOX) injection  40 mg Subcutaneous Q24H  . [START ON 06/21/7122] folic acid  1 mg Oral Daily  . [START ON 07/19/2013] levothyroxine  50 mcg Oral QAC breakfast  . LORazepam  0-4 mg Intravenous Q6H   Followed by  . [START ON 07/20/2013] LORazepam  0-4 mg Intravenous Q12H  . [START ON 07/19/2013] multivitamin with minerals  1 tablet Oral Daily  . [START ON 07/19/2013] simvastatin  20 mg Oral q1800  . [START ON 07/19/2013] thiamine  100 mg Oral Daily   Or   . [START ON 07/19/2013] thiamine  100 mg Intravenous Daily  . [START ON 07/19/2013] tiotropium  18 mcg Inhalation Daily  . verapamil  240 mg Oral BID   Infusions:  . sodium chloride 75 mL/hr at 07/18/13 2228   PRN: acetaminophen, acetaminophen, albuterol, albuterol, hydrALAZINE, LORazepam, LORazepam, morphine injection, ondansetron (ZOFRAN) IV, ondansetron  Assessment: 33 yof presented to ED with vomiting & weakness. Pharmacy consulted to assist in dosing rocephin for possible UTI   Goal of Therapy:  Eradication of Infection  Plan:  Continue with Rocephin 1g IV Q24H Follow up culture results  Julio Sicks 07/18/2013,10:28 PM

## 2013-07-18 NOTE — Progress Notes (Signed)
07/18/2013 A. Juma Oxley RNCM 2150pm [facility] text Erasmo Downer, transitional care specialist for [facility] of patient's admission.

## 2013-07-18 NOTE — Progress Notes (Addendum)
Clinical Social Work Department BRIEF PSYCHOSOCIAL ASSESSMENT 07/18/2013  Patient:  Renee Donovan, Renee Donovan     Account Number:  [id number]     Admit date:  07/18/2013  Clinical Social Worker:  Luretha Rued  Date/Time:  07/18/2013 09:00 PM  Referred by:  CSW  Date Referred:  07/18/2013  Other Referral:   Interview type:  Patient Other interview type:   Family was at bedside. John her grandson/POA and daughter n law    PSYCHOSOCIAL DATA Living Status:  ALONE Admitted from facility:   Level of care:   Primary support name:  Kimisha Eunice Primary support relationship to patient:  FAMILY Degree of support available:   High level of care    CURRENT CONCERNS  Other Concerns:    SOCIAL WORK ASSESSMENT / PLAN CSW met with patient and family at bedside to complete this assessment.  CSW was informed of concerns for the patient's safety in the home are no PCP, multiple falls, daily alcohol use, portable oxygen, smoker, poor hygiene, non-compliance with medications, and decline in medical health.  Patient was accompanied by her grandson/POA and daughter n law.  They are concerned with the patient's inability walk, complete ADLs, or maintain self in the home.  The family are interested in ALF placement but the patient is not at this time.  The family mentioned to the patient multiple time that she would need to stop the alcohol use in the home as this will increased the unsteady ambulation.  Patient is adamant that she will not stop drinking some she refused to discuss it with the family. CSW provided the family with the contact information for the [place]. CSW suggested an APS reports is completed and the family was in agreement.      CSW spoke with Linna Hoff Reckard to complete the APS report. He reports someone will follow up with the patient in the next 24 hours.   Assessment/plan status:   Other assessment/ plan:   Information/referral to community resources:   APS  report was completed and family plus patient is aware. Patient refuses any facility referrals.    PATIENT'S/FAMILY'S RESPONSE TO PLAN OF CARE: Patient and family expresses their appreciation of the support from the social work department.     Chesley Noon, MSW, [place], 07/18/2013 Evening Clinical Social Worker [phone]

## 2013-07-18 NOTE — ED Provider Notes (Addendum)
CSN: [id number]     Arrival date & time 07/18/13  1249 History   First MD Initiated Contact with Patient 07/18/13 1459     Chief Complaint  Patient presents with  . Emesis  . Weakness     (Consider location/radiation/quality/duration/timing/severity/associated sxs/prior Treatment) Patient is a 80 y.o. female presenting with vomiting and weakness. The history is provided by the patient.  Emesis Associated symptoms: no abdominal pain, no chills, no diarrhea, no headaches and no sore throat   Weakness Pertinent negatives include no chest pain, no abdominal pain, no headaches and no shortness of breath.  pt with hx copd, presents w nv in past day. States nausea constant, episodic dry heaves. Denies any bloody or bilious emesis. No abd distension. +crampy, diffuse abdominal discomfort, intermittent, without specific exacerbating or alleviating factors. Had bm today, no diarrhea. No dysuria or gu c/o. Denies cp. +non productive cough. States always sob w copd, on home o2, but states feels breathing at or near baseline. No fever or chills. States compliant w normal meds. Also states recent non healing foot ulcers, pt unsure when started, but states its been there for 'awhile'. States was set up to see sehv earlier today for vascular workup but was too sick to go. Denies claudication.      Past Medical History  Diagnosis Date  . Hyperlipidemia   . Hypertension   . DDD (degenerative disc disease)   . DJD (degenerative joint disease)     ankles  . Cluster headache     remote  . COPD (chronic obstructive pulmonary disease)     Golds Stage II feV1 63% 11/2009  . Thyroid disorder   . Breast cancer 1991    s/p R lumpectomy and radiation    Past Surgical History  Procedure Laterality Date  . Nose surgery  1993  . Chin lift  [id number]  . Toe surgery  1965    5th toe- right foot  . Breast lumpectomy  1991    Right breast   Family History  Problem Relation Age of Onset  . Coronary artery  disease Father   . Heart attack Father   . Diabetes Father   . Breast cancer Mother    History  Substance Use Topics  . Smoking status: Current Every Day Smoker -- 0.50 packs/day for 60 years    Types: Cigarettes  . Smokeless tobacco: Never Used     Comment: e cigarrette///02/08/12  . Alcohol Use: Yes     Comment: 4 glasses of wine daily   OB History   Grav Para Term Preterm Abortions TAB SAB Ect Mult Living                 Review of Systems  Constitutional: Negative for fever and chills.  HENT: Negative for sore throat.   Eyes: Negative for redness.  Respiratory: Positive for cough. Negative for shortness of breath.   Cardiovascular: Negative for chest pain and leg swelling.  Gastrointestinal: Positive for vomiting. Negative for abdominal pain, diarrhea and constipation.  Genitourinary: Negative for dysuria and flank pain.  Musculoskeletal: Negative for back pain and neck pain.  Skin: Negative for rash.  Neurological: Positive for weakness. Negative for headaches.  Hematological: Does not bruise/bleed easily.  Psychiatric/Behavioral: Negative for confusion.      Allergies  Review of patient's allergies indicates no known allergies.  Home Medications   Prior to Admission medications   Medication Sig Start Date End Date Taking? Authorizing Provider  albuterol (PROVENTIL HFA;VENTOLIN HFA)  108 (90 BASE) MCG/ACT inhaler Inhale 1-2 puffs into the lungs every 6 (six) hours as needed for wheezing or shortness of breath.   Yes Historical Provider, MD  albuterol (PROVENTIL) (2.5 MG/3ML) 0.083% nebulizer solution Take 2.5 mg by nebulization every 6 (six) hours as needed for wheezing or shortness of breath.   Yes Historical Provider, MD  buPROPion (WELLBUTRIN XL) 300 MG 24 hr tablet Take 1 tablet (300 mg total) by mouth daily. 01/02/13  Yes Neena Rhymes, MD  cephALEXin (KEFLEX) 500 MG capsule Take 500 mg by mouth 4 (four) times daily.   Yes Historical Provider, MD  cloNIDine  (CATAPRES) 0.1 MG tablet Take 0.1 mg by mouth 2 (two) times daily. 01/02/13  Yes Neena Rhymes, MD  levothyroxine (SYNTHROID, LEVOTHROID) 50 MCG tablet Take 1 tablet (50 mcg total) by mouth daily. 07/09/12  Yes Neena Rhymes, MD  lisinopril-hydrochlorothiazide (ZESTORETIC) 20-12.5 MG per tablet Take 1 tablet by mouth daily. 07/09/12  Yes Neena Rhymes, MD  lovastatin (MEVACOR) 40 MG tablet Take 40 mg by mouth at bedtime.   Yes Historical Provider, MD  tiotropium (SPIRIVA HANDIHALER) 18 MCG inhalation capsule Place 1 capsule (18 mcg total) into inhaler and inhale daily. 01/02/13  Yes Neena Rhymes, MD  verapamil (CALAN) 120 MG tablet Take 240 mg by mouth 2 (two) times daily.   Yes Historical Provider, MD   BP 201/78  Pulse 64  Temp(Src) 97.5 F (36.4 C) (Oral)  Resp 16  SpO2 96% Physical Exam  Nursing note and vitals reviewed. Constitutional: She is oriented to person, place, and time. She appears well-developed and well-nourished. No distress.  HENT:  Mouth/Throat: Oropharynx is clear and moist.  Eyes: Conjunctivae are normal. Pupils are equal, round, and reactive to light. No scleral icterus.  Neck: Normal range of motion. Neck supple. No tracheal deviation present.  Cardiovascular: Normal rate, regular rhythm, normal heart sounds and intact distal pulses.   Pulmonary/Chest: Effort normal. No respiratory distress. She has wheezes.  Abdominal: Soft. Normal appearance and bowel sounds are normal. She exhibits no distension and no mass. There is no tenderness. There is no rebound and no guarding.  Genitourinary:  No cva tenderness  Musculoskeletal: She exhibits no edema.  Ulcerations on dorsal aspect of multiple toes, superficial, w scaly, caked on exudate. Toes of feet extremely dirty, ?underlying discoloration of several toes. No gangrenous changes, no necrotic changes. Pt is able to move all toes, +cal refill distally. Dp/pt 2+ on left, 1+ on right, strong doppler signal bil.     Neurological: She is alert and oriented to person, place, and time.  Skin: Skin is warm and dry. No rash noted.  Psychiatric: She has a normal mood and affect.    ED Course  Procedures (including critical care time)  Results for orders placed during the hospital encounter of 07/18/13  COMPREHENSIVE METABOLIC PANEL      Result Value Ref Range   Sodium 143  137 - 147 mEq/L   Potassium 4.2  3.7 - 5.3 mEq/L   Chloride 98  96 - 112 mEq/L   CO2 31  19 - 32 mEq/L   Glucose, Bld 133 (*) 70 - 99 mg/dL   BUN 16  6 - 23 mg/dL   Creatinine, Ser 0.94  0.50 - 1.10 mg/dL   Calcium 9.5  8.4 - 10.5 mg/dL   Total Protein 7.7  6.0 - 8.3 g/dL   Albumin 4.1  3.5 - 5.2 g/dL   AST 20  0 - 37 U/L   ALT 14  0 - 35 U/L   Alkaline Phosphatase 86  39 - 117 U/L   Total Bilirubin 1.5 (*) 0.3 - 1.2 mg/dL   GFR calc non Af Amer 55 (*) >90 mL/min   GFR calc Af Amer 64 (*) >90 mL/min  CBC WITH DIFFERENTIAL      Result Value Ref Range   WBC 12.1 (*) 4.0 - 10.5 K/uL   RBC 4.77  3.87 - 5.11 MIL/uL   Hemoglobin 14.8  12.0 - 15.0 g/dL   HCT 44.4  36.0 - 46.0 %   MCV 93.1  78.0 - 100.0 fL   MCH 31.0  26.0 - 34.0 pg   MCHC 33.3  30.0 - 36.0 g/dL   RDW 13.5  11.5 - 15.5 %   Platelets 253  150 - 400 K/uL   Neutrophils Relative % 85 (*) 43 - 77 %   Neutro Abs 10.2 (*) 1.7 - 7.7 K/uL   Lymphocytes Relative 9 (*) 12 - 46 %   Lymphs Abs 1.1  0.7 - 4.0 K/uL   Monocytes Relative 5  3 - 12 %   Monocytes Absolute 0.7  0.1 - 1.0 K/uL   Eosinophils Relative 1  0 - 5 %   Eosinophils Absolute 0.1  0.0 - 0.7 K/uL   Basophils Relative 0  0 - 1 %   Basophils Absolute 0.0  0.0 - 0.1 K/uL  LIPASE, BLOOD      Result Value Ref Range   Lipase 30  11 - 59 U/L  TROPONIN I      Result Value Ref Range   Troponin I <0.30  <0.30 ng/mL   Dg Abd Acute W/chest  07/18/2013   CLINICAL DATA:  pain  EXAM: ACUTE ABDOMEN SERIES (ABDOMEN 2 VIEW & CHEST 1 VIEW)  COMPARISON:  DG CHEST 1V PORT dated 07/07/2012  FINDINGS: There is no  evidence of dilated bowel loops or free intraperitoneal air. No radiopaque calculi or other significant radiographic abnormality is seen. Heart size and mediastinal contours are within normal limits. The lungs are hyperinflated and there is flattening of the hemidiaphragms. Lungs otherwise clear. Postsurgical changes in the right axilla.  IMPRESSION: Negative abdominal radiographs. No acute cardiopulmonary disease COPD.   Electronically Signed   By: Margaree Mackintosh M.D.   On: 07/18/2013 16:45        EKG Interpretation   Date/Time:  Thursday July 18 2013 15:20:34 EDT Ventricular Rate:  64 PR Interval:  175 QRS Duration: 71 QT Interval:  449 QTC Calculation: 463 R Axis:   95 Text Interpretation:  Sinus rhythm No significant change since last  tracing Confirmed by Dillan Lunden  MD, Lennette Bihari ([id number]) on 07/18/2013 4:03:44 PM      MDM  Iv ns. o2 [place]. Labs. Cxr.  Iv ns bolus, zofran iv.  Pt requests pain med, dilaudid 1 mg iv.  Reviewed nursing notes and prior charts for additional history.   Wheezing on exam, alb and atrovent.   Discussed importance of smoking cessation regarding both breathing/copd, as well as lower extremity ulcers/pvd.     Nursing cleaned bilateral feet, toes, thin coat bacitracin to superficial ulcerations. Sterile dressing.  Pt feels is breathing at baseline on recheck. Pulse ox 96%.  No pna on cxr.   Nausea improved w meds. abd soft nt. abd films neg.  Vascular tech indicates no provider at [facility], states could arrange as outpt - as palp pulses, normal cap refill distally in  all toes, no necrosis/gangrene, feel pt does not need emergent study.    After staff cleans feet, superficial ulcerations to toes noted, but no skin discoloration or cyanosis. No cellulitis.   Pt lives alone, issues w being able to adequately clean feet.  Will get cm/sw consult re home health/wound care.   ua returns, tr le, 11-20 wbc, will culture and rx. Rocephin iv. Keflex rx.   bp improved  159/99.  Pt continues to state breathing at baseline.  Discussed idea ecf placement w pt, pt declines ecf, states wants to return to home.  Will make home health referral incl aid, rx, social work as feel likely in future pt may need to reconsider ecf.  Family members present in ED, and indicate they have offered to assist w home needs, bathing, foot care, etc.   On recheck, pt w recurrent nv, unable to tolerate po after repeat doses zofran.   Will try reglan for nausea.  Given recurrent/persistent nv, uti, copd, will admit to med service.       Mirna Mires, MD 07/18/13 [phone]

## 2013-07-18 NOTE — ED Notes (Signed)
Pt given ginger ale to drink. 

## 2013-07-18 NOTE — Discharge Instructions (Signed)
Keep feet very clean and dry.  Wash gently with soap and warm water 2x/day.  You may apply thin coat of bacitracin or neosporin to the superficial skin ulcers on your toes for the next few days. Follow up with podiatrist in the coming week - call tomorrow to arrange appointment. Also follow up with your primary care doctor for recheck in the next couple days.  Continue home oxygen, use albuterol treatment as need.  Absolutely no smoking. Follow up with home health agency tomorrow morning to arrange home health aid, nurse and wound care (see made a referral from the ER).  You can also contact your doctors office tomorrow morning and have them assist you in arranging optimal home health services. You may take zofran as need for nausea.  The lab tests indicate you may have a urine infection, take keflex as prescribed -  A urine culture was sent the results of which will be back in 2-3 days time, have your doctor follow up on that result then. Your blood pressure is high, continue your blood pressure medication and follow up with your doctor this week.   Return to ER if worse, new symptoms, trouble breathing, fevers, spreading redness or increased swelling of feet, other concern.  You were given pain medication in the ER - no driving for the next 6 hours.    Chronic Obstructive Pulmonary Disease Chronic obstructive pulmonary disease (COPD) is a common lung condition in which airflow from the lungs is limited. COPD is a general term that can be used to describe many different lung problems that limit airflow, including both chronic bronchitis and emphysema. If you have COPD, your lung function will probably never return to normal, but there are measures you can take to improve lung function and make yourself feel better.  CAUSES   Smoking (common).   Exposure to secondhand smoke.   Genetic problems.  Chronic inflammatory lung diseases or recurrent infections. SYMPTOMS   Shortness of breath,  especially with physical activity.   Deep, persistent (chronic) cough with a large amount of thick mucus.   Wheezing.   Rapid breaths (tachypnea).   Gray or bluish discoloration (cyanosis) of the skin, especially in fingers, toes, or lips.   Fatigue.   Weight loss.   Frequent infections or episodes when breathing symptoms become much worse (exacerbations).   Chest tightness. DIAGNOSIS  Your healthcare provider will take a medical history and perform a physical examination to make the initial diagnosis. Additional tests for COPD may include:   Lung (pulmonary) function tests.  Chest X-ray.  CT scan.  Blood tests. TREATMENT  Treatment available to help you feel better when you have COPD include:   Inhaler and nebulizer medicines. These help manage the symptoms of COPD and make your breathing more comfortable  Supplemental oxygen. Supplemental oxygen is only helpful if you have a low oxygen level in your blood.   Exercise and physical activity. These are beneficial for nearly all people with COPD. Some people may also benefit from a pulmonary rehabilitation program. HOME CARE INSTRUCTIONS   Take all medicines (inhaled or pills) as directed by your health care provider.  Only take over-the-counter or prescription medicines for pain, fever, or discomfort as directed by your health care provider.   Avoid over-the-counter medicines or cough syrups that dry up your airway (such as antihistamines) and slow down the elimination of secretions unless instructed otherwise by your healthcare provider.   If you are a smoker, the most important thing  that you can do is stop smoking. Continuing to smoke will cause further lung damage and breathing trouble. Ask your health care provider for help with quitting smoking. He or she can direct you to community resources or hospitals that provide support.  Avoid exposure to irritants such as smoke, chemicals, and fumes that aggravate  your breathing.  Use oxygen therapy and pulmonary rehabilitation if directed by your health care provider. If you require home oxygen therapy, ask your healthcare provider whether you should purchase a pulse oximeter to measure your oxygen level at home.   Avoid contact with individuals who have a contagious illness.  Avoid extreme temperature and humidity changes.  Eat healthy foods. Eating smaller, more frequent meals and resting before meals may help you maintain your strength.  Stay active, but balance activity with periods of rest. Exercise and physical activity will help you maintain your ability to do things you want to do.  Preventing infection and hospitalization is very important when you have COPD. Make sure to receive all the vaccines your health care provider recommends, especially the pneumococcal and influenza vaccines. Ask your healthcare provider whether you need a pneumonia vaccine.  Learn and use relaxation techniques to manage stress.  Learn and use controlled breathing techniques as directed by your health care provider. Controlled breathing techniques include:   Pursed lip breathing. Start by breathing in (inhaling) through your nose for 1 second. Then, purse your lips as if you were going to whistle and breathe out (exhale) through the pursed lips for 2 seconds.   Diaphragmatic breathing. Start by putting one hand on your abdomen just above your waist. Inhale slowly through your nose. The hand on your abdomen should move out. Then purse your lips and exhale slowly. You should be able to feel the hand on your abdomen moving in as you exhale.   Learn and use controlled coughing to clear mucus from your lungs. Controlled coughing is a series of short, progressive coughs. The steps of controlled coughing are:  1. Lean your head slightly forward.  2. Breathe in deeply using diaphragmatic breathing.  3. Try to hold your breath for 3 seconds.  4. Keep your mouth  slightly open while coughing twice.  5. Spit any mucus out into a tissue.  6. Rest and repeat the steps once or twice as needed. SEEK MEDICAL CARE IF:   You are coughing up more mucus than usual.   There is a change in the color or thickness of your mucus.   Your breathing is more labored than usual.   Your breathing is faster than usual.  SEEK IMMEDIATE MEDICAL CARE IF:   You have shortness of breath while you are resting.   You have shortness of breath that prevents you from:  Being able to talk.   Performing your usual physical activities.   You have chest pain lasting longer than 5 minutes.   Your skin color is more cyanotic than usual.  You measure low oxygen saturations for longer than 5 minutes with a pulse oximeter. MAKE SURE YOU:   Understand these instructions.  Will watch your condition.  Will get help right away if you are not doing well or get worse. Document Released: 12/22/2004 Document Revised: 01/02/2013 Document Reviewed: 11/08/2012 [facility] Patient Information 2014 [place], [place].    Smoking Hazards Smoking cigarettes is extremely bad for your health. Tobacco smoke has over 200 known poisons in it. It contains the poisonous gases nitrogen oxide and carbon monoxide. There are  over 60 chemicals in tobacco smoke that cause cancer. Some of the chemicals found in cigarette smoke include:   Cyanide.   Benzene.   Formaldehyde.   Methanol (wood alcohol).   Acetylene (fuel used in welding torches).   Ammonia.  Even smoking lightly shortens your life expectancy by several years. You can greatly reduce the risk of medical problems for you and your family by stopping now. Smoking is the most preventable cause of death and disease in our society. Within days of quitting smoking, your circulation improves, you decrease the risk of having a heart attack, and your lung capacity improves. There may be some increased phlegm in the first few days  after quitting, and it may take months for your lungs to clear up completely. Quitting for 10 years reduces your risk of developing lung cancer to almost that of a nonsmoker.  WHAT ARE THE RISKS OF SMOKING? Cigarette smokers have an increased risk of many serious medical problems, including:  Lung cancer.   Lung disease (such as pneumonia, bronchitis, and emphysema).   Heart attack and chest pain due to the heart not getting enough oxygen (angina).   Heart disease and peripheral blood vessel disease.   Hypertension.   Stroke.   Oral cancer (cancer of the lip, mouth, or voice box).   Bladder cancer.   Pancreatic cancer.   Cervical cancer.   Pregnancy complications, including premature birth.   Stillbirths and smaller newborn babies, birth defects, and genetic damage to sperm.   Early menopause.   Lower estrogen level for women.   Infertility.   Facial wrinkles.   Blindness.   Increased risk of broken bones (fractures).   Senile dementia.   Stomach ulcers and internal bleeding.   Delayed wound healing and increased risk of complications during surgery. Because of secondhand smoke exposure, children of smokers have an increased risk of the following:   Sudden infant death syndrome (SIDS).   Respiratory infections.   Lung cancer.   Heart disease.   Ear infections.  WHY IS SMOKING ADDICTIVE? Nicotine is the chemical agent in tobacco that is capable of causing addiction or dependence. When you smoke and inhale, nicotine is absorbed rapidly into the bloodstream through your lungs. Both inhaled and noninhaled nicotine may be addictive.  WHAT ARE THE BENEFITS OF QUITTING?  There are many health benefits to quitting smoking. Some are:   The likelihood of developing cancer and heart disease decreases. Health improvements are seen almost immediately.   Blood pressure, pulse rate, and breathing patterns start returning to normal soon after  quitting.   People who quit may see an improvement in their overall quality of life.  HOW DO YOU QUIT SMOKING? Smoking is an addiction with both physical and psychological effects, and longtime habits can be hard to change. Your health care provider can recommend:  Programs and community resources, which may include group support, education, or therapy.  Replacement products, such as patches, gum, and nasal sprays. Use these products only as directed. Do not replace cigarette smoking with electronic cigarettes (commonly called e-cigarettes). The safety of e-cigarettes is unknown, and some may contain harmful chemicals. FOR MORE INFORMATION  American Lung Association: www.lung.org  American Cancer Society: www.cancer.org Document Released: 04/21/2004 Document Revised: 01/02/2013 Document Reviewed: 09/03/2012 [facility] Patient Information 2014 [place], [place].    Wound Care Wound care helps prevent pain and infection.  You may need a tetanus shot if:  You cannot remember when you had your last tetanus shot.  You  have never had a tetanus shot.  The injury broke your skin. If you need a tetanus shot and you choose not to have one, you may get tetanus. Sickness from tetanus can be serious. HOME CARE   Only take medicine as told by your doctor.  Clean the wound daily with mild soap and water.  Change any bandages (dressings) as told by your doctor.  Put medicated cream and a bandage on the wound as told by your doctor.  Change the bandage if it gets wet, dirty, or starts to smell.  Take showers. Do not take baths, swim, or do anything that puts your wound under water.  Rest and raise (elevate) the wound until the pain and puffiness (swelling) are better.  Keep all doctor visits as told. GET HELP RIGHT AWAY IF:   Yellowish-white fluid (pus) comes from the wound.  Medicine does not lessen your pain.  There is a red streak going away from the wound.  You have a  fever. MAKE SURE YOU:   Understand these instructions.  Will watch your condition.  Will get help right away if you are not doing well or get worse. Document Released: 12/22/2007 Document Revised: 06/06/2011 Document Reviewed: 07/18/2010 [facility] Patient Information 2014 [place], [place]. Skin Ulcer A skin ulcer is an open sore that can be shallow or deep. Skin ulcers sometimes become infected and are difficult to treat. It may be 1 month or longer before real healing progress is made. CAUSES   Injury.  Problems with the veins or arteries.  Diabetes.  Insect bites.  Bedsores.  Inflammatory conditions. SYMPTOMS   Pain, redness, swelling, and tenderness around the ulcer.  Fever.  Bleeding from the ulcer.  Yellow or clear fluid coming from the ulcer. DIAGNOSIS  There are many types of skin ulcers. Any open sores will be examined. Certain tests will be done to determine the kind of ulcer you have. The right treatment depends on the type of ulcer you have. TREATMENT  Treatment is a long-term challenge. It may include:  Wearing an elastic wrap, compression stockings, or gel cast over the ulcer area.  Taking antibiotic medicines or putting antibiotic creams on the affected area if there is an infection. HOME CARE INSTRUCTIONS  Put on your bandages (dressings), wraps, or casts over the ulcer as directed by your caregiver.  Change all dressings as directed by your caregiver.  Take all medicines as directed by your caregiver.  Keep the affected area clean and dry.  Avoid injuries to the affected area.  Eat a well-balanced, healthy diet that includes plenty of fruit and vegetables.  If you smoke, consider quitting or decreasing the amount of cigarettes you smoke.  Once the ulcer heals, get regular exercise as directed by your caregiver.  Work with your caregiver to make sure your blood pressure, cholesterol, and diabetes are well-controlled.  Keep your skin  moisturized. Dry skin can crack and lead to skin ulcers. SEEK IMMEDIATE MEDICAL CARE IF:   Your pain gets worse.  You have swelling, redness, or fluids around the ulcer.  You have chills.  You have a fever. MAKE SURE YOU:   Understand these instructions.  Will watch your condition.  Will get help right away if you are not doing well or get worse. Document Released: 04/21/2004 Document Revised: 06/06/2011 Document Reviewed: 10/29/2010 [facility] Patient Information 2014 [place], [place].   Hypertension As your heart beats, it forces blood through your arteries. This force is your blood pressure. If the pressure  is too high, it is called hypertension (HTN) or high blood pressure. HTN is dangerous because you may have it and not know it. High blood pressure may mean that your heart has to work harder to pump blood. Your arteries may be narrow or stiff. The extra work puts you at risk for heart disease, stroke, and other problems.  Blood pressure consists of two numbers, a higher number over a lower, 110/72, for example. It is stated as "110 over 72." The ideal is below 120 for the top number (systolic) and under 80 for the bottom (diastolic). Write down your blood pressure today. You should pay close attention to your blood pressure if you have certain conditions such as:  Heart failure.  Prior heart attack.  Diabetes  Chronic kidney disease.  Prior stroke.  Multiple risk factors for heart disease. To see if you have HTN, your blood pressure should be measured while you are seated with your arm held at the level of the heart. It should be measured at least twice. A one-time elevated blood pressure reading (especially in the Emergency Department) does not mean that you need treatment. There may be conditions in which the blood pressure is different between your right and left arms. It is important to see your caregiver soon for a recheck. Most people have essential hypertension which  means that there is not a specific cause. This type of high blood pressure may be lowered by changing lifestyle factors such as:  Stress.  Smoking.  Lack of exercise.  Excessive weight.  Drug/tobacco/alcohol use.  Eating less salt. Most people do not have symptoms from high blood pressure until it has caused damage to the body. Effective treatment can often prevent, delay or reduce that damage. TREATMENT  When a cause has been identified, treatment for high blood pressure is directed at the cause. There are a large number of medications to treat HTN. These fall into several categories, and your caregiver will help you select the medicines that are best for you. Medications may have side effects. You should review side effects with your caregiver. If your blood pressure stays high after you have made lifestyle changes or started on medicines,   Your medication(s) may need to be changed.  Other problems may need to be addressed.  Be certain you understand your prescriptions, and know how and when to take your medicine.  Be sure to follow up with your caregiver within the time frame advised (usually within two weeks) to have your blood pressure rechecked and to review your medications.  If you are taking more than one medicine to lower your blood pressure, make sure you know how and at what times they should be taken. Taking two medicines at the same time can result in blood pressure that is too low. SEEK IMMEDIATE MEDICAL CARE IF:  You develop a severe headache, blurred or changing vision, or confusion.  You have unusual weakness or numbness, or a faint feeling.  You have severe chest or abdominal pain, vomiting, or breathing problems. MAKE SURE YOU:   Understand these instructions.  Will watch your condition.  Will get help right away if you are not doing well or get worse. Document Released: 03/14/2005 Document Revised: 06/06/2011 Document Reviewed: 11/02/2007 [facility]  Patient Information 2014 [place].   Urinary Tract Infection Urinary tract infections (UTIs) can develop anywhere along your urinary tract. Your urinary tract is your body's drainage system for removing wastes and extra water. Your urinary tract includes two  kidneys, two ureters, a bladder, and a urethra. Your kidneys are a pair of bean-shaped organs. Each kidney is about the size of your fist. They are located below your ribs, one on each side of your spine. CAUSES Infections are caused by microbes, which are microscopic organisms, including fungi, viruses, and bacteria. These organisms are so small that they can only be seen through a microscope. Bacteria are the microbes that most commonly cause UTIs. SYMPTOMS  Symptoms of UTIs may vary by age and gender of the patient and by the location of the infection. Symptoms in young women typically include a frequent and intense urge to urinate and a painful, burning feeling in the bladder or urethra during urination. Older women and men are more likely to be tired, shaky, and weak and have muscle aches and abdominal pain. A fever may mean the infection is in your kidneys. Other symptoms of a kidney infection include pain in your back or sides below the ribs, nausea, and vomiting. DIAGNOSIS To diagnose a UTI, your caregiver will ask you about your symptoms. Your caregiver also will ask to provide a urine sample. The urine sample will be tested for bacteria and white blood cells. White blood cells are made by your body to help fight infection. TREATMENT  Typically, UTIs can be treated with medication. Because most UTIs are caused by a bacterial infection, they usually can be treated with the use of antibiotics. The choice of antibiotic and length of treatment depend on your symptoms and the type of bacteria causing your infection. HOME CARE INSTRUCTIONS  If you were prescribed antibiotics, take them exactly as your caregiver instructs you. Finish the  medication even if you feel better after you have only taken some of the medication.  Drink enough water and fluids to keep your urine clear or pale yellow.  Avoid caffeine, tea, and carbonated beverages. They tend to irritate your bladder.  Empty your bladder often. Avoid holding urine for long periods of time.  Empty your bladder before and after sexual intercourse.  After a bowel movement, women should cleanse from front to back. Use each tissue only once. SEEK MEDICAL CARE IF:   You have back pain.  You develop a fever.  Your symptoms do not begin to resolve within 3 days. SEEK IMMEDIATE MEDICAL CARE IF:   You have severe back pain or lower abdominal pain.  You develop chills.  You have nausea or vomiting.  You have continued burning or discomfort with urination. MAKE SURE YOU:   Understand these instructions.  Will watch your condition.  Will get help right away if you are not doing well or get worse. Document Released: 12/22/2004 Document Revised: 09/13/2011 Document Reviewed: 04/22/2011 [facility] Patient Information 2014 [place].     Emergency Department Resource Guide 1) Find a Doctor and Pay Out of Pocket Although you won't have to find out who is covered by your insurance plan, it is a good idea to ask around and get recommendations. You will then need to call the office and see if the doctor you have chosen will accept you as a new patient and what types of options they offer for patients who are self-pay. Some doctors offer discounts or will set up payment plans for their patients who do not have insurance, but you will need to ask so you aren't surprised when you get to your appointment.  2) Contact Your Local Health Department Not all health departments have doctors that can see patients for  sick visits, but many do, so it is worth a call to see if yours does. If you don't know where your local health department is, you can check in your phone book.  The CDC also has a tool to help you locate your state's health department, and many state websites also have listings of all of their local health departments.  3) Find a [facility] If your illness is not likely to be very severe or complicated, you may want to try a walk in clinic. These are popping up all over the country in pharmacies, drugstores, and shopping centers. They're usually staffed by nurse practitioners or physician assistants that have been trained to treat common illnesses and complaints. They're usually fairly quick and inexpensive. However, if you have serious medical issues or chronic medical problems, these are probably not your best option.  No Primary Care Doctor: - Call [facility] at  [phone] - they can help you locate a primary care doctor that  accepts your insurance, provides certain services, etc. - Physician Referral Service- [phone]  Chronic Pain Problems: Organization         Address  Phone   Notes  [facility]  [phone] Patients need to be referred by their primary care doctor.   Medication Assistance: Organization         Address  Phone   Notes  [facility] Medication [facility] [address] [place], [place] [address] [phone] --Must be a resident of [facility] -- Must have NO insurance coverage whatsoever (no Medicaid/ Medicare, etc.) -- The pt. MUST have a primary care doctor that directs their care regularly and follows them in the community   [facility]  [phone]   [employer]  [phone]    Agencies that provide inexpensive medical care: Organization         Address  Phone   Notes  [place]  [phone]   [facility]    [phone]   [facility] [place] [facility] [place], [place] [address] [phone]   [place] [address] [place] [phone]   [facility]     [phone]   [facility]    [phone]   [address], [place] Phone:  [phone], Fax:  [phone] Hours of Operation:  9 am - 6 pm, M-F.  Also accepts Medicaid/Medicare and self-pay.  [facility] for [address], [address], [place] Phone: [phone], Fax: [phone]. Hours of Operation:  8:30 am - 5:30 pm, M-F.  Also accepts Medicaid and self-pay.  [facility] [place] [address], [place] Phone: [phone]   [place], [place], [place] [phone], Ext. [phone] Mondays & Thursdays: 7-9 AM.  First 15 patients are seen on a first come, first serve basis.    [place] Providers:  Organization         Address  Phone   Notes  [facility] [address], [place] [phone] Also accepts self-pay patients.  [place], [place]  [phone]   [place], [address], [place] [phone]   [facility] [address] [place] [phone]  Lucianne Lei [address], [place]   [phone] Only accepts [place] patients after they have their name applied to their card.   Self-Pay (no insurance) in [facility]:  Organization         Address  Phone   Notes  Sickle Cell Patients, [facility] Internal Medicine [place] [phone]   [facility] [place] [phone]   [facility] [place]  [place], [place], [place] [phone]   [facility]/Dr. Osei-Bonsu  [address] [place] or [address], [place] [phone] Phone number for both [place] and [place] locations is the same.  Urgent Medical and  [facility] [address] [place] [phone]   [facility] [address] [place] or [address] [phone]   [facility] [address] [place] [phone], phone; [phone], fax Sees patients 1st and 3rd Saturday of every month.  Must not qualify for public or private insurance (i.e. Medicaid, Medicare, [place] Health Choice, Veterans' Benefits)  Household income should be no more than 200% of the poverty level The clinic cannot treat you if you are pregnant or think you are pregnant  Sexually transmitted diseases are not treated at the clinic.    Dental Care: Organization         Address  Phone  Notes  [facility] Department of [facility] [place] [phone] Accepts children up to age 68 who are enrolled in [place] or [place]; pregnant women with a Medicaid card; and children who have applied for Medicaid or [place] Health Choice, but were declined, whose parents can pay a reduced fee at time of service.  [facility] Department of [facility]  [address], [place] [phone] Accepts children up to age 2 who are enrolled in [place] or [place]; pregnant women with a Medicaid card; and children who have applied for Medicaid or [place] Health Choice, but were declined, whose parents can pay a reduced fee at time of service.  [place] Adult Dental Access PROGRAM  [place] [phone] Patients are seen by appointment only. Walk-ins are not accepted. [place] will see patients 86 years of age and older. Monday - Tuesday (8am-5pm) Most Wednesdays (8:30-5pm) $30 per visit, cash only  [facility] Adult Dental Access PROGRAM  [address], [facility] [phone] Patients are seen by appointment only. Walk-ins are not accepted. [place] will see patients 58 years of age and  older. One Wednesday Evening (Monthly: Volunteer Based).  $30 per visit, cash only  [place]  [phone] for adults; Children under age 74, call Graduate Pediatric Dentistry at [phone]. Children aged 26-14, please call [phone] to request a pediatric application.  Dental services are provided in all areas of dental care including fillings, crowns and bridges, complete and partial dentures, implants, gum treatment, root canals, and extractions. Preventive care is also provided. Treatment is provided to both adults and children. Patients are selected via a lottery and there is often a waiting list.   [facility] [address], [place]  [phone] [email or web address], [place], [place] [phone], Ext.  123 Second and Fourth Thursday of each month, opens at 6:30 AM; Clinic ends at 9 AM.  Patients are seen on a first-come first-served basis, and a limited number are seen during each clinic.   [facility]  [address] Hillard Danker [place], [place] [phone]   Eligibility Requirements You must have lived in [place], [place], or [place] counties for at least the last three months.   You cannot be eligible for state or federal sponsored [employer], including [employer], [place], or [employer].   You generally cannot be eligible for healthcare insurance through your employer.    How to apply: Eligibility screenings are held every Tuesday and Wednesday afternoon from 1:00 pm until 4:00 pm. You do not need an appointment for the interview!  [facility] [address] [place], [place]   [place]  [place] Department  [place]  [phone]    Behavioral Health Resources in the Community: Intensive Outpatient Programs Organization          Address  Phone  Notes  [place] Hallowell. [address] [place], [place] [phone]   [facility] [address] [place], [place]   ADS: [facility] [address], [place], [place]   [place] [address],  [place], [place] or [phone]   Substance Abuse Resources Organization         Address  Phone  Notes  Al[facility] Drug Services  ([phone]) [phone]   [place]  [phone]   The [place]   Chinita Pester  ([phone]   Residential & Outpatient Substance Abuse Program  5855222844   Psychological Services Organization         Address  Phone  Notes  [facility] [place]  [place]  [phone]   [place] [address] [place] or [phone]    Mobile Crisis Teams Organization         Address  Phone  Notes  [facility], Mobile Crisis Care Unit  [phone]   [facility]  [address] [place], [place]   [place] [address], [place] [phone]    Self-Help/Support Groups Organization         Address  Phone             Notes  [place]. of [place] - variety of support groups  [place] Call for more information  Narcotics Anonymous (NA), [facility] [address], [employer] [place]  2 meetings at this location   Special educational needs teacher         Address  Phone  Notes  [facility] [place],    [place]  [phone]   [facility]  [address], [place] [id number], [place], [place]   [place], [place] [phone] Admissions: 8am-3pm M-F  Incentives Substance [place] [address]    [place], [place] [phone]   [facility] [address] Jadene Pierini [place], [place]   [facility] [place].,  [place], [place] - Intensive Outpatient [address], [place], [place]   [facility] ([place]  [facility] [address]  °[place], [place] [phone] or [phone]   °[facility] ([facility]) [address] [place], [place] [phone] Accepts Medicaid  °[facility] [address]  °[place] 1-800-659-3381 Substance Abuse/Addiction Treatment  ° °[place] Behavioral Health Resources °Organization         Address  Phone  Notes  °[facility]  (888) [phone]   °Julie Brannon, PhD [address] [place], [place]   ([phone]) [phone] or (336) [phone]   °[facility]   [address] °[place], [place] ([phone]) [phone]   °[facility] [address], [place], [place] ([phone]) [phone] Insurance/Medicaid/sponsorship through [facility]  °[facility] [address]                                    [place], [place] [phone] Therapy/tele-psych/case  °[facility] [address]  ° [place], [place] ([phone]) [phone]    °Dr. Arfeen  ([phone]) [phone]   °[facility] [place]  [employer] [place] [facility] 1) [address], [place] °2) [address], [place] °3)  [address], [place] (336) [phone] °(336) [phone] ° °(336) [phone]   °[place] Child Abuse Hotline (336) 342-1394 or (336) [phone] (After Hours)    ° ° ° °

## 2013-07-18 NOTE — ED Notes (Signed)
Per EMS, pt from home.  Pt seen Tuesday for toe infection.  Pt possible gangrene infection.  Was given antibiotics.  Today pt has been nauseated and vomiting.  More weak than normal at home.  Hx ETOH, COPD, HTN, Breast CA,   Vitals: 218/120, hr 57, 100% 2l (home dosing), resp 16.  CBG 119  NSR  20g LAC, 4mg  zofran given

## 2013-07-18 NOTE — Progress Notes (Signed)
CARE MANAGEMENT ED NOTE 07/18/2013  Patient:  Renee Donovan, Renee Donovan   Account Number:  [id number]  Date Initiated:  07/18/2013  Documentation initiated by:  Livia Snellen  Subjective/Objective Assessment:   Patient presents to Ed with vomitting and weakness     Subjective/Objective Assessment Detail:   Patient wears oxygen at home supplied by [place], history of ETOH abuse, HTN, COPD, breast cancer     Action/Plan:   Action/Plan Detail:   Anticipated DC Date:  07/18/2013     Status Recommendation to Physician:   Result of Recommendation:    Other ED Services  Consult Working Plan   In-house referral  Clinical Social Worker   [place]  CM consult  Other    Choice offered to / List presented to:  C-4 Adult Children     [place] arranged  HH-1 RN  [place]      [place].    Status of service:    ED Comments:   ED Comments Detail:  [facility] consulted by EDP to seepatient regarding home health care.  EDCM spoke to patient, patient's grandson and patient's daughter-in-law Nema Oatley at bedside. Myra's phone number [phone].  Patient lives alone at home.  Myra reports she lives right around the corner from the patient.  Patient does not have home health services at this time but has had home health services with [facility] in the past.  Patient wears oxygen at home supplied by [facility]. Patient has a walker, nebulizer bedside commode, shower chair and safety rails in the bathroom.  Patient staes, "I walk just fine, but I use my walker all the time."  Patient reports she will be able to answer the door for home health services.  EDCM instructed patient and family that someone needs to be in the home when home health arrives for teaching purposes.  Patient's daughter in law reports she will be able to arrange her schedule to be at patient's home for services.  Patient receives meals on wheels. Patient's  grandson does patient's grocery shopping and patient's daughter in law prepares and brings meals to the patient.  EDCM provided patient with list of home health agencies in [facility], a printed list of private duty agencies, printed information regarding [facility], and printed information about the [employer] of [place].  EDCM informed patient and patient's family with home health the patient may receive a visiting RN, PT, OT aide and social worker if needed.  Explained with patient's insurance and aide may not be possible.  EDCM also explained to patient that private duty nursing may be an out of pocket expense for patient.  Patient has informed EDCM that her pcp Dr. Linda Hedges has retired and is currently without a pcp.  Patient's daughter in law reports she will start looking for a pcp for the patient immediately. Patient and patient's family agreeable to fax referral for home health to [facility].  [facility] to [facility] at [phone] at 1926pm with confirmation of receipt at 1931pm.  Patient also agreeable for referral to [facility]. EDCM informed patient and her family that the home health agency has 24-48 hours to contact them.  [facility] provided patient with [facility] to [facility].  All printed resources given to patient's daughter per patient request.  Patient reports that she would like to go home and not to a facility.  EDCM informed patient she  may ask social worker to assist in applying for Medicaid is she decides she needs ALf in the future.  [facility] consulted EDSW regarding APS and ETOH issues.  Patient and family thankful for resources. No further EDCM needs at this time.

## 2013-07-18 NOTE — ED Notes (Signed)
Hospitalist MD at bedside. 

## 2013-07-19 ENCOUNTER — Telehealth: Payer: Self-pay | Admitting: Internal Medicine

## 2013-07-19 DIAGNOSIS — F1010 Alcohol abuse, uncomplicated: Secondary | ICD-10-CM

## 2013-07-19 DIAGNOSIS — L97509 Non-pressure chronic ulcer of other part of unspecified foot with unspecified severity: Secondary | ICD-10-CM

## 2013-07-19 DIAGNOSIS — J438 Other emphysema: Secondary | ICD-10-CM

## 2013-07-19 LAB — COMPREHENSIVE METABOLIC PANEL WITH GFR
ALT: 10 U/L (ref 0–35)
AST: 14 U/L (ref 0–37)
Albumin: 3.4 g/dL — ABNORMAL LOW (ref 3.5–5.2)
Alkaline Phosphatase: 65 U/L (ref 39–117)
BUN: 26 mg/dL — ABNORMAL HIGH (ref 6–23)
CO2: 29 meq/L (ref 19–32)
Calcium: 8.7 mg/dL (ref 8.4–10.5)
Chloride: 97 meq/L (ref 96–112)
Creatinine, Ser: 1.72 mg/dL — ABNORMAL HIGH (ref 0.50–1.10)
GFR calc Af Amer: 31 mL/min — ABNORMAL LOW
GFR calc non Af Amer: 27 mL/min — ABNORMAL LOW
Glucose, Bld: 95 mg/dL (ref 70–99)
Potassium: 3.9 meq/L (ref 3.7–5.3)
Sodium: 140 meq/L (ref 137–147)
Total Bilirubin: 0.9 mg/dL (ref 0.3–1.2)
Total Protein: 6.1 g/dL (ref 6.0–8.3)

## 2013-07-19 LAB — CBC WITH DIFFERENTIAL/PLATELET
Basophils Absolute: 0 10*3/uL (ref 0.0–0.1)
Basophils Relative: 0 % (ref 0–1)
Eosinophils Absolute: 0 10*3/uL (ref 0.0–0.7)
Eosinophils Relative: 0 % (ref 0–5)
HCT: 38.5 % (ref 36.0–46.0)
Hemoglobin: 12.8 g/dL (ref 12.0–15.0)
Lymphocytes Relative: 13 % (ref 12–46)
Lymphs Abs: 2 10*3/uL (ref 0.7–4.0)
MCH: 31.2 pg (ref 26.0–34.0)
MCHC: 33.2 g/dL (ref 30.0–36.0)
MCV: 93.9 fL (ref 78.0–100.0)
Monocytes Absolute: 1.5 10*3/uL — ABNORMAL HIGH (ref 0.1–1.0)
Monocytes Relative: 10 % (ref 3–12)
Neutro Abs: 11.9 10*3/uL — ABNORMAL HIGH (ref 1.7–7.7)
Neutrophils Relative %: 77 % (ref 43–77)
Platelets: 248 10*3/uL (ref 150–400)
RBC: 4.1 MIL/uL (ref 3.87–5.11)
RDW: 13.9 % (ref 11.5–15.5)
WBC: 15.4 10*3/uL — ABNORMAL HIGH (ref 4.0–10.5)

## 2013-07-19 LAB — GLUCOSE, CAPILLARY
Glucose-Capillary: 121 mg/dL — ABNORMAL HIGH (ref 70–99)
Glucose-Capillary: 81 mg/dL (ref 70–99)
Glucose-Capillary: 87 mg/dL (ref 70–99)
Glucose-Capillary: 89 mg/dL (ref 70–99)

## 2013-07-19 LAB — TSH: TSH: 4.43 u[IU]/mL (ref 0.350–4.500)

## 2013-07-19 LAB — SEDIMENTATION RATE: Sed Rate: 8 mm/hr (ref 0–22)

## 2013-07-19 MED ORDER — ALBUTEROL SULFATE (2.5 MG/3ML) 0.083% IN NEBU
2.5000 mg | INHALATION_SOLUTION | Freq: Two times a day (BID) | RESPIRATORY_TRACT | Status: DC
  Administered 2013-07-20 – 2013-07-23 (×7): 2.5 mg via RESPIRATORY_TRACT
  Filled 2013-07-19 (×7): qty 3

## 2013-07-19 MED ORDER — ENOXAPARIN SODIUM 30 MG/0.3ML ~~LOC~~ SOLN
30.0000 mg | SUBCUTANEOUS | Status: DC
  Administered 2013-07-19 – 2013-07-20 (×2): 30 mg via SUBCUTANEOUS
  Filled 2013-07-19 (×3): qty 0.3

## 2013-07-19 NOTE — Progress Notes (Addendum)
Clinical Social Work  CSW went to room in order to follow up from EDSW assessment. CSW met with patient in room at bedside. CSW introduced myself and explained role. Patient reports that she lives at home alone and that her family assists her as needed. Patient reports she has already spoken to EDSW last night about her plans to return home. Patient continues to refuse place at SNF or ALF and reports she will only return home. At this time, patient's grandson and his girlfriend entered the room. Patient agreeable to family involvement.  Grandson reports that patient and son (grandson's father) had a "falling out" and are no longer on speaking terms. Grandson reports he is involved as possible and lives about 10-15 minutes away from patient. Grandson and mother have been assisting with caring for patient and mother lives close to patient. Grandson plans to move to [place] in the next few weeks and reports that mother will assist for patient on her own. At this time, patient receives Meals on Wheels and family picks up her medications and places them in pill boxes. Patient reports she takes all medication on her own but does occasionally forget a dose. Family also assists with transportation to appointments since patient does not drive anymore.  CSW asked to discuss patient's substance use and patient is agreeable to discuss her consumption in front of family.CSW completed SBIRT with patient. Patient reports she drinks red wine everyday but that consumption varies. Patient reports she has drank for several years and does not plan on stopping. Patient reports she is not depressed and does not feel she needs treatment. Patient feels she drinks because she is bored and lonely. CSW spoke with patient about resources for support groups or adult day center through [employer] [facility] [place]. Patient reports she is "a homebody" and does not want to leave her house. Grandson agrees that patient likes being home by  herself and is not worried about her drinking habits. Grandson reports that patient is usually stable and has only fallen once or twice while drinking. CSW offered resources but patient and family declined.  Patient wants to return home and family agreeable to this plan. Family received HH information from ED and grandson reports he will take patient home at DC. CSW made CM aware of possible HH/equipment needs at DC.  CSW called and left a message at APS in order to follow up on report.  CSW will continue to follow.  [place], [place] [phone]

## 2013-07-19 NOTE — Telephone Encounter (Signed)
Error // ck °

## 2013-07-19 NOTE — Progress Notes (Signed)
Received referral for [facility] Care Management from ED RN case management services. Came to bedside, patient was asleep. Called Ellah Otte at [phone], daughter-in-law to discuss [place] Management services. However, had to leave voicemail. Questionable whether patient will be [facility] Care Management appropriate as it appears patient has not established with a new Primary Care MD as of yet per notes. Will make inpatient RN case manager aware. Marthenia Rolling, MSN- [facility] [phone]

## 2013-07-19 NOTE — Progress Notes (Addendum)
[facility] Progress Note   Renee Donovan HKV:[id number] DOB: 17-Aug-1930 DOA: 07/18/2013 PCP: [name], MD  Brief narrative: Renee Donovan is a 80 y.o. female presenting on 07/18/2013 with  with history of COPD, hypothyroidism, chronic alcoholism, tobacco abuse, history of breast cancer, hypertension started experiencing nausea vomiting and left flank pain on 4/23.    Subjective: Unable to tell me if she feels any better than yesterday but flank pain has resolved.   Assessment/Plan: Principal Problem:   Nausea and vomiting- possibly gastroenteritis? - PRN Zofran - clear liquids  Active Problems: UTI - mild - f/u culture - cont Rocephin  ETOH abuse - CIWA scale ordered    Hypothyroidism - cont levothyroxine    COPD (chronic obstructive pulmonary disease) with emphysema - stable    Toe ulcer - highly suspect PVD which is likely worse due to her smoking - check ABI    Accelerated hypertension - resolved - cont Clonidine patch and Verapamil - zestoretic on hold  ARF - due to dehydration vs malignant HTN - cont IVF - follow in AM    Code Status: full code Family Communication: none Disposition Plan: home in 1-2 days  Consultants: none  Procedures: none  Antibiotics: Antibiotics Given (last 72 hours)   None       DVT prophylaxis: Lovenox  Objective: Filed Weights   07/18/13 2220  Weight: 77.066 kg (169 lb 14.4 oz)   Blood pressure 119/52, pulse 65, temperature 98.6 F (37 C), temperature source Oral, resp. rate 20, height 5\' 6"  (1.676 m), weight 77.066 kg (169 lb 14.4 oz), SpO2 91.00%.  Intake/Output Summary (Last 24 hours) at 07/19/13 1339 Last data filed at 07/19/13 0400  Gross per 24 hour  Intake    415 ml  Output      0 ml  Net    415 ml     Exam: General: No acute respiratory distress Lungs: Clear to auscultation bilaterally without wheezes or crackles Cardiovascular: Regular rate and rhythm without murmur  gallop or rub normal S1 and S2 Abdomen: Nontender, nondistended, soft, bowel sounds positive, no rebound, no ascites, no appreciable mass Extremities: No significant cyanosis, clubbing, or edema bilateral lower extremities  Data Reviewed: Basic Metabolic Panel:  Recent Labs Lab 07/18/13 1322 07/19/13 0434  NA 143 140  K 4.2 3.9  CL 98 97  CO2 31 29  GLUCOSE 133* 95  BUN 16 26*  CREATININE 0.94 1.72*  CALCIUM 9.5 8.7   Liver Function Tests:  Recent Labs Lab 07/18/13 1322 07/19/13 0434  AST 20 14  ALT 14 10  ALKPHOS 86 65  BILITOT 1.5* 0.9  PROT 7.7 6.1  ALBUMIN 4.1 3.4*    Recent Labs Lab 07/18/13 1322  LIPASE 30   No results found for this basename: AMMONIA,  in the last 168 hours CBC:  Recent Labs Lab 07/18/13 1322 07/19/13 0434  WBC 12.1* 15.4*  NEUTROABS 10.2* 11.9*  HGB 14.8 12.8  HCT 44.4 38.5  MCV 93.1 93.9  PLT 253 248   Cardiac Enzymes:  Recent Labs Lab 07/18/13 1322  TROPONINI <0.30   BNP (last 3 results) No results found for this basename: PROBNP,  in the last 8760 hours CBG:  Recent Labs Lab 07/19/13 0008 07/19/13 0721 07/19/13 1212  GLUCAP 121* 87 89    No results found for this or any previous visit (from the past 240 hour(s)).   Studies:  Recent x-ray studies have been reviewed in detail by the Attending Physician  Scheduled Meds:  Scheduled Meds: . buPROPion  300 mg Oral Daily  . cefTRIAXone (ROCEPHIN)  IV  1 g Intravenous Q24H  . cloNIDine  0.1 mg Transdermal Weekly  . enoxaparin (LOVENOX) injection  30 mg Subcutaneous Q24H  . folic acid  1 mg Oral Daily  . levothyroxine  50 mcg Oral QAC breakfast  . LORazepam  0-4 mg Intravenous Q6H   Followed by  . [START ON 07/20/2013] LORazepam  0-4 mg Intravenous Q12H  . lovastatin  40 mg Oral QHS  . multivitamin with minerals  1 tablet Oral Daily  . thiamine  100 mg Oral Daily   Or  . thiamine  100 mg Intravenous Daily  . tiotropium  18 mcg Inhalation Daily  .  verapamil  240 mg Oral BID   Continuous Infusions: . sodium chloride 75 mL/hr at 07/18/13 2228    Time spent on care of this patient: 30 min   Debbe Odea, MD 07/19/2013, 1:39 PM  LOS: 1 day   [facility] Office  [phone] Pager - Text Page per Shea Evans   If 7PM-7AM, please contact night-coverage [email or web address]

## 2013-07-19 NOTE — Progress Notes (Signed)
VASCULAR LAB PRELIMINARY  ARTERIAL  ABI completed:  Bilateral ABIs indicate moderate decrease in flow, right greater than left.    RIGHT    LEFT    PRESSURE WAVEFORM  PRESSURE WAVEFORM  BRACHIAL 151 Triphasic  BRACHIAL 158 Triphasic   DP 88 Monophasic  DP 106 Monophasic   AT   AT    PT absent  PT absent   PER 94 Monophasic  PER 96 Monophasic   GREAT TOE  NA GREAT TOE  NA    RIGHT LEFT  ABI 0.59 moderate decrease 0.67  Moderate decrease     [name], RVT 07/19/2013, 4:13 PM

## 2013-07-19 NOTE — Consult Note (Signed)
WOC wound consult note Reason for Consult: Toe wounds bilaterally Wound type:trauma vs arterial insufficiency Pressure Ulcer POA: No Measurement:right great toe:  Two ulcers, full thickness in an area measuring 2cm x 1cm. Right foot, second digit with full thickness ulcer measuring 1.5cm x 1cm.  Left foot, 4th digit with a full thickness ulcer measuring 1cm round.  All wound beds are obscured by the presence of thin yellow slough. Toes with rubor indicative or some degree of arterial insufficiency.  Extremities are hairless. Wound bed:As described above. Drainage (amount, consistency, odor) None Periwound:intact with rubor, no warmth or induration Dressing procedure/placement/frequency:Twice daily saline dressings are to be initiate by nursing today. Patient instructed on protective footwear and is told he is most comfortable going barefoot at home.  States she scraped toes on carpet after or during recent fall and that similar injuries she has had in the past have all healed "just fine". [place] nursing team will not follow, but will remain available to this patient, the nursing and medical team.  Please re-consult if needed. Thanks, Maudie Flakes, MSN, RN, [place], [place], [place] [phone])

## 2013-07-19 NOTE — Care Management Note (Unsigned)
    Page 1 of 1   07/19/2013     3:39:03 PM CARE MANAGEMENT NOTE 07/19/2013  Patient:  Renee Donovan, Renee Donovan   Account Number:  [id number]  Date Initiated:  07/19/2013  Documentation initiated by:  [facility]  Subjective/Objective Assessment:   80 year old female admitted with nausea, vomiting and UTI.     Action/Plan:   From home alone. I have requested MD to order PT/OT consults to help determine d/c needs.   Anticipated DC Date:  07/22/2013   Anticipated DC Plan:        DC Planning Services  CM consult      Choice offered to / List presented to:             Status of service:  In process, will continue to follow Medicare Important Message given?  NA - LOS <3 / Initial given by admissions (If response is "NO", the following Medicare IM given date fields will be blank) Date Medicare IM given:   Date Additional Medicare IM given:    Discharge Disposition:    Per UR Regulation:  Reviewed for med. necessity/level of care/duration of stay  If discussed at [place] of Stay Meetings, dates discussed:    Comments:

## 2013-07-20 DIAGNOSIS — J449 Chronic obstructive pulmonary disease, unspecified: Secondary | ICD-10-CM

## 2013-07-20 LAB — URINE CULTURE
Colony Count: 8000
Special Requests: NORMAL

## 2013-07-20 LAB — BASIC METABOLIC PANEL
BUN: 30 mg/dL — AB (ref 6–23)
CALCIUM: 9 mg/dL (ref 8.4–10.5)
CO2: 27 meq/L (ref 19–32)
Chloride: 97 mEq/L (ref 96–112)
Creatinine, Ser: 1.7 mg/dL — ABNORMAL HIGH (ref 0.50–1.10)
GFR calc Af Amer: 31 mL/min — ABNORMAL LOW (ref >90)
GFR calc non Af Amer: 27 mL/min — ABNORMAL LOW (ref >90)
GLUCOSE: 75 mg/dL (ref 70–99)
Potassium: 3.7 mEq/L (ref 3.7–5.3)
Sodium: 137 mEq/L (ref 137–147)

## 2013-07-20 LAB — GLUCOSE, CAPILLARY
Glucose-Capillary: 104 mg/dL — ABNORMAL HIGH (ref 70–99)
Glucose-Capillary: 78 mg/dL (ref 70–99)
Glucose-Capillary: 83 mg/dL (ref 70–99)

## 2013-07-20 LAB — CBC
HCT: 37 % (ref 36.0–46.0)
Hemoglobin: 12.4 g/dL (ref 12.0–15.0)
MCH: 31.8 pg (ref 26.0–34.0)
MCHC: 33.5 g/dL (ref 30.0–36.0)
MCV: 94.9 fL (ref 78.0–100.0)
PLATELETS: 212 10*3/uL (ref 150–400)
RBC: 3.9 MIL/uL (ref 3.87–5.11)
RDW: 13.8 % (ref 11.5–15.5)
WBC: 11.1 10*3/uL — ABNORMAL HIGH (ref 4.0–10.5)

## 2013-07-20 MED ORDER — SODIUM CHLORIDE 0.9 % IV BOLUS (SEPSIS): 1000.0000 mL | INTRAVENOUS | Status: DC | PRN

## 2013-07-20 MED ORDER — ATORVASTATIN CALCIUM 10 MG PO TABS
10.0000 mg | ORAL_TABLET | Freq: Every day | ORAL | Status: DC
  Administered 2013-07-20 – 2013-07-28 (×9): 10 mg via ORAL
  Filled 2013-07-20 (×10): qty 1

## 2013-07-20 MED ORDER — ASPIRIN 81 MG PO CHEW
81.0000 mg | CHEWABLE_TABLET | Freq: Every day | ORAL | Status: DC
  Administered 2013-07-20 – 2013-07-29 (×10): 81 mg via ORAL
  Filled 2013-07-20 (×10): qty 1

## 2013-07-20 MED ORDER — SODIUM CHLORIDE 0.9 % IV SOLN
INTRAVENOUS | Status: DC
  Administered 2013-07-20: 14:00:00 via INTRAVENOUS

## 2013-07-20 MED ORDER — ALBUTEROL SULFATE (2.5 MG/3ML) 0.083% IN NEBU
2.5000 mg | INHALATION_SOLUTION | Freq: Once | RESPIRATORY_TRACT | Status: AC
  Administered 2013-07-20: 2.5 mg via RESPIRATORY_TRACT
  Filled 2013-07-20: qty 3

## 2013-07-20 NOTE — Evaluation (Signed)
Physical Therapy Evaluation Patient Details Name: Renee Donovan MRN: [id number] DOB: 1931-03-06 Today's Date: 07/20/2013   History of Present Illness  Pt is an 80 year old female admitted 4/23 for nausea and vomiting with history of COPD, hypothyroidism, chronic alcoholism, tobacco abuse, history of breast cancer, hypertension.  Pt also with toe ulcers suspicious for PVD per chart review.  Pt usually on 2L O2 at home and currently on 4L O2 in room.  Clinical Impression  Pt currently with functional limitations due to the deficits listed below (see PT Problem List). Pt will benefit from skilled PT to increase their independence and safety with mobility to allow discharge to the venue listed below.  Pt with significant DOE during ambulation today and required increase to 5L O2.  Pt required mod assist for LOB x2 during gait and drifted to mainly R side, running into objects despite verbal cues.  RT called end of session for breathing tx.  Discussed recommendation for ST-SNF upon d/c.     Follow Up Recommendations SNF;Supervision for mobility/OOB    Equipment Recommendations  None recommended by PT    Recommendations for Other Services       Precautions / Restrictions Precautions Precautions: Fall Precaution Comments: chronic O2, check sats      Mobility  Bed Mobility Overal bed mobility: Needs Assistance Bed Mobility: Supine to Sit;Sit to Supine     Supine to sit: Min assist Sit to supine: Min assist   General bed mobility comments: assist for trunk upright and LEs into bed due to fatigue, SOB after ambulation  Transfers Overall transfer level: Needs assistance Equipment used: Rolling walker (2 wheeled) Transfers: Sit to/from Stand Sit to Stand: Min assist         General transfer comment: verbal cues for safety, assist to steady and control descent  Ambulation/Gait Ambulation/Gait assistance: Mod assist Ambulation Distance (Feet): 50 Feet Assistive device:  Rolling walker (2 wheeled) Gait Pattern/deviations: Step-through pattern;Trunk flexed;Drifts right/left     General Gait Details: drifts mostly to R, running into objects on R side despite verbal cues (states vision is fine), assist x2 to correct LOB, ambulated on 4L O2 and pt extremely SOB, SpO2 83% upon sitting on bed - did not rise with pursed lip breathing so increased to 5L  Stairs            Wheelchair Mobility    Modified Rankin (Stroke Patients Only)       Balance                                             Pertinent Vitals/Pain Ambulated on 4L O2, pt with DOE SpO2 end of ambulation 83% on 4L which did not increase with 5 min rest and pursed lip breathing so increased to 5L with SPO2 86% and RT called for breathing tx (also aware of increase from 4L to 5L)    Home Living Family/patient expects to be discharged to:: Private residence Living Arrangements: Alone   Type of Home: House Home Access: Stairs to enter     Home Layout: One level Home Equipment: Wheelchair - [employer] - 4 wheels      Prior Function Level of Independence: Independent with assistive device(s)         Comments: pt reports mod I with RW, on 2L O2 at home, short distances     Hand Dominance  Extremity/Trunk Assessment               Lower Extremity Assessment: Generalized weakness         Communication   Communication: No difficulties  Cognition Arousal/Alertness: Awake/alert Behavior During Therapy: WFL for tasks assessed/performed Overall Cognitive Status: Within Functional Limits for tasks assessed                      General Comments      Exercises        Assessment/Plan    PT Assessment Patient needs continued PT services  PT Diagnosis Difficulty walking   PT Problem List Decreased strength;Decreased activity tolerance;Decreased mobility;Cardiopulmonary status limiting activity;Decreased knowledge of use of  DME;Decreased safety awareness  PT Treatment Interventions Gait training;DME instruction;Balance training;Neuromuscular re-education;Therapeutic activities;Therapeutic exercise;Patient/family education;Functional mobility training   PT Goals (Current goals can be found in the Care Plan section) Acute Rehab PT Goals PT Goal Formulation: With patient Time For Goal Achievement: 07/27/13 Potential to Achieve Goals: Good    Frequency Min 3X/week   Barriers to discharge        Co-evaluation               End of Session Equipment Utilized During Treatment: Gait belt;Oxygen Activity Tolerance: Patient limited by fatigue;Other (comment) (DOE) Patient left: in bed;with call bell/phone within reach;with family/visitor present           Time: 6761-9509 PT Time Calculation (min): 19 min   Charges:   PT Evaluation $Initial PT Evaluation Tier I: 1 Procedure PT Treatments $Gait Training: 8-22 mins   PT G CodesJunius Argyle 07/20/2013, 1:48 PM Carmelia Bake, PT, DPT 07/20/2013 Pager: [phone]

## 2013-07-20 NOTE — Progress Notes (Signed)
ANTIBIOTIC CONSULT NOTE - FOLLOW UP  Pharmacy Consult for ceftriaxone Indication: UTI  No Known Allergies  Patient Measurements: Height: 5\' 6"  (167.6 cm) Weight: 169 lb 14.4 oz (77.066 kg) IBW/kg (Calculated) : 59.3 Adjusted Body Weight:   Vital Signs: Temp: 97.9 F (36.6 C) (04/25 8657) Temp src: Oral (04/25 0613) BP: 144/71 mmHg (04/25 0613) Pulse Rate: 64 (04/25 0613) Intake/Output from previous day:   Intake/Output from this shift:    Labs:  Recent Labs  07/18/13 1322 07/19/13 0434 07/20/13 0520  WBC 12.1* 15.4*  --   HGB 14.8 12.8  --   PLT 253 248  --   CREATININE 0.94 1.72* 1.70*   Estimated Creatinine Clearance: 27.2 ml/min (by C-G formula based on Cr of 1.7). No results found for this basename: Letta Median, VANCORANDOM, GENTTROUGH, GENTPEAK, GENTRANDOM, TOBRATROUGH, TOBRAPEAK, TOBRARND, AMIKACINPEAK, AMIKACINTROU, AMIKACIN,  in the last 72 hours   Microbiology: Recent Results (from the past 720 hour(s))  URINE CULTURE     Status: None   Collection Time    07/18/13  6:25 PM      Result Value Ref Range Status   Specimen Description URINE, CLEAN CATCH   Final   Special Requests Normal   Final   Culture  Setup Time     Final   Value: 07/18/2013 22:05     Performed at [place]     Final   Value: 8,000 COLONIES/ML     Performed at [facility]   Culture     Final   Value: INSIGNIFICANT GROWTH     Performed at [facility]   Report Status 07/20/2013 FINAL   Final    Anti-infectives   Start     Dose/Rate Route Frequency Ordered Stop   07/18/13 1830  cefTRIAXone (ROCEPHIN) 1 g in dextrose 5 % 50 mL IVPB     1 g 100 mL/hr over 30 Minutes Intravenous Every 24 hours 07/18/13 1816     07/18/13 0000  cephALEXin (KEFLEX) 500 MG capsule     500 mg Oral 4 times daily 07/18/13 1819        Assessment: 47 yof presented to ED with vomiting & weakness. Pharmacy consulted to assist in dosing rocephin for  possible UTI  4/23 >>Rocephin >>  Tmax: AFeb WBCs:15.4 Renal: Scr 1.7 ~38mlmin CG  4/23 urine: 8K insignificant growth  Drug level / dose changes info:  Goal of Therapy:  Dose per patient-specific parameters  Plan:   Continue ceftriaxone 1gm IV q24h  No dose adjustment needed and pharmacy will follow at distance  Doreene Eland, PharmD, BCPS.   Pager: [phone]  07/20/2013,10:52 AM

## 2013-07-20 NOTE — Progress Notes (Addendum)
[facility] Progress Note   Renee Donovan ERX:[id number] DOB: Sep 06, 1930 DOA: 07/18/2013 PCP: Renee Hare, MD  Brief narrative: Renee Donovan is a 80 y.o. female presenting on 07/18/2013 with  with history of COPD, hypothyroidism, chronic alcoholism, tobacco abuse, history of breast cancer, hypertension started experiencing nausea vomiting and left flank pain on 4/23.    Subjective: Feeling better. No nausea or diarrhea. Somewhat confused.   Assessment/Plan: Principal Problem:   Nausea and vomiting- possibly gastroenteritis? - PRN Zofran - advance to full liquids  Active Problems: UTI - mild -  culture negative - d/c Rocephin  ETOH abuse - CIWA scale ordered    Hypothyroidism - cont levothyroxine    COPD (chronic obstructive pulmonary disease) with emphysema - stable    Toe ulcer/PVD - add ASA and statin - ABI low - outpt vascular surgery eval - advised to stop smoking     Accelerated hypertension - resolved - cont Clonidine patch and Verapamil - zestoretic on hold  ARF - due to dehydration vs malignant HTN - cont IVF    Code Status: full code Family Communication: with daughter in law today Disposition Plan: home or  SNF in 1-2 days  Consultants: none  Procedures: none  Antibiotics: Antibiotics Given (last 72 hours)   None       DVT prophylaxis: Lovenox  Objective: Filed Weights   07/18/13 2220  Weight: 77.066 kg (169 lb 14.4 oz)   Blood pressure 121/48, pulse 65, temperature 97.6 F (36.4 C), temperature source Axillary, resp. rate 20, height 5\' 6"  (1.676 m), weight 77.066 kg (169 lb 14.4 oz), SpO2 100.00%.  Intake/Output Summary (Last 24 hours) at 07/20/13 1307 Last data filed at 07/20/13 1054  Gross per 24 hour  Intake    360 ml  Output      0 ml  Net    360 ml     Exam: General: No acute respiratory distress Lungs: Clear to auscultation bilaterally without wheezes or crackles Cardiovascular: Regular rate  and rhythm without murmur gallop or rub normal S1 and S2 Abdomen: Nontender, nondistended, soft, bowel sounds positive, no rebound, no ascites, no appreciable mass Extremities: No significant cyanosis, clubbing, or edema bilateral lower extremities  Data Reviewed: Basic Metabolic Panel:  Recent Labs Lab 07/18/13 1322 07/19/13 0434 07/20/13 0520  NA 143 140 137  K 4.2 3.9 3.7  CL 98 97 97  CO2 31 29 27   GLUCOSE 133* 95 75  BUN 16 26* 30*  CREATININE 0.94 1.72* 1.70*  CALCIUM 9.5 8.7 9.0   Liver Function Tests:  Recent Labs Lab 07/18/13 1322 07/19/13 0434  AST 20 14  ALT 14 10  ALKPHOS 86 65  BILITOT 1.5* 0.9  PROT 7.7 6.1  ALBUMIN 4.1 3.4*    Recent Labs Lab 07/18/13 1322  LIPASE 30   No results found for this basename: AMMONIA,  in the last 168 hours CBC:  Recent Labs Lab 07/18/13 1322 07/19/13 0434  WBC 12.1* 15.4*  NEUTROABS 10.2* 11.9*  HGB 14.8 12.8  HCT 44.4 38.5  MCV 93.1 93.9  PLT 253 248   Cardiac Enzymes:  Recent Labs Lab 07/18/13 1322  TROPONINI <0.30   BNP (last 3 results) No results found for this basename: PROBNP,  in the last 8760 hours CBG:  Recent Labs Lab 07/19/13 0721 07/19/13 1212 07/19/13 1752 07/20/13 0007 07/20/13 0609  GLUCAP 87 89 81 78 83    Recent Results (from the past 240 hour(s))  URINE CULTURE  Status: None   Collection Time    07/18/13  6:25 PM      Result Value Ref Range Status   Specimen Description URINE, CLEAN CATCH   Final   Special Requests Normal   Final   Culture  Setup Time     Final   Value: 07/18/2013 22:05     Performed at [facility]     Final   Value: 8,000 COLONIES/ML     Performed at [facility]   Culture     Final   Value: INSIGNIFICANT GROWTH     Performed at [facility]   Report Status 07/20/2013 FINAL   Final     Studies:  Recent x-ray studies have been reviewed in detail by the Attending Physician  Scheduled  Meds:  Scheduled Meds: . albuterol  2.5 mg Nebulization BID  . buPROPion  300 mg Oral Daily  . cefTRIAXone (ROCEPHIN)  IV  1 g Intravenous Q24H  . cloNIDine  0.1 mg Transdermal Weekly  . enoxaparin (LOVENOX) injection  30 mg Subcutaneous Q24H  . folic acid  1 mg Oral Daily  . levothyroxine  50 mcg Oral QAC breakfast  . LORazepam  0-4 mg Intravenous Q6H   Followed by  . LORazepam  0-4 mg Intravenous Q12H  . lovastatin  40 mg Oral QHS  . multivitamin with minerals  1 tablet Oral Daily  . thiamine  100 mg Oral Daily   Or  . thiamine  100 mg Intravenous Daily  . tiotropium  18 mcg Inhalation Daily  . verapamil  240 mg Oral BID   Continuous Infusions: . sodium chloride      Time spent on care of this patient: 30 min   Debbe Odea, MD 07/20/2013, 1:07 PM  LOS: 2 days   [facility] Office  [phone] Pager - Text Page per Shea Evans   If 7PM-7AM, please contact night-coverage [email or web address]

## 2013-07-21 ENCOUNTER — Inpatient Hospital Stay (HOSPITAL_COMMUNITY): Payer: Medicare Other

## 2013-07-21 DIAGNOSIS — J9620 Acute and chronic respiratory failure, unspecified whether with hypoxia or hypercapnia: Secondary | ICD-10-CM

## 2013-07-21 DIAGNOSIS — N179 Acute kidney failure, unspecified: Principal | ICD-10-CM

## 2013-07-21 LAB — BASIC METABOLIC PANEL
BUN: 21 mg/dL (ref 6–23)
CO2: 26 mEq/L (ref 19–32)
Calcium: 8.9 mg/dL (ref 8.4–10.5)
Chloride: 100 mEq/L (ref 96–112)
Creatinine, Ser: 1.15 mg/dL — ABNORMAL HIGH (ref 0.50–1.10)
GFR calc Af Amer: 50 mL/min — ABNORMAL LOW (ref >90)
GFR calc non Af Amer: 43 mL/min — ABNORMAL LOW (ref >90)
GLUCOSE: 99 mg/dL (ref 70–99)
POTASSIUM: 4.4 meq/L (ref 3.7–5.3)
SODIUM: 137 meq/L (ref 137–147)

## 2013-07-21 LAB — BLOOD GAS, ARTERIAL
Acid-base deficit: 0.3 mmol/L (ref 0.0–2.0)
Bicarbonate: 25.9 mEq/L — ABNORMAL HIGH (ref 20.0–24.0)
Drawn by: 276051
FIO2: 0.31 %
O2 Saturation: 88.3 %
PATIENT TEMPERATURE: 98.6
PH ART: 7.322 — AB (ref 7.350–7.450)
TCO2: 23.7 mmol/L (ref 0–100)
pCO2 arterial: 51.6 mmHg — ABNORMAL HIGH (ref 35.0–45.0)
pO2, Arterial: 58.7 mmHg — ABNORMAL LOW (ref 80.0–100.0)

## 2013-07-21 LAB — GLUCOSE, CAPILLARY
Glucose-Capillary: 123 mg/dL — ABNORMAL HIGH (ref 70–99)
Glucose-Capillary: 134 mg/dL — ABNORMAL HIGH (ref 70–99)

## 2013-07-21 LAB — MRSA PCR SCREENING: MRSA by PCR: NEGATIVE

## 2013-07-21 MED ORDER — ENOXAPARIN SODIUM 40 MG/0.4ML ~~LOC~~ SOLN
40.0000 mg | SUBCUTANEOUS | Status: DC
  Administered 2013-07-21 – 2013-07-23 (×3): 40 mg via SUBCUTANEOUS
  Filled 2013-07-21 (×4): qty 0.4

## 2013-07-21 MED ORDER — METHYLPREDNISOLONE SODIUM SUCC 40 MG IJ SOLR
INTRAMUSCULAR | Status: AC
  Filled 2013-07-21: qty 1

## 2013-07-21 MED ORDER — FUROSEMIDE 10 MG/ML IJ SOLN
20.0000 mg | Freq: Once | INTRAMUSCULAR | Status: AC
  Administered 2013-07-21: 20 mg via INTRAVENOUS
  Filled 2013-07-21: qty 2

## 2013-07-21 MED ORDER — METHYLPREDNISOLONE SODIUM SUCC 125 MG IJ SOLR
125.0000 mg | Freq: Three times a day (TID) | INTRAMUSCULAR | Status: DC
  Administered 2013-07-21 – 2013-07-22 (×4): 125 mg via INTRAVENOUS
  Filled 2013-07-21 (×8): qty 2

## 2013-07-21 MED ORDER — METHYLPREDNISOLONE SODIUM SUCC 40 MG IJ SOLR
INTRAMUSCULAR | Status: DC
Start: 2013-07-21 — End: 2013-07-21
  Filled 2013-07-21: qty 1

## 2013-07-21 NOTE — Progress Notes (Signed)
OT Cancellation Note  Patient Details Name: Renee Donovan MRN: [id number] DOB: Aug 25, 1930   Cancelled Treatment:    Reason Eval/Treat Not Completed: Medical issues which prohibited therapy (pt transferred to ICU this am; will follow up for OT eval as indicated)  Tywanna Seifer A Kaede Clendenen 07/21/2013, 9:26 AM

## 2013-07-21 NOTE — Progress Notes (Signed)
Pt noted to have increased work of breathing, audible wheezing, decreased breath sounds on auscultation, decreased LOC, confused, O2 sat 80% on 5L O2; respiratory therapy called, breathing treatment given; Dr. Wynelle Cleveland called to bedside; chest x-ray obtained and solumedrol 125mg  given IV; pt placed on 100% non-rebreather and then weaned to partial rebreather w/ O2 sats in the 90's.  Report called to [place] RN in ICU and pt transferred to 1238.

## 2013-07-21 NOTE — Progress Notes (Addendum)
[facility] Progress Note   Renee Donovan WGN:[id number] DOB: 07-Jun-1930 DOA: 07/18/2013 PCP: [name], MD  Brief narrative: Renee Donovan is a 80 y.o. female presenting on 07/18/2013 with  with history of COPD, hypothyroidism, chronic alcoholism, tobacco abuse, history of breast cancer, hypertension started experiencing nausea vomiting and left flank pain on 4/23.    Subjective: Called for dyspnea. Patient using accessory muscles. She was given a neb treatment and although she tells me she feels better, she is is still using her accessory muscles.   Assessment/Plan: Principal Problem: Acute respiratory failure- COPD exacerbation and mild fluid overload - improving with nebs but will need a BiPAP due to her distress - will add a dose of Solumedrol to control her wheezing and increase air movement - CXR reveals edema therefore will need to stop IVF and give a dose of Lasix - transfer to SDU and monitor closely - was able to be weaned down to 31 % on a venti mask before being placed on a BiPAP    Nausea and vomiting- possibly gastroenteritis? - PRN Zofran - advanced to full liquids yesterday  Active Problems: UTI - mild on UA -  culture negative - d/c Rocephin  ETOH abuse - CIWA scale ordered- last dose of Ativan given at 321 AM which was PRN dose - it was not based on CIWA scale as her scale has been quite low- will d/c CIWA as she is not undergoing any significant withdrawl    Hypothyroidism - cont levothyroxine    Toe ulcer/PVD - added ASA and statin - ABIs quite low - outpt vascular surgery eval - advised to stop smoking     Accelerated hypertension - resolved - cont Clonidine patch and Verapamil - zestoretic on hold  ARF - improving with hydration - due to dehydration vs malignant HTN   Code Status: full code Family Communication: with daughter in law today Disposition Plan: home or  SNF in 1-2  days  Consultants: none  Procedures: none  Antibiotics: Antibiotics Given (last 72 hours)   None       DVT prophylaxis: Lovenox  Objective: Filed Weights   07/18/13 2220  Weight: 77.066 kg (169 lb 14.4 oz)   Blood pressure 171/68, pulse 82, temperature 98.1 F (36.7 C), temperature source Axillary, resp. rate 20, height 5\' 6"  (1.676 m), weight 77.066 kg (169 lb 14.4 oz), SpO2 96.00%.  Intake/Output Summary (Last 24 hours) at 07/21/13 0937 Last data filed at 07/20/13 1838  Gross per 24 hour  Intake    600 ml  Output      0 ml  Net    600 ml     Exam: General: + acute respiratory distress Lungs: Wheezing with poor air movement, using accessory muscles- tachypneic Cardiovascular: Regular rate and rhythm without murmur gallop or rub normal S1 and S2 Abdomen: Nontender, nondistended, soft, bowel sounds positive, no rebound, no ascites, no appreciable mass Extremities: No significant cyanosis, clubbing, or edema bilateral lower extremities  Data Reviewed: Basic Metabolic Panel:  Recent Labs Lab 07/18/13 1322 07/19/13 0434 07/20/13 0520 07/21/13 0806  NA 143 140 137 137  K 4.2 3.9 3.7 4.4  CL 98 97 97 100  CO2 31 29 27 26   GLUCOSE 133* 95 75 99  BUN 16 26* 30* 21  CREATININE 0.94 1.72* 1.70* 1.15*  CALCIUM 9.5 8.7 9.0 8.9   Liver Function Tests:  Recent Labs Lab 07/18/13 1322 07/19/13 0434  AST 20 14  ALT 14 10  ALKPHOS 86 65  BILITOT 1.5* 0.9  PROT 7.7 6.1  ALBUMIN 4.1 3.4*    Recent Labs Lab 07/18/13 1322  LIPASE 30   No results found for this basename: AMMONIA,  in the last 168 hours CBC:  Recent Labs Lab 07/18/13 1322 07/19/13 0434 07/20/13 1315  WBC 12.1* 15.4* 11.1*  NEUTROABS 10.2* 11.9*  --   HGB 14.8 12.8 12.4  HCT 44.4 38.5 37.0  MCV 93.1 93.9 94.9  PLT 253 248 212   Cardiac Enzymes:  Recent Labs Lab 07/18/13 1322  TROPONINI <0.30   BNP (last 3 results) No results found for this basename: PROBNP,  in the last  8760 hours CBG:  Recent Labs Lab 07/19/13 1752 07/20/13 0007 07/20/13 0609 07/20/13 2349 07/21/13 0501  GLUCAP 81 78 83 104* 123*    Recent Results (from the past 240 hour(s))  URINE CULTURE     Status: None   Collection Time    07/18/13  6:25 PM      Result Value Ref Range Status   Specimen Description URINE, CLEAN CATCH   Final   Special Requests Normal   Final   Culture  Setup Time     Final   Value: 07/18/2013 22:05     Performed at [facility]     Final   Value: 8,000 COLONIES/ML     Performed at [facility]   Culture     Final   Value: INSIGNIFICANT GROWTH     Performed at [facility]   Report Status 07/20/2013 FINAL   Final     Studies:  Recent x-ray studies have been reviewed in detail by the Attending Physician  Scheduled Meds:  Scheduled Meds: . albuterol  2.5 mg Nebulization BID  . aspirin  81 mg Oral Daily  . atorvastatin  10 mg Oral q1800  . buPROPion  300 mg Oral Daily  . cloNIDine  0.1 mg Transdermal Weekly  . enoxaparin (LOVENOX) injection  30 mg Subcutaneous Q24H  . folic acid  1 mg Oral Daily  . furosemide  20 mg Intravenous Once  . levothyroxine  50 mcg Oral QAC breakfast  . LORazepam  0-4 mg Intravenous Q12H  . lovastatin  40 mg Oral QHS  . methylPREDNISolone (SOLU-MEDROL) injection  125 mg Intravenous 3 times per day  . methylPREDNISolone sodium succinate      . methylPREDNISolone sodium succinate      . methylPREDNISolone sodium succinate      . multivitamin with minerals  1 tablet Oral Daily  . thiamine  100 mg Oral Daily   Or  . thiamine  100 mg Intravenous Daily  . tiotropium  18 mcg Inhalation Daily  . verapamil  240 mg Oral BID   Continuous Infusions:    Time spent on care of this patient: 30 min   Debbe Odea, MD 07/21/2013, 9:37 AM  LOS: 3 days   [facility] Office  [phone] Pager - Text Page per Shea Evans   If 7PM-7AM, please contact  night-coverage [email or web address]

## 2013-07-22 ENCOUNTER — Inpatient Hospital Stay (HOSPITAL_COMMUNITY): Payer: Medicare Other

## 2013-07-22 DIAGNOSIS — F17200 Nicotine dependence, unspecified, uncomplicated: Secondary | ICD-10-CM

## 2013-07-22 DIAGNOSIS — I059 Rheumatic mitral valve disease, unspecified: Secondary | ICD-10-CM

## 2013-07-22 DIAGNOSIS — I10 Essential (primary) hypertension: Secondary | ICD-10-CM

## 2013-07-22 LAB — BASIC METABOLIC PANEL
BUN: 25 mg/dL — AB (ref 6–23)
CALCIUM: 9.3 mg/dL (ref 8.4–10.5)
CO2: 25 meq/L (ref 19–32)
Chloride: 97 mEq/L (ref 96–112)
Creatinine, Ser: 1.3 mg/dL — ABNORMAL HIGH (ref 0.50–1.10)
GFR calc Af Amer: 43 mL/min — ABNORMAL LOW (ref >90)
GFR, EST NON AFRICAN AMERICAN: 37 mL/min — AB (ref >90)
GLUCOSE: 147 mg/dL — AB (ref 70–99)
Potassium: 4 mEq/L (ref 3.7–5.3)
Sodium: 136 mEq/L — ABNORMAL LOW (ref 137–147)

## 2013-07-22 LAB — CBC
HCT: 38.6 % (ref 36.0–46.0)
HEMOGLOBIN: 12.9 g/dL (ref 12.0–15.0)
MCH: 31.1 pg (ref 26.0–34.0)
MCHC: 33.4 g/dL (ref 30.0–36.0)
MCV: 93 fL (ref 78.0–100.0)
PLATELETS: 238 10*3/uL (ref 150–400)
RBC: 4.15 MIL/uL (ref 3.87–5.11)
RDW: 13.8 % (ref 11.5–15.5)
WBC: 12.8 10*3/uL — ABNORMAL HIGH (ref 4.0–10.5)

## 2013-07-22 LAB — GLUCOSE, CAPILLARY
Glucose-Capillary: 139 mg/dL — ABNORMAL HIGH (ref 70–99)
Glucose-Capillary: 157 mg/dL — ABNORMAL HIGH (ref 70–99)
Glucose-Capillary: 209 mg/dL — ABNORMAL HIGH (ref 70–99)
Glucose-Capillary: 173 mg/dL — ABNORMAL HIGH (ref 70–99)

## 2013-07-22 MED ORDER — METHYLPREDNISOLONE SODIUM SUCC 40 MG IJ SOLR
40.0000 mg | Freq: Three times a day (TID) | INTRAMUSCULAR | Status: DC
  Administered 2013-07-22 – 2013-07-24 (×6): 40 mg via INTRAVENOUS
  Filled 2013-07-22 (×9): qty 1

## 2013-07-22 MED ORDER — HYDRALAZINE HCL 20 MG/ML IJ SOLN
10.0000 mg | Freq: Once | INTRAMUSCULAR | Status: AC
  Administered 2013-07-22: 10 mg via INTRAVENOUS
  Filled 2013-07-22: qty 1

## 2013-07-22 MED ORDER — PERFLUTREN LIPID MICROSPHERE
1.0000 mL | INTRAVENOUS | Status: AC | PRN
  Administered 2013-07-22: 2 mL via INTRAVENOUS
  Filled 2013-07-22: qty 10

## 2013-07-22 MED ORDER — FUROSEMIDE 10 MG/ML IJ SOLN
20.0000 mg | Freq: Four times a day (QID) | INTRAMUSCULAR | Status: AC
  Administered 2013-07-22 (×2): 20 mg via INTRAVENOUS
  Filled 2013-07-22 (×2): qty 2

## 2013-07-22 NOTE — Progress Notes (Addendum)
Clinical Social Work Department CLINICAL SOCIAL WORK PLACEMENT NOTE 07/22/2013  Patient:  Renee Donovan, Renee Donovan  Account Number:  [id number] Admit date:  07/18/2013  Clinical Social Worker:  Ulyess Blossom  Date/time:  07/22/2013 11:00 AM  Clinical Social Work is seeking post-discharge placement for this patient at the following level of care:   [place]   (*CSW will update this form in Epic as items are completed)   07/22/2013  Patient/family provided with [place] Department of Clinical Social Work's list of facilities offering this level of care within the geographic area requested by the patient (or if unable, by the patient's family).  07/22/2013  Patient/family informed of their freedom to choose among providers that offer the needed level of care, that participate in Medicare, Medicaid or managed care program needed by the patient, have an available bed and are willing to accept the patient.  07/22/2013  Patient/family informed of [facility]' ownership interest in [facility], as well as of the fact that they are under no obligation to receive care at this facility.  PASARR submitted to EDS on 07/22/2013 PASARR number received from EDS on 07/22/2013  FL2 transmitted to all facilities in geographic area requested by pt/family on  07/22/2013 FL2 transmitted to all facilities within larger geographic area on   Patient informed that his/her managed care company has contracts with or will negotiate with  certain facilities, including the following:     Patient/family informed of bed offers received:  07/22/2013 Patient chooses bed at [facility] Physician recommends and patient chooses bed at    Patient to be transferred to  on  [place] on 07/29/2013- Patient to be transferred to facility by ambulance Corey Harold)  The following physician request were entered in Epic:   Additional Comments:    Alison Murray, MSW, [place] Work [phone]

## 2013-07-22 NOTE — Progress Notes (Addendum)
CSW continuing to follow.  CSW received handoff from [place], Sindy Messing regarding that pt initially refusing SNF placement for rehab.  CSW reviewed chart and noted that PT evaluation from 4/25 recommends SNF and MD not from today states that plan is for SNF.  CSW visited pt room and initially pt was receiving ECHO. CSW spoke with RN who reports that MD spoke to pt this morning regarding need for rehab at [facility] and pt now agreeable to rehab at [facility].  CSW met with pt following completion of ECHO. Pt grandson present at this time and pt agreeable to pt grandson involvement. CSW introduced self and explained role. CSW explained to pt that CSW understanding is that MD is strongly recommending rehab at [facility]. Pt confirmed and stated that she is agreeable to exploring SNF options. Pt agreeable to [facility] search and is hopeful for [facility] as it is convenient to pt grandson who is a strong support to pt. CSW discussed process of SNF search and explained that CSW will contact [facility] directly to notify of pt interest. Pt expressed appreciation. Pt agreed for CSW to return with SNF bed offers, but is also agreeable to CSW contacted pt grandson via telephone if pt grandson not present at bedside when CSW returns with SNF bed offers.   CSW completed FL2 and initiated SNF search to [facility]. CSW contacted [facility] and notified facility of pt and pt family interest in facility. [facility] plans to review clinical and will notify this CSW if facility able to offer a bed.  CSW to follow up with pt and pt grandson regarding SNF bed offers.   CSW to continue to follow to assist with pt disposition needs.   Addendum 12:00 pm:  CSW received notification from [facility] that facility is able to offer pt a bed, but pt must be on nasal cannula O2 before transition to SNF.  CSW  met with pt and pt grandson at bedside. CSW notified pt and pt grandson that [facility] offered pt a bed. Pt and pt grandson accepting of bed offer and relieved that pt will be able to be close to family during rehab stay. CSW clarified questions and concerns.  CSW to continue to follow to assist with disposition need to [facility] once pt medically stable for discharge.  Alison Murray, MSW, [place] Work [phone]

## 2013-07-22 NOTE — Progress Notes (Signed)
OT Cancellation Note  Patient Details Name: MAANVI LECOMPTE MRN: [id number] DOB: [birth year]   Cancelled Treatment:    Pt had just gotten to chair with nursing. Will recheck on pt next day for OT eval.   Thanks, Kari Baars, OT Betsy Pries 07/22/2013, 1:52 PM

## 2013-07-22 NOTE — Progress Notes (Addendum)
[facility] Progress Note   Renee Donovan HEN:[id number] DOB: 28-Sep-1930 DOA: 07/18/2013 PCP: [name], MD  Brief narrative: Renee Donovan is a 80 y.o. female presenting on 07/18/2013 with  with history of COPD, hypothyroidism, chronic alcoholism, tobacco abuse, history of breast cancer, hypertension started experiencing nausea vomiting and left flank pain on 4/23.    Subjective: Continues to be breathing fast - no cough  Assessment/Plan: Principal Problem: Acute respiratory failure- COPD exacerbation and mild fluid overload - improving with nebs- cont  BiPAP PRN today  - cont Solumedrol - CXR continues to reveal mild edema therefore give 2 more doses of Lasix- she is incontinent of urine therefore, cannot measure I and Os- check ECHO - cont to follow in SDU and monitor closely     Nausea and vomiting- possibly gastroenteritis? - resolved - advanced to full liquids 2 days ago and has been doing well with them -will not advance further as she may cont to need BiPAP throughout the day  Active Problems: UTI?? - mild on UA -  culture negative - d/c Rocephin  ETOH abuse - CIWA scale ordered- has had mild withdrawal symptoms     Hypothyroidism - cont levothyroxine    Toe ulcer/PVD - added ASA and statin - ABIs quite low - outpt vascular surgery eval - advised to stop smoking     Accelerated hypertension - cont Clonidine patch and Verapamil  ARF - improving with hydration and now mildly overhydrated - due to dehydration vs malignant HTN   Code Status: full code Family Communication: with daughter in law  Disposition Plan:  SNF in 1-2 days  Consultants: none  Procedures: none  Antibiotics: Antibiotics Given (last 72 hours)   None       DVT prophylaxis: Lovenox  Objective: Filed Weights   07/18/13 2220 07/21/13 0840 07/22/13 0400  Weight: 77.066 kg (169 lb 14.4 oz) 78.9 kg (173 lb 15.1 oz) 80.1 kg (176 lb 9.4 oz)   Blood pressure  169/69, pulse 75, temperature 97.7 F (36.5 C), temperature source Oral, resp. rate 28, height 5\' 6"  (1.676 m), weight 80.1 kg (176 lb 9.4 oz), SpO2 95.00%.  Intake/Output Summary (Last 24 hours) at 07/22/13 0913 Last data filed at 07/22/13 0800  Gross per 24 hour  Intake     20 ml  Output      0 ml  Net     20 ml     Exam: General: AAO x 3 Lungs: no further wheezing- clear lungs- tachypneic but no use of accessory muscles Cardiovascular: Regular rate and rhythm without murmur gallop or rub normal S1 and S2 Abdomen: Nontender, nondistended, soft, bowel sounds positive, no rebound, no ascites, no appreciable mass Extremities: No significant cyanosis, clubbing, or edema bilateral lower extremities  Data Reviewed: Basic Metabolic Panel:  Recent Labs Lab 07/18/13 1322 07/19/13 0434 07/20/13 0520 07/21/13 0806  NA 143 140 137 137  K 4.2 3.9 3.7 4.4  CL 98 97 97 100  CO2 31 29 27 26   GLUCOSE 133* 95 75 99  BUN 16 26* 30* 21  CREATININE 0.94 1.72* 1.70* 1.15*  CALCIUM 9.5 8.7 9.0 8.9   Liver Function Tests:  Recent Labs Lab 07/18/13 1322 07/19/13 0434  AST 20 14  ALT 14 10  ALKPHOS 86 65  BILITOT 1.5* 0.9  PROT 7.7 6.1  ALBUMIN 4.1 3.4*    Recent Labs Lab 07/18/13 1322  LIPASE 30   No results found for this basename: AMMONIA,  in  the last 168 hours CBC:  Recent Labs Lab 07/18/13 1322 07/19/13 0434 07/20/13 1315  WBC 12.1* 15.4* 11.1*  NEUTROABS 10.2* 11.9*  --   HGB 14.8 12.8 12.4  HCT 44.4 38.5 37.0  MCV 93.1 93.9 94.9  PLT 253 248 212   Cardiac Enzymes:  Recent Labs Lab 07/18/13 1322  TROPONINI <0.30   BNP (last 3 results) No results found for this basename: PROBNP,  in the last 8760 hours CBG:  Recent Labs Lab 07/20/13 0609 07/20/13 2349 07/21/13 0501 07/21/13 1155 07/22/13 0533  GLUCAP 83 104* 123* 134* 139*    Recent Results (from the past 240 hour(s))  URINE CULTURE     Status: None   Collection Time    07/18/13  6:25 PM       Result Value Ref Range Status   Specimen Description URINE, CLEAN CATCH   Final   Special Requests Normal   Final   Culture  Setup Time     Final   Value: 07/18/2013 22:05     Performed at [place]     Final   Value: 8,000 COLONIES/ML     Performed at [facility]   Culture     Final   Value: INSIGNIFICANT GROWTH     Performed at [facility]   Report Status 07/20/2013 FINAL   Final  MRSA PCR SCREENING     Status: None   Collection Time    07/21/13  8:48 AM      Result Value Ref Range Status   MRSA by PCR NEGATIVE  NEGATIVE Final   Comment:            The GeneXpert MRSA Assay (FDA     approved for NASAL specimens     only), is one component of a     comprehensive MRSA colonization     surveillance program. It is not     intended to diagnose MRSA     infection nor to guide or     monitor treatment for     MRSA infections.     Studies:  Recent x-ray studies have been reviewed in detail by the Attending Physician  Scheduled Meds:  Scheduled Meds: . albuterol  2.5 mg Nebulization BID  . aspirin  81 mg Oral Daily  . atorvastatin  10 mg Oral q1800  . buPROPion  300 mg Oral Daily  . cloNIDine  0.1 mg Transdermal Weekly  . enoxaparin (LOVENOX) injection  40 mg Subcutaneous Q24H  . folic acid  1 mg Oral Daily  . furosemide  20 mg Intravenous Q6H  . levothyroxine  50 mcg Oral QAC breakfast  . LORazepam  0-4 mg Intravenous Q12H  . lovastatin  40 mg Oral QHS  . methylPREDNISolone (SOLU-MEDROL) injection  40 mg Intravenous 3 times per day  . multivitamin with minerals  1 tablet Oral Daily  . thiamine  100 mg Oral Daily   Or  . thiamine  100 mg Intravenous Daily  . tiotropium  18 mcg Inhalation Daily  . verapamil  240 mg Oral BID   Continuous Infusions:    Time spent on care of this patient: 30 min   Debbe Odea, MD 07/22/2013, 9:13 AM  LOS: 4 days   [facility] Office  [phone] Pager - Text Page per Shea Evans    If 7PM-7AM, please contact night-coverage [email or web address]

## 2013-07-22 NOTE — Progress Notes (Signed)
Echocardiogram 2D Echocardiogram Definity has been performed.  Renee Donovan 07/22/2013, 11:59 AM

## 2013-07-22 NOTE — Progress Notes (Signed)
PT Cancellation Note  Patient Details Name: Renee Donovan MRN: [id number] DOB: Nov 10, 1930   Cancelled Treatment:    Reason Eval/Treat Not Completed: Other (comment) (pt up in recliner. Not ready for ambulation. To stay up a little longer. will check back in AM.)   [name] 07/22/2013, 4:29 PM Tresa Endo PT [phone]

## 2013-07-23 ENCOUNTER — Inpatient Hospital Stay (HOSPITAL_COMMUNITY): Payer: Medicare Other

## 2013-07-23 LAB — CBC
HEMATOCRIT: 37.1 % (ref 36.0–46.0)
HEMOGLOBIN: 12.1 g/dL (ref 12.0–15.0)
MCH: 31 pg (ref 26.0–34.0)
MCHC: 32.6 g/dL (ref 30.0–36.0)
MCV: 95.1 fL (ref 78.0–100.0)
Platelets: 236 10*3/uL (ref 150–400)
RBC: 3.9 MIL/uL (ref 3.87–5.11)
RDW: 14 % (ref 11.5–15.5)
WBC: 13.3 10*3/uL — ABNORMAL HIGH (ref 4.0–10.5)

## 2013-07-23 LAB — GLUCOSE, CAPILLARY
GLUCOSE-CAPILLARY: 124 mg/dL — AB (ref 70–99)
Glucose-Capillary: 114 mg/dL — ABNORMAL HIGH (ref 70–99)
Glucose-Capillary: 141 mg/dL — ABNORMAL HIGH (ref 70–99)

## 2013-07-23 LAB — BASIC METABOLIC PANEL
BUN: 34 mg/dL — AB (ref 6–23)
CHLORIDE: 98 meq/L (ref 96–112)
CO2: 25 mEq/L (ref 19–32)
CREATININE: 1.43 mg/dL — AB (ref 0.50–1.10)
Calcium: 9.1 mg/dL (ref 8.4–10.5)
GFR calc Af Amer: 39 mL/min — ABNORMAL LOW (ref >90)
GFR calc non Af Amer: 33 mL/min — ABNORMAL LOW (ref >90)
Glucose, Bld: 135 mg/dL — ABNORMAL HIGH (ref 70–99)
Potassium: 4.8 mEq/L (ref 3.7–5.3)
Sodium: 136 mEq/L — ABNORMAL LOW (ref 137–147)

## 2013-07-23 LAB — BLOOD GAS, ARTERIAL
Acid-Base Excess: 2.7 mmol/L — ABNORMAL HIGH (ref 0.0–2.0)
Bicarbonate: 27.7 mEq/L — ABNORMAL HIGH (ref 20.0–24.0)
Drawn by: 103701
FIO2: 0.31 %
O2 Saturation: 89.3 %
PCO2 ART: 46.3 mmHg — AB (ref 35.0–45.0)
PH ART: 7.394 (ref 7.350–7.450)
PO2 ART: 59.2 mmHg — AB (ref 80.0–100.0)
Patient temperature: 98.6
TCO2: 24.9 mmol/L (ref 0–100)

## 2013-07-23 MED ORDER — METRONIDAZOLE 500 MG PO TABS
500.0000 mg | ORAL_TABLET | Freq: Three times a day (TID) | ORAL | Status: DC
  Administered 2013-07-23 – 2013-07-29 (×19): 500 mg via ORAL
  Filled 2013-07-23 (×21): qty 1

## 2013-07-23 MED ORDER — IPRATROPIUM-ALBUTEROL 0.5-2.5 (3) MG/3ML IN SOLN
3.0000 mL | RESPIRATORY_TRACT | Status: DC
  Administered 2013-07-23 – 2013-07-28 (×33): 3 mL via RESPIRATORY_TRACT
  Filled 2013-07-23 (×33): qty 3

## 2013-07-23 MED ORDER — LORAZEPAM 2 MG/ML IJ SOLN
1.0000 mg | Freq: Once | INTRAMUSCULAR | Status: AC
  Administered 2013-07-23: 1 mg via INTRAVENOUS
  Filled 2013-07-23: qty 1

## 2013-07-23 MED ORDER — ALBUTEROL SULFATE (2.5 MG/3ML) 0.083% IN NEBU: 2.5000 mg | INHALATION_SOLUTION | RESPIRATORY_TRACT | Status: DC | PRN

## 2013-07-23 MED ORDER — BIOTENE DRY MOUTH MT LIQD
15.0000 mL | Freq: Two times a day (BID) | OROMUCOSAL | Status: DC
  Administered 2013-07-23 – 2013-07-27 (×5): 15 mL via OROMUCOSAL

## 2013-07-23 MED ORDER — CHLORHEXIDINE GLUCONATE 0.12 % MT SOLN
15.0000 mL | Freq: Two times a day (BID) | OROMUCOSAL | Status: DC
  Administered 2013-07-23 – 2013-07-28 (×12): 15 mL via OROMUCOSAL
  Filled 2013-07-23 (×13): qty 15

## 2013-07-23 MED ORDER — HYDRALAZINE HCL 50 MG PO TABS
50.0000 mg | ORAL_TABLET | Freq: Four times a day (QID) | ORAL | Status: DC
  Administered 2013-07-23 – 2013-07-26 (×11): 50 mg via ORAL
  Filled 2013-07-23 (×15): qty 1

## 2013-07-23 MED ORDER — LEVOFLOXACIN 750 MG PO TABS
750.0000 mg | ORAL_TABLET | ORAL | Status: DC
  Administered 2013-07-23 – 2013-07-29 (×4): 750 mg via ORAL
  Filled 2013-07-23 (×4): qty 1

## 2013-07-23 MED ORDER — CLONIDINE HCL 0.1 MG/24HR TD PTWK
0.1000 mg | MEDICATED_PATCH | TRANSDERMAL | Status: DC
  Administered 2013-07-23: 0.1 mg via TRANSDERMAL
  Filled 2013-07-23 (×2): qty 1

## 2013-07-23 MED ORDER — LEVOFLOXACIN 750 MG PO TABS: 750.0000 mg | ORAL_TABLET | Freq: Every day | ORAL | Status: DC

## 2013-07-23 MED ORDER — LABETALOL HCL 5 MG/ML IV SOLN
20.0000 mg | Freq: Once | INTRAVENOUS | Status: AC
  Administered 2013-07-23: 20 mg via INTRAVENOUS
  Filled 2013-07-23: qty 4

## 2013-07-23 MED ORDER — NITROGLYCERIN IN D5W 200-5 MCG/ML-% IV SOLN
2.0000 ug/min | INTRAVENOUS | Status: DC
  Administered 2013-07-23: 5 ug/min via INTRAVENOUS
  Filled 2013-07-23: qty 250

## 2013-07-23 NOTE — Progress Notes (Signed)
[id number]/Aubry Tucholski, RN, BSN, CCM, [phone] Chart reviewed for update of needs and condition./  

## 2013-07-23 NOTE — Progress Notes (Signed)
PT Cancellation Note  _X_Treatment cancelled today due to medical issues with patient which prohibited therapy....... On Bipap and unable to participate.  ___ Treatment cancelled today due to patient receiving procedure or test   ___ Treatment cancelled today due to patient's refusal to participate   ___ Treatment cancelled today due to  Rica Koyanagi  PTA [facility]  Acute  Rehab Pager      [phone]

## 2013-07-23 NOTE — Progress Notes (Signed)
ANTIBIOTIC CONSULT NOTE - INITIAL  Pharmacy Consult for Levaquin Indication: pneumonia  No Known Allergies  Patient Measurements: Height: 5\' 6"  (167.6 cm) Weight: 176 lb 9.4 oz (80.1 kg) IBW/kg (Calculated) : 59.3  Vital Signs: Temp: 97.9 F (36.6 C) (04/28 1200) Temp src: Axillary (04/28 1200) BP: 161/64 mmHg (04/28 1315) Pulse Rate: 51 (04/28 1315) Intake/Output from previous day: 04/27 0701 - 04/28 0700 In: 120  Out: -  Intake/Output from this shift: Total I/O In: 71.2 [I.V.:1.2; Other:70] Out: -   Labs:  Recent Labs  07/21/13 0806 07/22/13 0959 07/23/13 0310  WBC  --  12.8* 13.3*  HGB  --  12.9 12.1  PLT  --  238 236  CREATININE 1.15* 1.30* 1.43*   Estimated Creatinine Clearance: 32.9 ml/min (by C-G formula based on Cr of 1.43). No results found for this basename: Letta Median, VANCORANDOM, GENTTROUGH, GENTPEAK, GENTRANDOM, TOBRATROUGH, TOBRAPEAK, TOBRARND, AMIKACINPEAK, AMIKACINTROU, AMIKACIN,  in the last 72 hours   Microbiology: Recent Results (from the past 720 hour(s))  URINE CULTURE     Status: None   Collection Time    07/18/13  6:25 PM      Result Value Ref Range Status   Specimen Description URINE, CLEAN CATCH   Final   Special Requests Normal   Final   Culture  Setup Time     Final   Value: 07/18/2013 22:05     Performed at [place]     Final   Value: 8,000 COLONIES/ML     Performed at [facility]   Culture     Final   Value: INSIGNIFICANT GROWTH     Performed at [facility]   Report Status 07/20/2013 FINAL   Final  MRSA PCR SCREENING     Status: None   Collection Time    07/21/13  8:48 AM      Result Value Ref Range Status   MRSA by PCR NEGATIVE  NEGATIVE Final   Comment:            The GeneXpert MRSA Assay (FDA     approved for NASAL specimens     only), is one component of a     comprehensive MRSA colonization     surveillance program. It is not     intended to diagnose MRSA      infection nor to guide or     monitor treatment for     MRSA infections.    Medical History: Past Medical History  Diagnosis Date  . Hyperlipidemia   . Hypertension   . DDD (degenerative disc disease)   . DJD (degenerative joint disease)     ankles  . Cluster headache     remote  . COPD (chronic obstructive pulmonary disease)     Golds Stage II feV1 63% 11/2009  . Thyroid disorder   . Breast cancer 1991    s/p R lumpectomy and radiation     Medications:  Scheduled:  . antiseptic oral rinse  15 mL Mouth Rinse q12n4p  . aspirin  81 mg Oral Daily  . atorvastatin  10 mg Oral q1800  . buPROPion  300 mg Oral Daily  . chlorhexidine  15 mL Mouth Rinse BID  . cloNIDine  0.1 mg Transdermal Weekly  . enoxaparin (LOVENOX) injection  40 mg Subcutaneous Q24H  . folic acid  1 mg Oral Daily  . ipratropium-albuterol  3 mL Nebulization Q4H  . levofloxacin  750 mg Oral  Q48H  . levothyroxine  50 mcg Oral QAC breakfast  . lovastatin  40 mg Oral QHS  . methylPREDNISolone (SOLU-MEDROL) injection  40 mg Intravenous 3 times per day  . metroNIDAZOLE  500 mg Oral 3 times per day  . multivitamin with minerals  1 tablet Oral Daily  . thiamine  100 mg Oral Daily   Or  . thiamine  100 mg Intravenous Daily  . verapamil  240 mg Oral BID   Infusions:  . nitroGLYCERIN 2 mcg/min (07/23/13 1106)   PRN: acetaminophen, acetaminophen, albuterol, hydrALAZINE, morphine injection, ondansetron (ZOFRAN) IV, ondansetron, sodium chloride  Assessment: 80 y.o. female presenting on 4/23 with history of COPD, hypothyroidism, chronic alcoholism, tobacco abuse, h/o breat CA, HTN, started experiencing N/V and left flank pain on 4/23. As her GI symptoms resolved, she developed acute dyspnea on the morning of 4/26. CXR notes possible infiltrate in LLL. abx started for possible aspiration pneumonia.  4/28 >>Levaquin PO>> 4/28>> Flagyl PO (MD)>>  Tmax: AF WBCs: 13.3 (on steroids) Renal: 1.43 (crcl  3ml/min)  4/26 MRSA PCR: neg 4/23 urine: insignificant growth F   Goal of Therapy:  Adjust medications based on renal function  Plan:  1. Start Levaquin 750mg  PO Q48H 2. Follow up culture results  Julio Sicks Pager: [phone] 07/23/2013,2:23 PM

## 2013-07-23 NOTE — Progress Notes (Addendum)
[facility] Progress Note   Renee Donovan KDX:[id number] DOB: 05-22-1930 DOA: 07/18/2013 PCP: [name], MD  Brief narrative: Renee Donovan is a 80 y.o. female presenting on 07/18/2013 with  with history of COPD, hypothyroidism, chronic alcoholism, tobacco abuse, history of breast cancer, hypertension started experiencing nausea vomiting and left flank pain on 4/23. As her GI symptoms resolved, she developed acute dyspnea on the morning of 4/26. This was mostly due to COPD exacerbation and partly fluid overload. She continues to require BiPAP PRN and now has significant HTN. ECHO was obtained and reveals Gr 2 diastolic dysfunction. Lasix was given daily but will stop today as Cr rising. Unable to measure I and O as she is incontinent and I have chosen not to place a foley for now.     Subjective: C/o shortness of breath  Assessment/Plan: Principal Problem: Acute respiratory failure- COPD exacerbation and mild fluid overload - CXR continues to be read as "mild edema" - possibly chronic fibrosis - I have given her 3 doses of Lasix and BUN/ Cr ratio is now rising (becoming dehydrated) - will hold lasix for today - ECHO showing Gr 2 diastolic dysfunction- BP quite elevated and may be adding to resp distress, therefore, will control BP with Nitro infusion - ABG continues to show a mild hypoxia on 31% FiO2 - - cont  BiPAP PRN today  - CXR notes possible infiltrate in LLL, will start Levaquin and Flagyl (possible aspiration) - cont Solumedrol - cont to follow in SDU and monitor closely   ADDENDUM- BP continues to be elevated- add oral hydralazine    Nausea and vomiting- possibly gastroenteritis? - resolved - advanced to full liquids 2 days ago and has been doing well with them -will not advance further as she may cont to need BiPAP throughout the day  Active Problems: UTI?? - mild on UA -  culture negative - d/c Rocephin  ETOH abuse - CIWA scale ordered- has had mild  withdrawal symptoms     Hypothyroidism - cont levothyroxine    Toe ulcer/PVD - added ASA and statin - ABIs quite low - outpt vascular surgery eval - advised to stop smoking     Accelerated hypertension - cont Clonidine patch and Verapamil  ARF - resolved with hydration - Cr slightly worse due to Lasix   Code Status: full code Family Communication: with daughter in law  Disposition Plan:  SNF in 1-2 days  Consultants: none  Procedures: none  Antibiotics: Antibiotics Given (last 72 hours)   None       DVT prophylaxis: Lovenox  Objective: Filed Weights   07/18/13 2220 07/21/13 0840 07/22/13 0400  Weight: 77.066 kg (169 lb 14.4 oz) 78.9 kg (173 lb 15.1 oz) 80.1 kg (176 lb 9.4 oz)   Blood pressure 158/54, pulse 50, temperature 97.9 F (36.6 C), temperature source Axillary, resp. rate 23, height 5\' 6"  (1.676 m), weight 80.1 kg (176 lb 9.4 oz), SpO2 93.00%.  Intake/Output Summary (Last 24 hours) at 07/23/13 1312 Last data filed at 07/23/13 1200  Gross per 24 hour  Intake 141.19 ml  Output      0 ml  Net 141.19 ml     Exam: General: AAO x 3 Lungs: mild wheezing- - tachypneic but no use of accessory muscles Cardiovascular: Regular rate and rhythm without murmur gallop or rub normal S1 and S2 Abdomen: Nontender, nondistended, soft, bowel sounds positive, no rebound, no ascites, no appreciable mass Extremities: No significant cyanosis, clubbing, or edema bilateral  lower extremities  Data Reviewed: Basic Metabolic Panel:  Recent Labs Lab 07/19/13 0434 07/20/13 0520 07/21/13 0806 07/22/13 0959 07/23/13 0310  NA 140 137 137 136* 136*  K 3.9 3.7 4.4 4.0 4.8  CL 97 97 100 97 98  CO2 29 27 26 25 25   GLUCOSE 95 75 99 147* 135*  BUN 26* 30* 21 25* 34*  CREATININE 1.72* 1.70* 1.15* 1.30* 1.43*  CALCIUM 8.7 9.0 8.9 9.3 9.1   Liver Function Tests:  Recent Labs Lab 07/18/13 1322 07/19/13 0434  AST 20 14  ALT 14 10  ALKPHOS 86 65  BILITOT 1.5* 0.9   PROT 7.7 6.1  ALBUMIN 4.1 3.4*    Recent Labs Lab 07/18/13 1322  LIPASE 30   No results found for this basename: AMMONIA,  in the last 168 hours CBC:  Recent Labs Lab 07/18/13 1322 07/19/13 0434 07/20/13 1315 07/22/13 0959 07/23/13 0310  WBC 12.1* 15.4* 11.1* 12.8* 13.3*  NEUTROABS 10.2* 11.9*  --   --   --   HGB 14.8 12.8 12.4 12.9 12.1  HCT 44.4 38.5 37.0 38.6 37.1  MCV 93.1 93.9 94.9 93.0 95.1  PLT 253 248 212 238 236   Cardiac Enzymes:  Recent Labs Lab 07/18/13 1322  TROPONINI <0.30   BNP (last 3 results) No results found for this basename: PROBNP,  in the last 8760 hours CBG:  Recent Labs Lab 07/22/13 0533 07/22/13 1159 07/22/13 1834 07/22/13 2352 07/23/13 1147  GLUCAP 139* 157* 173* 141* 124*    Recent Results (from the past 240 hour(s))  URINE CULTURE     Status: None   Collection Time    07/18/13  6:25 PM      Result Value Ref Range Status   Specimen Description URINE, CLEAN CATCH   Final   Special Requests Normal   Final   Culture  Setup Time     Final   Value: 07/18/2013 22:05     Performed at [place]     Final   Value: 8,000 COLONIES/ML     Performed at [facility]   Culture     Final   Value: INSIGNIFICANT GROWTH     Performed at [facility]   Report Status 07/20/2013 FINAL   Final  MRSA PCR SCREENING     Status: None   Collection Time    07/21/13  8:48 AM      Result Value Ref Range Status   MRSA by PCR NEGATIVE  NEGATIVE Final   Comment:            The GeneXpert MRSA Assay (FDA     approved for NASAL specimens     only), is one component of a     comprehensive MRSA colonization     surveillance program. It is not     intended to diagnose MRSA     infection nor to guide or     monitor treatment for     MRSA infections.     Studies:  Recent x-ray studies have been reviewed in detail by the Attending Physician  Scheduled Meds:  Scheduled Meds: . antiseptic oral rinse  15  mL Mouth Rinse q12n4p  . aspirin  81 mg Oral Daily  . atorvastatin  10 mg Oral q1800  . buPROPion  300 mg Oral Daily  . chlorhexidine  15 mL Mouth Rinse BID  . cloNIDine  0.1 mg Transdermal Weekly  . enoxaparin (LOVENOX) injection  40 mg Subcutaneous Q24H  . folic acid  1 mg Oral Daily  . ipratropium-albuterol  3 mL Nebulization Q4H  . levothyroxine  50 mcg Oral QAC breakfast  . lovastatin  40 mg Oral QHS  . methylPREDNISolone (SOLU-MEDROL) injection  40 mg Intravenous 3 times per day  . multivitamin with minerals  1 tablet Oral Daily  . thiamine  100 mg Oral Daily   Or  . thiamine  100 mg Intravenous Daily  . verapamil  240 mg Oral BID   Continuous Infusions: . nitroGLYCERIN 2 mcg/min (07/23/13 1106)    Time spent on care of this patient: 30 min   Debbe Odea, MD 07/23/2013, 1:12 PM  LOS: 5 days   [facility] Office  [phone] Pager - Text Page per Shea Evans   If 7PM-7AM, please contact night-coverage [email or web address]

## 2013-07-23 NOTE — Progress Notes (Signed)
OT Cancellation Note  Patient Details Name: Renee Donovan MRN: [id number] DOB: [birth year]   Cancelled Treatment:    Reason Eval/Treat Not Completed: Medical issues which prohibited therapy. Spoke with nursing and pt still not able to come off Bipap. Will check back tomorrow.  Almon Register [phone] 07/23/2013, 8:38 AM

## 2013-07-24 LAB — CBC
HEMATOCRIT: 37.6 % (ref 36.0–46.0)
HEMOGLOBIN: 12.3 g/dL (ref 12.0–15.0)
MCH: 31.1 pg (ref 26.0–34.0)
MCHC: 32.7 g/dL (ref 30.0–36.0)
MCV: 95.2 fL (ref 78.0–100.0)
Platelets: 267 10*3/uL (ref 150–400)
RBC: 3.95 MIL/uL (ref 3.87–5.11)
RDW: 13.9 % (ref 11.5–15.5)
WBC: 13.1 10*3/uL — AB (ref 4.0–10.5)

## 2013-07-24 LAB — BASIC METABOLIC PANEL
BUN: 46 mg/dL — ABNORMAL HIGH (ref 6–23)
CHLORIDE: 100 meq/L (ref 96–112)
CO2: 26 mEq/L (ref 19–32)
CREATININE: 1.67 mg/dL — AB (ref 0.50–1.10)
Calcium: 8.9 mg/dL (ref 8.4–10.5)
GFR calc non Af Amer: 28 mL/min — ABNORMAL LOW (ref >90)
GFR, EST AFRICAN AMERICAN: 32 mL/min — AB (ref >90)
Glucose, Bld: 139 mg/dL — ABNORMAL HIGH (ref 70–99)
POTASSIUM: 4.1 meq/L (ref 3.7–5.3)
SODIUM: 139 meq/L (ref 137–147)

## 2013-07-24 LAB — GLUCOSE, CAPILLARY
GLUCOSE-CAPILLARY: 116 mg/dL — AB (ref 70–99)
GLUCOSE-CAPILLARY: 147 mg/dL — AB (ref 70–99)
Glucose-Capillary: 128 mg/dL — ABNORMAL HIGH (ref 70–99)

## 2013-07-24 MED ORDER — ISOSORBIDE MONONITRATE ER 60 MG PO TB24
60.0000 mg | ORAL_TABLET | Freq: Every day | ORAL | Status: DC
  Administered 2013-07-24 – 2013-07-28 (×5): 60 mg via ORAL
  Filled 2013-07-24 (×5): qty 1

## 2013-07-24 MED ORDER — METHYLPREDNISOLONE SODIUM SUCC 40 MG IJ SOLR
40.0000 mg | Freq: Two times a day (BID) | INTRAMUSCULAR | Status: DC
  Administered 2013-07-24 – 2013-07-25 (×2): 40 mg via INTRAVENOUS
  Filled 2013-07-24 (×2): qty 1

## 2013-07-24 MED ORDER — NITROGLYCERIN IN D5W 200-5 MCG/ML-% IV SOLN
2.0000 ug/min | INTRAVENOUS | Status: DC
  Administered 2013-07-24: 35 ug/min via INTRAVENOUS
  Filled 2013-07-24: qty 250

## 2013-07-24 MED ORDER — MORPHINE SULFATE 2 MG/ML IJ SOLN
2.0000 mg | INTRAMUSCULAR | Status: DC | PRN
  Administered 2013-07-24 – 2013-07-28 (×3): 2 mg via INTRAVENOUS
  Filled 2013-07-24 (×3): qty 1

## 2013-07-24 MED ORDER — CALCIUM CARBONATE ANTACID 500 MG PO CHEW
2.0000 | CHEWABLE_TABLET | Freq: Three times a day (TID) | ORAL | Status: DC | PRN
  Administered 2013-07-24 – 2013-07-28 (×3): 400 mg via ORAL
  Filled 2013-07-24 (×3): qty 2

## 2013-07-24 MED ORDER — METOPROLOL TARTRATE 12.5 MG HALF TABLET
12.5000 mg | ORAL_TABLET | Freq: Two times a day (BID) | ORAL | Status: DC
  Administered 2013-07-24 – 2013-07-29 (×10): 12.5 mg via ORAL
  Filled 2013-07-24 (×13): qty 1

## 2013-07-24 MED ORDER — ENOXAPARIN SODIUM 30 MG/0.3ML ~~LOC~~ SOLN
30.0000 mg | SUBCUTANEOUS | Status: DC
  Administered 2013-07-24 – 2013-07-28 (×5): 30 mg via SUBCUTANEOUS
  Filled 2013-07-24 (×6): qty 0.3

## 2013-07-24 NOTE — Progress Notes (Signed)
PROGRESS NOTE  Renee Donovan [id number] DOB: [birth year] DOA: 07/18/2013 PCP: [name], MD  Assessment/Plan: Acute on Chronic respiratory failure-  -COPD exacerbation and mild fluid overload  - 07/23/2013 CXR continues to be read as "mild edema" - possibly chronic fibrosis - - received 3 doses of Lasix and BUN/ Cr ratio is now rising (becoming dehydrated) - will hold lasix for today  - ECHO showing Gr 2 diastolic dysfunction, EF 123456 - 07/23/2013 ABG 7.39/46/59/27 on 30% FiO2  -  cont BiPAP PRN today--presently stable on 3 L nasal cannula  - CXR notes possible infiltrate in LLL, will start Levaquin and Flagyl (possible aspiration)  - cont Solumedrol  - cont to follow in SDU and monitor closely  -goal SaO2 >92%--presently stable on 3 L nasal cannula--95% saturation Acute diastolic CHF -EF 123456, grade 2 diastolic dysfunction -proBNP in am Nausea and vomiting- possibly gastroenteritis?  - resolved  - advanced to full liquids 2 days ago and has been doing well with them -will not advance further as she may cont to need BiPAP throughout the day  Active Problems:  Pyuria - culture negative  - d/c Rocephin  ETOH abuse  - CIWA scale ordered -Drinks at least 16 ounces wine daily  Hypothyroidism  - cont levothyroxine  Toe ulcer/PVD  - added ASA and statin  - ABIs quite low - outpt vascular surgery eval  - advised to stop smoking  Accelerated hypertension  - cont Clonidine patch and Verapamil  -Added metoprolol tartrate and Imdur lower in hopes to wean nitroglycerin drip  -wean NTG drip for SBP <160 Acute on Chronic Renal Failure (CKD stage 3)  - resolved with hydration  - Cr worse due to Lasix   Family Communication:   Pt at beside Disposition Plan:   Home when medically stable    Antibiotics:  Levofloxacin 07/23/2013>>>  Flagyl 07/23/2013>>>    Procedures/Studies: Ct Abdomen Pelvis Wo Contrast  07/18/2013   CLINICAL DATA:  Mid abdominal  pain, flank pain  EXAM: CT ABDOMEN AND PELVIS WITHOUT CONTRAST  TECHNIQUE: Multidetector CT imaging of the abdomen and pelvis was performed following the standard protocol without IV contrast.  COMPARISON:  None.  FINDINGS: Lower chest: Minor basilar atelectasis. No lower lobe pneumonia. Normal heart size. No pericardial or pleural effusion. Extensive atherosclerosis of the lower thoracic and abdominal aorta. Mild ectasia of the lower thoracic aorta at the diaphragmatic hiatus measuring 3.8 x 3.7 cm, image 13.  Abdomen: Several small layering calcified gallstones noted. No biliary dilatation or gallbladder distention. No inflammatory process in the right upper quadrant.  Liver, biliary system, pancreas, spleen, and adrenal glands are within normal limits for age and noncontrast imaging.  Kidneys demonstrate no acute obstruction, hydronephrosis, or obstructing ureteral calculus on either side. Ureters are symmetric and decompressed. No dilated ureter or hydroureter. Left kidney demonstrates a small hypodense parapelvic cyst measuring 16 mm with negative Hounsfield units, image 28. No other renal abnormality. Extensive calcific atherosclerosis of the aorta and iliac vessels and tortuosity. No significant abdominal aneurysm.  Negative for bowel obstruction, dilatation, ileus, or free air. Scattered colonic diverticulosis, most pronounced on the right colon.  No abdominal free fluid, fluid collection, hemorrhage, abscess, or adenopathy.  Pelvis: Bilateral adnexal ovarian cysts noted. Right ovarian cyst measures 5.4 x 3.4 cm, image 60. There is a larger left ovarian cyst measuring 8.1 x 8.4 cm, image 61. These are concerning for abnormal ovarian cysts in a postmenopausal  female. Ovarian cystic malignancy is not excluded. No associated ascites or peritoneal disease. Uterus is small and atrophic. Urinary bladder unremarkable. No acute distal bowel process.  Diffuse degenerative changes noted of the spine. Multilevel vacuum  disc phenomena noted. Normal alignment.  IMPRESSION: Negative for acute obstructing ureteral calculus, obstructive uropathy, or hydronephrosis.  Probable incidental left renal parapelvic cyst measuring 16 mm.  Aortic calcific atherosclerosis and mild ectasia, maximal diameter 3.8 cm.  Cholelithiasis  Diffuse right colon diverticulosis  Bilateral ovarian cystic masses, largest on the left ovary measures 8.4 cm. These are concerning for a cystic ovarian malignancies in a postmenopausal female. No associated ascites or peritoneal disease. This warrants further gynecologic workup.   Electronically Signed   By: Daryll Brod M.D.   On: 07/18/2013 21:54   Dg Chest Port 1 View  07/23/2013   CLINICAL DATA:  hypoxia  EXAM: PORTABLE CHEST - 1 VIEW  COMPARISON:  DG CHEST 1V PORT dated 07/22/2013  FINDINGS: Low lung volumes. Cardiac silhouette is enlarged. Atherosclerotic calcifications within the aorta. Increased density left lung base and blunting of the left costophrenic angle. There is diffuse prominence of interstitial markings and areas of mild peribronchial cuffing.  IMPRESSION: Pulmonary edema  Atelectasis versus infiltrate left lung base and trace left effusion.   Electronically Signed   By: Margaree Mackintosh M.D.   On: 07/23/2013 10:06   Dg Chest Port 1 View  07/22/2013   CLINICAL DATA:  Hypoxia  EXAM: PORTABLE CHEST - 1 VIEW  COMPARISON:  July 21, 2013  FINDINGS: There is underlying emphysema. There is slightly less interstitial edema. There is patchy consolidation in the left base with small left effusion.  Heart is mildly enlarged. The pulmonary vascularity reflects pulmonary venous hypertension. There are surgical clips in the right axilla.  IMPRESSION: Congestive heart failure superimposed on underlying emphysematous change with slightly less edema compared to 1 day prior. Mild consolidation left base. No new opacity.   Electronically Signed   By: Lowella Grip M.D.   On: 07/22/2013 08:19   Dg Chest Port  1 View  07/21/2013   CLINICAL DATA:  Dyspnea  EXAM: PORTABLE CHEST - 1 VIEW  COMPARISON:  Prior chest x-ray 07/18/2013  FINDINGS: Stable mild cardiomegaly. Atherosclerotic calcifications noted in the transverse aorta. Interval development of diffuse bilateral interstitial opacities consistent with interstitial pulmonary edema. Dense left basilar opacity favored to reflect a combination of atelectasis developing alveolar edema. No pneumothorax. Small left effusion not excluded. Surgical clips again noted in the right axilla. No acute osseous abnormality.  IMPRESSION: 1. Interval development of mild CHF. 2. Left basilar opacity favored to reflect a combination of atelectasis and developing alveolar edema. Small pleural effusion not excluded.   Electronically Signed   By: Jacqulynn Cadet M.D.   On: 07/21/2013 08:19   Dg Abd Acute W/chest  07/18/2013   CLINICAL DATA:  pain  EXAM: ACUTE ABDOMEN SERIES (ABDOMEN 2 VIEW & CHEST 1 VIEW)  COMPARISON:  DG CHEST 1V PORT dated 07/07/2012  FINDINGS: There is no evidence of dilated bowel loops or free intraperitoneal air. No radiopaque calculi or other significant radiographic abnormality is seen. Heart size and mediastinal contours are within normal limits. The lungs are hyperinflated and there is flattening of the hemidiaphragms. Lungs otherwise clear. Postsurgical changes in the right axilla.  IMPRESSION: Negative abdominal radiographs. No acute cardiopulmonary disease COPD.   Electronically Signed   By: Margaree Mackintosh M.D.   On: 07/18/2013 16:45   Dg Foot Complete Left  07/18/2013   CLINICAL DATA:  Ulcerations.  EXAM: LEFT FOOT - COMPLETE 3+ VIEW  COMPARISON:  None.  FINDINGS: Diffuse severe osteopenia and degenerative change. No acute bony or joint abnormality. If osteomyelitis is of clinical concern MRI can be obtained.  IMPRESSION: Diffuse osteopenia and degenerative change, no acute bony abnormality identified.   Electronically Signed   By: Marcello Moores  Register   On:  07/18/2013 21:59   Dg Foot Complete Right  07/18/2013   CLINICAL DATA:  Toe ulceration  EXAM: RIGHT FOOT COMPLETE - 3+ VIEW  COMPARISON:  None.  FINDINGS: No acute fracture. No dislocation. Deformity of the phalanges of the small toe has a chronic appearance. Severe osteopenia. Nonspecific subtle erosion at the lateral head of the proximal phalanx of the great toe.  IMPRESSION: No acute bony pathology.  Chronic changes.   Electronically Signed   By: Maryclare Bean M.D.   On: 07/18/2013 21:59         Subjective: Patient is breathing better. Denies any fevers, chills, nausea, vomiting, chest pain, diarrhea, abdominal pain.  Objective: Filed Vitals:   07/24/13 0730 07/24/13 0745 07/24/13 0747 07/24/13 0800  BP: 171/69 143/60    Pulse: 62 62    Temp:    97.2 F (36.2 C)  TempSrc:    Axillary  Resp: 16 16    Height:      Weight:      SpO2: 96% 95% 95%     Intake/Output Summary (Last 24 hours) at 07/24/13 0858 Last data filed at 07/24/13 0200  Gross per 24 hour  Intake 173.77 ml  Output      0 ml  Net 173.77 ml   Weight change:  Exam:   General:  Pt is alert, follows commands appropriately, not in acute distress  HEENT: No icterus, No thrush,  [place]/AT  Cardiovascular: RRR, S1/S2, no rubs, no gallops  Respiratory: Bilateral crackles, left greater than right. No wheezing.  Abdomen: Soft/+BS, non tender, non distended, no guarding  Extremities: No edema, No lymphangitis, No petechiae, No rashes, no synovitis  Data Reviewed: Basic Metabolic Panel:  Recent Labs Lab 07/20/13 0520 07/21/13 0806 07/22/13 0959 07/23/13 0310 07/24/13 0318  NA 137 137 136* 136* 139  K 3.7 4.4 4.0 4.8 4.1  CL 97 100 97 98 100  CO2 27 26 25 25 26   GLUCOSE 75 99 147* 135* 139*  BUN 30* 21 25* 34* 46*  CREATININE 1.70* 1.15* 1.30* 1.43* 1.67*  CALCIUM 9.0 8.9 9.3 9.1 8.9   Liver Function Tests:  Recent Labs Lab 07/18/13 1322 07/19/13 0434  AST 20 14  ALT 14 10  ALKPHOS 86 65    BILITOT 1.5* 0.9  PROT 7.7 6.1  ALBUMIN 4.1 3.4*    Recent Labs Lab 07/18/13 1322  LIPASE 30   No results found for this basename: AMMONIA,  in the last 168 hours CBC:  Recent Labs Lab 07/18/13 1322 07/19/13 0434 07/20/13 1315 07/22/13 0959 07/23/13 0310 07/24/13 0318  WBC 12.1* 15.4* 11.1* 12.8* 13.3* 13.1*  NEUTROABS 10.2* 11.9*  --   --   --   --   HGB 14.8 12.8 12.4 12.9 12.1 12.3  HCT 44.4 38.5 37.0 38.6 37.1 37.6  MCV 93.1 93.9 94.9 93.0 95.1 95.2  PLT 253 248 212 238 236 267   Cardiac Enzymes:  Recent Labs Lab 07/18/13 1322  TROPONINI <0.30   BNP: No components found with this basename: POCBNP,  CBG:  Recent Labs Lab 07/22/13 1834 07/22/13  2352 07/23/13 1147 07/23/13 1815 07/23/13 2347  GLUCAP 173* 141* 124* 114* 147*    Recent Results (from the past 240 hour(s))  URINE CULTURE     Status: None   Collection Time    07/18/13  6:25 PM      Result Value Ref Range Status   Specimen Description URINE, CLEAN CATCH   Final   Special Requests Normal   Final   Culture  Setup Time     Final   Value: 07/18/2013 22:05     Performed at [place]     Final   Value: 8,000 COLONIES/ML     Performed at [facility]   Culture     Final   Value: INSIGNIFICANT GROWTH     Performed at [facility]   Report Status 07/20/2013 FINAL   Final  MRSA PCR SCREENING     Status: None   Collection Time    07/21/13  8:48 AM      Result Value Ref Range Status   MRSA by PCR NEGATIVE  NEGATIVE Final   Comment:            The GeneXpert MRSA Assay (FDA     approved for NASAL specimens     only), is one component of a     comprehensive MRSA colonization     surveillance program. It is not     intended to diagnose MRSA     infection nor to guide or     monitor treatment for     MRSA infections.     Scheduled Meds: . antiseptic oral rinse  15 mL Mouth Rinse q12n4p  . aspirin  81 mg Oral Daily  . atorvastatin  10 mg Oral  q1800  . buPROPion  300 mg Oral Daily  . chlorhexidine  15 mL Mouth Rinse BID  . cloNIDine  0.1 mg Transdermal Weekly  . enoxaparin (LOVENOX) injection  30 mg Subcutaneous Q24H  . folic acid  1 mg Oral Daily  . hydrALAZINE  50 mg Oral 4 times per day  . ipratropium-albuterol  3 mL Nebulization Q4H  . isosorbide mononitrate  60 mg Oral Daily  . levofloxacin  750 mg Oral Q48H  . levothyroxine  50 mcg Oral QAC breakfast  . lovastatin  40 mg Oral QHS  . methylPREDNISolone (SOLU-MEDROL) injection  40 mg Intravenous 3 times per day  . metoprolol tartrate  12.5 mg Oral BID  . metroNIDAZOLE  500 mg Oral 3 times per day  . multivitamin with minerals  1 tablet Oral Daily  . thiamine  100 mg Oral Daily   Or  . thiamine  100 mg Intravenous Daily  . verapamil  240 mg Oral BID   Continuous Infusions: . nitroGLYCERIN 35 mcg/min (07/24/13 0800)     Orson Eva, DO  [facility] Pager [phone]  If 7PM-7AM, please contact night-coverage [email or web address] Password [email or web address] 07/24/2013, 8:58 AM   LOS: 6 days

## 2013-07-24 NOTE — Progress Notes (Signed)
CSW continuing to follow.  Pt plans for short term rehab at [facility] upon discharge.  CSW received update from RN and current barrier is pt respiratory issues as pt has still been requiring BiPap as needed. Per RN, pt presently stable on 3 L nasal cannula.   CSW attempted to visit with pt and provided support, but pt sleeping at this time. No family present.  CSW contacted [facility] and updated facility.  CSW to continue to follow to provide support and assist with pt discharge to [facility] when pt medically stable.  Alison Murray, MSW, [place] [employer] [phone]

## 2013-07-25 ENCOUNTER — Inpatient Hospital Stay (HOSPITAL_COMMUNITY): Payer: Medicare Other

## 2013-07-25 DIAGNOSIS — I5031 Acute diastolic (congestive) heart failure: Secondary | ICD-10-CM

## 2013-07-25 DIAGNOSIS — J441 Chronic obstructive pulmonary disease with (acute) exacerbation: Secondary | ICD-10-CM

## 2013-07-25 DIAGNOSIS — I509 Heart failure, unspecified: Secondary | ICD-10-CM

## 2013-07-25 LAB — BASIC METABOLIC PANEL
BUN: 51 mg/dL — ABNORMAL HIGH (ref 6–23)
CALCIUM: 9.1 mg/dL (ref 8.4–10.5)
CO2: 27 mEq/L (ref 19–32)
Chloride: 100 mEq/L (ref 96–112)
Creatinine, Ser: 1.68 mg/dL — ABNORMAL HIGH (ref 0.50–1.10)
GFR calc Af Amer: 32 mL/min — ABNORMAL LOW (ref >90)
GFR, EST NON AFRICAN AMERICAN: 27 mL/min — AB (ref >90)
Glucose, Bld: 117 mg/dL — ABNORMAL HIGH (ref 70–99)
POTASSIUM: 4 meq/L (ref 3.7–5.3)
SODIUM: 137 meq/L (ref 137–147)

## 2013-07-25 LAB — GLUCOSE, CAPILLARY
GLUCOSE-CAPILLARY: 112 mg/dL — AB (ref 70–99)
Glucose-Capillary: 111 mg/dL — ABNORMAL HIGH (ref 70–99)
Glucose-Capillary: 113 mg/dL — ABNORMAL HIGH (ref 70–99)
Glucose-Capillary: 139 mg/dL — ABNORMAL HIGH (ref 70–99)
Glucose-Capillary: 103 mg/dL — ABNORMAL HIGH (ref 70–99)

## 2013-07-25 LAB — PRO B NATRIURETIC PEPTIDE: PRO B NATRI PEPTIDE: 4353 pg/mL — AB (ref 0–450)

## 2013-07-25 MED ORDER — PREDNISONE 20 MG PO TABS
40.0000 mg | ORAL_TABLET | Freq: Every day | ORAL | Status: DC
  Administered 2013-07-26 – 2013-07-27 (×2): 40 mg via ORAL
  Filled 2013-07-25 (×3): qty 2

## 2013-07-25 MED ORDER — LORAZEPAM 2 MG/ML IJ SOLN
2.0000 mg | INTRAMUSCULAR | Status: DC | PRN
Start: 2013-07-25 — End: 2013-07-29
  Administered 2013-07-25: 2 mg via INTRAVENOUS
  Filled 2013-07-25: qty 1

## 2013-07-25 NOTE — Progress Notes (Signed)
PT Cancellation Note  Patient Details Name: JMYA ULIANO MRN: [id number] DOB: [birth year]   Cancelled Treatment:    Reason Eval/Treat Not Completed: Medical issues which prohibited therapy (pt on Venti mask, states she just wants to stay in bed, hasn"t had sleep. will try to check back this PM as schedule allows.)   [name] 07/25/2013, 9:17 AM Tresa Endo PT [phone]

## 2013-07-25 NOTE — Progress Notes (Signed)
OT Cancellation Note  Patient Details Name: Renee Donovan MRN: [id number] DOB: 1930/04/21   Cancelled Treatment:    Reason Eval/Treat Not Completed: Fatigue/lethargy limiting ability to participate (Pt on venti-mask, declining OOB at this time.) Will continue to follow.  [name] 07/25/2013, 11:30 AM

## 2013-07-25 NOTE — Consult Note (Signed)
CARDIOLOGY CONSULT NOTE       Patient ID: Renee Donovan MRN: [id number] DOB/AGE: 1930-08-04 80 y.o.  Admit date: 07/18/2013 Referring Physician:  Alva Garnet Primary Physician: [name], MD Primary Cardiologist:  Hochrein Reason for Consultation:  Chest Pain   Principal Problem:   Nausea and vomiting Active Problems:   Hypothyroidism   COPD (chronic obstructive pulmonary disease) with emphysema   Toe ulcer   Accelerated hypertension   Nausea & vomiting   Acute and chronic respiratory failure   ARF (acute renal failure)   Acute diastolic CHF (congestive heart failure)   COPD exacerbation   HPI:   80 yo chronically ill white female admitted 4/23.  History of COPD, hypothyroidism, chronic alcoholism, tobacco abuse, history of breast cancer, hypertension started experiencing nausea vomiting 4/23  Patient's nausea vomiting or persistent and unable to keep in anything. Eventually patient started developing right upper quadrant flank pain. Denies any diarrhea. Denies any chest pain or shortness of breath. Patient has been noticing some ulcers in her toes in both feet. In the ER labs reveal possible UTI. Since patient had some right flank pain and right upper quadrant pain CT abdomen and pelvis which showed bilateral ovarian masses and chololithiasis . She has severe COPD on oxygen over a year Sees Dr Joya Gaskins.  CXR with intertitial markings and BNP elevated Echo with normal EF Diagnosed with diastolic dysfunction and diuresed but now BUN and CR elevated.  Still with dyspnea and mild LE edema  Still smoking  Was not on diuretic prior to admission Also drinks wine daily.  BP has since come done and iv nitro being weaned by nurse.  No chest pain  On pulsed dose steroids currently 40 mg    ROS All other systems reviewed and negative except as noted above  Past Medical History  Diagnosis Date  . Hyperlipidemia   . Hypertension   . DDD (degenerative disc disease)   . DJD (degenerative  joint disease)     ankles  . Cluster headache     remote  . COPD (chronic obstructive pulmonary disease)     Golds Stage II feV1 63% 11/2009  . Thyroid disorder   . Breast cancer 1991    s/p R lumpectomy and radiation     Family History  Problem Relation Age of Onset  . Coronary artery disease Father   . Heart attack Father   . Diabetes Father   . Breast cancer Mother     History   Social History  . Marital Status: Divorced    Spouse Name: N/A    Number of Children: 45  . Years of Education: 12   Occupational History  . retired     Therapist, occupational, Scientist, research (life sciences) estate   Social History Main Topics  . Smoking status: Current Every Day Smoker -- 0.50 packs/day for 60 years    Types: Cigarettes  . Smokeless tobacco: Never Used     Comment: e cigarrette///02/08/12  . Alcohol Use: Yes     Comment: 4 glasses of wine daily  . Drug Use: No  . Sexual Activity: Not on file   Other Topics Concern  . 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 Married 1956- 3 years, divorced 54- 33 years- divorced   1 son- 4, 3 adopted daughter [place]: 2 grandchildren. Work- Radio broadcast assistant, Risk manager, Personal assistant, retired. $$- tight. Lives alone- house owned/paid for. Son lives nearby   [place]  will- wants to full code. Did discuss probabilities of success and chance of permanent mechanical ventilation. Will discuss further at next visit. (April '13).  Current smoker 1 ppd x 60 years. 4 glasses of wine daily.    Past Surgical History  Procedure Laterality Date  . Nose surgery  1993  . Chin lift  [id number]  . Toe surgery  1965    5th toe- right foot  . Breast lumpectomy  1991    Right breast     . antiseptic oral rinse  15 mL Mouth Rinse q12n4p  . aspirin  81 mg Oral Daily  . atorvastatin  10 mg Oral q1800  . buPROPion  300 mg Oral Daily  . chlorhexidine  15 mL Mouth Rinse BID  . cloNIDine  0.1 mg Transdermal Weekly  . enoxaparin (LOVENOX)  injection  30 mg Subcutaneous Q24H  . folic acid  1 mg Oral Daily  . hydrALAZINE  50 mg Oral 4 times per day  . ipratropium-albuterol  3 mL Nebulization Q4H  . isosorbide mononitrate  60 mg Oral Daily  . levofloxacin  750 mg Oral Q48H  . levothyroxine  50 mcg Oral QAC breakfast  . lovastatin  40 mg Oral QHS  . metoprolol tartrate  12.5 mg Oral BID  . metroNIDAZOLE  500 mg Oral 3 times per day  . multivitamin with minerals  1 tablet Oral Daily  . [START ON 07/26/2013] predniSONE  40 mg Oral Q breakfast  . thiamine  100 mg Oral Daily   Or  . thiamine  100 mg Intravenous Daily  . verapamil  240 mg Oral BID   . nitroGLYCERIN 15 mcg/min (07/25/13 1403)    Physical Exam: Blood pressure 124/55, pulse 50, temperature 98.3 F (36.8 C), temperature source Oral, resp. rate 14, height 5\' 6"  (1.676 m), weight 178 lb 12.7 oz (81.1 kg), SpO2 96.00%.   Affect appropriate Chronically ill white female  HEENT: normal Neck supple with no adenopathy JVP normal no bruits no thyromegaly Lungs rhonchi and  Wheezing with prolonged expitory phase  and good diaphragmatic motion Heart:  S1/S2 no murmur, no rub, gallop or click PMI normal Abdomen: benighn, BS positve, no tenderness, no AAA no bruit.  No HSM or HJR Distal pulses diminished with ulcers on toes  Plus one bilateral edema Neuro non-focal Skin warm and dry No muscular weakness   Labs:   Lab Results  Component Value Date   WBC 13.1* 07/24/2013   HGB 12.3 07/24/2013   HCT 37.6 07/24/2013   MCV 95.2 07/24/2013   PLT 267 07/24/2013    Recent Labs Lab 07/19/13 0434  07/25/13 0308  NA 140  < > 137  K 3.9  < > 4.0  CL 97  < > 100  CO2 29  < > 27  BUN 26*  < > 51*  CREATININE 1.72*  < > 1.68*  CALCIUM 8.7  < > 9.1  PROT 6.1  --   --   BILITOT 0.9  --   --   ALKPHOS 65  --   --   ALT 10  --   --   AST 14  --   --   GLUCOSE 95  < > 117*  < > = values in this interval not displayed. Lab Results  Component Value Date   TROPONINI  <0.30 07/18/2013    Lab Results  Component Value Date   CHOL 196 03/18/2012   CHOL 152 07/21/2011   CHOL 190  12/01/2009   Lab Results  Component Value Date   HDL 79 03/18/2012   HDL 46.40 07/21/2011   HDL 39.10 12/01/2009   Lab Results  Component Value Date   LDLCALC 101* 03/18/2012   LDLCALC 82 07/21/2011   LDLCALC 114* 11/24/2008   Lab Results  Component Value Date   TRIG 78 03/18/2012   TRIG 116.0 07/21/2011   TRIG 226.0* 12/01/2009   Lab Results  Component Value Date   CHOLHDL 2.5 03/18/2012   CHOLHDL 3 07/21/2011   CHOLHDL 5 12/01/2009   Lab Results  Component Value Date   LDLDIRECT 123.2 12/01/2009   LDLDIRECT 115.7 11/13/2007   LDLDIRECT 96.0 08/03/2006      Radiology: Ct Abdomen Pelvis Wo Contrast  07/18/2013   CLINICAL DATA:  Mid abdominal pain, flank pain  EXAM: CT ABDOMEN AND PELVIS WITHOUT CONTRAST  TECHNIQUE: Multidetector CT imaging of the abdomen and pelvis was performed following the standard protocol without IV contrast.  COMPARISON:  None.  FINDINGS: Lower chest: Minor basilar atelectasis. No lower lobe pneumonia. Normal heart size. No pericardial or pleural effusion. Extensive atherosclerosis of the lower thoracic and abdominal aorta. Mild ectasia of the lower thoracic aorta at the diaphragmatic hiatus measuring 3.8 x 3.7 cm, image 13.  Abdomen: Several small layering calcified gallstones noted. No biliary dilatation or gallbladder distention. No inflammatory process in the right upper quadrant.  Liver, biliary system, pancreas, spleen, and adrenal glands are within normal limits for age and noncontrast imaging.  Kidneys demonstrate no acute obstruction, hydronephrosis, or obstructing ureteral calculus on either side. Ureters are symmetric and decompressed. No dilated ureter or hydroureter. Left kidney demonstrates a small hypodense parapelvic cyst measuring 16 mm with negative Hounsfield units, image 28. No other renal abnormality. Extensive calcific atherosclerosis of the  aorta and iliac vessels and tortuosity. No significant abdominal aneurysm.  Negative for bowel obstruction, dilatation, ileus, or free air. Scattered colonic diverticulosis, most pronounced on the right colon.  No abdominal free fluid, fluid collection, hemorrhage, abscess, or adenopathy.  Pelvis: Bilateral adnexal ovarian cysts noted. Right ovarian cyst measures 5.4 x 3.4 cm, image 60. There is a larger left ovarian cyst measuring 8.1 x 8.4 cm, image 61. These are concerning for abnormal ovarian cysts in a postmenopausal female. Ovarian cystic malignancy is not excluded. No associated ascites or peritoneal disease. Uterus is small and atrophic. Urinary bladder unremarkable. No acute distal bowel process.  Diffuse degenerative changes noted of the spine. Multilevel vacuum disc phenomena noted. Normal alignment.  IMPRESSION: Negative for acute obstructing ureteral calculus, obstructive uropathy, or hydronephrosis.  Probable incidental left renal parapelvic cyst measuring 16 mm.  Aortic calcific atherosclerosis and mild ectasia, maximal diameter 3.8 cm.  Cholelithiasis  Diffuse right colon diverticulosis  Bilateral ovarian cystic masses, largest on the left ovary measures 8.4 cm. These are concerning for a cystic ovarian malignancies in a postmenopausal female. No associated ascites or peritoneal disease. This warrants further gynecologic workup.   Electronically Signed   By: Daryll Brod M.D.   On: 07/18/2013 21:54   Dg Chest Port 1 View  07/25/2013   CLINICAL DATA:  Acute respiratory failure  EXAM: PORTABLE CHEST - 1 VIEW  COMPARISON:  07/23/2013  FINDINGS: Cardiac silhouette is normal in size and configuration. Normal mediastinal and hilar contours. Than diffusely and irregularly thickened interstitial markings are similar to the prior exam. Opacity at the left lung base obscures hemidiaphragm. This may be mildly increased.  Minimal effusions are suspected.  No pneumothorax.  Changes  from right breast surgery  are stable.  IMPRESSION: 1. Left basilar opacity which may reflect atelectasis or infiltrate. This appears mildly increased from the prior study although the difference may be technical only. Probable minimal effusions. 2. Irregularly thickened interstitial markings similar to the prior study. This suggests diffuse interstitial edema.   Electronically Signed   By: Lajean Manes M.D.   On: 07/25/2013 11:53    Dg Abd Acute W/chest  07/18/2013   CLINICAL DATA:  pain  EXAM: ACUTE ABDOMEN SERIES (ABDOMEN 2 VIEW & CHEST 1 VIEW)  COMPARISON:  DG CHEST 1V PORT dated 07/07/2012  FINDINGS: There is no evidence of dilated bowel loops or free intraperitoneal air. No radiopaque calculi or other significant radiographic abnormality is seen. Heart size and mediastinal contours are within normal limits. The lungs are hyperinflated and there is flattening of the hemidiaphragms. Lungs otherwise clear. Postsurgical changes in the right axilla.  IMPRESSION: Negative abdominal radiographs. No acute cardiopulmonary disease COPD.   Electronically Signed   By: Margaree Mackintosh M.D.   On: 07/18/2013 16:45     EKG:  SR rate 64 normal    ASSESSMENT AND PLAN:  Diastolic Dysfunction:  E/E' over 18  CXR with prominent interstitial markings but hard to tell if fluid vs chronic lung changes  BNP 4353  Cr gone from .94 to 1.6 Hold diuretics for 48 hours then resume at lower dose 20 mg daily.  Clinically she primarily has a COPD exacerbation which is slow to resolve Taper steroids  As quickly as possible to minimize fluid retention.   HTN:  Improved  Wean iv nitro to off Continue new oral meds.  Consider further w/u of RAS given clinical PVD and ulcers on toes  COPD:  Severe continue oxygen and nebs taper roids  Sees Dr Joya Gaskins  PVD:  Still smoking likely tibial disease Xray with no osteo  Activity more limited by dyspnea Follow   Signed: Josue Hector 07/25/2013, 2:24 PM

## 2013-07-25 NOTE — Plan of Care (Signed)
Problem: Phase I Progression Outcomes Goal: Hemodynamically stable Outcome: Progressing NTG drip off

## 2013-07-25 NOTE — Progress Notes (Signed)
Noted patient to go to SNF at discharge. Therefore, [facility] Care Management not appropriate at this time.  Marthenia Rolling, MSN- RN, BSN- [facility] 2 [phone]

## 2013-07-25 NOTE — Progress Notes (Addendum)
PROGRESS NOTE  Renee Donovan EGB:[id number] DOB: 05-09-1930 DOA: 07/18/2013 PCP: [name], MD  Assessment/Plan: Acute on Chronic respiratory failure-  -COPD exacerbation and fluid overload  - 07/23/2013 CXR continues to be read as "mild edema" - possibly chronic fibrosis - - received 3 doses of Lasix and BUN/ Cr ratio is now rising (becoming dehydrated) -  - ECHO showing Gr 2 diastolic dysfunction, EF 37-10%  - 07/23/2013 ABG 7.39/46/59/27 on 30% FiO2  - wean Solumedrol  - cont to follow in SDU and monitor closely  -goal SaO2 >92%--presently stable on 31% venturi mask--94% saturation  Acute diastolic CHF  -EF 62-69%, grade 2 diastolic dysfunction  -proBNP 4353-->?partly due to renal failure -gained 4 kg since day of admission -Daily weights -consult cardiology as pt still appears volume overloaded but now with cardiorenal syndrome Nausea and vomiting- possibly gastroenteritis?  - resolved  - advanced to cardiac diet Pyuria  - culture negative  - d/c Rocephin  ETOH abuse  - CIWA scale ordered  -Drinks at least 16 ounces wine daily  Hypothyroidism  - cont levothyroxine  Toe ulcer/PVD  - added ASA and statin  - ABIs quite low - outpt vascular surgery eval  - advised to stop smoking  Accelerated hypertension  - cont Clonidine patch and Verapamil  -Added metoprolol tartrate and Imdur lower to wean nitroglycerin drip  -wean NTG drip for SBP <160-->has gone from 39mcg/kg/min to 7.5 Acute on Chronic Renal Failure (CKD stage 3)  - Cr worse due to Lasix -likely cardiorenal syndrome   Family Communication:   Pt at beside Disposition Plan:   Home when medically stable   Antibiotics:  Levofloxacin 07/23/13>>>  Flagyl 07/23/13>>>    Procedures/Studies: Ct Abdomen Pelvis Wo Contrast  07/18/2013   CLINICAL DATA:  Mid abdominal pain, flank pain  EXAM: CT ABDOMEN AND PELVIS WITHOUT CONTRAST  TECHNIQUE: Multidetector CT imaging of the abdomen and pelvis was  performed following the standard protocol without IV contrast.  COMPARISON:  None.  FINDINGS: Lower chest: Minor basilar atelectasis. No lower lobe pneumonia. Normal heart size. No pericardial or pleural effusion. Extensive atherosclerosis of the lower thoracic and abdominal aorta. Mild ectasia of the lower thoracic aorta at the diaphragmatic hiatus measuring 3.8 x 3.7 cm, image 13.  Abdomen: Several small layering calcified gallstones noted. No biliary dilatation or gallbladder distention. No inflammatory process in the right upper quadrant.  Liver, biliary system, pancreas, spleen, and adrenal glands are within normal limits for age and noncontrast imaging.  Kidneys demonstrate no acute obstruction, hydronephrosis, or obstructing ureteral calculus on either side. Ureters are symmetric and decompressed. No dilated ureter or hydroureter. Left kidney demonstrates a small hypodense parapelvic cyst measuring 16 mm with negative Hounsfield units, image 28. No other renal abnormality. Extensive calcific atherosclerosis of the aorta and iliac vessels and tortuosity. No significant abdominal aneurysm.  Negative for bowel obstruction, dilatation, ileus, or free air. Scattered colonic diverticulosis, most pronounced on the right colon.  No abdominal free fluid, fluid collection, hemorrhage, abscess, or adenopathy.  Pelvis: Bilateral adnexal ovarian cysts noted. Right ovarian cyst measures 5.4 x 3.4 cm, image 60. There is a larger left ovarian cyst measuring 8.1 x 8.4 cm, image 61. These are concerning for abnormal ovarian cysts in a postmenopausal female. Ovarian cystic malignancy is not excluded. No associated ascites or peritoneal disease. Uterus is small and atrophic. Urinary bladder unremarkable. No acute distal bowel process.  Diffuse degenerative changes noted of  the spine. Multilevel vacuum disc phenomena noted. Normal alignment.  IMPRESSION: Negative for acute obstructing ureteral calculus, obstructive uropathy, or  hydronephrosis.  Probable incidental left renal parapelvic cyst measuring 16 mm.  Aortic calcific atherosclerosis and mild ectasia, maximal diameter 3.8 cm.  Cholelithiasis  Diffuse right colon diverticulosis  Bilateral ovarian cystic masses, largest on the left ovary measures 8.4 cm. These are concerning for a cystic ovarian malignancies in a postmenopausal female. No associated ascites or peritoneal disease. This warrants further gynecologic workup.   Electronically Signed   By: Daryll Brod M.D.   On: 07/18/2013 21:54   Dg Chest Port 1 View  07/23/2013   CLINICAL DATA:  hypoxia  EXAM: PORTABLE CHEST - 1 VIEW  COMPARISON:  DG CHEST 1V PORT dated 07/22/2013  FINDINGS: Low lung volumes. Cardiac silhouette is enlarged. Atherosclerotic calcifications within the aorta. Increased density left lung base and blunting of the left costophrenic angle. There is diffuse prominence of interstitial markings and areas of mild peribronchial cuffing.  IMPRESSION: Pulmonary edema  Atelectasis versus infiltrate left lung base and trace left effusion.   Electronically Signed   By: Margaree Mackintosh M.D.   On: 07/23/2013 10:06   Dg Chest Port 1 View  07/22/2013   CLINICAL DATA:  Hypoxia  EXAM: PORTABLE CHEST - 1 VIEW  COMPARISON:  July 21, 2013  FINDINGS: There is underlying emphysema. There is slightly less interstitial edema. There is patchy consolidation in the left base with small left effusion.  Heart is mildly enlarged. The pulmonary vascularity reflects pulmonary venous hypertension. There are surgical clips in the right axilla.  IMPRESSION: Congestive heart failure superimposed on underlying emphysematous change with slightly less edema compared to 1 day prior. Mild consolidation left base. No new opacity.   Electronically Signed   By: Lowella Grip M.D.   On: 07/22/2013 08:19   Dg Chest Port 1 View  07/21/2013   CLINICAL DATA:  Dyspnea  EXAM: PORTABLE CHEST - 1 VIEW  COMPARISON:  Prior chest x-ray 07/18/2013   FINDINGS: Stable mild cardiomegaly. Atherosclerotic calcifications noted in the transverse aorta. Interval development of diffuse bilateral interstitial opacities consistent with interstitial pulmonary edema. Dense left basilar opacity favored to reflect a combination of atelectasis developing alveolar edema. No pneumothorax. Small left effusion not excluded. Surgical clips again noted in the right axilla. No acute osseous abnormality.  IMPRESSION: 1. Interval development of mild CHF. 2. Left basilar opacity favored to reflect a combination of atelectasis and developing alveolar edema. Small pleural effusion not excluded.   Electronically Signed   By: Jacqulynn Cadet M.D.   On: 07/21/2013 08:19   Dg Abd Acute W/chest  07/18/2013   CLINICAL DATA:  pain  EXAM: ACUTE ABDOMEN SERIES (ABDOMEN 2 VIEW & CHEST 1 VIEW)  COMPARISON:  DG CHEST 1V PORT dated 07/07/2012  FINDINGS: There is no evidence of dilated bowel loops or free intraperitoneal air. No radiopaque calculi or other significant radiographic abnormality is seen. Heart size and mediastinal contours are within normal limits. The lungs are hyperinflated and there is flattening of the hemidiaphragms. Lungs otherwise clear. Postsurgical changes in the right axilla.  IMPRESSION: Negative abdominal radiographs. No acute cardiopulmonary disease COPD.   Electronically Signed   By: Margaree Mackintosh M.D.   On: 07/18/2013 16:45   Dg Foot Complete Left  07/18/2013   CLINICAL DATA:  Ulcerations.  EXAM: LEFT FOOT - COMPLETE 3+ VIEW  COMPARISON:  None.  FINDINGS: Diffuse severe osteopenia and degenerative change. No acute bony or  joint abnormality. If osteomyelitis is of clinical concern MRI can be obtained.  IMPRESSION: Diffuse osteopenia and degenerative change, no acute bony abnormality identified.   Electronically Signed   By: Marcello Moores  Register   On: 07/18/2013 21:59   Dg Foot Complete Right  07/18/2013   CLINICAL DATA:  Toe ulceration  EXAM: RIGHT FOOT COMPLETE - 3+  VIEW  COMPARISON:  None.  FINDINGS: No acute fracture. No dislocation. Deformity of the phalanges of the small toe has a chronic appearance. Severe osteopenia. Nonspecific subtle erosion at the lateral head of the proximal phalanx of the great toe.  IMPRESSION: No acute bony pathology.  Chronic changes.   Electronically Signed   By: Maryclare Bean M.D.   On: 07/18/2013 21:59         Subjective: This is a 55, to better. Denies any fevers, chills, chest pain, shortness breath, nausea, vomiting, diarrhea, abdominal pain.  Objective: Filed Vitals:   07/25/13 0619 07/25/13 0646 07/25/13 0800 07/25/13 1000  BP: 164/65  129/71 160/72  Pulse:   58 59  Temp:   98.3 F (36.8 C)   TempSrc:   Oral   Resp:   14 16  Height:      Weight:      SpO2:  91% 93% 94%    Intake/Output Summary (Last 24 hours) at 07/25/13 1137 Last data filed at 07/25/13 1100  Gross per 24 hour  Intake  880.5 ml  Output      0 ml  Net  880.5 ml   Weight change: -1.5 kg (-3 lb 4.9 oz) Exam:   General:  Pt is alert, follows commands appropriately, not in acute distress  HEENT: No icterus, No thrush,  [place]/AT  Cardiovascular: RRR, S1/S2, no rubs, no gallops  Respiratory: Bilateral crackles. No wheezing. Good air movement.  Abdomen: Soft/+BS, non tender, non distended, no guarding  Extremities: No edema, No lymphangitis, No petechiae, No rashes, no synovitis  Data Reviewed: Basic Metabolic Panel:  Recent Labs Lab 07/21/13 0806 07/22/13 0959 07/23/13 0310 07/24/13 0318 07/25/13 0308  NA 137 136* 136* 139 137  K 4.4 4.0 4.8 4.1 4.0  CL 100 97 98 100 100  CO2 26 25 25 26 27   GLUCOSE 99 147* 135* 139* 117*  BUN 21 25* 34* 46* 51*  CREATININE 1.15* 1.30* 1.43* 1.67* 1.68*  CALCIUM 8.9 9.3 9.1 8.9 9.1   Liver Function Tests:  Recent Labs Lab 07/18/13 1322 07/19/13 0434  AST 20 14  ALT 14 10  ALKPHOS 86 65  BILITOT 1.5* 0.9  PROT 7.7 6.1  ALBUMIN 4.1 3.4*    Recent Labs Lab 07/18/13 1322    LIPASE 30   No results found for this basename: AMMONIA,  in the last 168 hours CBC:  Recent Labs Lab 07/18/13 1322 07/19/13 0434 07/20/13 1315 07/22/13 0959 07/23/13 0310 07/24/13 0318  WBC 12.1* 15.4* 11.1* 12.8* 13.3* 13.1*  NEUTROABS 10.2* 11.9*  --   --   --   --   HGB 14.8 12.8 12.4 12.9 12.1 12.3  HCT 44.4 38.5 37.0 38.6 37.1 37.6  MCV 93.1 93.9 94.9 93.0 95.1 95.2  PLT 253 248 212 238 236 267   Cardiac Enzymes:  Recent Labs Lab 07/18/13 1322  TROPONINI <0.30   BNP: No components found with this basename: POCBNP,  CBG:  Recent Labs Lab 07/23/13 2347 07/24/13 1215 07/24/13 1805 07/25/13 0003 07/25/13 0612  GLUCAP 147* 116* 128* 112* 111*    Recent Results (from the  past 240 hour(s))  URINE CULTURE     Status: None   Collection Time    07/18/13  6:25 PM      Result Value Ref Range Status   Specimen Description URINE, CLEAN CATCH   Final   Special Requests Normal   Final   Culture  Setup Time     Final   Value: 07/18/2013 22:05     Performed at [place]     Final   Value: 8,000 COLONIES/ML     Performed at [facility]   Culture     Final   Value: INSIGNIFICANT GROWTH     Performed at [facility]   Report Status 07/20/2013 FINAL   Final  MRSA PCR SCREENING     Status: None   Collection Time    07/21/13  8:48 AM      Result Value Ref Range Status   MRSA by PCR NEGATIVE  NEGATIVE Final   Comment:            The GeneXpert MRSA Assay (FDA     approved for NASAL specimens     only), is one component of a     comprehensive MRSA colonization     surveillance program. It is not     intended to diagnose MRSA     infection nor to guide or     monitor treatment for     MRSA infections.     Scheduled Meds: . antiseptic oral rinse  15 mL Mouth Rinse q12n4p  . aspirin  81 mg Oral Daily  . atorvastatin  10 mg Oral q1800  . buPROPion  300 mg Oral Daily  . chlorhexidine  15 mL Mouth Rinse BID  .  cloNIDine  0.1 mg Transdermal Weekly  . enoxaparin (LOVENOX) injection  30 mg Subcutaneous Q24H  . folic acid  1 mg Oral Daily  . hydrALAZINE  50 mg Oral 4 times per day  . ipratropium-albuterol  3 mL Nebulization Q4H  . isosorbide mononitrate  60 mg Oral Daily  . levofloxacin  750 mg Oral Q48H  . levothyroxine  50 mcg Oral QAC breakfast  . lovastatin  40 mg Oral QHS  . methylPREDNISolone (SOLU-MEDROL) injection  40 mg Intravenous Q12H  . metoprolol tartrate  12.5 mg Oral BID  . metroNIDAZOLE  500 mg Oral 3 times per day  . multivitamin with minerals  1 tablet Oral Daily  . thiamine  100 mg Oral Daily   Or  . thiamine  100 mg Intravenous Daily  . verapamil  240 mg Oral BID   Continuous Infusions: . nitroGLYCERIN 83.333 mcg/min (07/25/13 0850)     Orson Eva, DO  [facility] Pager [phone]  If 7PM-7AM, please contact night-coverage [email or web address] Password [email or web address] 07/25/2013, 11:37 AM   LOS: 7 days

## 2013-07-26 DIAGNOSIS — J441 Chronic obstructive pulmonary disease with (acute) exacerbation: Secondary | ICD-10-CM

## 2013-07-26 LAB — CBC WITH DIFFERENTIAL/PLATELET
Basophils Absolute: 0 10*3/uL (ref 0.0–0.1)
Basophils Relative: 0 % (ref 0–1)
Eosinophils Absolute: 0 10*3/uL (ref 0.0–0.7)
Eosinophils Relative: 0 % (ref 0–5)
HCT: 37.7 % (ref 36.0–46.0)
Hemoglobin: 12.7 g/dL (ref 12.0–15.0)
Lymphocytes Relative: 11 % — ABNORMAL LOW (ref 12–46)
Lymphs Abs: 1.7 10*3/uL (ref 0.7–4.0)
MCH: 31.4 pg (ref 26.0–34.0)
MCHC: 33.7 g/dL (ref 30.0–36.0)
MCV: 93.3 fL (ref 78.0–100.0)
Monocytes Absolute: 0.7 10*3/uL (ref 0.1–1.0)
Monocytes Relative: 4 % (ref 3–12)
Neutro Abs: 14.1 10*3/uL — ABNORMAL HIGH (ref 1.7–7.7)
Neutrophils Relative %: 85 % — ABNORMAL HIGH (ref 43–77)
Platelets: 296 10*3/uL (ref 150–400)
RBC: 4.04 MIL/uL (ref 3.87–5.11)
RDW: 13.6 % (ref 11.5–15.5)
WBC: 16.6 10*3/uL — ABNORMAL HIGH (ref 4.0–10.5)

## 2013-07-26 LAB — GLUCOSE, CAPILLARY
GLUCOSE-CAPILLARY: 119 mg/dL — AB (ref 70–99)
GLUCOSE-CAPILLARY: 114 mg/dL — AB (ref 70–99)
Glucose-Capillary: 84 mg/dL (ref 70–99)
Glucose-Capillary: 132 mg/dL — ABNORMAL HIGH (ref 70–99)

## 2013-07-26 LAB — BASIC METABOLIC PANEL
BUN: 58 mg/dL — ABNORMAL HIGH (ref 6–23)
CALCIUM: 8.8 mg/dL (ref 8.4–10.5)
CO2: 27 meq/L (ref 19–32)
CREATININE: 1.82 mg/dL — AB (ref 0.50–1.10)
Chloride: 99 mEq/L (ref 96–112)
GFR calc Af Amer: 29 mL/min — ABNORMAL LOW (ref >90)
GFR calc non Af Amer: 25 mL/min — ABNORMAL LOW (ref >90)
GLUCOSE: 102 mg/dL — AB (ref 70–99)
Potassium: 4.2 mEq/L (ref 3.7–5.3)
Sodium: 136 mEq/L — ABNORMAL LOW (ref 137–147)

## 2013-07-26 MED ORDER — HYDRALAZINE HCL 50 MG PO TABS
75.0000 mg | ORAL_TABLET | Freq: Three times a day (TID) | ORAL | Status: DC
  Administered 2013-07-26 – 2013-07-28 (×6): 75 mg via ORAL
  Filled 2013-07-26 (×9): qty 1

## 2013-07-26 NOTE — Progress Notes (Addendum)
OT Cancellation Note  Patient Details Name: NAASIA WEILBACHER MRN: [id number] DOB: 01/13/1931   Cancelled Treatment:    Reason Eval/Treat Not Completed: Other (comment).  Pt just moved upstairs from SDU.  Will likely check back on her tomorrow for OT eval.    Lesle Chris 07/26/2013, 2:31 PM Lesle Chris, OTR/L [phone] 07/26/2013

## 2013-07-26 NOTE — Progress Notes (Signed)
DAILY PROGRESS NOTE  Subjective:  Creatinine continues to rise despite holding diuretics. She is now +3L since admission. Echo shows pseudonormal diastolic filling pattern consistent with diastolic congestive heart failure.  Objective:  Temp:  [97.4 F (36.3 C)-98.3 F (36.8 C)] 98 F (36.7 C) (05/01 0600) Pulse Rate:  [50-64] 56 (05/01 0600) Resp:  [14-19] 19 (05/01 0600) BP: (124-212)/(47-81) 145/47 mmHg (05/01 0600) SpO2:  [93 %-97 %] 93 % (05/01 0600) FiO2 (%):  [31 %] 31 % (04/30 1210) Weight:  [176 lb 12.9 oz (80.2 kg)] 176 lb 12.9 oz (80.2 kg) (05/01 0600) Weight change: -1 lb 15.8 oz (-0.9 kg)  Intake/Output from previous day: 04/30 0701 - 05/01 0700 In: 946.5 [P.O.:620; I.V.:76.5; IV Piggyback:250] Out: -   Intake/Output from this shift:    Medications: Current Facility-Administered Medications  Medication Dose Route Frequency Provider Last Rate Last Dose  . acetaminophen (TYLENOL) tablet 650 mg  650 mg Oral Q6H PRN Rise Patience, MD   650 mg at 07/24/13 0044   Or  . acetaminophen (TYLENOL) suppository 650 mg  650 mg Rectal Q6H PRN Rise Patience, MD      . albuterol (PROVENTIL) (2.5 MG/3ML) 0.083% nebulizer solution 2.5 mg  2.5 mg Nebulization Q2H PRN Debbe Odea, MD      . antiseptic oral rinse (BIOTENE) solution 15 mL  15 mL Mouth Rinse q12n4p Saima Rizwan, MD   15 mL at 07/24/13 1600  . aspirin chewable tablet 81 mg  81 mg Oral Daily Debbe Odea, MD   81 mg at 07/25/13 1052  . atorvastatin (LIPITOR) tablet 10 mg  10 mg Oral q1800 Debbe Odea, MD   10 mg at 07/25/13 1748  . buPROPion (WELLBUTRIN XL) 24 hr tablet 300 mg  300 mg Oral Daily Rise Patience, MD   300 mg at 07/25/13 1051  . calcium carbonate (TUMS - dosed in mg elemental calcium) chewable tablet 400 mg of elemental calcium  2 tablet Oral Q8H PRN Orson Eva, MD   400 mg of elemental calcium at 07/25/13 2023  . chlorhexidine (PERIDEX) 0.12 % solution 15 mL  15 mL Mouth Rinse BID  Debbe Odea, MD   15 mL at 07/25/13 2019  . cloNIDine (CATAPRES - Dosed in mg/24 hr) patch 0.1 mg  0.1 mg Transdermal Weekly Debbe Odea, MD   0.1 mg at 07/23/13 0906  . enoxaparin (LOVENOX) injection 30 mg  30 mg Subcutaneous Q24H Julio Sicks, RPH   30 mg at 07/25/13 2200  . folic acid (FOLVITE) tablet 1 mg  1 mg Oral Daily Rise Patience, MD   1 mg at 07/25/13 1051  . hydrALAZINE (APRESOLINE) injection 10 mg  10 mg Intravenous Q4H PRN Rise Patience, MD   10 mg at 07/25/13 2038  . hydrALAZINE (APRESOLINE) tablet 50 mg  50 mg Oral 4 times per day Debbe Odea, MD   50 mg at 07/26/13 0548  . ipratropium-albuterol (DUONEB) 0.5-2.5 (3) MG/3ML nebulizer solution 3 mL  3 mL Nebulization Q4H Debbe Odea, MD   3 mL at 07/26/13 0343  . isosorbide mononitrate (IMDUR) 24 hr tablet 60 mg  60 mg Oral Daily Orson Eva, MD   60 mg at 07/25/13 1052  . levofloxacin (LEVAQUIN) tablet 750 mg  750 mg Oral Q48H Julio Sicks, RPH   750 mg at 07/25/13 1402  . levothyroxine (SYNTHROID, LEVOTHROID) tablet 50 mcg  50 mcg Oral QAC breakfast Rise Patience, MD  50 mcg at 07/25/13 0840  . LORazepam (ATIVAN) injection 2-3 mg  2-3 mg Intravenous Q1H PRN Jeryl Columbia, NP   2 mg at 07/25/13 2254  . lovastatin (MEVACOR) tablet 40 mg  40 mg Oral QHS Rise Patience, MD   40 mg at 07/25/13 2200  . metoprolol tartrate (LOPRESSOR) tablet 12.5 mg  12.5 mg Oral BID Orson Eva, MD   12.5 mg at 07/25/13 1051  . metroNIDAZOLE (FLAGYL) tablet 500 mg  500 mg Oral 3 times per day Debbe Odea, MD   500 mg at 07/26/13 0548  . morphine 2 MG/ML injection 2 mg  2 mg Intravenous Q4H PRN Gardiner Barefoot, NP   2 mg at 07/25/13 1920  . multivitamin with minerals tablet 1 tablet  1 tablet Oral Daily Rise Patience, MD   1 tablet at 07/25/13 1052  . nitroGLYCERIN 0.2 mg/mL in dextrose 5 % infusion  2-200 mcg/min Intravenous Titrated Orson Eva, MD   5 mcg/min at 07/26/13 0200  . ondansetron (ZOFRAN) tablet 4 mg  4  mg Oral Q6H PRN Rise Patience, MD   4 mg at 07/24/13 2300   Or  . ondansetron (ZOFRAN) injection 4 mg  4 mg Intravenous Q6H PRN Rise Patience, MD      . predniSONE (DELTASONE) tablet 40 mg  40 mg Oral Q breakfast Shanon Brow Tat, MD      . sodium chloride 0.9 % bolus 1,000 mL  1,000 mL Intravenous PRN Debbe Odea, MD      . thiamine (VITAMIN B-1) tablet 100 mg  100 mg Oral Daily Rise Patience, MD   100 mg at 07/25/13 1052   Or  . thiamine (B-1) injection 100 mg  100 mg Intravenous Daily Rise Patience, MD      . verapamil (CALAN) tablet 240 mg  240 mg Oral BID Rise Patience, MD   240 mg at 07/25/13 2200    Physical Exam: General appearance: alert and no distress Neck: no carotid bruit and no JVD Lungs: diminished breath sounds bilaterally Heart: regular rate and rhythm Extremities: extremities normal, atraumatic, no cyanosis or edema Skin: Skin color, texture, turgor normal. No rashes or lesions  Lab Results: Results for orders placed during the hospital encounter of 07/18/13 (from the past 48 hour(s))  GLUCOSE, CAPILLARY     Status: Abnormal   Collection Time    07/24/13 12:15 PM      Result Value Ref Range   Glucose-Capillary 116 (*) 70 - 99 mg/dL  GLUCOSE, CAPILLARY     Status: Abnormal   Collection Time    07/24/13  6:05 PM      Result Value Ref Range   Glucose-Capillary 128 (*) 70 - 99 mg/dL  GLUCOSE, CAPILLARY     Status: Abnormal   Collection Time    07/25/13 12:03 AM      Result Value Ref Range   Glucose-Capillary 112 (*) 70 - 99 mg/dL  BASIC METABOLIC PANEL     Status: Abnormal   Collection Time    07/25/13  3:08 AM      Result Value Ref Range   Sodium 137  137 - 147 mEq/L   Potassium 4.0  3.7 - 5.3 mEq/L   Chloride 100  96 - 112 mEq/L   CO2 27  19 - 32 mEq/L   Glucose, Bld 117 (*) 70 - 99 mg/dL   BUN 51 (*) 6 - 23 mg/dL   Creatinine, Ser 1.68 (*)  0.50 - 1.10 mg/dL   Calcium 9.1  8.4 - 10.5 mg/dL   GFR calc non Af Amer 27 (*) >90 mL/min    GFR calc Af Amer 32 (*) >90 mL/min   Comment: (NOTE)     The eGFR has been calculated using the CKD EPI equation.     This calculation has not been validated in all clinical situations.     eGFR's persistently <90 mL/min signify possible Chronic Kidney     Disease.  PRO B NATRIURETIC PEPTIDE     Status: Abnormal   Collection Time    07/25/13  3:08 AM      Result Value Ref Range   Pro B Natriuretic peptide (BNP) 4353.0 (*) 0 - 450 pg/mL  GLUCOSE, CAPILLARY     Status: Abnormal   Collection Time    07/25/13  6:12 AM      Result Value Ref Range   Glucose-Capillary 111 (*) 70 - 99 mg/dL  GLUCOSE, CAPILLARY     Status: Abnormal   Collection Time    07/25/13 12:34 PM      Result Value Ref Range   Glucose-Capillary 113 (*) 70 - 99 mg/dL   Comment 1 Documented in Chart     Comment 2 Notify RN    GLUCOSE, CAPILLARY     Status: Abnormal   Collection Time    07/25/13  4:35 PM      Result Value Ref Range   Glucose-Capillary 139 (*) 70 - 99 mg/dL   Comment 1 Documented in Chart     Comment 2 Notify RN    GLUCOSE, CAPILLARY     Status: Abnormal   Collection Time    07/25/13 10:16 PM      Result Value Ref Range   Glucose-Capillary 103 (*) 70 - 99 mg/dL  BASIC METABOLIC PANEL     Status: Abnormal   Collection Time    07/26/13  3:22 AM      Result Value Ref Range   Sodium 136 (*) 137 - 147 mEq/L   Potassium 4.2  3.7 - 5.3 mEq/L   Chloride 99  96 - 112 mEq/L   CO2 27  19 - 32 mEq/L   Glucose, Bld 102 (*) 70 - 99 mg/dL   BUN 58 (*) 6 - 23 mg/dL   Creatinine, Ser 1.82 (*) 0.50 - 1.10 mg/dL   Calcium 8.8  8.4 - 10.5 mg/dL   GFR calc non Af Amer 25 (*) >90 mL/min   GFR calc Af Amer 29 (*) >90 mL/min   Comment: (NOTE)     The eGFR has been calculated using the CKD EPI equation.     This calculation has not been validated in all clinical situations.     eGFR's persistently <90 mL/min signify possible Chronic Kidney     Disease.  CBC WITH DIFFERENTIAL     Status: Abnormal    Collection Time    07/26/13  3:22 AM      Result Value Ref Range   WBC 16.6 (*) 4.0 - 10.5 K/uL   RBC 4.04  3.87 - 5.11 MIL/uL   Hemoglobin 12.7  12.0 - 15.0 g/dL   HCT 37.7  36.0 - 46.0 %   MCV 93.3  78.0 - 100.0 fL   MCH 31.4  26.0 - 34.0 pg   MCHC 33.7  30.0 - 36.0 g/dL   RDW 13.6  11.5 - 15.5 %   Platelets 296  150 - 400 K/uL  Neutrophils Relative % 85 (*) 43 - 77 %   Neutro Abs 14.1 (*) 1.7 - 7.7 K/uL   Lymphocytes Relative 11 (*) 12 - 46 %   Lymphs Abs 1.7  0.7 - 4.0 K/uL   Monocytes Relative 4  3 - 12 %   Monocytes Absolute 0.7  0.1 - 1.0 K/uL   Eosinophils Relative 0  0 - 5 %   Eosinophils Absolute 0.0  0.0 - 0.7 K/uL   Basophils Relative 0  0 - 1 %   Basophils Absolute 0.0  0.0 - 0.1 K/uL    Imaging: Dg Chest Port 1 View  07/25/2013   CLINICAL DATA:  Acute respiratory failure  EXAM: PORTABLE CHEST - 1 VIEW  COMPARISON:  07/23/2013  FINDINGS: Cardiac silhouette is normal in size and configuration. Normal mediastinal and hilar contours. Than diffusely and irregularly thickened interstitial markings are similar to the prior exam. Opacity at the left lung base obscures hemidiaphragm. This may be mildly increased.  Minimal effusions are suspected.  No pneumothorax.  Changes from right breast surgery are stable.  IMPRESSION: 1. Left basilar opacity which may reflect atelectasis or infiltrate. This appears mildly increased from the prior study although the difference may be technical only. Probable minimal effusions. 2. Irregularly thickened interstitial markings similar to the prior study. This suggests diffuse interstitial edema.   Electronically Signed   By: Lajean Manes M.D.   On: 07/25/2013 11:53    Assessment:  1. Principal Problem: 2.   Nausea and vomiting 3. Active Problems: 4.   Hypothyroidism 5.   COPD (chronic obstructive pulmonary disease) with emphysema 6.   Toe ulcer 7.   Accelerated hypertension 8.   Nausea & vomiting 9.   Acute and chronic respiratory  failure 10.   ARF (acute renal failure) 11.   Acute diastolic CHF (congestive heart failure) 12.   COPD exacerbation 13.   Plan:  1. Continue to hold diuretics. May need renal evaluation for progressive renal dysfunction. Do not suspect cardiorenal syndrome given normal EF and presumed normal cardiac output.  If there is a question of persistent volume overload, a right heart cath or CVP would be helpful in determining volume status - however, on clinical exam, there does not appear to be volume overload.  Time Spent Directly with Patient:  15 minutes  Length of Stay:  LOS: 8 days   Pixie Casino, MD, [facility] Attending Cardiologist [place] 07/26/2013, 7:25 AM

## 2013-07-26 NOTE — Progress Notes (Signed)
I have reviewed this note and agree with all findings. Kati Ines Warf, PT, DPT Pager: [phone]   

## 2013-07-26 NOTE — Progress Notes (Signed)
Physical Therapy Treatment Patient Details Name: Renee Donovan MRN: [id number] DOB: December 12, 1930 Today's Date: 07/26/2013    History of Present Illness Pt is an 80 year old female admitted 4/23 for nausea and vomiting with history of COPD, hypothyroidism, chronic alcoholism, tobacco abuse, history of breast cancer, hypertension.  Pt also with toe ulcers suspicious for PVD per chart review.  Pt usually on 2L O2 at home and currently on 4L O2 in room.    PT Comments    Pt reports she has not been OOB since last PT visit (4/25).  Required min assist with bed mobility to manage LE.  Pt was SOB throughout treatment requiring two rest breaks during ambulation and an increase of O2 from 4L to 6L (see Pertinent Vitals below).  Pt complained of weakness and fatigue, requiring increased time for breaks and mobility.  Follow Up Recommendations  SNF;Supervision for mobility/OOB     Equipment Recommendations  None recommended by PT    Recommendations for Other Services       Precautions / Restrictions Precautions Precautions: Fall Precaution Comments: chronic O2, check sats Restrictions Weight Bearing Restrictions: No    Mobility  Bed Mobility Overal bed mobility: Needs Assistance Bed Mobility: Supine to Sit;Sit to Supine     Supine to sit: Min assist Sit to supine: Min assist   General bed mobility comments: used therapist hands to pull trunk upright, assist to manage LEs; chronicly SOB, SpO2 90% on 4L O2 at EOB   Transfers Overall transfer level: Needs assistance Equipment used: Rolling walker (2 wheeled) Transfers: Sit to/from Stand Sit to Stand: Min assist         General transfer comment: assist to steady, poor eccentric control for descent despite multiple verbal cues, increased verbal and tactile cues for hand placement  Ambulation/Gait Ambulation/Gait assistance: Min assist Ambulation Distance (Feet): 35 Feet Assistive device: Rolling walker (2 wheeled) Gait  Pattern/deviations: Step-through pattern;Trunk flexed;Drifts right/left     General Gait Details: pt reports feeling weak and unsteady, took two seated breaks (walked 12', 19', 4'), SpO2 dropped to 86%, O2 increased to 6L O2, multiple verbal cues for pused lip breathing (however had difficulty, reports nasal congestion)   Stairs            Wheelchair Mobility    Modified Rankin (Stroke Patients Only)       Balance                                    Cognition Arousal/Alertness: Awake/alert Behavior During Therapy: WFL for tasks assessed/performed Overall Cognitive Status: Within Functional Limits for tasks assessed                      Exercises      General Comments        Pertinent Vitals/Pain SpO2 on 4L at rest: 90% SpO2 on 4L during ambulation: 86% SpO2 on 6L during ambulation: 94% Activity to tolerance.  No complaints of pain.    Home Living                      Prior Function            PT Goals (current goals can now be found in the care plan section) Progress towards PT goals: Progressing toward goals    Frequency  Min 3X/week    PT Plan Current plan remains appropriate  Co-evaluation             End of Session Equipment Utilized During Treatment: Gait belt;Oxygen Activity Tolerance: Patient limited by fatigue Patient left: in bed;with call bell/phone within reach     Time: 1119-1147 PT Time Calculation (min): 28 min  Charges:  $Gait Training: 23-37 mins                    G CodesJacqulyn Cane 07/31/2013, 1:13 PM Jacqulyn Cane SPT 07-31-2013

## 2013-07-26 NOTE — Progress Notes (Signed)
ANTIBIOTIC CONSULT NOTE   Pharmacy Consult for Levaquin Indication: pneumonia  No Known Allergies  Patient Measurements: Height: 5\' 6"  (167.6 cm) Weight: 176 lb 12.9 oz (80.2 kg) IBW/kg (Calculated) : 59.3  Vital Signs: Temp: 98 F (36.7 C) (05/01 0800) Temp src: Oral (05/01 0800) BP: 160/56 mmHg (05/01 1300) Pulse Rate: 55 (05/01 1300) Intake/Output from previous day: 04/30 0701 - 05/01 0700 In: 956.5 [P.O.:620; I.V.:76.5; IV Piggyback:260] Out: -  Intake/Output from this shift:    Labs:  Recent Labs  07/24/13 0318 07/25/13 0308 07/26/13 0322  WBC 13.1*  --  16.6*  HGB 12.3  --  12.7  PLT 267  --  296  CREATININE 1.67* 1.68* 1.82*   Estimated Creatinine Clearance: 25.9 ml/min (by C-G formula based on Cr of 1.82). No results found for this basename: Letta Median, VANCORANDOM, GENTTROUGH, GENTPEAK, GENTRANDOM, TOBRATROUGH, TOBRAPEAK, TOBRARND, AMIKACINPEAK, AMIKACINTROU, AMIKACIN,  in the last 72 hours   Microbiology: Recent Results (from the past 720 hour(s))  URINE CULTURE     Status: None   Collection Time    07/18/13  6:25 PM      Result Value Ref Range Status   Specimen Description URINE, CLEAN CATCH   Final   Special Requests Normal   Final   Culture  Setup Time     Final   Value: 07/18/2013 22:05     Performed at [place]     Final   Value: 8,000 COLONIES/ML     Performed at [facility]   Culture     Final   Value: INSIGNIFICANT GROWTH     Performed at [facility]   Report Status 07/20/2013 FINAL   Final  MRSA PCR SCREENING     Status: None   Collection Time    07/21/13  8:48 AM      Result Value Ref Range Status   MRSA by PCR NEGATIVE  NEGATIVE Final   Comment:            The GeneXpert MRSA Assay (FDA     approved for NASAL specimens     only), is one component of a     comprehensive MRSA colonization     surveillance program. It is not     intended to diagnose MRSA     infection nor to  guide or     monitor treatment for     MRSA infections.    Medical History: Past Medical History  Diagnosis Date  . Hyperlipidemia   . Hypertension   . DDD (degenerative disc disease)   . DJD (degenerative joint disease)     ankles  . Cluster headache     remote  . COPD (chronic obstructive pulmonary disease)     Golds Stage II feV1 63% 11/2009  . Thyroid disorder   . Breast cancer 1991    s/p R lumpectomy and radiation     Medications:  Scheduled:  . antiseptic oral rinse  15 mL Mouth Rinse q12n4p  . aspirin  81 mg Oral Daily  . atorvastatin  10 mg Oral q1800  . buPROPion  300 mg Oral Daily  . chlorhexidine  15 mL Mouth Rinse BID  . cloNIDine  0.1 mg Transdermal Weekly  . enoxaparin (LOVENOX) injection  30 mg Subcutaneous Q24H  . folic acid  1 mg Oral Daily  . hydrALAZINE  75 mg Oral 3 times per day  . ipratropium-albuterol  3 mL Nebulization Q4H  .  isosorbide mononitrate  60 mg Oral Daily  . levofloxacin  750 mg Oral Q48H  . levothyroxine  50 mcg Oral QAC breakfast  . lovastatin  40 mg Oral QHS  . metoprolol tartrate  12.5 mg Oral BID  . metroNIDAZOLE  500 mg Oral 3 times per day  . multivitamin with minerals  1 tablet Oral Daily  . predniSONE  40 mg Oral Q breakfast  . thiamine  100 mg Oral Daily   Or  . thiamine  100 mg Intravenous Daily  . verapamil  240 mg Oral BID   Infusions:  . nitroGLYCERIN Stopped (07/26/13 0400)   PRN: acetaminophen, acetaminophen, albuterol, calcium carbonate, hydrALAZINE, LORazepam, morphine injection, ondansetron (ZOFRAN) IV, ondansetron, sodium chloride  Assessment: 80 y.o. female presenting on 4/23 with history of COPD, hypothyroidism, chronic alcoholism, tobacco abuse, h/o breat CA, HTN, started experiencing N/V and left flank pain on 4/23. As her GI symptoms resolved, she developed acute dyspnea on the morning of 4/26. CXR notes possible infiltrate in LLL. abx started for possible aspiration pneumonia.  D4 Antibiotics 4/28  >>Levaquin PO>> 4/28>> Flagyl PO (MD)>>  Tmax: AF WBCs: Bumped to 16.6K Renal: Increase to 1.82, CrCl 26 (N27)  4/26 MRSA PCR: neg 4/23 urine: insignificant growth F  Goal of Therapy:  Adjust medications based on renal function  Plan:  Continue levaquin 750mg  PO q48h, flagyl 500mg  PO q8h to complete 7 day course per MD F/u renal fxn, adjust levaquin if CrCl falls <20   Ralene Bathe, PharmD, BCPS 07/26/2013, 1:41 PM  Pager: [phone]

## 2013-07-26 NOTE — Progress Notes (Signed)
CSW continuing to follow for disposition planning. Pt plans for short term rehab at [facility] when medically stable.  CSW met with pt at bedside. CSW provided support and pt discussed that she feels she is getting better. CSW discussed plan for [facility] and pt initially stated "I don't know" and then agreed that she planned to go to rehab at [facility] as she is not yet strong enough to return home alone. CSW discussed that per MD, pt not yet medically ready. CSW ensured pt that CSW will update [facility] and facility is reporting that they will have bed available when pt medically stable. Pt agreeable to CSW updated pt grandson, John via telephone.  CSW contacted pt grandson, John via telephone. CSW updated that pt not yet medically ready for discharge and per MD, do not anticipate discharge until Monday at the earliest. Pt grandson expressed understanding. Pt grandson plans to assist pt with transition to [facility] once pt medically stable for discharge.  CSW to continue to follow and facilitate pt discharge needs to [facility] when pt medically stable for discharge.  Alison Murray, MSW, [employer] [phone]

## 2013-07-26 NOTE — Progress Notes (Signed)
PROGRESS NOTE  Renee Donovan YWV:[id number] DOB: [birth year] DOA: 07/18/2013 PCP: [name], MD  Assessment/Plan: Acute on Chronic respiratory failure-  -COPD exacerbation and fluid overload  - 07/23/2013 CXR continues to be read as "mild edema" - possibly chronic fibrosis - - received 3 doses of Lasix IV and BUN/ Cr increasing  - ECHO showing Gr 2 diastolic dysfunction, EF 85-46%  - 07/23/2013 ABG 7.39/46/59/27 on 30% FiO2  - continue prednisone -goal SaO2 >92%--presently stable on 4L--94% saturation  Acute diastolic CHF  -EF 27-03%, grade 2 diastolic dysfunction  -proBNP 4353-->?partly due to renal failure  -gained 4 kg since day of admission  -Daily weights  -appreciate cardiology input Nausea and vomiting- possibly gastroenteritis?  - resolved  - advanced to cardiac diet  Pyuria  - culture negative  - d/c Rocephin  ETOH abuse  - CIWA scale ordered  -Drinks at least 16 ounces wine daily  Hypothyroidism  - cont levothyroxine  Toe ulcer/PVD  - added ASA and statin  - ABIs quite low - outpt vascular surgery eval  - advised to stop smoking  Accelerated hypertension  - cont Clonidine patch and Verapamil  -Added metoprolol tartrate and Imdur lower to wean nitroglycerin drip  -wean NTG drip for SBP <160-->weaned off 07/25/2013 -Adjust hydralazine to 75 mg every 8 hours for Acute on Chronic Renal Failure (CKD stage 3)  - Cr worse due to Lasix  -renal ultrasound -am BMP -03/26/12 renal arterial duplex negative for RAS Family Communication: Pt at beside  Disposition Plan: ultimately SNF, transfer to [facility] Antibiotics:  Levofloxacin 07/23/13>>>  Flagyl 07/23/13>>>       Procedures/Studies: Ct Abdomen Pelvis Wo Contrast  07/18/2013   CLINICAL DATA:  Mid abdominal pain, flank pain  EXAM: CT ABDOMEN AND PELVIS WITHOUT CONTRAST  TECHNIQUE: Multidetector CT imaging of the abdomen and pelvis was performed following the standard protocol without IV  contrast.  COMPARISON:  None.  FINDINGS: Lower chest: Minor basilar atelectasis. No lower lobe pneumonia. Normal heart size. No pericardial or pleural effusion. Extensive atherosclerosis of the lower thoracic and abdominal aorta. Mild ectasia of the lower thoracic aorta at the diaphragmatic hiatus measuring 3.8 x 3.7 cm, image 13.  Abdomen: Several small layering calcified gallstones noted. No biliary dilatation or gallbladder distention. No inflammatory process in the right upper quadrant.  Liver, biliary system, pancreas, spleen, and adrenal glands are within normal limits for age and noncontrast imaging.  Kidneys demonstrate no acute obstruction, hydronephrosis, or obstructing ureteral calculus on either side. Ureters are symmetric and decompressed. No dilated ureter or hydroureter. Left kidney demonstrates a small hypodense parapelvic cyst measuring 16 mm with negative Hounsfield units, image 28. No other renal abnormality. Extensive calcific atherosclerosis of the aorta and iliac vessels and tortuosity. No significant abdominal aneurysm.  Negative for bowel obstruction, dilatation, ileus, or free air. Scattered colonic diverticulosis, most pronounced on the right colon.  No abdominal free fluid, fluid collection, hemorrhage, abscess, or adenopathy.  Pelvis: Bilateral adnexal ovarian cysts noted. Right ovarian cyst measures 5.4 x 3.4 cm, image 60. There is a larger left ovarian cyst measuring 8.1 x 8.4 cm, image 61. These are concerning for abnormal ovarian cysts in a postmenopausal female. Ovarian cystic malignancy is not excluded. No associated ascites or peritoneal disease. Uterus is small and atrophic. Urinary bladder unremarkable. No acute distal bowel process.  Diffuse degenerative changes noted of the spine. Multilevel vacuum disc phenomena noted. Normal alignment.  IMPRESSION: Negative  for acute obstructing ureteral calculus, obstructive uropathy, or hydronephrosis.  Probable incidental left renal  parapelvic cyst measuring 16 mm.  Aortic calcific atherosclerosis and mild ectasia, maximal diameter 3.8 cm.  Cholelithiasis  Diffuse right colon diverticulosis  Bilateral ovarian cystic masses, largest on the left ovary measures 8.4 cm. These are concerning for a cystic ovarian malignancies in a postmenopausal female. No associated ascites or peritoneal disease. This warrants further gynecologic workup.   Electronically Signed   By: Daryll Brod M.D.   On: 07/18/2013 21:54   Dg Chest Port 1 View  07/25/2013   CLINICAL DATA:  Acute respiratory failure  EXAM: PORTABLE CHEST - 1 VIEW  COMPARISON:  07/23/2013  FINDINGS: Cardiac silhouette is normal in size and configuration. Normal mediastinal and hilar contours. Than diffusely and irregularly thickened interstitial markings are similar to the prior exam. Opacity at the left lung base obscures hemidiaphragm. This may be mildly increased.  Minimal effusions are suspected.  No pneumothorax.  Changes from right breast surgery are stable.  IMPRESSION: 1. Left basilar opacity which may reflect atelectasis or infiltrate. This appears mildly increased from the prior study although the difference may be technical only. Probable minimal effusions. 2. Irregularly thickened interstitial markings similar to the prior study. This suggests diffuse interstitial edema.   Electronically Signed   By: Lajean Manes M.D.   On: 07/25/2013 11:53   Dg Chest Port 1 View  07/23/2013   CLINICAL DATA:  hypoxia  EXAM: PORTABLE CHEST - 1 VIEW  COMPARISON:  DG CHEST 1V PORT dated 07/22/2013  FINDINGS: Low lung volumes. Cardiac silhouette is enlarged. Atherosclerotic calcifications within the aorta. Increased density left lung base and blunting of the left costophrenic angle. There is diffuse prominence of interstitial markings and areas of mild peribronchial cuffing.  IMPRESSION: Pulmonary edema  Atelectasis versus infiltrate left lung base and trace left effusion.   Electronically Signed    By: Margaree Mackintosh M.D.   On: 07/23/2013 10:06   Dg Chest Port 1 View  07/22/2013   CLINICAL DATA:  Hypoxia  EXAM: PORTABLE CHEST - 1 VIEW  COMPARISON:  July 21, 2013  FINDINGS: There is underlying emphysema. There is slightly less interstitial edema. There is patchy consolidation in the left base with small left effusion.  Heart is mildly enlarged. The pulmonary vascularity reflects pulmonary venous hypertension. There are surgical clips in the right axilla.  IMPRESSION: Congestive heart failure superimposed on underlying emphysematous change with slightly less edema compared to 1 day prior. Mild consolidation left base. No new opacity.   Electronically Signed   By: Lowella Grip M.D.   On: 07/22/2013 08:19   Dg Chest Port 1 View  07/21/2013   CLINICAL DATA:  Dyspnea  EXAM: PORTABLE CHEST - 1 VIEW  COMPARISON:  Prior chest x-ray 07/18/2013  FINDINGS: Stable mild cardiomegaly. Atherosclerotic calcifications noted in the transverse aorta. Interval development of diffuse bilateral interstitial opacities consistent with interstitial pulmonary edema. Dense left basilar opacity favored to reflect a combination of atelectasis developing alveolar edema. No pneumothorax. Small left effusion not excluded. Surgical clips again noted in the right axilla. No acute osseous abnormality.  IMPRESSION: 1. Interval development of mild CHF. 2. Left basilar opacity favored to reflect a combination of atelectasis and developing alveolar edema. Small pleural effusion not excluded.   Electronically Signed   By: Jacqulynn Cadet M.D.   On: 07/21/2013 08:19   Dg Abd Acute W/chest  07/18/2013   CLINICAL DATA:  pain  EXAM: ACUTE ABDOMEN  SERIES (ABDOMEN 2 VIEW & CHEST 1 VIEW)  COMPARISON:  DG CHEST 1V PORT dated 07/07/2012  FINDINGS: There is no evidence of dilated bowel loops or free intraperitoneal air. No radiopaque calculi or other significant radiographic abnormality is seen. Heart size and mediastinal contours are within  normal limits. The lungs are hyperinflated and there is flattening of the hemidiaphragms. Lungs otherwise clear. Postsurgical changes in the right axilla.  IMPRESSION: Negative abdominal radiographs. No acute cardiopulmonary disease COPD.   Electronically Signed   By: Margaree Mackintosh M.D.   On: 07/18/2013 16:45   Dg Foot Complete Left  07/18/2013   CLINICAL DATA:  Ulcerations.  EXAM: LEFT FOOT - COMPLETE 3+ VIEW  COMPARISON:  None.  FINDINGS: Diffuse severe osteopenia and degenerative change. No acute bony or joint abnormality. If osteomyelitis is of clinical concern MRI can be obtained.  IMPRESSION: Diffuse osteopenia and degenerative change, no acute bony abnormality identified.   Electronically Signed   By: Marcello Moores  Register   On: 07/18/2013 21:59   Dg Foot Complete Right  07/18/2013   CLINICAL DATA:  Toe ulceration  EXAM: RIGHT FOOT COMPLETE - 3+ VIEW  COMPARISON:  None.  FINDINGS: No acute fracture. No dislocation. Deformity of the phalanges of the small toe has a chronic appearance. Severe osteopenia. Nonspecific subtle erosion at the lateral head of the proximal phalanx of the great toe.  IMPRESSION: No acute bony pathology.  Chronic changes.   Electronically Signed   By: Maryclare Bean M.D.   On: 07/18/2013 21:59         Subjective: Overall, patient is feeling better. She still complains of shortness of breath. Denies any fevers, chills, chest pain, nausea, vomiting, diarrhea, abdominal pain. No headaches or dizziness.  Objective: Filed Vitals:   07/26/13 0730 07/26/13 0800 07/26/13 0948 07/26/13 1000  BP: 155/55   153/48  Pulse:    64  Temp:  98 F (36.7 C)    TempSrc:  Oral    Resp:    22  Height:      Weight:      SpO2:   94% 98%    Intake/Output Summary (Last 24 hours) at 07/26/13 1056 Last data filed at 07/26/13 0700  Gross per 24 hour  Intake    895 ml  Output      0 ml  Net    895 ml   Weight change: -0.9 kg (-1 lb 15.8 oz) Exam:   General:  Pt is alert, follows  commands appropriately, not in acute distress  HEENT: No icterus, No thrush,  [place]/AT  Cardiovascular: RRR, S1/S2, no rubs, no gallops  Respiratory: Bibasilar crackles, left greater than right. No wheezing. Movement.  Abdomen: Soft/+BS, non tender, non distended, no guarding  Extremities: No edema, No lymphangitis, No petechiae, No rashes, no synovitis  Data Reviewed: Basic Metabolic Panel:  Recent Labs Lab 07/22/13 0959 07/23/13 0310 07/24/13 0318 07/25/13 0308 07/26/13 0322  NA 136* 136* 139 137 136*  K 4.0 4.8 4.1 4.0 4.2  CL 97 98 100 100 99  CO2 25 25 26 27 27   GLUCOSE 147* 135* 139* 117* 102*  BUN 25* 34* 46* 51* 58*  CREATININE 1.30* 1.43* 1.67* 1.68* 1.82*  CALCIUM 9.3 9.1 8.9 9.1 8.8   Liver Function Tests: No results found for this basename: AST, ALT, ALKPHOS, BILITOT, PROT, ALBUMIN,  in the last 168 hours No results found for this basename: LIPASE, AMYLASE,  in the last 168 hours No results found for this  basename: AMMONIA,  in the last 168 hours CBC:  Recent Labs Lab 07/20/13 1315 07/22/13 0959 07/23/13 0310 07/24/13 0318 07/26/13 0322  WBC 11.1* 12.8* 13.3* 13.1* 16.6*  NEUTROABS  --   --   --   --  14.1*  HGB 12.4 12.9 12.1 12.3 12.7  HCT 37.0 38.6 37.1 37.6 37.7  MCV 94.9 93.0 95.1 95.2 93.3  PLT 212 238 236 267 296   Cardiac Enzymes: No results found for this basename: CKTOTAL, CKMB, CKMBINDEX, TROPONINI,  in the last 168 hours BNP: No components found with this basename: POCBNP,  CBG:  Recent Labs Lab 07/25/13 0612 07/25/13 1234 07/25/13 1635 07/25/13 2216 07/26/13 0756  GLUCAP 111* 113* 139* 103* 84    Recent Results (from the past 240 hour(s))  URINE CULTURE     Status: None   Collection Time    07/18/13  6:25 PM      Result Value Ref Range Status   Specimen Description URINE, CLEAN CATCH   Final   Special Requests Normal   Final   Culture  Setup Time     Final   Value: 07/18/2013 22:05     Performed at [place]     Final   Value: 8,000 COLONIES/ML     Performed at [facility]   Culture     Final   Value: INSIGNIFICANT GROWTH     Performed at [facility]   Report Status 07/20/2013 FINAL   Final  MRSA PCR SCREENING     Status: None   Collection Time    07/21/13  8:48 AM      Result Value Ref Range Status   MRSA by PCR NEGATIVE  NEGATIVE Final   Comment:            The GeneXpert MRSA Assay (FDA     approved for NASAL specimens     only), is one component of a     comprehensive MRSA colonization     surveillance program. It is not     intended to diagnose MRSA     infection nor to guide or     monitor treatment for     MRSA infections.     Scheduled Meds: . antiseptic oral rinse  15 mL Mouth Rinse q12n4p  . aspirin  81 mg Oral Daily  . atorvastatin  10 mg Oral q1800  . buPROPion  300 mg Oral Daily  . chlorhexidine  15 mL Mouth Rinse BID  . cloNIDine  0.1 mg Transdermal Weekly  . enoxaparin (LOVENOX) injection  30 mg Subcutaneous Q24H  . folic acid  1 mg Oral Daily  . hydrALAZINE  50 mg Oral 4 times per day  . ipratropium-albuterol  3 mL Nebulization Q4H  . isosorbide mononitrate  60 mg Oral Daily  . levofloxacin  750 mg Oral Q48H  . levothyroxine  50 mcg Oral QAC breakfast  . lovastatin  40 mg Oral QHS  . metoprolol tartrate  12.5 mg Oral BID  . metroNIDAZOLE  500 mg Oral 3 times per day  . multivitamin with minerals  1 tablet Oral Daily  . predniSONE  40 mg Oral Q breakfast  . thiamine  100 mg Oral Daily   Or  . thiamine  100 mg Intravenous Daily  . verapamil  240 mg Oral BID   Continuous Infusions: . nitroGLYCERIN Stopped (07/26/13 0400)     Orson Eva, DO  [facility]  Pager [phone]  If 7PM-7AM, please contact night-coverage [email or web address] Password [email or web address] 07/26/2013, 10:56 AM   LOS: 8 days

## 2013-07-27 DIAGNOSIS — I119 Hypertensive heart disease without heart failure: Secondary | ICD-10-CM

## 2013-07-27 LAB — GLUCOSE, CAPILLARY
GLUCOSE-CAPILLARY: 114 mg/dL — AB (ref 70–99)
GLUCOSE-CAPILLARY: 139 mg/dL — AB (ref 70–99)
Glucose-Capillary: 78 mg/dL (ref 70–99)
Glucose-Capillary: 100 mg/dL — ABNORMAL HIGH (ref 70–99)

## 2013-07-27 LAB — BASIC METABOLIC PANEL
BUN: 57 mg/dL — ABNORMAL HIGH (ref 6–23)
CO2: 28 mEq/L (ref 19–32)
CREATININE: 1.73 mg/dL — AB (ref 0.50–1.10)
Calcium: 8.5 mg/dL (ref 8.4–10.5)
Chloride: 102 mEq/L (ref 96–112)
GFR, EST AFRICAN AMERICAN: 31 mL/min — AB (ref >90)
GFR, EST NON AFRICAN AMERICAN: 26 mL/min — AB (ref >90)
Glucose, Bld: 121 mg/dL — ABNORMAL HIGH (ref 70–99)
Potassium: 4.3 mEq/L (ref 3.7–5.3)
Sodium: 141 mEq/L (ref 137–147)

## 2013-07-27 LAB — HEMOGLOBIN A1C
Hgb A1c MFr Bld: 5.4 % (ref <5.7)
Mean Plasma Glucose: 108 mg/dL (ref <117)

## 2013-07-27 MED ORDER — GUAIFENESIN ER 600 MG PO TB12
600.0000 mg | ORAL_TABLET | Freq: Two times a day (BID) | ORAL | Status: DC
  Filled 2013-07-27: qty 1

## 2013-07-27 MED ORDER — PREDNISONE 20 MG PO TABS
30.0000 mg | ORAL_TABLET | Freq: Every day | ORAL | Status: DC
  Administered 2013-07-28: 30 mg via ORAL
  Filled 2013-07-27 (×2): qty 1

## 2013-07-27 MED ORDER — SALINE SPRAY 0.65 % NA SOLN
1.0000 | NASAL | Status: DC | PRN
  Administered 2013-07-27: 1 via NASAL
  Filled 2013-07-27: qty 44

## 2013-07-27 NOTE — Evaluation (Signed)
Occupational Therapy Evaluation Patient Details Name: Renee Donovan MRN: [id number] DOB: March 25, 1931 Today's Date: 07/27/2013    History of Present Illness Pt is an 80 year old female admitted 4/23 for nausea and vomiting with history of COPD, hypothyroidism, chronic alcoholism, tobacco abuse, history of breast cancer, hypertension.  Pt also with toe ulcers suspicious for PVD per chart review.     Clinical Impression   This 80 year old female was admitted for the above.  She is normally mod I at baseline, and is deconditioned at this time.  She needs up to mod A for LB adls.  Goals in acute are for supervision to min A levels.      Follow Up Recommendations  SNF    Equipment Recommendations   (to be further assessed, ? 3:1)    Recommendations for Other Services       Precautions / Restrictions Precautions Precautions: Fall Precaution Comments: chronic O2, check sats Restrictions Weight Bearing Restrictions: No      Mobility Bed Mobility         Supine to sit: Mod assist Sit to supine: Min assist   General bed mobility comments: assist for legs and trunk to sit up; pt managed trunk back to bed  Transfers                 General transfer comment: partially stood with min A--got cramp and returned to sitting then lying    Balance                                            ADL Overall ADL's : Needs assistance/impaired     Grooming: Brushing hair;Set up;Sitting   Upper Body Bathing: Minimal assitance;Sitting   Lower Body Bathing: Moderate assistance;Sit to/from stand   Upper Body Dressing : Minimal assistance;Sitting   Lower Body Dressing: Moderate assistance;Sit to/from stand                 General ADL Comments: pt agreeable to sitting EOB:  did not want to get up to chair or sit on commode.  Pt stood partially with min A but got cramp in inner thigh and sat.  Did not want to try again.  Fatiques easily     Lobbyist      Pertinent Vitals/Pain sats 90% on 2 liters 02     Hand Dominance     Extremity/Trunk Assessment Upper Extremity Assessment Upper Extremity Assessment: Overall WFL for tasks assessed           Communication Communication Communication: No difficulties   Cognition Arousal/Alertness: Awake/alert Behavior During Therapy: WFL for tasks assessed/performed Overall Cognitive Status: Within Functional Limits for tasks assessed                     General Comments       Exercises       Shoulder Instructions      Home Living Family/patient expects to be discharged to:: Unsure                                 Additional Comments: pt lived alone pta  Prior Functioning/Environment Level of Independence: Independent with assistive device(s)             OT Diagnosis: Generalized weakness   OT Problem List: Decreased strength;Decreased activity tolerance;Decreased knowledge of use of DME or AE;Cardiopulmonary status limiting activity (standing balance, NT)   OT Treatment/Interventions: Self-care/ADL training;Energy conservation;DME and/or AE instruction;Patient/family education;Therapeutic activities    OT Goals(Current goals can be found in the care plan section) Acute Rehab OT Goals Patient Stated Goal: get strength back OT Goal Formulation: With patient Time For Goal Achievement: 08/10/13 Potential to Achieve Goals: Good ADL Goals Pt Will Perform Grooming: with supervision;standing Pt Will Perform Lower Body Bathing: with supervision;with adaptive equipment;sit to/from stand Pt Will Perform Lower Body Dressing: with min assist;with adaptive equipment;sit to/from stand Pt Will Transfer to Toilet: with min guard assist;ambulating;bedside commode Additional ADL Goal #1: pt will complete UB adls with set up sitting Additional ADL Goal #2: pt will initiate at least one rest break for adls.   OT Frequency: Min 2X/week   Barriers to D/C:            Co-evaluation              End of Session    Activity Tolerance: Patient limited by fatigue Patient left: in bed;with call bell/phone within reach;with bed alarm set   Time: 1353-1407 OT Time Calculation (min): 14 min Charges:  OT General Charges $OT Visit: 1 Procedure OT Evaluation $Initial OT Evaluation Tier I: 1 Procedure OT Treatments $Therapeutic Activity: 8-22 mins G-Codes:    Renee Donovan 24-Aug-2013, 2:24 PM  Renee Donovan, OTR/L [phone] Aug 24, 2013 Renee Donovan, OTR/L [phone] 24-Aug-2013

## 2013-07-27 NOTE — Progress Notes (Signed)
Patient Name: Renee Donovan Date of Encounter: 07/27/2013  Principal Problem:   Nausea and vomiting Active Problems:   Hypothyroidism   COPD (chronic obstructive pulmonary disease) with emphysema   Toe ulcer   Accelerated hypertension   Nausea & vomiting   Acute and chronic respiratory failure   ARF (acute renal failure)   Acute diastolic CHF (congestive heart failure)   COPD exacerbation   Length of Stay: 9  SUBJECTIVE  Sleeping comfortably   CURRENT MEDS . antiseptic oral rinse  15 mL Mouth Rinse q12n4p  . aspirin  81 mg Oral Daily  . atorvastatin  10 mg Oral q1800  . buPROPion  300 mg Oral Daily  . chlorhexidine  15 mL Mouth Rinse BID  . cloNIDine  0.1 mg Transdermal Weekly  . enoxaparin (LOVENOX) injection  30 mg Subcutaneous Q24H  . folic acid  1 mg Oral Daily  . hydrALAZINE  75 mg Oral 3 times per day  . ipratropium-albuterol  3 mL Nebulization Q4H  . isosorbide mononitrate  60 mg Oral Daily  . levofloxacin  750 mg Oral Q48H  . levothyroxine  50 mcg Oral QAC breakfast  . lovastatin  40 mg Oral QHS  . metoprolol tartrate  12.5 mg Oral BID  . metroNIDAZOLE  500 mg Oral 3 times per day  . multivitamin with minerals  1 tablet Oral Daily  . predniSONE  40 mg Oral Q breakfast  . thiamine  100 mg Oral Daily   Or  . thiamine  100 mg Intravenous Daily  . verapamil  240 mg Oral BID    OBJECTIVE   Intake/Output Summary (Last 24 hours) at 07/27/13 1012 Last data filed at 07/27/13 0500  Gross per 24 hour  Intake      0 ml  Output      0 ml  Net      0 ml   Filed Weights   07/25/13 0400 07/26/13 0600 07/26/13 1438  Weight: 178 lb 12.7 oz (81.1 kg) 176 lb 12.9 oz (80.2 kg) 180 lb 12.4 oz (82 kg)    PHYSICAL EXAM Filed Vitals:   07/26/13 2348 07/27/13 0328 07/27/13 0604 07/27/13 0801  BP:   163/78   Pulse:   57   Temp:   97.5 F (36.4 C)   TempSrc:   Oral   Resp:   16   Height:      Weight:      SpO2: 91% 94% 94% 94%   General: Alert, oriented x3,  no distress Head: no evidence of trauma, PERRL, EOMI, no exophtalmos or lid lag, no myxedema, no xanthelasma; normal ears, nose and oropharynx Neck: normal jugular venous pulsations and no hepatojugular reflux; brisk carotid pulses without delay and no carotid bruits Chest: clear to auscultation, no signs of consolidation by percussion or palpation, normal fremitus, symmetrical and full respiratory excursions Cardiovascular: normal position and quality of the apical impulse, regular rhythm, normal first and second heart sounds, no rubs or gallops, no murmur Abdomen: no tenderness or distention, no masses by palpation, no abnormal pulsatility or arterial bruits, normal bowel sounds, no hepatosplenomegaly Extremities: no clubbing, cyanosis or edema; 2+ radial, ulnar and brachial pulses bilaterally; 2+ right femoral, posterior tibial and dorsalis pedis pulses; 2+ left femoral, posterior tibial and dorsalis pedis pulses; no subclavian or femoral bruits Neurological: grossly nonfocal  LABS  CBC  Recent Labs  07/26/13 0322  WBC 16.6*  NEUTROABS 14.1*  HGB 12.7  HCT 37.7  MCV 93.3  PLT 809   Basic Metabolic Panel  Recent Labs  07/26/13 0322 07/27/13 0526  NA 136* 141  K 4.2 4.3  CL 99 102  CO2 27 28  GLUCOSE 102* 121*  BUN 58* 57*  CREATININE 1.82* 1.73*  CALCIUM 8.8 8.5    Radiology Studies Imaging results have been reviewed and Dg Chest Port 1 View  07/25/2013   CLINICAL DATA:  Acute respiratory failure  EXAM: PORTABLE CHEST - 1 VIEW  COMPARISON:  07/23/2013  FINDINGS: Cardiac silhouette is normal in size and configuration. Normal mediastinal and hilar contours. Than diffusely and irregularly thickened interstitial markings are similar to the prior exam. Opacity at the left lung base obscures hemidiaphragm. This may be mildly increased.  Minimal effusions are suspected.  No pneumothorax.  Changes from right breast surgery are stable.  IMPRESSION: 1. Left basilar opacity which may  reflect atelectasis or infiltrate. This appears mildly increased from the prior study although the difference may be technical only. Probable minimal effusions. 2. Irregularly thickened interstitial markings similar to the prior study. This suggests diffuse interstitial edema.   Electronically Signed   By: Lajean Manes M.D.   On: 07/25/2013 11:53    ASSESSMENT AND PLAN Slight improvement in creatinine. No overt hypervolemia. Echo and BNP suggests diastolic dysfunction and increased left atrial pressure. CXR equivocal. She appears to have diastolic dysfunction due to hypertensive heart disease. Prior to current decompensation, only diuretic was a small dose of HCTZ in her combo BP med. May have to deal with extra sodium retention while on steroids, but if cautious with diet, probably better not to prescribe a loop diuretic chronically, to avoid renal dysfunction. Would be beneficial to restart ACEi after renal function returns to baseline (normal). SBP is slightly high, but DBP often in the 40s, probably due to noncompliant arterial system. Multiple changes have been made to her medications for BP and she will require frequent follow up after DC to fine tune them.   Sanda Klein, MD, [facility] [phone] office [phone] pager 07/27/2013 10:12 AM

## 2013-07-27 NOTE — Progress Notes (Signed)
PROGRESS NOTE  Renee Donovan [id number] DOB: Apr 04, 1930 DOA: 07/18/2013 PCP: [name], MD  Assessment/Plan: Acute on Chronic respiratory failure-  -COPD exacerbation and fluid overload  - 07/23/2013 CXR continues to be read as "mild edema" - possibly chronic fibrosis - - received 3 doses of Lasix IV and BUN/ Cr increasing  - ECHO showing Gr 2 diastolic dysfunction, EF 123456  - 07/23/2013 ABG 7.39/46/59/27 on 30% FiO2  - continue prednisone-->wean to 30mg  daily  -goal SaO2 >92%--presently stable on 2L--94% saturation  -finish 7 days of levofloxacin and flagyl for COPD exacerbation and concerns for aspiration pneumonitis Acute diastolic CHF  -EF 123456, grade 2 diastolic dysfunction  -proBNP 4353-->?partly due to renal failure  -gained 5 kg since day of admission but question accuracy of weights from different units -Daily weights  -appreciate cardiology input--continue to hold lasix Nausea and vomiting- possibly gastroenteritis?  - resolved  - advanced to cardiac diet  Pyuria  - culture negative  - d/c Rocephin  ETOH abuse  - CIWA scale ordered  -Drinks at least 16 ounces wine daily  Hypothyroidism  - cont levothyroxine  Toe ulcer/PVD  - added ASA and statin  - ABIs quite low - outpt vascular surgery eval  - advised to stop smoking  Accelerated hypertension  - cont Clonidine patch and Verapamil  -Added metoprolol tartrate and Imdur lower to wean nitroglycerin drip  -wean NTG drip for SBP <160-->weaned off 07/25/2013  -Adjust hydralazine to 75 mg every 8 hours for  Acute on Chronic Renal Failure (CKD stage 3)  - Cr worse due to Lasix   -am BMP  -03/26/12 renal arterial duplex negative for RAS    Family Communication: Pt at beside  Disposition Plan: SNF on 07/29/13  Antibiotics:  Levofloxacin 07/23/13>>>  Flagyl 07/23/13>>>           Procedures/Studies: Ct Abdomen Pelvis Wo Contrast  07/18/2013   CLINICAL DATA:  Mid abdominal pain,  flank pain  EXAM: CT ABDOMEN AND PELVIS WITHOUT CONTRAST  TECHNIQUE: Multidetector CT imaging of the abdomen and pelvis was performed following the standard protocol without IV contrast.  COMPARISON:  None.  FINDINGS: Lower chest: Minor basilar atelectasis. No lower lobe pneumonia. Normal heart size. No pericardial or pleural effusion. Extensive atherosclerosis of the lower thoracic and abdominal aorta. Mild ectasia of the lower thoracic aorta at the diaphragmatic hiatus measuring 3.8 x 3.7 cm, image 13.  Abdomen: Several small layering calcified gallstones noted. No biliary dilatation or gallbladder distention. No inflammatory process in the right upper quadrant.  Liver, biliary system, pancreas, spleen, and adrenal glands are within normal limits for age and noncontrast imaging.  Kidneys demonstrate no acute obstruction, hydronephrosis, or obstructing ureteral calculus on either side. Ureters are symmetric and decompressed. No dilated ureter or hydroureter. Left kidney demonstrates a small hypodense parapelvic cyst measuring 16 mm with negative Hounsfield units, image 28. No other renal abnormality. Extensive calcific atherosclerosis of the aorta and iliac vessels and tortuosity. No significant abdominal aneurysm.  Negative for bowel obstruction, dilatation, ileus, or free air. Scattered colonic diverticulosis, most pronounced on the right colon.  No abdominal free fluid, fluid collection, hemorrhage, abscess, or adenopathy.  Pelvis: Bilateral adnexal ovarian cysts noted. Right ovarian cyst measures 5.4 x 3.4 cm, image 60. There is a larger left ovarian cyst measuring 8.1 x 8.4 cm, image 61. These are concerning for abnormal ovarian cysts in a postmenopausal female. Ovarian cystic malignancy is not excluded.  No associated ascites or peritoneal disease. Uterus is small and atrophic. Urinary bladder unremarkable. No acute distal bowel process.  Diffuse degenerative changes noted of the spine. Multilevel vacuum disc  phenomena noted. Normal alignment.  IMPRESSION: Negative for acute obstructing ureteral calculus, obstructive uropathy, or hydronephrosis.  Probable incidental left renal parapelvic cyst measuring 16 mm.  Aortic calcific atherosclerosis and mild ectasia, maximal diameter 3.8 cm.  Cholelithiasis  Diffuse right colon diverticulosis  Bilateral ovarian cystic masses, largest on the left ovary measures 8.4 cm. These are concerning for a cystic ovarian malignancies in a postmenopausal female. No associated ascites or peritoneal disease. This warrants further gynecologic workup.   Electronically Signed   By: Daryll Brod M.D.   On: 07/18/2013 21:54   Dg Chest Port 1 View  07/25/2013   CLINICAL DATA:  Acute respiratory failure  EXAM: PORTABLE CHEST - 1 VIEW  COMPARISON:  07/23/2013  FINDINGS: Cardiac silhouette is normal in size and configuration. Normal mediastinal and hilar contours. Than diffusely and irregularly thickened interstitial markings are similar to the prior exam. Opacity at the left lung base obscures hemidiaphragm. This may be mildly increased.  Minimal effusions are suspected.  No pneumothorax.  Changes from right breast surgery are stable.  IMPRESSION: 1. Left basilar opacity which may reflect atelectasis or infiltrate. This appears mildly increased from the prior study although the difference may be technical only. Probable minimal effusions. 2. Irregularly thickened interstitial markings similar to the prior study. This suggests diffuse interstitial edema.   Electronically Signed   By: Lajean Manes M.D.   On: 07/25/2013 11:53   Dg Chest Port 1 View  07/23/2013   CLINICAL DATA:  hypoxia  EXAM: PORTABLE CHEST - 1 VIEW  COMPARISON:  DG CHEST 1V PORT dated 07/22/2013  FINDINGS: Low lung volumes. Cardiac silhouette is enlarged. Atherosclerotic calcifications within the aorta. Increased density left lung base and blunting of the left costophrenic angle. There is diffuse prominence of interstitial  markings and areas of mild peribronchial cuffing.  IMPRESSION: Pulmonary edema  Atelectasis versus infiltrate left lung base and trace left effusion.   Electronically Signed   By: Margaree Mackintosh M.D.   On: 07/23/2013 10:06   Dg Chest Port 1 View  07/22/2013   CLINICAL DATA:  Hypoxia  EXAM: PORTABLE CHEST - 1 VIEW  COMPARISON:  July 21, 2013  FINDINGS: There is underlying emphysema. There is slightly less interstitial edema. There is patchy consolidation in the left base with small left effusion.  Heart is mildly enlarged. The pulmonary vascularity reflects pulmonary venous hypertension. There are surgical clips in the right axilla.  IMPRESSION: Congestive heart failure superimposed on underlying emphysematous change with slightly less edema compared to 1 day prior. Mild consolidation left base. No new opacity.   Electronically Signed   By: Lowella Grip M.D.   On: 07/22/2013 08:19   Dg Chest Port 1 View  07/21/2013   CLINICAL DATA:  Dyspnea  EXAM: PORTABLE CHEST - 1 VIEW  COMPARISON:  Prior chest x-ray 07/18/2013  FINDINGS: Stable mild cardiomegaly. Atherosclerotic calcifications noted in the transverse aorta. Interval development of diffuse bilateral interstitial opacities consistent with interstitial pulmonary edema. Dense left basilar opacity favored to reflect a combination of atelectasis developing alveolar edema. No pneumothorax. Small left effusion not excluded. Surgical clips again noted in the right axilla. No acute osseous abnormality.  IMPRESSION: 1. Interval development of mild CHF. 2. Left basilar opacity favored to reflect a combination of atelectasis and developing alveolar edema. Small pleural  effusion not excluded.   Electronically Signed   By: Jacqulynn Cadet M.D.   On: 07/21/2013 08:19   Dg Abd Acute W/chest  07/18/2013   CLINICAL DATA:  pain  EXAM: ACUTE ABDOMEN SERIES (ABDOMEN 2 VIEW & CHEST 1 VIEW)  COMPARISON:  DG CHEST 1V PORT dated 07/07/2012  FINDINGS: There is no evidence of  dilated bowel loops or free intraperitoneal air. No radiopaque calculi or other significant radiographic abnormality is seen. Heart size and mediastinal contours are within normal limits. The lungs are hyperinflated and there is flattening of the hemidiaphragms. Lungs otherwise clear. Postsurgical changes in the right axilla.  IMPRESSION: Negative abdominal radiographs. No acute cardiopulmonary disease COPD.   Electronically Signed   By: Margaree Mackintosh M.D.   On: 07/18/2013 16:45   Dg Foot Complete Left  07/18/2013   CLINICAL DATA:  Ulcerations.  EXAM: LEFT FOOT - COMPLETE 3+ VIEW  COMPARISON:  None.  FINDINGS: Diffuse severe osteopenia and degenerative change. No acute bony or joint abnormality. If osteomyelitis is of clinical concern MRI can be obtained.  IMPRESSION: Diffuse osteopenia and degenerative change, no acute bony abnormality identified.   Electronically Signed   By: Marcello Moores  Register   On: 07/18/2013 21:59   Dg Foot Complete Right  07/18/2013   CLINICAL DATA:  Toe ulceration  EXAM: RIGHT FOOT COMPLETE - 3+ VIEW  COMPARISON:  None.  FINDINGS: No acute fracture. No dislocation. Deformity of the phalanges of the small toe has a chronic appearance. Severe osteopenia. Nonspecific subtle erosion at the lateral head of the proximal phalanx of the great toe.  IMPRESSION: No acute bony pathology.  Chronic changes.   Electronically Signed   By: Maryclare Bean M.D.   On: 07/18/2013 21:59         Subjective: Patient is in better spirits today. She is breathing better today. Denies fevers, chills, chest pain, nausea, vomiting, diarrhea, abdominal pain. She complains of some constipation. No headache or dizziness.  Objective: Filed Vitals:   07/27/13 0801 07/27/13 1154 07/27/13 1500 07/27/13 1610  BP:   140/61   Pulse:   56   Temp:   97.7 F (36.5 C)   TempSrc:   Oral   Resp:   16   Height:      Weight:      SpO2: 94% 90% 93% 93%    Intake/Output Summary (Last 24 hours) at 07/27/13 1829 Last  data filed at 07/27/13 1500  Gross per 24 hour  Intake    220 ml  Output      0 ml  Net    220 ml   Weight change: 1.8 kg (3 lb 15.5 oz) Exam:   General:  Pt is alert, follows commands appropriately, not in acute distress  HEENT: No icterus, No thrush,  [place]/AT  Cardiovascular: RRR, S1/S2, no rubs, no gallops  Respiratory: Bibasilar crackles. No wheezing. Good air movement.  Abdomen: Soft/+BS, non tender, non distended, no guarding  Extremities: No edema, No lymphangitis, No petechiae, No rashes, no synovitis  Data Reviewed: Basic Metabolic Panel:  Recent Labs Lab 07/23/13 0310 07/24/13 0318 07/25/13 0308 07/26/13 0322 07/27/13 0526  NA 136* 139 137 136* 141  K 4.8 4.1 4.0 4.2 4.3  CL 98 100 100 99 102  CO2 25 26 27 27 28   GLUCOSE 135* 139* 117* 102* 121*  BUN 34* 46* 51* 58* 57*  CREATININE 1.43* 1.67* 1.68* 1.82* 1.73*  CALCIUM 9.1 8.9 9.1 8.8 8.5   Liver Function  Tests: No results found for this basename: AST, ALT, ALKPHOS, BILITOT, PROT, ALBUMIN,  in the last 168 hours No results found for this basename: LIPASE, AMYLASE,  in the last 168 hours No results found for this basename: AMMONIA,  in the last 168 hours CBC:  Recent Labs Lab 07/22/13 0959 07/23/13 0310 07/24/13 0318 07/26/13 0322  WBC 12.8* 13.3* 13.1* 16.6*  NEUTROABS  --   --   --  14.1*  HGB 12.9 12.1 12.3 12.7  HCT 38.6 37.1 37.6 37.7  MCV 93.0 95.1 95.2 93.3  PLT 238 236 267 296   Cardiac Enzymes: No results found for this basename: CKTOTAL, CKMB, CKMBINDEX, TROPONINI,  in the last 168 hours BNP: No components found with this basename: POCBNP,  CBG:  Recent Labs Lab 07/26/13 1800 07/26/13 2120 07/27/13 0756 07/27/13 1148 07/27/13 1659  GLUCAP 132* 114* 78 100* 114*    Recent Results (from the past 240 hour(s))  URINE CULTURE     Status: None   Collection Time    07/18/13  6:25 PM      Result Value Ref Range Status   Specimen Description URINE, CLEAN CATCH   Final    Special Requests Normal   Final   Culture  Setup Time     Final   Value: 07/18/2013 22:05     Performed at [facility]     Final   Value: 8,000 COLONIES/ML     Performed at [facility]   Culture     Final   Value: INSIGNIFICANT GROWTH     Performed at [facility]   Report Status 07/20/2013 FINAL   Final  MRSA PCR SCREENING     Status: None   Collection Time    07/21/13  8:48 AM      Result Value Ref Range Status   MRSA by PCR NEGATIVE  NEGATIVE Final   Comment:            The GeneXpert MRSA Assay (FDA     approved for NASAL specimens     only), is one component of a     comprehensive MRSA colonization     surveillance program. It is not     intended to diagnose MRSA     infection nor to guide or     monitor treatment for     MRSA infections.     Scheduled Meds: . antiseptic oral rinse  15 mL Mouth Rinse q12n4p  . aspirin  81 mg Oral Daily  . atorvastatin  10 mg Oral q1800  . buPROPion  300 mg Oral Daily  . chlorhexidine  15 mL Mouth Rinse BID  . cloNIDine  0.1 mg Transdermal Weekly  . enoxaparin (LOVENOX) injection  30 mg Subcutaneous Q24H  . folic acid  1 mg Oral Daily  . hydrALAZINE  75 mg Oral 3 times per day  . ipratropium-albuterol  3 mL Nebulization Q4H  . isosorbide mononitrate  60 mg Oral Daily  . levofloxacin  750 mg Oral Q48H  . levothyroxine  50 mcg Oral QAC breakfast  . lovastatin  40 mg Oral QHS  . metoprolol tartrate  12.5 mg Oral BID  . metroNIDAZOLE  500 mg Oral 3 times per day  . multivitamin with minerals  1 tablet Oral Daily  . [START ON 07/28/2013] predniSONE  30 mg Oral Q breakfast  . thiamine  100 mg Oral Daily   Or  . thiamine  100  mg Intravenous Daily  . verapamil  240 mg Oral BID   Continuous Infusions: . nitroGLYCERIN Stopped (07/26/13 0400)     Orson Eva, DO  [facility] Pager [phone]  If 7PM-7AM, please contact night-coverage [email or web address] Password [email or web address] 07/27/2013, 6:29 PM    LOS: 9 days

## 2013-07-28 DIAGNOSIS — R5381 Other malaise: Secondary | ICD-10-CM

## 2013-07-28 LAB — GLUCOSE, CAPILLARY: GLUCOSE-CAPILLARY: 88 mg/dL (ref 70–99)

## 2013-07-28 LAB — BASIC METABOLIC PANEL
BUN: 51 mg/dL — ABNORMAL HIGH (ref 6–23)
CALCIUM: 8.5 mg/dL (ref 8.4–10.5)
CHLORIDE: 102 meq/L (ref 96–112)
CO2: 27 meq/L (ref 19–32)
Creatinine, Ser: 1.63 mg/dL — ABNORMAL HIGH (ref 0.50–1.10)
GFR calc Af Amer: 33 mL/min — ABNORMAL LOW (ref >90)
GFR calc non Af Amer: 28 mL/min — ABNORMAL LOW (ref >90)
Glucose, Bld: 107 mg/dL — ABNORMAL HIGH (ref 70–99)
POTASSIUM: 3.8 meq/L (ref 3.7–5.3)
SODIUM: 139 meq/L (ref 137–147)

## 2013-07-28 MED ORDER — HYDRALAZINE HCL 50 MG PO TABS
100.0000 mg | ORAL_TABLET | Freq: Three times a day (TID) | ORAL | Status: DC
  Administered 2013-07-28 – 2013-07-29 (×4): 100 mg via ORAL
  Filled 2013-07-28 (×6): qty 2

## 2013-07-28 MED ORDER — IPRATROPIUM-ALBUTEROL 0.5-2.5 (3) MG/3ML IN SOLN
3.0000 mL | Freq: Four times a day (QID) | RESPIRATORY_TRACT | Status: DC
  Administered 2013-07-29: 3 mL via RESPIRATORY_TRACT
  Filled 2013-07-28 (×2): qty 3

## 2013-07-28 MED ORDER — PREDNISONE 20 MG PO TABS
20.0000 mg | ORAL_TABLET | Freq: Every day | ORAL | Status: DC
  Administered 2013-07-29: 20 mg via ORAL
  Filled 2013-07-28 (×2): qty 1

## 2013-07-28 MED ORDER — ISOSORBIDE MONONITRATE ER 60 MG PO TB24
120.0000 mg | ORAL_TABLET | Freq: Every day | ORAL | Status: DC
  Administered 2013-07-29: 120 mg via ORAL
  Filled 2013-07-28: qty 2

## 2013-07-28 MED ORDER — SENNA 8.6 MG PO TABS
2.0000 | ORAL_TABLET | Freq: Every day | ORAL | Status: DC
  Administered 2013-07-28 – 2013-07-29 (×2): 17.2 mg via ORAL
  Filled 2013-07-28 (×2): qty 2

## 2013-07-28 MED ORDER — DOCUSATE SODIUM 100 MG PO CAPS
100.0000 mg | ORAL_CAPSULE | Freq: Two times a day (BID) | ORAL | Status: DC
  Administered 2013-07-28 – 2013-07-29 (×2): 100 mg via ORAL
  Filled 2013-07-28 (×3): qty 1

## 2013-07-28 NOTE — Progress Notes (Signed)
Patient Name: Renee Donovan Date of Encounter: 07/28/2013  Principal Problem:   Nausea and vomiting Active Problems:   Hypothyroidism   COPD (chronic obstructive pulmonary disease) with emphysema   Toe ulcer   Accelerated hypertension   Nausea & vomiting   Acute and chronic respiratory failure   ARF (acute renal failure)   Acute diastolic CHF (congestive heart failure)   COPD exacerbation   Length of Stay: 10  SUBJECTIVE  Feels better. Breathing pretty much at baseline. SBP consistently high. Creatinine slowly improving.  CURRENT MEDS . antiseptic oral rinse  15 mL Mouth Rinse q12n4p  . aspirin  81 mg Oral Daily  . atorvastatin  10 mg Oral q1800  . buPROPion  300 mg Oral Daily  . chlorhexidine  15 mL Mouth Rinse BID  . cloNIDine  0.1 mg Transdermal Weekly  . enoxaparin (LOVENOX) injection  30 mg Subcutaneous Q24H  . folic acid  1 mg Oral Daily  . hydrALAZINE  100 mg Oral 3 times per day  . ipratropium-albuterol  3 mL Nebulization Q4H  . [START ON 07/29/2013] isosorbide mononitrate  120 mg Oral Daily  . levofloxacin  750 mg Oral Q48H  . levothyroxine  50 mcg Oral QAC breakfast  . lovastatin  40 mg Oral QHS  . metoprolol tartrate  12.5 mg Oral BID  . metroNIDAZOLE  500 mg Oral 3 times per day  . multivitamin with minerals  1 tablet Oral Daily  . predniSONE  30 mg Oral Q breakfast  . thiamine  100 mg Oral Daily   Or  . thiamine  100 mg Intravenous Daily  . verapamil  240 mg Oral BID    OBJECTIVE   Intake/Output Summary (Last 24 hours) at 07/28/13 1017 Last data filed at 07/27/13 1500  Gross per 24 hour  Intake    100 ml  Output      0 ml  Net    100 ml   Filed Weights   07/26/13 0600 07/26/13 1438 07/28/13 0633  Weight: 176 lb 12.9 oz (80.2 kg) 180 lb 12.4 oz (82 kg) 176 lb 12.9 oz (80.2 kg)    PHYSICAL EXAM Filed Vitals:   07/27/13 2351 07/28/13 0450 07/28/13 0633 07/28/13 0929  BP:  173/63    Pulse:  57    Temp:  98.4 F (36.9 C)    TempSrc:   Oral    Resp:  20    Height:      Weight:   176 lb 12.9 oz (80.2 kg)   SpO2: 92% 96%  95%   General: Alert, oriented x3, no distress  Head: no evidence of trauma, PERRL, EOMI, no exophtalmos or lid lag, no myxedema, no xanthelasma; normal ears, nose and oropharynx  Neck: normal jugular venous pulsations and no hepatojugular reflux; brisk carotid pulses without delay and no carotid bruits  Chest: dry crackles both bases, no signs of consolidation by percussion or palpation, normal fremitus, symmetrical and full respiratory excursions  Cardiovascular: normal position and quality of the apical impulse, regular rhythm, normal first and second heart sounds, no rubs or gallops, no murmur  Abdomen: no tenderness or distention, no masses by palpation, no abnormal pulsatility or arterial bruits, normal bowel sounds, no hepatosplenomegaly  Extremities: no clubbing, cyanosis or edema; 2+ radial, ulnar and brachial pulses bilaterally; 2+ right femoral, posterior tibial and dorsalis pedis pulses; 2+ left femoral, posterior tibial and dorsalis pedis pulses; no subclavian or femoral bruits  Neurological: grossly nonfocal   LABS  CBC  Recent Labs  07/26/13 0322  WBC 16.6*  NEUTROABS 14.1*  HGB 12.7  HCT 37.7  MCV 93.3  PLT 357   Basic Metabolic Panel  Recent Labs  07/27/13 0526 07/28/13 0455  NA 141 139  K 4.3 3.8  CL 102 102  CO2 28 27  GLUCOSE 121* 107*  BUN 57* 51*  CREATININE 1.73* 1.63*  CALCIUM 8.5 8.5    Radiology Studies No results found.   ASSESSMENT AND PLAN Continued improvement in creatinine.  No overt hypervolemia.  Echo and BNP suggests diastolic dysfunction and increased left atrial pressure. CXR equivocal. She appears to have diastolic dysfunction due to hypertensive heart disease.  SBP higher and DBP has not dipped to the 40s in last 24h. Increase hydralazine. Hold off diuretics. Resume ACEi once renal function back to baseline. Multiple changes have been made  to her medications for BP and she will require frequent follow up after DC to fine tune them.    Sanda Klein, MD, [facility] [phone] office [phone] pager 07/28/2013 10:17 AM

## 2013-07-28 NOTE — Progress Notes (Signed)
PROGRESS NOTE  ZYIAH WITHINGTON XLK:[id number] DOB: August 27, 1930 DOA: 07/18/2013 PCP: [name], MD  Assessment/Plan: Acute on Chronic respiratory failure-  -COPD exacerbation and fluid overload  - 07/23/2013 CXR continues to be read as "mild edema" - possibly chronic fibrosis - - received 3 doses of Lasix IV and BUN/ Cr increasing  - ECHO showing Gr 2 diastolic dysfunction, EF 36-64%  - 07/23/2013 ABG 7.39/46/59/27 on 30% FiO2  - continue prednisone-->wean to 30mg  daily  -goal SaO2 >92%--presently stable on 2L--94% saturation  -finish 7 days of levofloxacin and flagyl for COPD exacerbation and concerns for aspiration pneumonitis  Acute diastolic CHF  -EF 40-34%, grade 2 diastolic dysfunction  -proBNP 4353-->?partly due to renal failure  -gained 5 kg since day of admission but question accuracy of weights from different units  -Daily weights  -appreciate cardiology input--continue to hold lasix  Nausea and vomiting- possibly gastroenteritis?  - resolved  - advanced to cardiac diet  Pyuria  - culture negative  - d/c Rocephin  ETOH abuse  - CIWA scale ordered  -Drinks at least 16 ounces wine daily  Hypothyroidism  - cont levothyroxine  Toe ulcer/PVD  - added ASA and statin  - ABIs quite low - outpt vascular surgery eval  - advised to stop smoking  Accelerated hypertension  - cont Clonidine patch and Verapamil  -Added metoprolol tartrate and Imdur lower to wean nitroglycerin drip  -wean NTG drip for SBP <160-->weaned off 07/25/2013  -increase hydralazine to 100 mg every 8 hours   -increase imdur dose to 120mg  Acute on Chronic Renal Failure (CKD stage 3)  - Cr worse due to Lasix  -am BMP  -03/26/12 renal arterial duplex negative for RAS  Family Communication: Pt at beside  Disposition Plan: SNF on 07/29/13  Antibiotics:  Levofloxacin 07/23/13>>>  Flagyl 07/23/13>>>          Procedures/Studies: Ct Abdomen Pelvis Wo Contrast  07/18/2013   CLINICAL  DATA:  Mid abdominal pain, flank pain  EXAM: CT ABDOMEN AND PELVIS WITHOUT CONTRAST  TECHNIQUE: Multidetector CT imaging of the abdomen and pelvis was performed following the standard protocol without IV contrast.  COMPARISON:  None.  FINDINGS: Lower chest: Minor basilar atelectasis. No lower lobe pneumonia. Normal heart size. No pericardial or pleural effusion. Extensive atherosclerosis of the lower thoracic and abdominal aorta. Mild ectasia of the lower thoracic aorta at the diaphragmatic hiatus measuring 3.8 x 3.7 cm, image 13.  Abdomen: Several small layering calcified gallstones noted. No biliary dilatation or gallbladder distention. No inflammatory process in the right upper quadrant.  Liver, biliary system, pancreas, spleen, and adrenal glands are within normal limits for age and noncontrast imaging.  Kidneys demonstrate no acute obstruction, hydronephrosis, or obstructing ureteral calculus on either side. Ureters are symmetric and decompressed. No dilated ureter or hydroureter. Left kidney demonstrates a small hypodense parapelvic cyst measuring 16 mm with negative Hounsfield units, image 28. No other renal abnormality. Extensive calcific atherosclerosis of the aorta and iliac vessels and tortuosity. No significant abdominal aneurysm.  Negative for bowel obstruction, dilatation, ileus, or free air. Scattered colonic diverticulosis, most pronounced on the right colon.  No abdominal free fluid, fluid collection, hemorrhage, abscess, or adenopathy.  Pelvis: Bilateral adnexal ovarian cysts noted. Right ovarian cyst measures 5.4 x 3.4 cm, image 60. There is a larger left ovarian cyst measuring 8.1 x 8.4 cm, image 61. These are concerning for abnormal ovarian cysts in a postmenopausal female. Ovarian cystic  malignancy is not excluded. No associated ascites or peritoneal disease. Uterus is small and atrophic. Urinary bladder unremarkable. No acute distal bowel process.  Diffuse degenerative changes noted of the  spine. Multilevel vacuum disc phenomena noted. Normal alignment.  IMPRESSION: Negative for acute obstructing ureteral calculus, obstructive uropathy, or hydronephrosis.  Probable incidental left renal parapelvic cyst measuring 16 mm.  Aortic calcific atherosclerosis and mild ectasia, maximal diameter 3.8 cm.  Cholelithiasis  Diffuse right colon diverticulosis  Bilateral ovarian cystic masses, largest on the left ovary measures 8.4 cm. These are concerning for a cystic ovarian malignancies in a postmenopausal female. No associated ascites or peritoneal disease. This warrants further gynecologic workup.   Electronically Signed   By: Daryll Brod M.D.   On: 07/18/2013 21:54   Dg Chest Port 1 View  07/25/2013   CLINICAL DATA:  Acute respiratory failure  EXAM: PORTABLE CHEST - 1 VIEW  COMPARISON:  07/23/2013  FINDINGS: Cardiac silhouette is normal in size and configuration. Normal mediastinal and hilar contours. Than diffusely and irregularly thickened interstitial markings are similar to the prior exam. Opacity at the left lung base obscures hemidiaphragm. This may be mildly increased.  Minimal effusions are suspected.  No pneumothorax.  Changes from right breast surgery are stable.  IMPRESSION: 1. Left basilar opacity which may reflect atelectasis or infiltrate. This appears mildly increased from the prior study although the difference may be technical only. Probable minimal effusions. 2. Irregularly thickened interstitial markings similar to the prior study. This suggests diffuse interstitial edema.   Electronically Signed   By: Lajean Manes M.D.   On: 07/25/2013 11:53   Dg Chest Port 1 View  07/23/2013   CLINICAL DATA:  hypoxia  EXAM: PORTABLE CHEST - 1 VIEW  COMPARISON:  DG CHEST 1V PORT dated 07/22/2013  FINDINGS: Low lung volumes. Cardiac silhouette is enlarged. Atherosclerotic calcifications within the aorta. Increased density left lung base and blunting of the left costophrenic angle. There is diffuse  prominence of interstitial markings and areas of mild peribronchial cuffing.  IMPRESSION: Pulmonary edema  Atelectasis versus infiltrate left lung base and trace left effusion.   Electronically Signed   By: Margaree Mackintosh M.D.   On: 07/23/2013 10:06   Dg Chest Port 1 View  07/22/2013   CLINICAL DATA:  Hypoxia  EXAM: PORTABLE CHEST - 1 VIEW  COMPARISON:  July 21, 2013  FINDINGS: There is underlying emphysema. There is slightly less interstitial edema. There is patchy consolidation in the left base with small left effusion.  Heart is mildly enlarged. The pulmonary vascularity reflects pulmonary venous hypertension. There are surgical clips in the right axilla.  IMPRESSION: Congestive heart failure superimposed on underlying emphysematous change with slightly less edema compared to 1 day prior. Mild consolidation left base. No new opacity.   Electronically Signed   By: Lowella Grip M.D.   On: 07/22/2013 08:19   Dg Chest Port 1 View  07/21/2013   CLINICAL DATA:  Dyspnea  EXAM: PORTABLE CHEST - 1 VIEW  COMPARISON:  Prior chest x-ray 07/18/2013  FINDINGS: Stable mild cardiomegaly. Atherosclerotic calcifications noted in the transverse aorta. Interval development of diffuse bilateral interstitial opacities consistent with interstitial pulmonary edema. Dense left basilar opacity favored to reflect a combination of atelectasis developing alveolar edema. No pneumothorax. Small left effusion not excluded. Surgical clips again noted in the right axilla. No acute osseous abnormality.  IMPRESSION: 1. Interval development of mild CHF. 2. Left basilar opacity favored to reflect a combination of atelectasis and developing  alveolar edema. Small pleural effusion not excluded.   Electronically Signed   By: Jacqulynn Cadet M.D.   On: 07/21/2013 08:19   Dg Abd Acute W/chest  07/18/2013   CLINICAL DATA:  pain  EXAM: ACUTE ABDOMEN SERIES (ABDOMEN 2 VIEW & CHEST 1 VIEW)  COMPARISON:  DG CHEST 1V PORT dated 07/07/2012   FINDINGS: There is no evidence of dilated bowel loops or free intraperitoneal air. No radiopaque calculi or other significant radiographic abnormality is seen. Heart size and mediastinal contours are within normal limits. The lungs are hyperinflated and there is flattening of the hemidiaphragms. Lungs otherwise clear. Postsurgical changes in the right axilla.  IMPRESSION: Negative abdominal radiographs. No acute cardiopulmonary disease COPD.   Electronically Signed   By: Margaree Mackintosh M.D.   On: 07/18/2013 16:45   Dg Foot Complete Left  07/18/2013   CLINICAL DATA:  Ulcerations.  EXAM: LEFT FOOT - COMPLETE 3+ VIEW  COMPARISON:  None.  FINDINGS: Diffuse severe osteopenia and degenerative change. No acute bony or joint abnormality. If osteomyelitis is of clinical concern MRI can be obtained.  IMPRESSION: Diffuse osteopenia and degenerative change, no acute bony abnormality identified.   Electronically Signed   By: Marcello Moores  Register   On: 07/18/2013 21:59   Dg Foot Complete Right  07/18/2013   CLINICAL DATA:  Toe ulceration  EXAM: RIGHT FOOT COMPLETE - 3+ VIEW  COMPARISON:  None.  FINDINGS: No acute fracture. No dislocation. Deformity of the phalanges of the small toe has a chronic appearance. Severe osteopenia. Nonspecific subtle erosion at the lateral head of the proximal phalanx of the great toe.  IMPRESSION: No acute bony pathology.  Chronic changes.   Electronically Signed   By: Maryclare Bean M.D.   On: 07/18/2013 21:59         Subjective: Patient denies fevers, chills, headache, chest pain,  nausea, vomiting, diarrhea, abdominal pain, dysuria, hematuria. Patient still has some dyspnea on exertion, but this is significantly improved from the time of admission. Has a nonproductive cough.   Objective: Filed Vitals:   07/28/13 0929 07/28/13 1215 07/28/13 1355 07/28/13 1517  BP:   132/59   Pulse:   57   Temp:   97.8 F (36.6 C)   TempSrc:   Oral   Resp:   18   Height:      Weight:      SpO2:  95% 94% 94% 95%    Intake/Output Summary (Last 24 hours) at 07/28/13 1558 Last data filed at 07/28/13 1500  Gross per 24 hour  Intake    480 ml  Output      0 ml  Net    480 ml   Weight change: -1.8 kg (-3 lb 15.5 oz) Exam:   General:  Pt is alert, follows commands appropriately, not in acute distress  HEENT: No icterus, No thrush,  [place]/AT  Cardiovascular: RRR, S1/S2, no rubs, no gallops  Respiratory: Scattered basilar crackles. No wheezing. Good air movement.  Abdomen: Soft/+BS, non tender, non distended, no guarding  Extremities: No edema, No lymphangitis, No petechiae, No rashes, no synovitis  Data Reviewed: Basic Metabolic Panel:  Recent Labs Lab 07/24/13 0318 07/25/13 0308 07/26/13 0322 07/27/13 0526 07/28/13 0455  NA 139 137 136* 141 139  K 4.1 4.0 4.2 4.3 3.8  CL 100 100 99 102 102  CO2 26 27 27 28 27   GLUCOSE 139* 117* 102* 121* 107*  BUN 46* 51* 58* 57* 51*  CREATININE 1.67* 1.68* 1.82* 1.73*  1.63*  CALCIUM 8.9 9.1 8.8 8.5 8.5   Liver Function Tests: No results found for this basename: AST, ALT, ALKPHOS, BILITOT, PROT, ALBUMIN,  in the last 168 hours No results found for this basename: LIPASE, AMYLASE,  in the last 168 hours No results found for this basename: AMMONIA,  in the last 168 hours CBC:  Recent Labs Lab 07/22/13 0959 07/23/13 0310 07/24/13 0318 07/26/13 0322  WBC 12.8* 13.3* 13.1* 16.6*  NEUTROABS  --   --   --  14.1*  HGB 12.9 12.1 12.3 12.7  HCT 38.6 37.1 37.6 37.7  MCV 93.0 95.1 95.2 93.3  PLT 238 236 267 296   Cardiac Enzymes: No results found for this basename: CKTOTAL, CKMB, CKMBINDEX, TROPONINI,  in the last 168 hours BNP: No components found with this basename: POCBNP,  CBG:  Recent Labs Lab 07/27/13 0756 07/27/13 1148 07/27/13 1659 07/27/13 2137 07/28/13 0718  GLUCAP 78 100* 114* 139* 88    Recent Results (from the past 240 hour(s))  URINE CULTURE     Status: None   Collection Time    07/18/13  6:25 PM       Result Value Ref Range Status   Specimen Description URINE, CLEAN CATCH   Final   Special Requests Normal   Final   Culture  Setup Time     Final   Value: 07/18/2013 22:05     Performed at [place]     Final   Value: 8,000 COLONIES/ML     Performed at [facility]   Culture     Final   Value: INSIGNIFICANT GROWTH     Performed at [facility]   Report Status 07/20/2013 FINAL   Final  MRSA PCR SCREENING     Status: None   Collection Time    07/21/13  8:48 AM      Result Value Ref Range Status   MRSA by PCR NEGATIVE  NEGATIVE Final   Comment:            The GeneXpert MRSA Assay (FDA     approved for NASAL specimens     only), is one component of a     comprehensive MRSA colonization     surveillance program. It is not     intended to diagnose MRSA     infection nor to guide or     monitor treatment for     MRSA infections.     Scheduled Meds: . antiseptic oral rinse  15 mL Mouth Rinse q12n4p  . aspirin  81 mg Oral Daily  . atorvastatin  10 mg Oral q1800  . buPROPion  300 mg Oral Daily  . chlorhexidine  15 mL Mouth Rinse BID  . cloNIDine  0.1 mg Transdermal Weekly  . enoxaparin (LOVENOX) injection  30 mg Subcutaneous Q24H  . folic acid  1 mg Oral Daily  . hydrALAZINE  100 mg Oral 3 times per day  . ipratropium-albuterol  3 mL Nebulization Q4H  . [START ON 07/29/2013] isosorbide mononitrate  120 mg Oral Daily  . levofloxacin  750 mg Oral Q48H  . levothyroxine  50 mcg Oral QAC breakfast  . lovastatin  40 mg Oral QHS  . metoprolol tartrate  12.5 mg Oral BID  . metroNIDAZOLE  500 mg Oral 3 times per day  . multivitamin with minerals  1 tablet Oral Daily  . [START ON 07/29/2013] predniSONE  20 mg Oral Q breakfast  .  thiamine  100 mg Oral Daily   Or  . thiamine  100 mg Intravenous Daily  . verapamil  240 mg Oral BID   Continuous Infusions:    Orson Eva, DO  [facility] Pager [phone]  If 7PM-7AM, please contact  night-coverage [email or web address] Password [email or web address] 07/28/2013, 3:58 PM   LOS: 10 days

## 2013-07-28 NOTE — Discharge Summary (Signed)
Physician Discharge Summary  Renee Donovan:[id number] DOB: October 11, 1930 DOA: 07/18/2013  PCP: [name], MD  Admit date: 07/18/2013 Discharge date: 07/29/13 Recommendations for Outpatient Follow-up:  1. Pt will need to follow up with PCP in 2 weeks post discharge 2. Please obtain BMP in 1 wk 3. Please maintain on 2 L nasal cannula   Discharge Diagnoses:  Acute on Chronic respiratory failure-  -COPD exacerbation and fluid overload  -pt initially on BiPAP and started on IV steroids - 07/23/2013 CXR  read as "mild edema" - possibly chronic fibrosis - - received 3 doses of Lasix IV and BUN/ Cr increased -07/25/2013 chest x-ray continued increased interstitial markings - ECHO showing Gr 2 diastolic dysfunction, EF 10-93%  - 07/23/2013 ABG 7.39/46/59/27 on 30% FiO2  - continue prednisone-->wean to 30mg  daily  -goal SaO2 >92%--presently stable on 2L--94% saturation  -finish 7 days of levofloxacin and flagyl for COPD exacerbation and concerns for aspiration pneumonitis with initial vomiting -finished 7 days of antibiotics during hospitalization Acute diastolic CHF  -EF 23-55%, grade 2 diastolic dysfunction  -proBNP 4353-->?partly due to renal failure  -gained 3 kg since day of admission but question accuracy of weights from different units  -Daily weights--discharge weight 80.1 kg  -appreciate cardiology input--continue to hold lasix Until the patient is discharged -Patient will restart Lasix 20 mg daily on 07/31/2013 -remains clinically stable off of furosemide -remains stable on 2L [place] Nausea and vomiting- possibly gastroenteritis?  - resolved  - advanced to cardiac diet--tolerating well  Pyuria  - culture negative  - d/c Rocephin  ETOH abuse  - CIWA scale ordered--no signs of withdrawal  -Drinks at least 16 ounces wine daily  Hypothyroidism  - cont levothyroxine  Toe ulcer/PVD  - added ASA and statin  - ABIs quite low - outpt vascular surgery eval  - advised to stop  smoking  Accelerated hypertension  - Increase clonidine to TTS2 patch -Continue Verapamil  -Added metoprolol tartrate and Imdur lower to wean nitroglycerin drip  -wean NTG drip for SBP <160-->weaned off 07/25/2013  -increase hydralazine to 100 mg every 8 hours  -increase imdur dose to 120mg   -Continue metoprolol tartrate 12.5 mg twice a day  Acute on Chronic Renal Failure (CKD stage 3)  - Cr worse due to Lasix--peaked at 1.82; now stable in 1.6 range -Serum creatinine 1.47 on day of discharge  -Hold furosemide until 07/31/2013, then start 20 mg daily -03/26/12 renal arterial duplex negative for RAS  Family Communication: Pt at beside  Disposition Plan: SNF on 07/29/13  Antibiotics:  Levofloxacin 07/23/13>>>  Flagyl 07/23/13>>>   Discharge Condition: Stable  Disposition: Skilled nursing facility Follow-up Information   Follow up with REGAL,NORMAN SCOTT, DPM In 1 week.   Specialty:  Podiatry   Contact information:   [place] [address] [phone]       Follow up with [place] In 2 days.   Contact information:   [place] [address] [phone]       Diet:heart healthy Wt Readings from Last 3 Encounters:  07/28/13 80.2 kg (176 lb 12.9 oz)  07/09/12 74.39 kg (164 lb)  05/15/12 73.483 kg (162 lb)    History of present illness:  80 y.o. female presenting on 07/18/2013 with with history of COPD, hypothyroidism, chronic alcoholism, tobacco abuse, history of breast cancer, hypertension started experiencing nausea vomiting and left flank pain on 4/23. As her GI symptoms resolved, she developed acute dyspnea on the morning  of 4/26. This was mostly due to COPD exacerbation and partly fluid overload. She continues to require BiPAP PRN and now has significant HTN. ECHO was obtained and reveals Gr 2 diastolic dysfunction. After 3 doses of intravenous furosemide, the patient's serum creatinine had significant increase.  Furosemide was discontinued. However the patient continued to have respiratory insufficiency. Chest x-rays continued to reveal/suggested pulmonary edema. ProBNP was noted to be 4353. Because of continued renal failure and respiratory insufficiency concerning for CHF, cardiology was consulted. They felt that furosemide should continue to be held. The patient remained clinically stable. They felt that much of her respiratory insufficiency to be due to COPD. The patient gradually improved clinically. Prednisone was weaned off. The patient was continued on levofloxacin and Flagyl for concerns of aspiration pneumonitis. Physical therapy was consulted. They recommended skilled nursing facility. The patient's blood pressure was initially very difficult to control. She was placed on nitroglycerin drip for several days. This was subsequently weaned off on the patient's antihypertensive medications were titrated up. Patient does have previous renal artery duplex was negative for renal artery stenosis.    Consultants: cardiology  Discharge Exam: Filed Vitals:   07/28/13 1355  BP: 132/59  Pulse: 57  Temp: 97.8 F (36.6 C)  Resp: 18   Filed Vitals:   07/28/13 0929 07/28/13 1215 07/28/13 1355 07/28/13 1517  BP:   132/59   Pulse:   57   Temp:   97.8 F (36.6 C)   TempSrc:   Oral   Resp:   18   Height:      Weight:      SpO2: 95% 94% 94% 95%   General: A&O x 3, NAD, pleasant, cooperative Cardiovascular: RRR, no rub, no gallop, no S3 Respiratory: Bibasilar rales. No wheezing. Good air movement. Abdomen:soft, nontender, nondistended, positive bowel sounds Extremities: No edema, No lymphangitis, no petechiae  Discharge Instructions  Discharge Orders   Future Appointments Provider Department Dept Phone   10/03/2013 1:15 PM Marletta Lor, MD [facility]   Future Orders Complete By Expires   Face-to-face encounter (required for Medicare/Medicaid patients)  As  directed    Questions:     The encounter with the patient was in whole, or in part, for the following medical condition, which is the primary reason for home health care:  deconditioning/limited mobility, copd/chronic respiratory failure, foot ulcers/foot hygiene/wound care   I certify that, based on my findings, the following services are medically necessary home health services:  Nursing   My clinical findings support the need for the above services:  Unable to leave home safely without assistance and/or assistive device   Further, I certify that my clinical findings support that this patient is homebound due to:  Shortness of Breath with activity   Reason for Medically Necessary Home Health Services:  [place]  As directed    Questions:     To provide the following care/treatments:  [place] work       Medication List    TAKE these medications       ondansetron 8 MG tablet  Commonly known as:  ZOFRAN  Take 1 tablet (8 mg total) by mouth every 8 (eight) hours as needed for nausea or vomiting.      ASK your doctor about these medications       albuterol (2.5 MG/3ML) 0.083% nebulizer solution  Commonly known as:  PROVENTIL  Take 2.5 mg by nebulization every 6 (six) hours as needed for wheezing or shortness of breath.     albuterol 108 (90 BASE) MCG/ACT inhaler  Commonly known as:  PROVENTIL HFA;VENTOLIN HFA  Inhale 1-2 puffs into the lungs every 6 (six) hours as needed for wheezing or shortness of breath.     buPROPion 300 MG 24 hr tablet  Commonly known as:  WELLBUTRIN XL  Take 1 tablet (300 mg total) by mouth daily.     cephALEXin 500 MG capsule  Commonly known as:  KEFLEX  Take 500 mg by mouth 4 (four) times daily.  Ask about: Which instructions should I use?     cephALEXin 500 MG capsule  Commonly known as:  KEFLEX  Take 1 capsule (500 mg total) by mouth 4 (four) times daily.  Ask about: Which  instructions should I use?     cloNIDine 0.1 MG tablet  Commonly known as:  CATAPRES  Take 0.1 mg by mouth 2 (two) times daily.     levothyroxine 50 MCG tablet  Commonly known as:  SYNTHROID, LEVOTHROID  Take 1 tablet (50 mcg total) by mouth daily.     lisinopril-hydrochlorothiazide 20-12.5 MG per tablet  Commonly known as:  ZESTORETIC  Take 1 tablet by mouth daily.     lovastatin 40 MG tablet  Commonly known as:  MEVACOR  Take 40 mg by mouth at bedtime.     tiotropium 18 MCG inhalation capsule  Commonly known as:  SPIRIVA HANDIHALER  Place 1 capsule (18 mcg total) into inhaler and inhale daily.     verapamil 120 MG tablet  Commonly known as:  CALAN  Take 240 mg by mouth 2 (two) times daily.         The results of significant diagnostics from this hospitalization (including imaging, microbiology, ancillary and laboratory) are listed below for reference.    Significant Diagnostic Studies: Ct Abdomen Pelvis Wo Contrast  07/18/2013   CLINICAL DATA:  Mid abdominal pain, flank pain  EXAM: CT ABDOMEN AND PELVIS WITHOUT CONTRAST  TECHNIQUE: Multidetector CT imaging of the abdomen and pelvis was performed following the standard protocol without IV contrast.  COMPARISON:  None.  FINDINGS: Lower chest: Minor basilar atelectasis. No lower lobe pneumonia. Normal heart size. No pericardial or pleural effusion. Extensive atherosclerosis of the lower thoracic and abdominal aorta. Mild ectasia of the lower thoracic aorta at the diaphragmatic hiatus measuring 3.8 x 3.7 cm, image 13.  Abdomen: Several small layering calcified gallstones noted. No biliary dilatation or gallbladder distention. No inflammatory process in the right upper quadrant.  Liver, biliary system, pancreas, spleen, and adrenal glands are within normal limits for age and noncontrast imaging.  Kidneys demonstrate no acute obstruction, hydronephrosis, or obstructing ureteral calculus on either side. Ureters are symmetric and  decompressed. No dilated ureter or hydroureter. Left kidney demonstrates a small hypodense parapelvic cyst measuring 16 mm with negative Hounsfield units, image 28. No other renal abnormality. Extensive calcific atherosclerosis of the aorta and iliac vessels and tortuosity. No significant abdominal aneurysm.  Negative for bowel obstruction, dilatation, ileus, or free air. Scattered colonic diverticulosis, most pronounced on the right colon.  No abdominal free fluid, fluid collection, hemorrhage, abscess, or adenopathy.  Pelvis: Bilateral adnexal ovarian cysts noted. Right ovarian cyst measures 5.4 x 3.4 cm, image 60. There is a larger left ovarian cyst measuring 8.1 x 8.4 cm, image 61. These are concerning for abnormal ovarian cysts in a postmenopausal female. Ovarian cystic malignancy is not excluded.  No associated ascites or peritoneal disease. Uterus is small and atrophic. Urinary bladder unremarkable. No acute distal bowel process.  Diffuse degenerative changes noted of the spine. Multilevel vacuum disc phenomena noted. Normal alignment.  IMPRESSION: Negative for acute obstructing ureteral calculus, obstructive uropathy, or hydronephrosis.  Probable incidental left renal parapelvic cyst measuring 16 mm.  Aortic calcific atherosclerosis and mild ectasia, maximal diameter 3.8 cm.  Cholelithiasis  Diffuse right colon diverticulosis  Bilateral ovarian cystic masses, largest on the left ovary measures 8.4 cm. These are concerning for a cystic ovarian malignancies in a postmenopausal female. No associated ascites or peritoneal disease. This warrants further gynecologic workup.   Electronically Signed   By: Daryll Brod M.D.   On: 07/18/2013 21:54   Dg Chest Port 1 View  07/25/2013   CLINICAL DATA:  Acute respiratory failure  EXAM: PORTABLE CHEST - 1 VIEW  COMPARISON:  07/23/2013  FINDINGS: Cardiac silhouette is normal in size and configuration. Normal mediastinal and hilar contours. Than diffusely and irregularly  thickened interstitial markings are similar to the prior exam. Opacity at the left lung base obscures hemidiaphragm. This may be mildly increased.  Minimal effusions are suspected.  No pneumothorax.  Changes from right breast surgery are stable.  IMPRESSION: 1. Left basilar opacity which may reflect atelectasis or infiltrate. This appears mildly increased from the prior study although the difference may be technical only. Probable minimal effusions. 2. Irregularly thickened interstitial markings similar to the prior study. This suggests diffuse interstitial edema.   Electronically Signed   By: Lajean Manes M.D.   On: 07/25/2013 11:53   Dg Chest Port 1 View  07/23/2013   CLINICAL DATA:  hypoxia  EXAM: PORTABLE CHEST - 1 VIEW  COMPARISON:  DG CHEST 1V PORT dated 07/22/2013  FINDINGS: Low lung volumes. Cardiac silhouette is enlarged. Atherosclerotic calcifications within the aorta. Increased density left lung base and blunting of the left costophrenic angle. There is diffuse prominence of interstitial markings and areas of mild peribronchial cuffing.  IMPRESSION: Pulmonary edema  Atelectasis versus infiltrate left lung base and trace left effusion.   Electronically Signed   By: Margaree Mackintosh M.D.   On: 07/23/2013 10:06   Dg Chest Port 1 View  07/22/2013   CLINICAL DATA:  Hypoxia  EXAM: PORTABLE CHEST - 1 VIEW  COMPARISON:  July 21, 2013  FINDINGS: There is underlying emphysema. There is slightly less interstitial edema. There is patchy consolidation in the left base with small left effusion.  Heart is mildly enlarged. The pulmonary vascularity reflects pulmonary venous hypertension. There are surgical clips in the right axilla.  IMPRESSION: Congestive heart failure superimposed on underlying emphysematous change with slightly less edema compared to 1 day prior. Mild consolidation left base. No new opacity.   Electronically Signed   By: Lowella Grip M.D.   On: 07/22/2013 08:19   Dg Chest Port 1  View  07/21/2013   CLINICAL DATA:  Dyspnea  EXAM: PORTABLE CHEST - 1 VIEW  COMPARISON:  Prior chest x-ray 07/18/2013  FINDINGS: Stable mild cardiomegaly. Atherosclerotic calcifications noted in the transverse aorta. Interval development of diffuse bilateral interstitial opacities consistent with interstitial pulmonary edema. Dense left basilar opacity favored to reflect a combination of atelectasis developing alveolar edema. No pneumothorax. Small left effusion not excluded. Surgical clips again noted in the right axilla. No acute osseous abnormality.  IMPRESSION: 1. Interval development of mild CHF. 2. Left basilar opacity favored to reflect a combination of atelectasis and developing alveolar edema. Small pleural  effusion not excluded.   Electronically Signed   By: Jacqulynn Cadet M.D.   On: 07/21/2013 08:19   Dg Abd Acute W/chest  07/18/2013   CLINICAL DATA:  pain  EXAM: ACUTE ABDOMEN SERIES (ABDOMEN 2 VIEW & CHEST 1 VIEW)  COMPARISON:  DG CHEST 1V PORT dated 07/07/2012  FINDINGS: There is no evidence of dilated bowel loops or free intraperitoneal air. No radiopaque calculi or other significant radiographic abnormality is seen. Heart size and mediastinal contours are within normal limits. The lungs are hyperinflated and there is flattening of the hemidiaphragms. Lungs otherwise clear. Postsurgical changes in the right axilla.  IMPRESSION: Negative abdominal radiographs. No acute cardiopulmonary disease COPD.   Electronically Signed   By: Margaree Mackintosh M.D.   On: 07/18/2013 16:45   Dg Foot Complete Left  07/18/2013   CLINICAL DATA:  Ulcerations.  EXAM: LEFT FOOT - COMPLETE 3+ VIEW  COMPARISON:  None.  FINDINGS: Diffuse severe osteopenia and degenerative change. No acute bony or joint abnormality. If osteomyelitis is of clinical concern MRI can be obtained.  IMPRESSION: Diffuse osteopenia and degenerative change, no acute bony abnormality identified.   Electronically Signed   By: Marcello Moores  Register   On:  07/18/2013 21:59   Dg Foot Complete Right  07/18/2013   CLINICAL DATA:  Toe ulceration  EXAM: RIGHT FOOT COMPLETE - 3+ VIEW  COMPARISON:  None.  FINDINGS: No acute fracture. No dislocation. Deformity of the phalanges of the small toe has a chronic appearance. Severe osteopenia. Nonspecific subtle erosion at the lateral head of the proximal phalanx of the great toe.  IMPRESSION: No acute bony pathology.  Chronic changes.   Electronically Signed   By: Maryclare Bean M.D.   On: 07/18/2013 21:59     Microbiology: Recent Results (from the past 240 hour(s))  URINE CULTURE     Status: None   Collection Time    07/18/13  6:25 PM      Result Value Ref Range Status   Specimen Description URINE, CLEAN CATCH   Final   Special Requests Normal   Final   Culture  Setup Time     Final   Value: 07/18/2013 22:05     Performed at [facility]     Final   Value: 8,000 COLONIES/ML     Performed at [facility]   Culture     Final   Value: INSIGNIFICANT GROWTH     Performed at [facility]   Report Status 07/20/2013 FINAL   Final  MRSA PCR SCREENING     Status: None   Collection Time    07/21/13  8:48 AM      Result Value Ref Range Status   MRSA by PCR NEGATIVE  NEGATIVE Final   Comment:            The GeneXpert MRSA Assay (FDA     approved for NASAL specimens     only), is one component of a     comprehensive MRSA colonization     surveillance program. It is not     intended to diagnose MRSA     infection nor to guide or     monitor treatment for     MRSA infections.     Labs: Basic Metabolic Panel:  Recent Labs Lab 07/24/13 0318 07/25/13 0308 07/26/13 0322 07/27/13 0526 07/28/13 0455  NA 139 137 136* 141 139  K 4.1 4.0 4.2 4.3 3.8  CL 100  100 99 102 102  CO2 26 27 27 28 27   GLUCOSE 139* 117* 102* 121* 107*  BUN 46* 51* 58* 57* 51*  CREATININE 1.67* 1.68* 1.82* 1.73* 1.63*  CALCIUM 8.9 9.1 8.8 8.5 8.5   Liver Function Tests: No results found  for this basename: AST, ALT, ALKPHOS, BILITOT, PROT, ALBUMIN,  in the last 168 hours No results found for this basename: LIPASE, AMYLASE,  in the last 168 hours No results found for this basename: AMMONIA,  in the last 168 hours CBC:  Recent Labs Lab 07/22/13 0959 07/23/13 0310 07/24/13 0318 07/26/13 0322  WBC 12.8* 13.3* 13.1* 16.6*  NEUTROABS  --   --   --  14.1*  HGB 12.9 12.1 12.3 12.7  HCT 38.6 37.1 37.6 37.7  MCV 93.0 95.1 95.2 93.3  PLT 238 236 267 296   Cardiac Enzymes: No results found for this basename: CKTOTAL, CKMB, CKMBINDEX, TROPONINI,  in the last 168 hours BNP: No components found with this basename: POCBNP,  CBG:  Recent Labs Lab 07/27/13 0756 07/27/13 1148 07/27/13 1659 07/27/13 2137 07/28/13 0718  GLUCAP 78 100* 114* 139* 88    Time coordinating discharge:  Greater than 30 minutes  Signed:  Orson Eva, DO [facility] Pager: [phone] 07/28/2013, 6:00 PM

## 2013-07-29 LAB — BASIC METABOLIC PANEL
BUN: 47 mg/dL — AB (ref 6–23)
CO2: 27 mEq/L (ref 19–32)
Calcium: 8.9 mg/dL (ref 8.4–10.5)
Chloride: 102 mEq/L (ref 96–112)
Creatinine, Ser: 1.47 mg/dL — ABNORMAL HIGH (ref 0.50–1.10)
GFR calc non Af Amer: 32 mL/min — ABNORMAL LOW (ref >90)
GFR, EST AFRICAN AMERICAN: 37 mL/min — AB (ref >90)
Glucose, Bld: 98 mg/dL (ref 70–99)
Potassium: 3.9 mEq/L (ref 3.7–5.3)
Sodium: 140 mEq/L (ref 137–147)

## 2013-07-29 MED ORDER — ASPIRIN 81 MG PO CHEW: 81.0000 mg | CHEWABLE_TABLET | Freq: Every day | ORAL | Status: DC

## 2013-07-29 MED ORDER — SENNA 8.6 MG PO TABS: 2.0000 | ORAL_TABLET | Freq: Every day | ORAL | Status: DC

## 2013-07-29 MED ORDER — CLONIDINE HCL 0.2 MG/24HR TD PTWK: 0.2000 mg | MEDICATED_PATCH | TRANSDERMAL | Status: DC

## 2013-07-29 MED ORDER — ATORVASTATIN CALCIUM 10 MG PO TABS: 10.0000 mg | ORAL_TABLET | Freq: Every day | ORAL | Status: DC

## 2013-07-29 MED ORDER — FUROSEMIDE 20 MG PO TABS: 20.0000 mg | ORAL_TABLET | Freq: Every day | ORAL | Status: AC

## 2013-07-29 MED ORDER — BISACODYL 10 MG RE SUPP: 10.0000 mg | Freq: Once | RECTAL | Status: DC

## 2013-07-29 MED ORDER — CLONIDINE HCL 0.2 MG/24HR TD PTWK
0.2000 mg | MEDICATED_PATCH | TRANSDERMAL | Status: DC
  Administered 2013-07-29: 0.2 mg via TRANSDERMAL
  Filled 2013-07-29 (×2): qty 1

## 2013-07-29 MED ORDER — ISOSORBIDE MONONITRATE ER 120 MG PO TB24: 120.0000 mg | ORAL_TABLET | Freq: Every day | ORAL | Status: DC

## 2013-07-29 MED ORDER — DSS 100 MG PO CAPS: 100.0000 mg | ORAL_CAPSULE | Freq: Two times a day (BID) | ORAL | Status: DC

## 2013-07-29 MED ORDER — HYDRALAZINE HCL 100 MG PO TABS: 100.0000 mg | ORAL_TABLET | Freq: Three times a day (TID) | ORAL | Status: DC

## 2013-07-29 MED ORDER — METOPROLOL TARTRATE 12.5 MG HALF TABLET: 12.5000 mg | ORAL_TABLET | Freq: Two times a day (BID) | ORAL | Status: DC

## 2013-07-29 MED ORDER — SALINE SPRAY 0.65 % NA SOLN: 1.0000 | NASAL | Status: DC | PRN

## 2013-07-29 NOTE — Progress Notes (Signed)
Patient to be discharged to [facility], copies of all discharge medications and instructions to be sent to facility.  Patient to be transported via [facility].

## 2013-07-29 NOTE — Progress Notes (Signed)
Pt for discharge to [facility].  CSW facilitated pt discharge needs including contacting facility, faxing pt discharge information via [facility], discussing with pt at bedside and pt grandson via telephone, providing RN phone number to call report, and arranging ambulance transport for pt to [facility].  No further social work needs identified at this time.  CSW signing off.   Alison Murray, MSW, [facility] [facility] [phone]

## 2013-07-29 NOTE — Progress Notes (Signed)
Physical Therapy Treatment Patient Details Name: Renee Donovan MRN: [id number] DOB: 07-31-1930 Today's Date: 07/29/2013    History of Present Illness Pt is an 80 year old female admitted 4/23 for nausea and vomiting with history of COPD, hypothyroidism, chronic alcoholism, tobacco abuse, history of breast cancer, hypertension.  Pt also with toe ulcers suspicious for PVD per chart review.  Pt usually on 2L O2 at home and currently on 2L O2 in room.    PT Comments    Pt reporting increased fatigue and weakness today and therefore not agreeable to mobilize however eventually agreeable to exercises.  Continue to recommend SNF upon d/c.  Follow Up Recommendations  SNF;Supervision for mobility/OOB     Equipment Recommendations  None recommended by PT    Recommendations for Other Services       Precautions / Restrictions Precautions Precautions: Fall Precaution Comments: chronic O2, check sats Restrictions Weight Bearing Restrictions: No    Mobility  Bed Mobility Overal bed mobility: Needs Assistance;+2 for physical assistance       Supine to sit: +2 for physical assistance;Max assist Sit to supine: +2 for physical assistance;Max assist   General bed mobility comments: pt requesting assist stating she was really weak and increased fatigue today; required increased encourgement to sit EOB for a couple exercises  Transfers                    Ambulation/Gait                 Stairs            Wheelchair Mobility    Modified Rankin (Stroke Patients Only)       Balance                                    Cognition Arousal/Alertness: Awake/alert Behavior During Therapy: WFL for tasks assessed/performed Overall Cognitive Status: Within Functional Limits for tasks assessed                      Exercises General Exercises - Lower Extremity Quad Sets: AROM;Both;10 reps;Supine Short Arc Quad: AROM;Both;10 reps;Supine Long  Arc Quad: AROM;Both;10 reps;Seated Heel Slides: AROM;Both;10 reps;Supine Hip ABduction/ADduction: AROM;Both;10 reps;Supine (also pillow squeezes bilaterally x10 supine) Heel Raises: AROM;Both;10 reps;Seated    General Comments        Pertinent Vitals/Pain Generalized "hurting" but no specific c/o pain    Home Living                      Prior Function            PT Goals (current goals can now be found in the care plan section) Acute Rehab PT Goals PT Goal Formulation: With patient Time For Goal Achievement: 08/05/13 Potential to Achieve Goals: Good Progress towards PT goals: Progressing toward goals    Frequency  Min 3X/week    PT Plan Current plan remains appropriate    Co-evaluation             End of Session Equipment Utilized During Treatment: Oxygen Activity Tolerance: Patient limited by fatigue Patient left: in bed;with call bell/phone within reach     Time: 1059-1115 PT Time Calculation (min): 16 min  Charges:  $Therapeutic Exercise: 8-22 mins                    G Codes:  Junius Argyle 07/29/2013, 11:43 AM Carmelia Bake, PT, DPT 07/29/2013 Pager: [phone]

## 2013-07-29 NOTE — Progress Notes (Signed)
    Subjective:  No SOB, no CP. Improved.   Objective:  Vital Signs in the last 24 hours: Temp:  [97.8 F (36.6 C)-98.4 F (36.9 C)] 98.2 F (36.8 C) (05/04 0440) Pulse Rate:  [51-65] 51 (05/04 0440) Resp:  [18-20] 20 (05/04 0440) BP: (132-189)/(54-62) 189/62 mmHg (05/04 0440) SpO2:  [94 %-98 %] 98 % (05/04 0440) Weight:  [176 lb 9.4 oz (80.1 kg)] 176 lb 9.4 oz (80.1 kg) (05/04 0651)  Intake/Output from previous day: 05/03 0701 - 05/04 0700 In: 720 [P.O.:720] Out: -    Physical Exam: General: Well developed, well nourished, in no acute distress. Head:  Normocephalic and atraumatic. Lungs: Clear to auscultation and percussion. Heart: Normal S1 and S2.  No murmur, rubs or gallops.  Abdomen: soft, non-tender, positive bowel sounds. Extremities: No clubbing or cyanosis. No edema. Neurologic: Alert and oriented x 3.     Recent Labs  07/28/13 0455 07/29/13 0340  NA 139 140  K 3.8 3.9  CL 102 102  CO2 27 27  GLUCOSE 107* 98  BUN 51* 47*  CREATININE 1.63* 1.47*     . antiseptic oral rinse  15 mL Mouth Rinse q12n4p  . aspirin  81 mg Oral Daily  . atorvastatin  10 mg Oral q1800  . buPROPion  300 mg Oral Daily  . chlorhexidine  15 mL Mouth Rinse BID  . cloNIDine  0.1 mg Transdermal Weekly  . docusate sodium  100 mg Oral BID  . enoxaparin (LOVENOX) injection  30 mg Subcutaneous Q24H  . folic acid  1 mg Oral Daily  . hydrALAZINE  100 mg Oral 3 times per day  . ipratropium-albuterol  3 mL Nebulization Q6H WA  . isosorbide mononitrate  120 mg Oral Daily  . levofloxacin  750 mg Oral Q48H  . levothyroxine  50 mcg Oral QAC breakfast  . lovastatin  40 mg Oral QHS  . metoprolol tartrate  12.5 mg Oral BID  . metroNIDAZOLE  500 mg Oral 3 times per day  . multivitamin with minerals  1 tablet Oral Daily  . predniSONE  20 mg Oral Q breakfast  . senna  2 tablet Oral Daily  . thiamine  100 mg Oral Daily   Or  . thiamine  100 mg Intravenous Daily  . verapamil  240 mg Oral  BID   Assessment/Plan:   80 year old with COPD exacerbation, respiratory failure, acute diastolic HF, possible gastroenteritis, ETOH use, PVD, accelerated hypertension.   1) HTN  - multidrug reg.  - remains elevated although better overall control  - Consider increase in clonidine to 0.2.   - other meds as above.   2) Diastolic HF  - no SOB this AM.   - improved.   - Control BP    3) AKI  - creat improving.   - resume ACE once stable.   - holding diuretics.   - Consider low dose lasix 20mg  PO QD as chronic therapy.   Will sign off. Please call with any questions.   Candee Furbish 07/29/2013, 7:44 AM

## 2013-08-14 ENCOUNTER — Ambulatory Visit (INDEPENDENT_AMBULATORY_CARE_PROVIDER_SITE_OTHER): Payer: Medicare Other | Admitting: Podiatry

## 2013-08-25 ENCOUNTER — Encounter: Payer: Self-pay | Admitting: Internal Medicine

## 2013-08-29 ENCOUNTER — Ambulatory Visit: Payer: Medicare Other | Admitting: Podiatry

## 2013-09-02 ENCOUNTER — Encounter: Payer: Self-pay | Admitting: Podiatry

## 2013-09-02 ENCOUNTER — Ambulatory Visit (INDEPENDENT_AMBULATORY_CARE_PROVIDER_SITE_OTHER): Payer: Medicare Other | Admitting: Podiatry

## 2013-09-02 VITALS — BP 139/72 | HR 120 | Resp 18

## 2013-09-02 DIAGNOSIS — B351 Tinea unguium: Secondary | ICD-10-CM

## 2013-09-02 DIAGNOSIS — M79609 Pain in unspecified limb: Secondary | ICD-10-CM

## 2013-09-02 DIAGNOSIS — L97509 Non-pressure chronic ulcer of other part of unspecified foot with unspecified severity: Secondary | ICD-10-CM

## 2013-09-02 NOTE — Progress Notes (Signed)
   Subjective:    Patient ID: Renee Donovan, female    DOB: 1931-01-28, 80 y.o.   MRN: [id number]  HPI Comments: "She has these sores on her toes"  Patient c/o open wounds on 1st and 2nd toes right and 4th toe left for a few weeks. She has been in the hospital for awhile and docs said she needed to see someone for follow up. She was a heavy smoker and drinker and docs say caused poor circulation.     Review of Systems  Constitutional: Positive for appetite change and fatigue.  HENT: Positive for trouble swallowing.   Respiratory: Positive for apnea.   Gastrointestinal: Positive for nausea and vomiting.  Musculoskeletal: Positive for gait problem.  Skin: Positive for wound.  Neurological: Positive for dizziness and light-headedness.  Psychiatric/Behavioral: Positive for confusion.  All other systems reviewed and are negative.      Objective:   Physical Exam        Assessment & Plan:

## 2013-09-02 NOTE — Progress Notes (Signed)
Subjective:     Patient ID: Renee Donovan, female   DOB: 07-17-1930, 80 y.o.   MRN: [id number]  HPI patient presents with caregiver stating that she's had some discoloration on top of her toe she is very thick toenails that become painful and she's been in the hospital and was checked for circulation which as bad   Review of Systems  All other systems reviewed and are negative.      Objective:   Physical Exam  Nursing note and vitals reviewed. Constitutional: She is oriented to person, place, and time.  Musculoskeletal: Normal range of motion.  Neurological: She is oriented to person, place, and time.  Skin: Skin is dry.   neurovascular status is diminished both sharp vibratory in pulses bilateral. The toes do have perfusion noted and are reasonably warm and there is several discolored spots on the dorsum of the second and third toes right with no history of injury. Muscle strength is reduced as is range of motion subtalar and midtarsal joint     Assessment:     Appears to be areas of trauma right which are healing with possible ulcerations secondary to circulatory deficit and severe nail disease with thickness yellow brittle-like appearance that are painful 1-5 both feet    Plan:     H&P and conditions discussed with family. Since they're not currently broken down will take a wait-and-see attitude with this and she will just wear wide shoes and watch them. Debridement nailbeds 1-5 both feet with no iatrogenic bleeding noted

## 2013-09-04 ENCOUNTER — Emergency Department (HOSPITAL_COMMUNITY): Payer: Medicare Other

## 2013-09-04 ENCOUNTER — Encounter (HOSPITAL_COMMUNITY): Payer: Self-pay | Admitting: Emergency Medicine

## 2013-09-04 ENCOUNTER — Inpatient Hospital Stay (HOSPITAL_COMMUNITY)
Admission: EM | Admit: 2013-09-04 | Discharge: 2013-09-23 | DRG: 371 | Disposition: A | Discharge status: Skilled Nursing Facility | Payer: Medicare Other | Attending: Internal Medicine | Admitting: Internal Medicine

## 2013-09-04 DIAGNOSIS — Z87891 Personal history of nicotine dependence: Secondary | ICD-10-CM

## 2013-09-04 DIAGNOSIS — N184 Chronic kidney disease, stage 4 (severe): Secondary | ICD-10-CM | POA: Diagnosis present

## 2013-09-04 DIAGNOSIS — Z66 Do not resuscitate: Secondary | ICD-10-CM | POA: Diagnosis present

## 2013-09-04 DIAGNOSIS — I472 Ventricular tachycardia, unspecified: Secondary | ICD-10-CM

## 2013-09-04 DIAGNOSIS — K352 Acute appendicitis with generalized peritonitis, without abscess: Principal | ICD-10-CM | POA: Diagnosis present

## 2013-09-04 DIAGNOSIS — K5732 Diverticulitis of large intestine without perforation or abscess without bleeding: Secondary | ICD-10-CM | POA: Diagnosis present

## 2013-09-04 DIAGNOSIS — M8660 Other chronic osteomyelitis, unspecified site: Secondary | ICD-10-CM | POA: Diagnosis present

## 2013-09-04 DIAGNOSIS — J438 Other emphysema: Secondary | ICD-10-CM | POA: Diagnosis present

## 2013-09-04 DIAGNOSIS — A0472 Enterocolitis due to Clostridium difficile, not specified as recurrent: Secondary | ICD-10-CM

## 2013-09-04 DIAGNOSIS — D62 Acute posthemorrhagic anemia: Secondary | ICD-10-CM | POA: Diagnosis not present

## 2013-09-04 DIAGNOSIS — K56609 Unspecified intestinal obstruction, unspecified as to partial versus complete obstruction: Secondary | ICD-10-CM

## 2013-09-04 DIAGNOSIS — L89609 Pressure ulcer of unspecified heel, unspecified stage: Secondary | ICD-10-CM | POA: Diagnosis present

## 2013-09-04 DIAGNOSIS — J9611 Chronic respiratory failure with hypoxia: Secondary | ICD-10-CM

## 2013-09-04 DIAGNOSIS — Z803 Family history of malignant neoplasm of breast: Secondary | ICD-10-CM

## 2013-09-04 DIAGNOSIS — R1032 Left lower quadrant pain: Secondary | ICD-10-CM

## 2013-09-04 DIAGNOSIS — K35209 Acute appendicitis with generalized peritonitis, without abscess, unspecified as to perforation: Principal | ICD-10-CM | POA: Diagnosis present

## 2013-09-04 DIAGNOSIS — L8993 Pressure ulcer of unspecified site, stage 3: Secondary | ICD-10-CM | POA: Diagnosis present

## 2013-09-04 DIAGNOSIS — Z8249 Family history of ischemic heart disease and other diseases of the circulatory system: Secondary | ICD-10-CM

## 2013-09-04 DIAGNOSIS — E860 Dehydration: Secondary | ICD-10-CM | POA: Diagnosis present

## 2013-09-04 DIAGNOSIS — R000 Tachycardia, unspecified: Secondary | ICD-10-CM

## 2013-09-04 DIAGNOSIS — M869 Osteomyelitis, unspecified: Secondary | ICD-10-CM | POA: Diagnosis present

## 2013-09-04 DIAGNOSIS — Z79899 Other long term (current) drug therapy: Secondary | ICD-10-CM

## 2013-09-04 DIAGNOSIS — E876 Hypokalemia: Secondary | ICD-10-CM | POA: Clinically undetermined

## 2013-09-04 DIAGNOSIS — R1031 Right lower quadrant pain: Secondary | ICD-10-CM

## 2013-09-04 DIAGNOSIS — E43 Unspecified severe protein-calorie malnutrition: Secondary | ICD-10-CM | POA: Diagnosis present

## 2013-09-04 DIAGNOSIS — I129 Hypertensive chronic kidney disease with stage 1 through stage 4 chronic kidney disease, or unspecified chronic kidney disease: Secondary | ICD-10-CM | POA: Diagnosis present

## 2013-09-04 DIAGNOSIS — N9489 Other specified conditions associated with female genital organs and menstrual cycle: Secondary | ICD-10-CM | POA: Diagnosis present

## 2013-09-04 DIAGNOSIS — K3532 Acute appendicitis with perforation and localized peritonitis, without abscess: Secondary | ICD-10-CM

## 2013-09-04 DIAGNOSIS — J9620 Acute and chronic respiratory failure, unspecified whether with hypoxia or hypercapnia: Secondary | ICD-10-CM

## 2013-09-04 DIAGNOSIS — R112 Nausea with vomiting, unspecified: Secondary | ICD-10-CM

## 2013-09-04 DIAGNOSIS — J439 Emphysema, unspecified: Secondary | ICD-10-CM

## 2013-09-04 DIAGNOSIS — Z9981 Dependence on supplemental oxygen: Secondary | ICD-10-CM

## 2013-09-04 DIAGNOSIS — M1990 Unspecified osteoarthritis, unspecified site: Secondary | ICD-10-CM | POA: Diagnosis present

## 2013-09-04 DIAGNOSIS — Z7982 Long term (current) use of aspirin: Secondary | ICD-10-CM

## 2013-09-04 DIAGNOSIS — I4729 Other ventricular tachycardia: Secondary | ICD-10-CM | POA: Diagnosis present

## 2013-09-04 DIAGNOSIS — I10 Essential (primary) hypertension: Secondary | ICD-10-CM

## 2013-09-04 DIAGNOSIS — I509 Heart failure, unspecified: Secondary | ICD-10-CM | POA: Diagnosis present

## 2013-09-04 DIAGNOSIS — K37 Unspecified appendicitis: Secondary | ICD-10-CM

## 2013-09-04 DIAGNOSIS — I5032 Chronic diastolic (congestive) heart failure: Secondary | ICD-10-CM

## 2013-09-04 DIAGNOSIS — Z833 Family history of diabetes mellitus: Secondary | ICD-10-CM

## 2013-09-04 DIAGNOSIS — I447 Left bundle-branch block, unspecified: Secondary | ICD-10-CM | POA: Diagnosis present

## 2013-09-04 DIAGNOSIS — Z853 Personal history of malignant neoplasm of breast: Secondary | ICD-10-CM

## 2013-09-04 DIAGNOSIS — D72829 Elevated white blood cell count, unspecified: Secondary | ICD-10-CM | POA: Diagnosis present

## 2013-09-04 DIAGNOSIS — L89510 Pressure ulcer of right ankle, unstageable: Secondary | ICD-10-CM

## 2013-09-04 DIAGNOSIS — N179 Acute kidney failure, unspecified: Secondary | ICD-10-CM

## 2013-09-04 DIAGNOSIS — E785 Hyperlipidemia, unspecified: Secondary | ICD-10-CM | POA: Diagnosis present

## 2013-09-04 DIAGNOSIS — I5031 Acute diastolic (congestive) heart failure: Secondary | ICD-10-CM

## 2013-09-04 LAB — URINE MICROSCOPIC-ADD ON

## 2013-09-04 LAB — COMPREHENSIVE METABOLIC PANEL
ALK PHOS: 75 U/L (ref 39–117)
ALT: 7 U/L (ref 0–35)
AST: 17 U/L (ref 0–37)
Albumin: 2.5 g/dL — ABNORMAL LOW (ref 3.5–5.2)
BUN: 42 mg/dL — AB (ref 6–23)
CO2: 24 mEq/L (ref 19–32)
Calcium: 9 mg/dL (ref 8.4–10.5)
Chloride: 95 mEq/L — ABNORMAL LOW (ref 96–112)
Creatinine, Ser: 3.05 mg/dL — ABNORMAL HIGH (ref 0.50–1.10)
GFR, EST AFRICAN AMERICAN: 15 mL/min — AB (ref >90)
GFR, EST NON AFRICAN AMERICAN: 13 mL/min — AB (ref >90)
GLUCOSE: 134 mg/dL — AB (ref 70–99)
POTASSIUM: 3.7 meq/L (ref 3.7–5.3)
Sodium: 138 mEq/L (ref 137–147)
Total Bilirubin: 0.7 mg/dL (ref 0.3–1.2)
Total Protein: 6.8 g/dL (ref 6.0–8.3)

## 2013-09-04 LAB — CBC WITH DIFFERENTIAL/PLATELET
Basophils Absolute: 0 10*3/uL (ref 0.0–0.1)
Basophils Relative: 0 % (ref 0–1)
EOS PCT: 1 % (ref 0–5)
Eosinophils Absolute: 0.2 10*3/uL (ref 0.0–0.7)
HCT: 40.4 % (ref 36.0–46.0)
HEMOGLOBIN: 13.5 g/dL (ref 12.0–15.0)
LYMPHS ABS: 1.6 10*3/uL (ref 0.7–4.0)
Lymphocytes Relative: 11 % — ABNORMAL LOW (ref 12–46)
MCH: 30 pg (ref 26.0–34.0)
MCHC: 33.4 g/dL (ref 30.0–36.0)
MCV: 89.8 fL (ref 78.0–100.0)
MONOS PCT: 13 % — AB (ref 3–12)
Monocytes Absolute: 1.9 10*3/uL — ABNORMAL HIGH (ref 0.1–1.0)
NEUTROS PCT: 75 % (ref 43–77)
Neutro Abs: 11 10*3/uL — ABNORMAL HIGH (ref 1.7–7.7)
Platelets: 510 10*3/uL — ABNORMAL HIGH (ref 150–400)
RBC: 4.5 MIL/uL (ref 3.87–5.11)
RDW: 13.9 % (ref 11.5–15.5)
WBC: 14.7 10*3/uL — ABNORMAL HIGH (ref 4.0–10.5)

## 2013-09-04 LAB — URINALYSIS, ROUTINE W REFLEX MICROSCOPIC
Glucose, UA: NEGATIVE mg/dL
Ketones, ur: NEGATIVE mg/dL
Leukocytes, UA: NEGATIVE
NITRITE: NEGATIVE
PROTEIN: NEGATIVE mg/dL
SPECIFIC GRAVITY, URINE: 1.017 (ref 1.005–1.030)
UROBILINOGEN UA: 0.2 mg/dL (ref 0.0–1.0)
pH: 5 (ref 5.0–8.0)

## 2013-09-04 LAB — TROPONIN I: Troponin I: <0.3 ng/mL (ref <0.30)

## 2013-09-04 LAB — LIPASE, BLOOD: Lipase: 16 U/L (ref 11–59)

## 2013-09-04 LAB — MAGNESIUM: Magnesium: 1.7 mg/dL (ref 1.5–2.5)

## 2013-09-04 MED ORDER — AMIODARONE IV BOLUS ONLY 150 MG/100ML
INTRAVENOUS | Status: AC
  Administered 2013-09-04: 150 mg via INTRAVENOUS
  Filled 2013-09-04: qty 100

## 2013-09-04 MED ORDER — SODIUM CHLORIDE 0.9 % IV SOLN
1000.0000 mL | Freq: Once | INTRAVENOUS | Status: AC
  Administered 2013-09-04: 1000 mL via INTRAVENOUS

## 2013-09-04 MED ORDER — AMIODARONE IV BOLUS ONLY 150 MG/100ML
150.0000 mg | Freq: Once | INTRAVENOUS | Status: AC
  Administered 2013-09-04 (×2): 150 mg via INTRAVENOUS

## 2013-09-04 MED ORDER — DEXTROSE 5 % IV SOLN: 60.0000 mg/h | Freq: Once | INTRAVENOUS | Status: DC

## 2013-09-04 MED ORDER — LORAZEPAM 2 MG/ML IJ SOLN
0.2500 mg | Freq: Once | INTRAMUSCULAR | Status: AC
  Administered 2013-09-04: 0.25 mg via INTRAVENOUS
  Filled 2013-09-04: qty 1

## 2013-09-04 MED ORDER — ONDANSETRON HCL 4 MG/2ML IJ SOLN
4.0000 mg | Freq: Once | INTRAMUSCULAR | Status: AC
  Administered 2013-09-04: 4 mg via INTRAVENOUS
  Filled 2013-09-04: qty 2

## 2013-09-04 NOTE — ED Notes (Addendum)
Patient in vtach/wide complex tachycardia with a pulse. Rate 140-170s bpm. MD Pickering called immediately and at bedside-given 12 Lead EKG. 12 Lead captured. Patient appears to have dusky color. Arousable and at baseline orientation. Verbal for 150 mg Amiodarone bolus per MD Pickering. Hung and started immediately. Family aware of plan of care.

## 2013-09-04 NOTE — ED Notes (Signed)
Bed: ID56 Expected date:  Expected time:  Means of arrival:  Comments: EMS/80 yo from [facility]-weakness

## 2013-09-04 NOTE — ED Provider Notes (Signed)
CSN: [id number]     Arrival date & time 09/04/13  1925 History   First MD Initiated Contact with Patient 09/04/13 2000     Chief Complaint  Patient presents with  . Nausea  . Diarrhea     (Consider location/radiation/quality/duration/timing/severity/associated sxs/prior Treatment) Patient is a 80 y.o. female presenting with diarrhea. The history is provided by the patient.  Diarrhea Associated symptoms: abdominal pain and vomiting   Associated symptoms: no fever and no headaches    patient comes complaining of nausea vomiting and feeling bad. His been going for a couple weeks. She had had some diarrhea before, has not had as much recently. She states she's been throwing up some green material. She states she feels bad all over. She does have some lower abdominal pain. No fevers. She said. No cough. No dysuria. No recent antibiotics. Patient's had a decreased appetite. No relief with IM Phenergan at the nursing home.  Past Medical History  Diagnosis Date  . Hyperlipidemia   . Hypertension   . DDD (degenerative disc disease)   . DJD (degenerative joint disease)     ankles  . Cluster headache     remote  . COPD (chronic obstructive pulmonary disease)     Golds Stage II feV1 63% 11/2009  . Thyroid disorder   . Breast cancer 1991    s/p R lumpectomy and radiation    Past Surgical History  Procedure Laterality Date  . Nose surgery  1993  . Chin lift  [id number]  . Toe surgery  1965    5th toe- right foot  . Breast lumpectomy  1991    Right breast   Family History  Problem Relation Age of Onset  . Coronary artery disease Father   . Heart attack Father   . Diabetes Father   . Breast cancer Mother    History  Substance Use Topics  . Smoking status: Former Smoker -- 0.50 packs/day for 60 years    Types: Cigarettes  . Smokeless tobacco: Never Used     Comment: e cigarrette///02/08/12  . Alcohol Use: Yes     Comment: 4 glasses of wine daily   OB History   Grav Para Term Preterm  Abortions TAB SAB Ect Mult Living                 Review of Systems  Constitutional: Positive for appetite change. Negative for fever and activity change.  Eyes: Negative for pain.  Respiratory: Negative for chest tightness and shortness of breath.   Cardiovascular: Negative for chest pain and leg swelling.  Gastrointestinal: Positive for nausea, vomiting, abdominal pain and diarrhea.  Genitourinary: Negative for dysuria and flank pain.  Musculoskeletal: Negative for back pain and neck stiffness.  Skin: Negative for rash.  Neurological: Negative for weakness, numbness and headaches.  Psychiatric/Behavioral: Negative for behavioral problems.      Allergies  Review of patient's allergies indicates no known allergies.  Home Medications   Prior to Admission medications   Medication Sig Start Date End Date Taking? Authorizing Provider  albuterol (PROVENTIL HFA;VENTOLIN HFA) 108 (90 BASE) MCG/ACT inhaler Inhale 1-2 puffs into the lungs every 6 (six) hours as needed for wheezing or shortness of breath.   Yes Historical Provider, MD  albuterol (PROVENTIL) (2.5 MG/3ML) 0.083% nebulizer solution Take 2.5 mg by nebulization 2 (two) times daily.    Yes Historical Provider, MD  albuterol (PROVENTIL) (2.5 MG/3ML) 0.083% nebulizer solution Take 2.5 mg by nebulization every 4 (four) hours as needed  for wheezing or shortness of breath.   Yes Historical Provider, MD  buPROPion (WELLBUTRIN XL) 300 MG 24 hr tablet Take 1 tablet (300 mg total) by mouth daily. 01/02/13  Yes Neena Rhymes, MD  cloNIDine (CATAPRES) 0.1 MG tablet Take 0.1 mg by mouth 2 (two) times daily.   Yes Historical Provider, MD  furosemide (LASIX) 20 MG tablet Take 1 tablet (20 mg total) by mouth daily. Start on 07/31/13 07/29/13  Yes Orson Eva, MD  levothyroxine (SYNTHROID, LEVOTHROID) 50 MCG tablet Take 1 tablet (50 mcg total) by mouth daily. 07/09/12  Yes Neena Rhymes, MD  lisinopril-hydrochlorothiazide (PRINZIDE,ZESTORETIC)  20-12.5 MG per tablet Take 1 tablet by mouth daily.   Yes Historical Provider, MD  lovastatin (MEVACOR) 40 MG tablet Take 40 mg by mouth at bedtime.   Yes Historical Provider, MD  nicotine (NICODERM CQ - DOSED IN MG/24 HOURS) 14 mg/24hr patch Place 14 mg onto the skin daily.   Yes Historical Provider, MD  NON FORMULARY Take 1,200 mLs by mouth 3 (three) times daily. Medpass   Yes Historical Provider, MD  ondansetron (ZOFRAN) 8 MG tablet Take 1 tablet (8 mg total) by mouth every 8 (eight) hours as needed for nausea or vomiting. 07/18/13  Yes Mirna Mires, MD  potassium chloride SA (K-DUR,KLOR-CON) 20 MEQ tablet Take 20 mEq by mouth daily.   Yes Historical Provider, MD  tiotropium (SPIRIVA HANDIHALER) 18 MCG inhalation capsule Place 1 capsule (18 mcg total) into inhaler and inhale daily. 01/02/13  Yes Neena Rhymes, MD  verapamil (CALAN) 120 MG tablet Take 240 mg by mouth 2 (two) times daily.   Yes Historical Provider, MD  Vitamin D, Ergocalciferol, (DRISDOL) 50000 UNITS CAPS capsule Take 50,000 Units by mouth every 7 (seven) days.   Yes Historical Provider, MD  aspirin 81 MG chewable tablet Chew 1 tablet (81 mg total) by mouth daily. 07/29/13   Orson Eva, MD  atorvastatin (LIPITOR) 10 MG tablet Take 1 tablet (10 mg total) by mouth daily at 6 PM. 07/29/13   Orson Eva, MD  cephALEXin (KEFLEX) 500 MG capsule Take 1 capsule (500 mg total) by mouth 4 (four) times daily. 07/18/13   Mirna Mires, MD  cloNIDine (CATAPRES - DOSED IN MG/24 HR) 0.2 mg/24hr patch Place 1 patch (0.2 mg total) onto the skin once a week. Change every Monday 07/29/13   Orson Eva, MD  docusate sodium 100 MG CAPS Take 100 mg by mouth 2 (two) times daily. 07/29/13   Orson Eva, MD  hydrALAZINE (APRESOLINE) 100 MG tablet Take 1 tablet (100 mg total) by mouth every 8 (eight) hours. 07/29/13   Orson Eva, MD  isosorbide mononitrate (IMDUR) 120 MG 24 hr tablet Take 1 tablet (120 mg total) by mouth daily. 07/29/13   Orson Eva, MD  metoprolol tartrate  (LOPRESSOR) 12.5 mg TABS tablet Take 0.5 tablets (12.5 mg total) by mouth 2 (two) times daily. 07/29/13   Orson Eva, MD  senna (SENOKOT) 8.6 MG TABS tablet Take 2 tablets (17.2 mg total) by mouth daily. 07/29/13   Orson Eva, MD  sodium chloride (OCEAN) 0.65 % SOLN nasal spray Place 1 spray into both nostrils as needed for congestion. 07/29/13   Orson Eva, MD   BP 121/84  Pulse 98  Temp(Src) 97.8 F (36.6 C) (Oral)  Resp 24  SpO2 94% Physical Exam  Nursing note and vitals reviewed. Constitutional: She is oriented to person, place, and time. She appears well-developed.  Patient is very slender  HENT:  Head: Normocephalic and atraumatic.  Eyes: EOM are normal. Pupils are equal, round, and reactive to light.  Neck: Normal range of motion. Neck supple.  Cardiovascular: Normal rate, regular rhythm and normal heart sounds.   No murmur heard. Pulmonary/Chest: Effort normal and breath sounds normal. No respiratory distress. She has no wheezes. She has no rales.  Abdominal: Soft. She exhibits no distension. There is tenderness. There is no rebound and no guarding.  Moderate lower abdominal/suprapubic tenderness. No hernias palpated  Musculoskeletal: Normal range of motion.  Neurological: She is alert and oriented to person, place, and time. No cranial nerve deficit.  Skin: Skin is warm and dry.  Psychiatric: She has a normal mood and affect. Her speech is normal.    ED Course  Procedures (including critical care time) Labs Review Labs Reviewed  CBC WITH DIFFERENTIAL - Abnormal; Notable for the following:    WBC 14.7 (*)    Platelets 510 (*)    Neutro Abs 11.0 (*)    Lymphocytes Relative 11 (*)    Monocytes Relative 13 (*)    Monocytes Absolute 1.9 (*)    All other components within normal limits  COMPREHENSIVE METABOLIC PANEL - Abnormal; Notable for the following:    Chloride 95 (*)    Glucose, Bld 134 (*)    BUN 42 (*)    Creatinine, Ser 3.05 (*)    Albumin 2.5 (*)    GFR calc non Af  Amer 13 (*)    GFR calc Af Amer 15 (*)    All other components within normal limits  URINALYSIS, ROUTINE W REFLEX MICROSCOPIC - Abnormal; Notable for the following:    Color, Urine AMBER (*)    APPearance CLOUDY (*)    Hgb urine dipstick SMALL (*)    Bilirubin Urine SMALL (*)    All other components within normal limits  URINE MICROSCOPIC-ADD ON - Abnormal; Notable for the following:    Casts HYALINE CASTS (*)    All other components within normal limits  LIPASE, BLOOD  TROPONIN I  MAGNESIUM  TSH  LACTIC ACID, PLASMA    Imaging Review Ct Abdomen Pelvis Wo Contrast  09/05/2013   CLINICAL DATA:  Low abdominal pain.  EXAM: CT ABDOMEN AND PELVIS WITHOUT CONTRAST  TECHNIQUE: Multidetector CT imaging of the abdomen and pelvis was performed following the standard protocol without IV contrast.  COMPARISON:  CT of the abdomen and pelvis July 18, 2013.  FINDINGS: LUNG BASES: Included view of the lung bases demonstrate new small right pleural effusion with patchy airspace opacity. The visualized heart and pericardium are unchanged. At least 3.8 cm distal thoracic aortic ectasia, unchanged.  KIDNEYS/BLADDER: Kidneys are orthotopic, demonstrating normal size and morphology. No nephrolithiasis, hydronephrosis; limited assessment for renal masses on this nonenhanced examination. 17 mm left interpolar cyst similar The unopacified ureters are normal in course and caliber. Urinary bladder is partially distended and unremarkable.  SOLID ORGANS: The liver, spleen, pancreas and adrenal glands are unremarkable for this non-contrast examination. Multiple tiny cholelithiasis without CT findings of acute cholecystitis.  GASTROINTESTINAL TRACT: Small to moderate fluid filled hiatal hernia. Distended small bowel to 3.8 cm with air-fluid levels and small bowel feces. No discrete transition point, gentle tapering may reflect ileus. 2.3 x 2.9 cm inflammatory mass adjacent to the cecum, axial 57/92. The mass appears  contiguous with the appendix, and likely reflects acute or possibly perforated appendicitis. The stomach, and large bowel are normal in course and caliber without inflammatory changes, the  sensitivity may be decreased by lack of enteric contrast. Colonic diverticulosis.  PERITONEUM/RETROPERITONEUM: No intraperitoneal free fluid nor free air. Aortoiliac vessels are normal in course and caliber, with moderate to severe calcific atherosclerosis. 2.7 cm ectatic infrarenal aorta. No lymphadenopathy by CT size criteria. The left adnexal cystic mass is greatly decreased in size from 8.1 x 8.4 cm to 4.1 x 0.8 cm. Slight increase in size of right adnexal mass, previously 5.4 x 3.4 cm, now 3.8 x 5.7 cm.  SOFT TISSUES/ OSSEOUS STRUCTURES: The soft tissues and included osseous structures are nonsuspicious. Multilevel severe degenerative disc disease.  IMPRESSION: Probable acute versus possibly early perforated appendicitis as evidenced by 2.9 x 2.3 cm inflammatory mass adjacent to the cecum. No abscess or pneumoperitoneum.  Low-grade partial small bowel obstruction or, ileus from adjacent appendiceal process.  Bilaterals cystic adnexal masses, smaller on the left which would be atypical for neoplasm. Recommend follow-up ultrasound for further characterization.  Small right pleural effusion with right lower lobe airspace opacity which may reflect atelectasis, less likely early pneumonia.  Severe aortic atherosclerosis was similar multi segmental ectasia.  Preliminary findings discussed with and reconfirmed by [facility] Bellah Alia on6/10/2015at12:12 AM.   Electronically Signed   By: Elon Alas   On: 09/05/2013 00:13   Dg Chest Portable 1 View  09/04/2013   CLINICAL DATA:  Ventricular tachycardia.  Vomiting.  EXAM: PORTABLE CHEST - 1 VIEW  COMPARISON:  07/25/2013  FINDINGS: Heart size and pulmonary vascularity are normal. Lungs are clear although somewhat hyperinflated suggesting emphysema. No acute osseous abnormality.  Surgical clips of visible in the right axilla.  IMPRESSION: No acute abnormality.  Probable emphysema.   Electronically Signed   By: Rozetta Nunnery M.D.   On: 09/04/2013 21:51     EKG Interpretation   Date/Time:  Wednesday September 04 2013 21:05:51 EDT Ventricular Rate:  168 PR Interval:  111 QRS Duration: 135 QT Interval:  310 QTC Calculation: 518 R Axis:   -84 Text Interpretation:  Wide-QRS tachycardia Left bundle branch block  Confirmed by Eyob Godlewski  MD, Ovid Curd [phone]) on 09/04/2013 9:10:45 PM      MDM   Final diagnoses:  Appendicitis  ARF (acute renal failure)  Nausea & vomiting  Ventricular tachycardia  Small bowel obstruction    Patient with nausea vomiting with some diarrhea. Lab work showed worsening renal failure. During the stay in the ER she went into ventricular tachycardia. It was resolved with 150 mg of amiodarone. Lab work is otherwise reassuring except for mildly elevated white count. CT scan was done and showed likely perforated appendicitis and possible ileus versus small bowel obstruction. Patient has a DO NOT RESUSCITATE. After discussion with cardiology, general surgery, and internal medicine the patient will be admitted to a step down bed  CRITICAL CARE Performed by: Mackie Pai Total critical care time: 30 Critical care time was exclusive of separately billable procedures and treating other patients. Critical care was necessary to treat or prevent imminent or life-threatening deterioration. Critical care was time spent personally by me on the following activities: development of treatment plan with patient and/or surrogate as well as nursing, discussions with consultants, evaluation of patient's response to treatment, examination of patient, obtaining history from patient or surrogate, ordering and performing treatments and interventions, ordering and review of laboratory studies, ordering and review of radiographic studies, pulse oximetry and re-evaluation  of patient's condition.   Jasper Riling. Alvino Chapel, MD 09/05/13 0040

## 2013-09-04 NOTE — ED Notes (Addendum)
Per patient and daughter-in-law: reports emesis and diarrhea x3 weeks. Reports yellow diarrhea. Denies blood in emesis and diarrhea.  Hasn't been able to eat or drink as usual d/t problems swallowing. Reports choking. Also, per daughter-in-law, patient was diagnosed with CHF and ARF last hospitalization. In NAD. Awaiting MD. Has had breast lumpectomy on right side-right side restricted.

## 2013-09-04 NOTE — ED Notes (Addendum)
Please call Renee Donovan for questions/concerns and if patient is transferred. Phone number is listed under additional information in patient demographics.

## 2013-09-04 NOTE — ED Notes (Signed)
Per EMS- from [facility] out for emesis, D, generalized weakness. Ritta Slot thinks patient is dehydrated. All symptoms experienced x2 weeks. Has written orders for phenergan IM PRN and has been receiving this medication without alleviation. Not c/o pain or discomfort. VS: BP 150/90 HR 102 SpO2 96% on 2L [place] (home O2). Temp 97.2 CBG 152. MOST form at bedside.

## 2013-09-05 ENCOUNTER — Encounter (HOSPITAL_COMMUNITY): Payer: Self-pay

## 2013-09-05 DIAGNOSIS — R52 Pain, unspecified: Secondary | ICD-10-CM

## 2013-09-05 DIAGNOSIS — R197 Diarrhea, unspecified: Secondary | ICD-10-CM

## 2013-09-05 DIAGNOSIS — K352 Acute appendicitis with generalized peritonitis, without abscess: Principal | ICD-10-CM

## 2013-09-05 DIAGNOSIS — I5032 Chronic diastolic (congestive) heart failure: Secondary | ICD-10-CM | POA: Diagnosis present

## 2013-09-05 DIAGNOSIS — K3532 Acute appendicitis with perforation and localized peritonitis, without abscess: Secondary | ICD-10-CM | POA: Diagnosis present

## 2013-09-05 DIAGNOSIS — R0902 Hypoxemia: Secondary | ICD-10-CM

## 2013-09-05 DIAGNOSIS — K37 Unspecified appendicitis: Secondary | ICD-10-CM

## 2013-09-05 DIAGNOSIS — E43 Unspecified severe protein-calorie malnutrition: Secondary | ICD-10-CM | POA: Diagnosis present

## 2013-09-05 DIAGNOSIS — R112 Nausea with vomiting, unspecified: Secondary | ICD-10-CM

## 2013-09-05 DIAGNOSIS — J438 Other emphysema: Secondary | ICD-10-CM

## 2013-09-05 DIAGNOSIS — R1031 Right lower quadrant pain: Secondary | ICD-10-CM | POA: Insufficient documentation

## 2013-09-05 DIAGNOSIS — J9611 Chronic respiratory failure with hypoxia: Secondary | ICD-10-CM | POA: Diagnosis present

## 2013-09-05 DIAGNOSIS — I4729 Other ventricular tachycardia: Secondary | ICD-10-CM

## 2013-09-05 DIAGNOSIS — R109 Unspecified abdominal pain: Secondary | ICD-10-CM

## 2013-09-05 DIAGNOSIS — J9610 Chronic respiratory failure, unspecified whether with hypoxia or hypercapnia: Secondary | ICD-10-CM

## 2013-09-05 DIAGNOSIS — I509 Heart failure, unspecified: Secondary | ICD-10-CM

## 2013-09-05 DIAGNOSIS — I517 Cardiomegaly: Secondary | ICD-10-CM

## 2013-09-05 DIAGNOSIS — N179 Acute kidney failure, unspecified: Secondary | ICD-10-CM

## 2013-09-05 DIAGNOSIS — I472 Ventricular tachycardia, unspecified: Secondary | ICD-10-CM | POA: Diagnosis present

## 2013-09-05 DIAGNOSIS — I499 Cardiac arrhythmia, unspecified: Secondary | ICD-10-CM

## 2013-09-05 DIAGNOSIS — I10 Essential (primary) hypertension: Secondary | ICD-10-CM

## 2013-09-05 DIAGNOSIS — R1032 Left lower quadrant pain: Secondary | ICD-10-CM

## 2013-09-05 LAB — CBC
HCT: 39.5 % (ref 36.0–46.0)
Hemoglobin: 13.2 g/dL (ref 12.0–15.0)
MCH: 30.1 pg (ref 26.0–34.0)
MCHC: 33.4 g/dL (ref 30.0–36.0)
MCV: 90.2 fL (ref 78.0–100.0)
Platelets: 448 10*3/uL — ABNORMAL HIGH (ref 150–400)
RBC: 4.38 MIL/uL (ref 3.87–5.11)
RDW: 13.9 % (ref 11.5–15.5)
WBC: 12.7 10*3/uL — ABNORMAL HIGH (ref 4.0–10.5)

## 2013-09-05 LAB — MRSA PCR SCREENING: MRSA by PCR: NEGATIVE

## 2013-09-05 LAB — BASIC METABOLIC PANEL
BUN: 44 mg/dL — AB (ref 6–23)
CALCIUM: 8 mg/dL — AB (ref 8.4–10.5)
CO2: 26 mEq/L (ref 19–32)
CREATININE: 3 mg/dL — AB (ref 0.50–1.10)
Chloride: 98 mEq/L (ref 96–112)
GFR calc non Af Amer: 14 mL/min — ABNORMAL LOW (ref >90)
GFR, EST AFRICAN AMERICAN: 16 mL/min — AB (ref >90)
Glucose, Bld: 103 mg/dL — ABNORMAL HIGH (ref 70–99)
Potassium: 3.5 mEq/L — ABNORMAL LOW (ref 3.7–5.3)
Sodium: 140 mEq/L (ref 137–147)

## 2013-09-05 LAB — TSH: TSH: 3.33 u[IU]/mL (ref 0.350–4.500)

## 2013-09-05 LAB — PROTIME-INR
INR: 1.54 — ABNORMAL HIGH (ref 0.00–1.49)
PROTHROMBIN TIME: 18.1 s — AB (ref 11.6–15.2)

## 2013-09-05 LAB — TROPONIN I: Troponin I: <0.3 ng/mL (ref <0.30)

## 2013-09-05 LAB — CLOSTRIDIUM DIFFICILE BY PCR: CDIFFPCR: POSITIVE — AB

## 2013-09-05 LAB — LACTIC ACID, PLASMA: LACTIC ACID, VENOUS: 0.9 mmol/L (ref 0.5–2.2)

## 2013-09-05 MED ORDER — SODIUM CHLORIDE 0.9 % IV BOLUS (SEPSIS)
500.0000 mL | Freq: Once | INTRAVENOUS | Status: AC
  Administered 2013-09-05: 500 mL via INTRAVENOUS

## 2013-09-05 MED ORDER — SODIUM CHLORIDE 0.9 % IV SOLN
INTRAVENOUS | Status: DC
  Administered 2013-09-05 – 2013-09-13 (×7): via INTRAVENOUS

## 2013-09-05 MED ORDER — HYDROMORPHONE HCL PF 1 MG/ML IJ SOLN: 1.0000 mg | INTRAMUSCULAR | Status: DC | PRN

## 2013-09-05 MED ORDER — PIPERACILLIN-TAZOBACTAM 3.375 G IVPB
3.3750 g | Freq: Once | INTRAVENOUS | Status: AC
  Administered 2013-09-05: 3.375 g via INTRAVENOUS
  Filled 2013-09-05: qty 50

## 2013-09-05 MED ORDER — POTASSIUM CHLORIDE 10 MEQ/100ML IV SOLN
10.0000 meq | INTRAVENOUS | Status: AC
  Administered 2013-09-05 (×2): 10 meq via INTRAVENOUS
  Filled 2013-09-05 (×2): qty 100

## 2013-09-05 MED ORDER — ALBUTEROL SULFATE (2.5 MG/3ML) 0.083% IN NEBU: 3.0000 mL | INHALATION_SOLUTION | Freq: Four times a day (QID) | RESPIRATORY_TRACT | Status: DC | PRN

## 2013-09-05 MED ORDER — SODIUM CHLORIDE 0.9 % IV SOLN: INTRAVENOUS | Status: DC

## 2013-09-05 MED ORDER — ISOSORBIDE MONONITRATE ER 60 MG PO TB24
120.0000 mg | ORAL_TABLET | Freq: Every day | ORAL | Status: DC
  Administered 2013-09-05 – 2013-09-23 (×19): 120 mg via ORAL
  Filled 2013-09-05 (×20): qty 2

## 2013-09-05 MED ORDER — METOPROLOL TARTRATE 25 MG PO TABS
25.0000 mg | ORAL_TABLET | Freq: Two times a day (BID) | ORAL | Status: DC
  Administered 2013-09-05 – 2013-09-23 (×37): 25 mg via ORAL
  Filled 2013-09-05 (×39): qty 1

## 2013-09-05 MED ORDER — METRONIDAZOLE IN NACL 5-0.79 MG/ML-% IV SOLN
500.0000 mg | Freq: Three times a day (TID) | INTRAVENOUS | Status: DC
  Administered 2013-09-05: 500 mg via INTRAVENOUS

## 2013-09-05 MED ORDER — CHLORHEXIDINE GLUCONATE 0.12 % MT SOLN
15.0000 mL | Freq: Two times a day (BID) | OROMUCOSAL | Status: DC
Start: 2013-09-05 — End: 2013-09-05
  Administered 2013-09-05: 15 mL via OROMUCOSAL

## 2013-09-05 MED ORDER — METRONIDAZOLE IN NACL 5-0.79 MG/ML-% IV SOLN
500.0000 mg | Freq: Three times a day (TID) | INTRAVENOUS | Status: DC
Start: 2013-09-05 — End: 2013-09-13
  Administered 2013-09-05 – 2013-09-13 (×24): 500 mg via INTRAVENOUS
  Filled 2013-09-05 (×24): qty 100

## 2013-09-05 MED ORDER — CHLORHEXIDINE GLUCONATE 0.12 % MT SOLN
15.0000 mL | Freq: Two times a day (BID) | OROMUCOSAL | Status: DC
  Administered 2013-09-05 – 2013-09-22 (×22): 15 mL via OROMUCOSAL
  Filled 2013-09-05 (×41): qty 15

## 2013-09-05 MED ORDER — BIOTENE DRY MOUTH MT LIQD: 15.0000 mL | Freq: Two times a day (BID) | OROMUCOSAL | Status: DC

## 2013-09-05 MED ORDER — ONDANSETRON HCL 4 MG PO TABS: 4.0000 mg | ORAL_TABLET | Freq: Four times a day (QID) | ORAL | Status: DC | PRN

## 2013-09-05 MED ORDER — ALBUTEROL SULFATE (2.5 MG/3ML) 0.083% IN NEBU
2.5000 mg | INHALATION_SOLUTION | Freq: Two times a day (BID) | RESPIRATORY_TRACT | Status: DC
  Administered 2013-09-05 – 2013-09-08 (×8): 2.5 mg via RESPIRATORY_TRACT
  Filled 2013-09-05 (×8): qty 3

## 2013-09-05 MED ORDER — TIOTROPIUM BROMIDE MONOHYDRATE 18 MCG IN CAPS
18.0000 ug | ORAL_CAPSULE | Freq: Every day | RESPIRATORY_TRACT | Status: DC
  Administered 2013-09-05 – 2013-09-23 (×15): 18 ug via RESPIRATORY_TRACT
  Filled 2013-09-05 (×3): qty 5

## 2013-09-05 MED ORDER — SODIUM CHLORIDE 0.9 % IV SOLN
INTRAVENOUS | Status: DC
  Administered 2013-09-05: 03:00:00 via INTRAVENOUS

## 2013-09-05 MED ORDER — CLONIDINE HCL 0.2 MG/24HR TD PTWK
0.2000 mg | MEDICATED_PATCH | TRANSDERMAL | Status: DC
  Administered 2013-09-09 – 2013-09-23 (×3): 0.2 mg via TRANSDERMAL
  Filled 2013-09-05 (×3): qty 1

## 2013-09-05 MED ORDER — PIPERACILLIN-TAZOBACTAM IN DEX 2-0.25 GM/50ML IV SOLN
2.2500 g | Freq: Four times a day (QID) | INTRAVENOUS | Status: DC
  Administered 2013-09-05 – 2013-09-08 (×13): 2.25 g via INTRAVENOUS
  Filled 2013-09-05 (×13): qty 50

## 2013-09-05 MED ORDER — BIOTENE DRY MOUTH MT LIQD
15.0000 mL | Freq: Two times a day (BID) | OROMUCOSAL | Status: DC
  Administered 2013-09-06 – 2013-09-22 (×25): 15 mL via OROMUCOSAL

## 2013-09-05 MED ORDER — MAGNESIUM SULFATE 40 MG/ML IJ SOLN
2.0000 g | Freq: Once | INTRAMUSCULAR | Status: AC
  Administered 2013-09-05: 2 g via INTRAVENOUS
  Filled 2013-09-05: qty 50

## 2013-09-05 MED ORDER — ALBUTEROL SULFATE (2.5 MG/3ML) 0.083% IN NEBU: 2.5000 mg | INHALATION_SOLUTION | RESPIRATORY_TRACT | Status: DC | PRN

## 2013-09-05 MED ORDER — SODIUM CHLORIDE 0.9 % IV BOLUS (SEPSIS): 1000.0000 mL | Freq: Once | INTRAVENOUS | Status: AC

## 2013-09-05 MED ORDER — ONDANSETRON HCL 4 MG/2ML IJ SOLN
4.0000 mg | Freq: Four times a day (QID) | INTRAMUSCULAR | Status: DC | PRN
  Administered 2013-09-11: 4 mg via INTRAVENOUS
  Filled 2013-09-05 (×2): qty 2

## 2013-09-05 NOTE — Progress Notes (Signed)
ANTIBIOTIC CONSULT NOTE - INITIAL  Pharmacy Consult for Zosyn Indication: Intra-abdominal infection  No Known Allergies  Patient Measurements: Height: 5\' 6"  (167.6 cm) Weight: 141 lb 8.6 oz (64.2 kg) IBW/kg (Calculated) : 59.3   Vital Signs: Temp: 97.8 F (36.6 C) (06/10 1932) Temp src: Oral (06/10 1932) BP: 165/89 mmHg (06/11 0100) Pulse Rate: 101 (06/11 0100) Intake/Output from previous day:   Intake/Output from this shift:    Labs:  Recent Labs  09/04/13 1953  WBC 14.7*  HGB 13.5  PLT 510*  CREATININE 3.05*   Estimated Creatinine Clearance: 13.5 ml/min (by C-G formula based on Cr of 3.05). No results found for this basename: VANCOTROUGH, VANCOPEAK, VANCORANDOM, GENTTROUGH, GENTPEAK, GENTRANDOM, TOBRATROUGH, TOBRAPEAK, TOBRARND, AMIKACINPEAK, AMIKACINTROU, AMIKACIN,  in the last 72 hours   Microbiology: No results found for this or any previous visit (from the past 720 hour(s)).  Medical History: Past Medical History  Diagnosis Date  . Hyperlipidemia   . Hypertension   . DDD (degenerative disc disease)   . DJD (degenerative joint disease)     ankles  . Cluster headache     remote  . COPD (chronic obstructive pulmonary disease)     Golds Stage II feV1 63% 11/2009  . Thyroid disorder   . Breast cancer 1991    s/p R lumpectomy and radiation     Medications:  Scheduled:  . sodium chloride   Intravenous STAT  . albuterol  2.5 mg Nebulization BID  . antiseptic oral rinse  15 mL Mouth Rinse q12n4p  . chlorhexidine  15 mL Mouth Rinse BID  . [START ON 09/09/2013] cloNIDine  0.2 mg Transdermal Weekly  . isosorbide mononitrate  120 mg Oral Daily  . magnesium sulfate 1 - 4 g bolus IVPB  2 g Intravenous Once  . metoprolol tartrate  25 mg Oral BID  . piperacillin-tazobactam (ZOSYN)  IV  2.25 g Intravenous Q6H  . potassium chloride  10 mEq Intravenous Q1 Hr x 2  . sodium chloride  1,000 mL Intravenous Once  . tiotropium  18 mcg Inhalation Daily   Infusions:   . sodium chloride     Assessment: 80 yo c/o n/v/d x ~ 2 weeks. Zosyn per Rx for intra-abdominal infection.  Goal of Therapy:  Treat infection  Plan:   Zosyn 3.375 Gm x1  Zosyn 2.25 Gm IV q6h   F/u Scr/cultures as needed  Lawana Pai R 09/05/2013,2:17 AM

## 2013-09-05 NOTE — Consult Note (Addendum)
CARDIOLOGY CONSULT NOTE  Patient ID: Renee Donovan, MRN: [id number], DOB/AGE: 05-07-1930 80 y.o. Admit date: 09/04/2013 Date of Consult: 09/05/2013  Primary Physician: [name], MD Primary Cardiologist: see inpatient by [facility]  Chief Complaint: nausea, vomiting Reason for Consultation: wide complex tachycardia  HPI: 80 y.o. female w/ PMHx significant for multiple medical problems including HTN, HFpEF, COPD who presented to [facility] on 09/05/2013 with complaints of nausea and vomiting. During her ER visit, she had an episode of wide complex tachycardia that persistent for over 10 mins that resolved with 150 mg amiodarone bolus.  Recently hospitalized with COPD, CHF, toe ulceration, respiratory failure and discharged 5/4. Had difficult to control blood pressure. Echo done during hospitalization demonstrated pseudonormal diastolic dysfunction, with normal EF.   Underwent CT which suggested appendicitis with partial SBO.   EKG demonstrates wide complex tach in LBBB shape, very regular, without concordance with rapid downstroke. Possible p-wave just after qrs complex. Follow up EKG shows sinus, pwrp, nonspec ST flattening laterallly. Qtc of 450 ms.   She states she doesn't recall palpitations, chest pain, shortness of breath during the episode.  Past Medical History  Diagnosis Date  . Hyperlipidemia   . Hypertension   . DDD (degenerative disc disease)   . DJD (degenerative joint disease)     ankles  . Cluster headache     remote  . COPD (chronic obstructive pulmonary disease)     Golds Stage II feV1 63% 11/2009  . Thyroid disorder   . Breast cancer 1991    s/p R lumpectomy and radiation       Surgical History:  Past Surgical History  Procedure Laterality Date  . Nose surgery  1993  . Chin lift  [id number]  . Toe surgery  1965    5th toe- right foot  . Breast lumpectomy  1991    Right breast     Home Meds: Prior to Admission medications   Medication  Sig Start Date End Date Taking? Authorizing Provider  albuterol (PROVENTIL HFA;VENTOLIN HFA) 108 (90 BASE) MCG/ACT inhaler Inhale 1-2 puffs into the lungs every 6 (six) hours as needed for wheezing or shortness of breath.   Yes Historical Provider, MD  albuterol (PROVENTIL) (2.5 MG/3ML) 0.083% nebulizer solution Take 2.5 mg by nebulization 2 (two) times daily.    Yes Historical Provider, MD  albuterol (PROVENTIL) (2.5 MG/3ML) 0.083% nebulizer solution Take 2.5 mg by nebulization every 4 (four) hours as needed for wheezing or shortness of breath.   Yes Historical Provider, MD  buPROPion (WELLBUTRIN XL) 300 MG 24 hr tablet Take 1 tablet (300 mg total) by mouth daily. 01/02/13  Yes Neena Rhymes, MD  cloNIDine (CATAPRES) 0.1 MG tablet Take 0.1 mg by mouth 2 (two) times daily.   Yes Historical Provider, MD  furosemide (LASIX) 20 MG tablet Take 1 tablet (20 mg total) by mouth daily. Start on 07/31/13 07/29/13  Yes Orson Eva, MD  levothyroxine (SYNTHROID, LEVOTHROID) 50 MCG tablet Take 1 tablet (50 mcg total) by mouth daily. 07/09/12  Yes Neena Rhymes, MD  lisinopril-hydrochlorothiazide (PRINZIDE,ZESTORETIC) 20-12.5 MG per tablet Take 1 tablet by mouth daily.   Yes Historical Provider, MD  lovastatin (MEVACOR) 40 MG tablet Take 40 mg by mouth at bedtime.   Yes Historical Provider, MD  nicotine (NICODERM CQ - DOSED IN MG/24 HOURS) 14 mg/24hr patch Place 14 mg onto the skin daily.   Yes Historical Provider, MD  NON FORMULARY Take 1,200 mLs by mouth 3 (three)  times daily. Medpass   Yes Historical Provider, MD  ondansetron (ZOFRAN) 8 MG tablet Take 1 tablet (8 mg total) by mouth every 8 (eight) hours as needed for nausea or vomiting. 07/18/13  Yes Mirna Mires, MD  potassium chloride SA (K-DUR,KLOR-CON) 20 MEQ tablet Take 20 mEq by mouth daily.   Yes Historical Provider, MD  tiotropium (SPIRIVA HANDIHALER) 18 MCG inhalation capsule Place 1 capsule (18 mcg total) into inhaler and inhale daily. 01/02/13  Yes  Neena Rhymes, MD  verapamil (CALAN) 120 MG tablet Take 240 mg by mouth 2 (two) times daily.   Yes Historical Provider, MD  Vitamin D, Ergocalciferol, (DRISDOL) 50000 UNITS CAPS capsule Take 50,000 Units by mouth every 7 (seven) days.   Yes Historical Provider, MD  aspirin 81 MG chewable tablet Chew 1 tablet (81 mg total) by mouth daily. 07/29/13   Orson Eva, MD  atorvastatin (LIPITOR) 10 MG tablet Take 1 tablet (10 mg total) by mouth daily at 6 PM. 07/29/13   Orson Eva, MD  cephALEXin (KEFLEX) 500 MG capsule Take 1 capsule (500 mg total) by mouth 4 (four) times daily. 07/18/13   Mirna Mires, MD  cloNIDine (CATAPRES - DOSED IN MG/24 HR) 0.2 mg/24hr patch Place 1 patch (0.2 mg total) onto the skin once a week. Change every Monday 07/29/13   Orson Eva, MD  docusate sodium 100 MG CAPS Take 100 mg by mouth 2 (two) times daily. 07/29/13   Orson Eva, MD  hydrALAZINE (APRESOLINE) 100 MG tablet Take 1 tablet (100 mg total) by mouth every 8 (eight) hours. 07/29/13   Orson Eva, MD  isosorbide mononitrate (IMDUR) 120 MG 24 hr tablet Take 1 tablet (120 mg total) by mouth daily. 07/29/13   Orson Eva, MD  metoprolol tartrate (LOPRESSOR) 12.5 mg TABS tablet Take 0.5 tablets (12.5 mg total) by mouth 2 (two) times daily. 07/29/13   Orson Eva, MD  senna (SENOKOT) 8.6 MG TABS tablet Take 2 tablets (17.2 mg total) by mouth daily. 07/29/13   Orson Eva, MD  sodium chloride (OCEAN) 0.65 % SOLN nasal spray Place 1 spray into both nostrils as needed for congestion. 07/29/13   Orson Eva, MD    Inpatient Medications:    Allergies: No Known Allergies  History   Social History  . Marital Status: Divorced    Spouse Name: N/A    Number of Children: 73  . Years of Education: 12   Occupational History  . retired     Therapist, occupational, Scientist, research (life sciences) estate   Social History Main Topics  . Smoking status: Former Smoker -- 0.50 packs/day for 60 years    Types: Cigarettes  . Smokeless tobacco: Never Used     Comment: e  cigarrette///02/08/12  . Alcohol Use: Yes     Comment: 4 glasses of wine daily  . Drug Use: No  . Sexual Activity: Not on file   Other Topics Concern  . 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 Married 1956- 3 years, divorced 31- 75 years- divorced   1 son- 67, 3 adopted daughter Graham: 2 grandchildren. Work- Radio broadcast assistant, Risk manager, Personal assistant, retired. $$- tight. Lives alone- house owned/paid for. Son lives nearby   ACP-Living will- wants to full code. Did discuss probabilities of success and chance of permanent mechanical ventilation. Will discuss further at next visit. (April '13).  Current smoker 1 ppd x 60 years. 4 glasses of wine daily.  Family History  Problem Relation Age of Onset  . Coronary artery disease Father   . Heart attack Father   . Diabetes Father   . Breast cancer Mother      Review of Systems: Limited by patient's questionable mental status.  Labs:  Recent Labs  09/04/13 1953  TROPONINI <0.30   Lab Results  Component Value Date   WBC 14.7* 09/04/2013   HGB 13.5 09/04/2013   HCT 40.4 09/04/2013   MCV 89.8 09/04/2013   PLT 510* 09/04/2013    Recent Labs Lab 09/04/13 1953  NA 138  K 3.7  CL 95*  CO2 24  BUN 42*  CREATININE 3.05*  CALCIUM 9.0  PROT 6.8  BILITOT 0.7  ALKPHOS 75  ALT 7  AST 17  GLUCOSE 134*   Lab Results  Component Value Date   CHOL 196 03/18/2012   HDL 79 03/18/2012   LDLCALC 101* 03/18/2012   TRIG 78 03/18/2012   No results found for this basename: DDIMER    Radiology/Studies:  Ct Abdomen Pelvis Wo Contrast  09/05/2013   CLINICAL DATA:  Low abdominal pain.  EXAM: CT ABDOMEN AND PELVIS WITHOUT CONTRAST  TECHNIQUE: Multidetector CT imaging of the abdomen and pelvis was performed following the standard protocol without IV contrast.  COMPARISON:  CT of the abdomen and pelvis July 18, 2013.  FINDINGS: LUNG BASES: Included view of the lung bases demonstrate new  small right pleural effusion with patchy airspace opacity. The visualized heart and pericardium are unchanged. At least 3.8 cm distal thoracic aortic ectasia, unchanged.  KIDNEYS/BLADDER: Kidneys are orthotopic, demonstrating normal size and morphology. No nephrolithiasis, hydronephrosis; limited assessment for renal masses on this nonenhanced examination. 17 mm left interpolar cyst similar The unopacified ureters are normal in course and caliber. Urinary bladder is partially distended and unremarkable.  SOLID ORGANS: The liver, spleen, pancreas and adrenal glands are unremarkable for this non-contrast examination. Multiple tiny cholelithiasis without CT findings of acute cholecystitis.  GASTROINTESTINAL TRACT: Small to moderate fluid filled hiatal hernia. Distended small bowel to 3.8 cm with air-fluid levels and small bowel feces. No discrete transition point, gentle tapering may reflect ileus. 2.3 x 2.9 cm inflammatory mass adjacent to the cecum, axial 57/92. The mass appears contiguous with the appendix, and likely reflects acute or possibly perforated appendicitis. The stomach, and large bowel are normal in course and caliber without inflammatory changes, the sensitivity may be decreased by lack of enteric contrast. Colonic diverticulosis.  PERITONEUM/RETROPERITONEUM: No intraperitoneal free fluid nor free air. Aortoiliac vessels are normal in course and caliber, with moderate to severe calcific atherosclerosis. 2.7 cm ectatic infrarenal aorta. No lymphadenopathy by CT size criteria. The left adnexal cystic mass is greatly decreased in size from 8.1 x 8.4 cm to 4.1 x 0.8 cm. Slight increase in size of right adnexal mass, previously 5.4 x 3.4 cm, now 3.8 x 5.7 cm.  SOFT TISSUES/ OSSEOUS STRUCTURES: The soft tissues and included osseous structures are nonsuspicious. Multilevel severe degenerative disc disease.  IMPRESSION: Probable acute versus possibly early perforated appendicitis as evidenced by 2.9 x 2.3 cm  inflammatory mass adjacent to the cecum. No abscess or pneumoperitoneum.  Low-grade partial small bowel obstruction or, ileus from adjacent appendiceal process.  Bilaterals cystic adnexal masses, smaller on the left which would be atypical for neoplasm. Recommend follow-up ultrasound for further characterization.  Small right pleural effusion with right lower lobe airspace opacity which may reflect atelectasis, less likely early pneumonia.  Severe aortic atherosclerosis was similar multi  segmental ectasia.  Preliminary findings discussed with and reconfirmed by [facility] PICKERING on6/10/2015at12:12 AM.   Electronically Signed   By: Elon Alas   On: 09/05/2013 00:13   Dg Chest Portable 1 View  09/04/2013   CLINICAL DATA:  Ventricular tachycardia.  Vomiting.  EXAM: PORTABLE CHEST - 1 VIEW  COMPARISON:  07/25/2013  FINDINGS: Heart size and pulmonary vascularity are normal. Lungs are clear although somewhat hyperinflated suggesting emphysema. No acute osseous abnormality. Surgical clips of visible in the right axilla.  IMPRESSION: No acute abnormality.  Probable emphysema.   Electronically Signed   By: Rozetta Nunnery M.D.   On: 09/04/2013 21:51    EKG: see HPI  Physical Exam Blood pressure 121/84, pulse 98, temperature 97.8 F (36.6 C), temperature source Oral, resp. rate 24, SpO2 94.00%. General: , in no acute distress. Resting awkardly in bed Head: Normocephalic, atraumatic, Neck: Supple. Negative for carotid bruits. JVD at 8 cm Lungs: poor airmovement throughout, slight exp wheezing Heart: RRR with S1 S2. No murmurs, rubs, or gallops appreciated. Abdomen: nondistended. Msk:  Strength and tone appear normal for age. Extremities: No clubbing or cyanosis. No edema.  Distal pedal pulses are 2+ and equal bilaterally. Discolored toes but warm and dry Neuro: Alert. Moves all extremities spontaneously. Psych:  Answers questions but disoriented by dates   1. Wide complex tachycardia, resolved 2.  HFpEF, not in acute heart failure 3. HTN, difficult to control 4. COPD 5. Possible appendicitis by CT though exam is not consistent 6. Hypothyroid, on replacement, TSH pending 7. Acute on chronic CKD  Assessment and Plan:  80 y.o. female w/ PMHx significant for multiple medical problems including HTN, HFpEF, COPD who presented to [facility] on 09/05/2013 with complaints of nausea and vomiting. During her ER visit, she had an episode of wide complex tachycardia that persistent for over 10 mins that resolved with 150 mg amiodarone bolus.  Not clearly ventricular tachycardia. No concordance, rapid downstroke, no A-V dissociation. Also with normal EF by echo which argues against Vtach. Other possibility is SVT with aberrency though typically that is a RBBB rather than a left. Mixed picture, will need assistance from EP in the morning. If arrhythmia returns, would start amiodarone gtt. Agree with increased level of care. Continue telemetry monitoring.  Encouragingly, per the patient, she was asymptomatic and hemodynamically stable during the arrhythmia. Responded to amiodarone. At this time, would continue her cardiac medications though would favor increasing the metoprolol to 25 TID and hold verapamil at this time. Hold ACEI/HCTZ due to ARF, continue clonidine, hydralazine. Maintain K > 4.0 and Mg > 2.  Dehydrated according to renal function. Would be very cautious with IV fluids given her HF with preserved ejection fraction as she is very fluid sensitive.   Thank you for this consult. Will follow up in the AM. Please call with questions.  Signed, Elias Else, Klarissa Mcilvain C. MD 09/05/2013, 12:17 AM

## 2013-09-05 NOTE — Progress Notes (Addendum)
Patient ID: Renee Donovan, female   DOB: 12/23/1930, 80 y.o.   MRN: [id number] [facility] PROGRESS NOTE  SYVILLA MARTIN TGG:[id number] DOB: 02/05/1931 DOA: 09/04/2013 PCP: [name], MD  Brief narrative: Addendum to admission note done 09/05/2013 80 year old female with past medical history of COPD on 2 L home oxygen, osteoarthritis, from SNF who presented to [facility] ED 09/04/2013 with worsening nausea, vomiting, diarrhea and associated lower abdominal pain for past 5 days prior to this admission. On admission, patient was found to have possible perforated appendicitis for which surgery was consulted. In addition, patient had about 10 minute episode of wide complex tachycardia which has resolved with a bolus of amiodarone. Blood work was significant for leukocytosis of 14.7 and creatinine of 3.05. Cardiac enzymes were WNL.  Assessment and Plan:  Principal Problem:   Acute perforated appendicitis  CT abdomen on admission showed probable acute versus possibly early perforated appendicitis as evidenced by 2.9 x 2.3 cm inflammatory mass adjacent to the cecum. No abscess. Also seen is possible low grade small bowel obstruction or ileus from adjacent appendicitis. WBC trending down and pt feeling better this AM.   Per surgery this is likely subacute appendicitis versus cecal diverticulitis.  Appreciate surgery consult and their recommendations. Continue non-operative management with bowel rest and IV abx.  Will continue Zosyn for g- coverage and flagyl for C. Diff and atypical bacterial coverage   Active Problems:   COPD (chronic obstructive pulmonary disease) with emphysema / Acute respiratory failure with hypoxia  Stable, continue oxygen support via nasal canula to keep O2 saturation above 90%  Continue spiriva   Acute on chronic kidney disease, stage 4  Most recent baseline creatinine around 1.8 and on this admission 3.05; likely in the setting of acute perforated appendicitis.  She was also on lisinopril for blood pressure which could have exacerbated renal failure. Lisinopril is on hold.  We will continue IV fluids, normal saline @ 75 cc/hr  Check BMP in am   Nausea, vomiting, diarrhea  C. Diff positive  Lipase level was WNL on this admission  Place on Flagyl 500 mg IV TID   Continue  supportive care with IV fluids. antibiotics   Diastolic CHF, chronic  Last 2 D ECHO was in 06/2013 with preserved EF and grade 2 diastolic dysfunction  Appreciate cardiology following    Ventricular tachycardia, sustained  Likely in the setting of acute illness or metabolic derangements; cardiac enzymes negative x 2 sets  Recent ECHO in 06/2013 showed preserved EF with grade 2 diastolic dysfunction.  Currently on metoprolol but may require amiodarone if breakthrough events.   Hypertension  Continue catapres 0.2 mg weekly, Imdur 120 mg daily and metoprolol 25 mg PO BID  Consultants:  Cardiology (Dr. Burt Knack)  Surgery   Procedures/Studies: Ct Abdomen Pelvis Wo Contras  09/05/2013   IMPRESSION: Probable acute versus possibly early perforated appendicitis as evidenced by 2.9 x 2.3 cm inflammatory mass adjacent to the cecum. No abscess or pneumoperitoneum.  Low-grade partial small bowel obstruction or, ileus from adjacent appendiceal process.  Bilaterals cystic adnexal masses, smaller on the left which would be atypical for neoplasm. Recommend follow-up ultrasound for further characterization.  Small right pleural effusion with right lower lobe airspace opacity which may reflect atelectasis, less likely early pneumonia.  Severe aortic atherosclerosis was similar multi segmental ectasia.   Dg Chest Portable 1 View  09/04/2013  IMPRESSION: No acute abnormality.  Probable emphysema.     Antibiotics:  Zosyn 09/04/2013 -->  Flagyl 09/05/2013 -->  Code Status: DNR/DNI Family Communication: Updated daughter in law Alta at bedside, phone number [phone]  Disposition Plan:  Remains stable, transfer to tele    HPI/Subjective: No events overnight.   Objective: Filed Vitals:   09/05/13 0157 09/05/13 0206 09/05/13 0417 09/05/13 0800  BP:   146/74 152/45  Pulse:   88   Temp:   97.5 F (36.4 C)   TempSrc:   Oral   Resp:   15 22  Height: 5\' 6"  (1.676 m)     Weight: 64.2 kg (141 lb 8.6 oz)     SpO2:  96% 96% 99%    Intake/Output Summary (Last 24 hours) at 09/05/13 0807 Last data filed at 09/05/13 0800  Gross per 24 hour  Intake    675 ml  Output      0 ml  Net    675 ml    Exam:   General:  Pt is alert, no acute distress  Cardiovascular: Regular rate and rhythm, S1/S2, no murmurs, no rubs, no gallops  Respiratory: Clear to auscultation bilaterally, no wheezing, diminished breath sounds at bases  Abdomen: diffusely tender, non distended, bowel sounds present, no guarding  Extremities: No edema, pulses DP and PT palpable bilaterally  Neuro: No focal neurologic deficits   Data Reviewed: Basic Metabolic Panel:  Recent Labs Lab 09/04/13 1953  NA 138  K 3.7  CL 95*  CO2 24  GLUCOSE 134*  BUN 42*  CREATININE 3.05*  CALCIUM 9.0  MG 1.7   Liver Function Tests:  Recent Labs Lab 09/04/13 1953  AST 17  ALT 7  ALKPHOS 75  BILITOT 0.7  PROT 6.8  ALBUMIN 2.5*    Recent Labs Lab 09/04/13 1953  LIPASE 16   CBC:  Recent Labs Lab 09/04/13 1953 09/05/13 0225  WBC 14.7* 12.7*  NEUTROABS 11.0*  --   HGB 13.5 13.2  HCT 40.4 39.5  MCV 89.8 90.2  PLT 510* 448*   Cardiac Enzymes:  Recent Labs Lab 09/04/13 1953 09/05/13 0225  TROPONINI <0.30 <0.30    MRSA PCR SCREENING     Status: None   Collection Time    09/05/13  1:48 AM      Result Value Ref Range Status   MRSA by PCR NEGATIVE  NEGATIVE Final     Scheduled Meds: . albuterol  2.5 mg Nebulization BID  .  cloNIDine  0.2 mg Transdermal Weekly  . isosorbide mononitrate  120 mg Oral Daily  . metoprolol tartrate  25 mg Oral BID  . piperacillin-tazobactam   2.25 g  Intravenous Q6H  . tiotropium  18 mcg Inhalation Daily   Continuous Infusions: . sodium chloride 75 mL/hr at 09/05/13 0231     Faye Ramsay, MD  [facility] Pager [phone]  If 7PM-7AM, please contact night-coverage [email or web address] Password [email or web address] 09/05/2013, 8:07 AM   LOS: 1 day

## 2013-09-05 NOTE — Consult Note (Signed)
Reason for Consult:  possible appendicitis, SBO  Referring Physician: Alvino Chapel EDP  Renee Donovan is an 80 y.o. female.  HPI: patient is an 80 year old female who presents to the emergency room with a one to 2 week illness. History is obtained from the patient and she is a somewhat vague historian. She states she lives alone with some help from her daughter-in-law. There is no family here at present. She says that for at least a week she has had persistent diarrhea several times per day. Also frequent nausea and vomiting and she has been unable to  Eat or drink. On questioning she has been having some lower abdominal pain although this has not been severe and she really cannot characterize the pain. Denies fever or chills. She has been weak and fatigued. During her evaluation here in the emergency department she developed ventricular tachycardia that responded to a bolus of amiodarone.  Past Medical History  Diagnosis Date  . Hyperlipidemia   . Hypertension   . DDD (degenerative disc disease)   . DJD (degenerative joint disease)     ankles  . Cluster headache     remote  . COPD (chronic obstructive pulmonary disease)     Golds Stage II feV1 63% 11/2009  . Thyroid disorder   . Breast cancer 1991    s/p R lumpectomy and radiation     Past Surgical History  Procedure Laterality Date  . Nose surgery  1993  . Chin lift  [id number]  . Toe surgery  1965    5th toe- right foot  . Breast lumpectomy  1991    Right breast    Family History  Problem Relation Age of Onset  . Coronary artery disease Father   . Heart attack Father   . Diabetes Father   . Breast cancer Mother     Social History:  reports that she has quit smoking. Her smoking use included Cigarettes. She has a 30 pack-year smoking history. She has never used smokeless tobacco. She reports that she drinks alcohol. She reports that she does not use illicit drugs.  Allergies: No Known Allergies  Current Facility-Administered  Medications  Medication Dose Route Frequency Provider Last Rate Last Dose  . piperacillin-tazobactam (ZOSYN) IVPB 3.375 g  3.375 g Intravenous Once Ovid Curd R. Pickering, MD 100 mL/hr at 09/05/13 0056 3.375 g at 09/05/13 0056   Current Outpatient Prescriptions  Medication Sig Dispense Refill  . albuterol (PROVENTIL HFA;VENTOLIN HFA) 108 (90 BASE) MCG/ACT inhaler Inhale 1-2 puffs into the lungs every 6 (six) hours as needed for wheezing or shortness of breath.      Marland Kitchen albuterol (PROVENTIL) (2.5 MG/3ML) 0.083% nebulizer solution Take 2.5 mg by nebulization 2 (two) times daily.       Marland Kitchen albuterol (PROVENTIL) (2.5 MG/3ML) 0.083% nebulizer solution Take 2.5 mg by nebulization every 4 (four) hours as needed for wheezing or shortness of breath.      Marland Kitchen buPROPion (WELLBUTRIN XL) 300 MG 24 hr tablet Take 1 tablet (300 mg total) by mouth daily.  30 tablet  5  . cloNIDine (CATAPRES) 0.1 MG tablet Take 0.1 mg by mouth 2 (two) times daily.      . furosemide (LASIX) 20 MG tablet Take 1 tablet (20 mg total) by mouth daily. Start on 07/31/13  30 tablet  1  . levothyroxine (SYNTHROID, LEVOTHROID) 50 MCG tablet Take 1 tablet (50 mcg total) by mouth daily.  30 tablet  11  . lisinopril-hydrochlorothiazide (PRINZIDE,ZESTORETIC) 20-12.5 MG per tablet  Take 1 tablet by mouth daily.      Marland Kitchen lovastatin (MEVACOR) 40 MG tablet Take 40 mg by mouth at bedtime.      . nicotine (NICODERM CQ - DOSED IN MG/24 HOURS) 14 mg/24hr patch Place 14 mg onto the skin daily.      . NON FORMULARY Take 1,200 mLs by mouth 3 (three) times daily. Medpass      . ondansetron (ZOFRAN) 8 MG tablet Take 1 tablet (8 mg total) by mouth every 8 (eight) hours as needed for nausea or vomiting.  12 tablet  0  . potassium chloride SA (K-DUR,KLOR-CON) 20 MEQ tablet Take 20 mEq by mouth daily.      Marland Kitchen tiotropium (SPIRIVA HANDIHALER) 18 MCG inhalation capsule Place 1 capsule (18 mcg total) into inhaler and inhale daily.  30 capsule  12  . verapamil (CALAN) 120 MG  tablet Take 240 mg by mouth 2 (two) times daily.      . Vitamin D, Ergocalciferol, (DRISDOL) 50000 UNITS CAPS capsule Take 50,000 Units by mouth every 7 (seven) days.      Marland Kitchen aspirin 81 MG chewable tablet Chew 1 tablet (81 mg total) by mouth daily.  30 tablet  1  . atorvastatin (LIPITOR) 10 MG tablet Take 1 tablet (10 mg total) by mouth daily at 6 PM.  30 tablet  1  . cephALEXin (KEFLEX) 500 MG capsule Take 1 capsule (500 mg total) by mouth 4 (four) times daily.  28 capsule  0  . cloNIDine (CATAPRES - DOSED IN MG/24 HR) 0.2 mg/24hr patch Place 1 patch (0.2 mg total) onto the skin once a week. Change every Monday  4 patch  12  . docusate sodium 100 MG CAPS Take 100 mg by mouth 2 (two) times daily.  10 capsule  0  . hydrALAZINE (APRESOLINE) 100 MG tablet Take 1 tablet (100 mg total) by mouth every 8 (eight) hours.  90 tablet  1  . isosorbide mononitrate (IMDUR) 120 MG 24 hr tablet Take 1 tablet (120 mg total) by mouth daily.  30 tablet  1  . metoprolol tartrate (LOPRESSOR) 12.5 mg TABS tablet Take 0.5 tablets (12.5 mg total) by mouth 2 (two) times daily.  60 tablet  1  . senna (SENOKOT) 8.6 MG TABS tablet Take 2 tablets (17.2 mg total) by mouth daily.  120 each  0  . sodium chloride (OCEAN) 0.65 % SOLN nasal spray Place 1 spray into both nostrils as needed for congestion.  15 mL  0     Results for orders placed during the hospital encounter of 09/04/13 (from the past 48 hour(s))  CBC WITH DIFFERENTIAL     Status: Abnormal   Collection Time    09/04/13  7:53 PM      Result Value Ref Range   WBC 14.7 (*) 4.0 - 10.5 K/uL   RBC 4.50  3.87 - 5.11 MIL/uL   Hemoglobin 13.5  12.0 - 15.0 g/dL   HCT 40.4  36.0 - 46.0 %   MCV 89.8  78.0 - 100.0 fL   MCH 30.0  26.0 - 34.0 pg   MCHC 33.4  30.0 - 36.0 g/dL   RDW 13.9  11.5 - 15.5 %   Platelets 510 (*) 150 - 400 K/uL   Neutrophils Relative % 75  43 - 77 %   Neutro Abs 11.0 (*) 1.7 - 7.7 K/uL   Lymphocytes Relative 11 (*) 12 - 46 %   Lymphs Abs 1.6  0.7  -  4.0 K/uL   Monocytes Relative 13 (*) 3 - 12 %   Monocytes Absolute 1.9 (*) 0.1 - 1.0 K/uL   Eosinophils Relative 1  0 - 5 %   Eosinophils Absolute 0.2  0.0 - 0.7 K/uL   Basophils Relative 0  0 - 1 %   Basophils Absolute 0.0  0.0 - 0.1 K/uL  COMPREHENSIVE METABOLIC PANEL     Status: Abnormal   Collection Time    09/04/13  7:53 PM      Result Value Ref Range   Sodium 138  137 - 147 mEq/L   Potassium 3.7  3.7 - 5.3 mEq/L   Chloride 95 (*) 96 - 112 mEq/L   CO2 24  19 - 32 mEq/L   Glucose, Bld 134 (*) 70 - 99 mg/dL   BUN 42 (*) 6 - 23 mg/dL   Creatinine, Ser 3.05 (*) 0.50 - 1.10 mg/dL   Calcium 9.0  8.4 - 10.5 mg/dL   Total Protein 6.8  6.0 - 8.3 g/dL   Albumin 2.5 (*) 3.5 - 5.2 g/dL   AST 17  0 - 37 U/L   ALT 7  0 - 35 U/L   Alkaline Phosphatase 75  39 - 117 U/L   Total Bilirubin 0.7  0.3 - 1.2 mg/dL   GFR calc non Af Amer 13 (*) >90 mL/min   GFR calc Af Amer 15 (*) >90 mL/min   Comment: (NOTE)     The eGFR has been calculated using the CKD EPI equation.     This calculation has not been validated in all clinical situations.     eGFR's persistently <90 mL/min signify possible Chronic Kidney     Disease.  LIPASE, BLOOD     Status: None   Collection Time    09/04/13  7:53 PM      Result Value Ref Range   Lipase 16  11 - 59 U/L  TROPONIN I     Status: None   Collection Time    09/04/13  7:53 PM      Result Value Ref Range   Troponin I <0.30  <0.30 ng/mL   Comment:            Due to the release kinetics of cTnI,     a negative result within the first hours     of the onset of symptoms does not rule out     myocardial infarction with certainty.     If myocardial infarction is still suspected,     repeat the test at appropriate intervals.  MAGNESIUM     Status: None   Collection Time    09/04/13  7:53 PM      Result Value Ref Range   Magnesium 1.7  1.5 - 2.5 mg/dL  URINALYSIS, ROUTINE W REFLEX MICROSCOPIC     Status: Abnormal   Collection Time    09/04/13 10:17 PM       Result Value Ref Range   Color, Urine AMBER (*) YELLOW   Comment: BIOCHEMICALS MAY BE AFFECTED BY COLOR   APPearance CLOUDY (*) CLEAR   Specific Gravity, Urine 1.017  1.005 - 1.030   pH 5.0  5.0 - 8.0   Glucose, UA NEGATIVE  NEGATIVE mg/dL   Hgb urine dipstick SMALL (*) NEGATIVE   Bilirubin Urine SMALL (*) NEGATIVE   Ketones, ur NEGATIVE  NEGATIVE mg/dL   Protein, ur NEGATIVE  NEGATIVE mg/dL   Urobilinogen, UA 0.2  0.0 - 1.0 mg/dL  Nitrite NEGATIVE  NEGATIVE   Leukocytes, UA NEGATIVE  NEGATIVE  URINE MICROSCOPIC-ADD ON     Status: Abnormal   Collection Time    09/04/13 10:17 PM      Result Value Ref Range   Squamous Epithelial / LPF RARE  RARE   RBC / HPF 0-2  <3 RBC/hpf   Casts HYALINE CASTS (*) NEGATIVE   Urine-Other AMORPHOUS URATES/PHOSPHATES      Ct Abdomen Pelvis Wo Contrast  09/05/2013   CLINICAL DATA:  Low abdominal pain.  EXAM: CT ABDOMEN AND PELVIS WITHOUT CONTRAST  TECHNIQUE: Multidetector CT imaging of the abdomen and pelvis was performed following the standard protocol without IV contrast.  COMPARISON:  CT of the abdomen and pelvis July 18, 2013.  FINDINGS: LUNG BASES: Included view of the lung bases demonstrate new small right pleural effusion with patchy airspace opacity. The visualized heart and pericardium are unchanged. At least 3.8 cm distal thoracic aortic ectasia, unchanged.  KIDNEYS/BLADDER: Kidneys are orthotopic, demonstrating normal size and morphology. No nephrolithiasis, hydronephrosis; limited assessment for renal masses on this nonenhanced examination. 17 mm left interpolar cyst similar The unopacified ureters are normal in course and caliber. Urinary bladder is partially distended and unremarkable.  SOLID ORGANS: The liver, spleen, pancreas and adrenal glands are unremarkable for this non-contrast examination. Multiple tiny cholelithiasis without CT findings of acute cholecystitis.  GASTROINTESTINAL TRACT: Small to moderate fluid filled hiatal hernia.  Distended small bowel to 3.8 cm with air-fluid levels and small bowel feces. No discrete transition point, gentle tapering may reflect ileus. 2.3 x 2.9 cm inflammatory mass adjacent to the cecum, axial 57/92. The mass appears contiguous with the appendix, and likely reflects acute or possibly perforated appendicitis. The stomach, and large bowel are normal in course and caliber without inflammatory changes, the sensitivity may be decreased by lack of enteric contrast. Colonic diverticulosis.  PERITONEUM/RETROPERITONEUM: No intraperitoneal free fluid nor free air. Aortoiliac vessels are normal in course and caliber, with moderate to severe calcific atherosclerosis. 2.7 cm ectatic infrarenal aorta. No lymphadenopathy by CT size criteria. The left adnexal cystic mass is greatly decreased in size from 8.1 x 8.4 cm to 4.1 x 0.8 cm. Slight increase in size of right adnexal mass, previously 5.4 x 3.4 cm, now 3.8 x 5.7 cm.  SOFT TISSUES/ OSSEOUS STRUCTURES: The soft tissues and included osseous structures are nonsuspicious. Multilevel severe degenerative disc disease.  IMPRESSION: Probable acute versus possibly early perforated appendicitis as evidenced by 2.9 x 2.3 cm inflammatory mass adjacent to the cecum. No abscess or pneumoperitoneum.  Low-grade partial small bowel obstruction or, ileus from adjacent appendiceal process.  Bilaterals cystic adnexal masses, smaller on the left which would be atypical for neoplasm. Recommend follow-up ultrasound for further characterization.  Small right pleural effusion with right lower lobe airspace opacity which may reflect atelectasis, less likely early pneumonia.  Severe aortic atherosclerosis was similar multi segmental ectasia.  Preliminary findings discussed with and reconfirmed by [facility] PICKERING on6/10/2015at12:12 AM.   Electronically Signed   By: Elon Alas   On: 09/05/2013 00:13   Dg Chest Portable 1 View  09/04/2013   CLINICAL DATA:  Ventricular tachycardia.   Vomiting.  EXAM: PORTABLE CHEST - 1 VIEW  COMPARISON:  07/25/2013  FINDINGS: Heart size and pulmonary vascularity are normal. Lungs are clear although somewhat hyperinflated suggesting emphysema. No acute osseous abnormality. Surgical clips of visible in the right axilla.  IMPRESSION: No acute abnormality.  Probable emphysema.   Electronically Signed   By: Clair Gulling  Maxwell M.D.   On: 09/04/2013 21:51    Review of Systems  Constitutional: Negative for fever and chills.  Respiratory: Positive for cough and shortness of breath.   Cardiovascular: Negative for chest pain and palpitations.  Gastrointestinal: Positive for nausea, vomiting, abdominal pain and diarrhea. Negative for constipation.   Blood pressure 165/89, pulse 101, temperature 97.8 F (36.6 C), temperature source Oral, resp. rate 22, SpO2 96.00%. Physical Exam General: Chronically ill fatigued appearing elderly Caucasian female in no acute distress Skin: Atrophic with multiple ecchymoses of her extremities. HEENT: No palpable masses. Sclera nonicteric. Breasts: Status post right lumpectomy. No palpable masses. Lymph nodes: No cervical, supraclavicular or inguinal nodes palpable Lungs: Clear breath sounds bilaterally without increased work of breathing Cardiac: Regular rate and rhythm. No edema. Peripheral pulses intact. Abdomen: Mild to moderate distention. Bowel sounds are present. There is mild right lower quadrant tenderness but no guarding or peritoneal signs. No discernible masses or organomegaly. No palpable hernias. Extremities: thin and atrophic. No edema or cyanosis. Small scattered eschar over the dorsum of her toes but palpable pedal pulses Neurologic: She is alert and oriented to person,place and situation. No gross motor deficits.  Assessment/Plan: 80 year old female with significant medical problems including COPD who presents with at least one week illness of diarrhea nausea and vomiting. CT scan shows an inflammatory mass  lateral to the cecum with possible partial small bowel obstruction versus ileus. This is consistent with a subacute appendicitis or possibly cecal diverticulitis. She has acute renal insufficiency probably secondary to severe dehydration and has had an episode of ventricular tachycardia tonight in the emergency department. I would recommend that initially the patient be admitted and stabilized with IV fluids and start broad-spectrum IV antibiotics. Place nasogastric tube. Cardiology consult is in progress. She does not have peritonitis  or other need to proceed with emergency surgery tonight. If she were to improve with conservative management would possibly consider nonoperative treatment. If she continues to have evidence of bowel obstruction she would need laparotomy and probably would require ileocecectomy.  She would be an improved surgical risk after IV hydration and evaluation and stabilization of her cardiac issues.  Lucelia Lacey T 09/05/2013, 1:12 AM

## 2013-09-05 NOTE — Progress Notes (Signed)
    Subjective:  No complaints this am. Denies dyspnea or chest pain. No abdominal pain.  Objective:  Vital Signs in the last 24 hours: Temp:  [97.5 F (36.4 C)-97.8 F (36.6 C)] 97.5 F (36.4 C) (06/11 0417) Pulse Rate:  [88-104] 88 (06/11 0417) Resp:  [15-28] 15 (06/11 0417) BP: (108-185)/(70-129) 146/74 mmHg (06/11 0417) SpO2:  [86 %-97 %] 96 % (06/11 0417) Weight:  [64.2 kg (141 lb 8.6 oz)] 64.2 kg (141 lb 8.6 oz) (06/11 0157)  Intake/Output from previous day: 06/10 0701 - 06/11 0700 In: 400 [I.V.:150; IV Piggyback:250] Out: -   Physical Exam: Pt is alert and oriented, elderly woman in NAD HEENT: normal Neck: JVP - normal Lungs: CTA bilaterally CV: RRR without murmur or gallop Abd: soft, NT, Positive BS, no hepatomegaly Ext: no C/C/E, distal pulses intact and equal Skin: warm/dry no rash   Lab Results:  Recent Labs  09/04/13 1953 09/05/13 0225  WBC 14.7* 12.7*  HGB 13.5 13.2  PLT 510* 448*    Recent Labs  09/04/13 1953  NA 138  K 3.7  CL 95*  CO2 24  GLUCOSE 134*  BUN 42*  CREATININE 3.05*    Recent Labs  09/04/13 1953 09/05/13 0225  TROPONINI <0.30 <0.30    Tele: Sinus rhythm  Assessment/Plan:  1. Wide Complex Tachycardia 2. HFpEF 3. HTN 4. COPD 5. Acute Kidney Injury  Pt previously with normal LV function (echo from April 2015). Now with wide-complex tach in setting acute illness, kidney injury, etc. Trop negative x 2. She is undergoing surgical eval for partial SBO/inflammatory process. Will repeat echo to eval for acute change in LV function as potential cause of VT. Suspect related to her acute illness and possible metabolic derangements. Discussed with Dr Rayann Heman and difficulty to determine SVT with abberancy versus VT. Will continue metoprolol and if she has breakthrough events will add amiodarone.   Sherren Mocha, M.D. 09/05/2013, 7:45 AM

## 2013-09-05 NOTE — Progress Notes (Signed)
When pt arrived to unit from ICU, BP was checked x3 and each time systolic was in the 18D. MD made aware, order to give 500cc bolus and recheck. Will continue to monitor. Callie Fielding RN

## 2013-09-05 NOTE — Progress Notes (Signed)
INITIAL NUTRITION ASSESSMENT  DOCUMENTATION CODES Per approved criteria  -Severe malnutrition in the context of chronic illness   INTERVENTION: Consider nutrition support of unable to advance diet. RD to monitor.  NUTRITION DIAGNOSIS: Inadequate oral intake related to altered gi function as evidenced by npo status..   Goal: Diet advancement with intake of meals and supplements to meet >90% estimated needs.  Monitor:  Diet advancement, labs, weight trend.  Reason for Assessment: Low Braden  80 y.o. female  Admitting Dx: Acute perforated appendicitis 80 year old female with past medical history of COPD on 2 L home oxygen, osteoarthritis, from SNF who presented to [facility] ED 09/04/2013 with worsening nausea, vomiting, diarrhea and associated lower abdominal pain for past 5 days prior to this admission. On admission, patient was found to have possible perforated appendicitis for which surgery was consulted. In addition, patient had about 10 minute episode of wide complex tachycardia which has resolved with a bolus of amiodarone. Blood work was significant for leukocytosis of 14.7 and creatinine of 3.05. Cardiac enzymes were WNL.  Acute on chronic kidney disease, stage 4.  ASSESSMENT: 6/11:  Positive C. Diff.  Skin is crepe like, black on toes and heals and pink on hip.  Currently NPO for non-operative management of perforated appendicitis vs cecal diverticulitis.- Bowel rest and IV antibiotics.  Patient reports poor intake for the last 2 months.  Per e-chart, weight was 176 lbs 07/29/13.  Loss of 31 lbs in the past month.  Expect loss of fluid muscle mass and body fat.  Basic observation of physical status noted.  Patient meets criteria for severe malnutrition related to chronic illness AEB 19% weight loss in the last month, intake less than 75% for >1 month, and decreased body fat and muscle mass.  Height: Ht Readings from Last 1 Encounters:  09/05/13 5\' 6"  (1.676 m)    Weight: Wt Readings  from Last 1 Encounters:  09/05/13 141 lb 8.6 oz (64.2 kg)    Ideal Body Weight: 130 lbs  % Ideal Body Weight: 108  Wt Readings from Last 10 Encounters:  09/05/13 141 lb 8.6 oz (64.2 kg)  07/29/13 176 lb 9.4 oz (80.1 kg)  07/09/12 164 lb (74.39 kg)  05/15/12 162 lb (73.483 kg)  03/18/12 194 lb 14.2 oz (88.4 kg)  07/21/11 188 lb (85.276 kg)  12/07/10 180 lb 3.2 oz (81.738 kg)  10/06/10 183 lb 6.4 oz (83.19 kg)  01/20/10 186 lb 4 oz (84.482 kg)  01/01/10 185 lb (83.915 kg)    Usual Body Weight: 176  % Usual Body Weight: 81  BMI:  Body mass index is 22.86 kg/(m^2).  Estimated Nutritional Needs: Kcal: 1500-1600 Protein: 70-80 gm protein Fluid: 1.5L daily  Skin: crepe like, several black spots on toes and heal, pink on hip.  Diet Order: NPO  EDUCATION NEEDS: -Education not appropriate at this time   Intake/Output Summary (Last 24 hours) at 09/05/13 1018 Last data filed at 09/05/13 0800  Gross per 24 hour  Intake    675 ml  Output      0 ml  Net    675 ml    Last BM: today   Labs:   Recent Labs Lab 09/04/13 1953 09/05/13 0751  NA 138 140  K 3.7 3.5*  CL 95* 98  CO2 24 26  BUN 42* 44*  CREATININE 3.05* 3.00*  CALCIUM 9.0 8.0*  MG 1.7  --   GLUCOSE 134* 103*    CBG (last 3)  No  results found for this basename: GLUCAP,  in the last 72 hours  Scheduled Meds: . albuterol  2.5 mg Nebulization BID  . antiseptic oral rinse  15 mL Mouth Rinse q12n4p  . chlorhexidine  15 mL Mouth Rinse BID  . [START ON 09/09/2013] cloNIDine  0.2 mg Transdermal Weekly  . isosorbide mononitrate  120 mg Oral Daily  . metoprolol tartrate  25 mg Oral BID  . metronidazole  500 mg Intravenous 3 times per day  . piperacillin-tazobactam (ZOSYN)  IV  2.25 g Intravenous Q6H  . tiotropium  18 mcg Inhalation Daily  . [DISCONTINUED] sodium chloride   Intravenous STAT    Continuous Infusions: . sodium chloride      Past Medical History  Diagnosis Date  . Hyperlipidemia   .  Hypertension   . DDD (degenerative disc disease)   . DJD (degenerative joint disease)     ankles  . Cluster headache     remote  . COPD (chronic obstructive pulmonary disease)     Golds Stage II feV1 63% 11/2009  . Thyroid disorder   . Breast cancer 1991    s/p R lumpectomy and radiation     Past Surgical History  Procedure Laterality Date  . Nose surgery  1993  . Chin lift  [id number]  . Toe surgery  1965    5th toe- right foot  . Breast lumpectomy  1991    Right breast    Renee Donovan, RD, LDN Clinical Inpatient Dietitian Pager:  [phone] Weekend and after hours pager:  [phone]

## 2013-09-05 NOTE — H&P (Signed)
PCP:   [name], MD   Chief Complaint:  n/v  HPI: 80 yo female h/o oxgyen dep copd 2 L cont at SNF, htn, hld comes in with over 5 days of worsening n/v and abdominal pain.  Pain reported mostly in the lower quadrants.  Pt has had some iv pain meds, and is a little confused now.  Her baseline mental status is normal, but now is sedated mildly confused.  She denies any pain now.  And says she came to ED for her breathing.  Per report from EDP and ED RN she was vomiting nonbloody and having abd pain.  While in the ED however she had about ten minutes of wide complex tachycardia thought to be vtach which resolved after given a bolus of amiodorone, during this arrythmia, she did become less responsive and her bp remained stable.  She appeared to have returned to her baseline after the amiodorone.  Her ct shows ?perforated appendix, we are asked to admit due to her other active medical issues which will likely delay any immediate surgical intervention at this time.  Review of Systems:  Positive and negative as per HPI otherwise all other systems are negative but highly question reliability  Past Medical History: Past Medical History  Diagnosis Date  . Hyperlipidemia   . Hypertension   . DDD (degenerative disc disease)   . DJD (degenerative joint disease)     ankles  . Cluster headache     remote  . COPD (chronic obstructive pulmonary disease)     Golds Stage II feV1 63% 11/2009  . Thyroid disorder   . Breast cancer 1991    s/p R lumpectomy and radiation    Past Surgical History  Procedure Laterality Date  . Nose surgery  1993  . Chin lift  [id number]  . Toe surgery  1965    5th toe- right foot  . Breast lumpectomy  1991    Right breast    Medications: Prior to Admission medications   Medication Sig Start Date End Date Taking? Authorizing Provider  albuterol (PROVENTIL HFA;VENTOLIN HFA) 108 (90 BASE) MCG/ACT inhaler Inhale 1-2 puffs into the lungs every 6 (six) hours as needed for  wheezing or shortness of breath.   Yes Historical Provider, MD  albuterol (PROVENTIL) (2.5 MG/3ML) 0.083% nebulizer solution Take 2.5 mg by nebulization 2 (two) times daily.    Yes Historical Provider, MD  albuterol (PROVENTIL) (2.5 MG/3ML) 0.083% nebulizer solution Take 2.5 mg by nebulization every 4 (four) hours as needed for wheezing or shortness of breath.   Yes Historical Provider, MD  buPROPion (WELLBUTRIN XL) 300 MG 24 hr tablet Take 1 tablet (300 mg total) by mouth daily. 01/02/13  Yes Neena Rhymes, MD  cloNIDine (CATAPRES) 0.1 MG tablet Take 0.1 mg by mouth 2 (two) times daily.   Yes Historical Provider, MD  furosemide (LASIX) 20 MG tablet Take 1 tablet (20 mg total) by mouth daily. Start on 07/31/13 07/29/13  Yes Orson Eva, MD  levothyroxine (SYNTHROID, LEVOTHROID) 50 MCG tablet Take 1 tablet (50 mcg total) by mouth daily. 07/09/12  Yes Neena Rhymes, MD  lisinopril-hydrochlorothiazide (PRINZIDE,ZESTORETIC) 20-12.5 MG per tablet Take 1 tablet by mouth daily.   Yes Historical Provider, MD  lovastatin (MEVACOR) 40 MG tablet Take 40 mg by mouth at bedtime.   Yes Historical Provider, MD  nicotine (NICODERM CQ - DOSED IN MG/24 HOURS) 14 mg/24hr patch Place 14 mg onto the skin daily.   Yes Historical Provider, MD  NON FORMULARY Take 1,200 mLs by mouth 3 (three) times daily. Medpass   Yes Historical Provider, MD  ondansetron (ZOFRAN) 8 MG tablet Take 1 tablet (8 mg total) by mouth every 8 (eight) hours as needed for nausea or vomiting. 07/18/13  Yes Mirna Mires, MD  potassium chloride SA (K-DUR,KLOR-CON) 20 MEQ tablet Take 20 mEq by mouth daily.   Yes Historical Provider, MD  tiotropium (SPIRIVA HANDIHALER) 18 MCG inhalation capsule Place 1 capsule (18 mcg total) into inhaler and inhale daily. 01/02/13  Yes Neena Rhymes, MD  verapamil (CALAN) 120 MG tablet Take 240 mg by mouth 2 (two) times daily.   Yes Historical Provider, MD  Vitamin D, Ergocalciferol, (DRISDOL) 50000 UNITS CAPS capsule  Take 50,000 Units by mouth every 7 (seven) days.   Yes Historical Provider, MD  aspirin 81 MG chewable tablet Chew 1 tablet (81 mg total) by mouth daily. 07/29/13   Orson Eva, MD  atorvastatin (LIPITOR) 10 MG tablet Take 1 tablet (10 mg total) by mouth daily at 6 PM. 07/29/13   Orson Eva, MD  cephALEXin (KEFLEX) 500 MG capsule Take 1 capsule (500 mg total) by mouth 4 (four) times daily. 07/18/13   Mirna Mires, MD  cloNIDine (CATAPRES - DOSED IN MG/24 HR) 0.2 mg/24hr patch Place 1 patch (0.2 mg total) onto the skin once a week. Change every Monday 07/29/13   Orson Eva, MD  docusate sodium 100 MG CAPS Take 100 mg by mouth 2 (two) times daily. 07/29/13   Orson Eva, MD  hydrALAZINE (APRESOLINE) 100 MG tablet Take 1 tablet (100 mg total) by mouth every 8 (eight) hours. 07/29/13   Orson Eva, MD  isosorbide mononitrate (IMDUR) 120 MG 24 hr tablet Take 1 tablet (120 mg total) by mouth daily. 07/29/13   Orson Eva, MD  metoprolol tartrate (LOPRESSOR) 12.5 mg TABS tablet Take 0.5 tablets (12.5 mg total) by mouth 2 (two) times daily. 07/29/13   Orson Eva, MD  senna (SENOKOT) 8.6 MG TABS tablet Take 2 tablets (17.2 mg total) by mouth daily. 07/29/13   Orson Eva, MD  sodium chloride (OCEAN) 0.65 % SOLN nasal spray Place 1 spray into both nostrils as needed for congestion. 07/29/13   Orson Eva, MD    Allergies:  No Known Allergies  Social History:  reports that she has quit smoking. Her smoking use included Cigarettes. She has a 30 pack-year smoking history. She has never used smokeless tobacco. She reports that she drinks alcohol. She reports that she does not use illicit drugs.  Family History: Family History  Problem Relation Age of Onset  . Coronary artery disease Father   . Heart attack Father   . Diabetes Father   . Breast cancer Mother     Physical Exam: Filed Vitals:   09/04/13 2132 09/04/13 2224 09/04/13 2230 09/04/13 2300  BP: 110/70 137/83 108/75 121/84  Pulse:  97 98   Temp:      TempSrc:      Resp:   25 24 24   SpO2:  95% 94%    General appearance: alert, cooperative and no distress Head: Normocephalic, without obvious abnormality, atraumatic Eyes: negative Nose: Nares normal. Septum midline. Mucosa normal. No drainage or sinus tenderness. Neck: no JVD and supple, symmetrical, trachea midline Lungs: clear to auscultation bilaterally Heart: regular rate and rhythm, S1, S2 normal, no murmur, click, rub or gallop Abdomen: soft, non-tender; bowel sounds normal; no masses,  no organomegaly Extremities: extremities normal, atraumatic, no cyanosis or edema  Pulses: 2+ and symmetric Skin: Skin color, texture, turgor normal. No rashes or lesions Neurologic: Grossly normal  Mild confusion  Labs on Admission:   Recent Labs  09/04/13 1953  NA 138  K 3.7  CL 95*  CO2 24  GLUCOSE 134*  BUN 42*  CREATININE 3.05*  CALCIUM 9.0  MG 1.7    Recent Labs  09/04/13 1953  AST 17  ALT 7  ALKPHOS 75  BILITOT 0.7  PROT 6.8  ALBUMIN 2.5*    Recent Labs  09/04/13 1953  LIPASE 16    Recent Labs  09/04/13 1953  WBC 14.7*  NEUTROABS 11.0*  HGB 13.5  HCT 40.4  MCV 89.8  PLT 510*    Recent Labs  09/04/13 1953  TROPONINI <0.30   Radiological Exams on Admission: Ct Abdomen Pelvis Wo Contrast  09/05/2013   CLINICAL DATA:  Low abdominal pain.  EXAM: CT ABDOMEN AND PELVIS WITHOUT CONTRAST  TECHNIQUE: Multidetector CT imaging of the abdomen and pelvis was performed following the standard protocol without IV contrast.  COMPARISON:  CT of the abdomen and pelvis July 18, 2013.  FINDINGS: LUNG BASES: Included view of the lung bases demonstrate new small right pleural effusion with patchy airspace opacity. The visualized heart and pericardium are unchanged. At least 3.8 cm distal thoracic aortic ectasia, unchanged.  KIDNEYS/BLADDER: Kidneys are orthotopic, demonstrating normal size and morphology. No nephrolithiasis, hydronephrosis; limited assessment for renal masses on this nonenhanced  examination. 17 mm left interpolar cyst similar The unopacified ureters are normal in course and caliber. Urinary bladder is partially distended and unremarkable.  SOLID ORGANS: The liver, spleen, pancreas and adrenal glands are unremarkable for this non-contrast examination. Multiple tiny cholelithiasis without CT findings of acute cholecystitis.  GASTROINTESTINAL TRACT: Small to moderate fluid filled hiatal hernia. Distended small bowel to 3.8 cm with air-fluid levels and small bowel feces. No discrete transition point, gentle tapering may reflect ileus. 2.3 x 2.9 cm inflammatory mass adjacent to the cecum, axial 57/92. The mass appears contiguous with the appendix, and likely reflects acute or possibly perforated appendicitis. The stomach, and large bowel are normal in course and caliber without inflammatory changes, the sensitivity may be decreased by lack of enteric contrast. Colonic diverticulosis.  PERITONEUM/RETROPERITONEUM: No intraperitoneal free fluid nor free air. Aortoiliac vessels are normal in course and caliber, with moderate to severe calcific atherosclerosis. 2.7 cm ectatic infrarenal aorta. No lymphadenopathy by CT size criteria. The left adnexal cystic mass is greatly decreased in size from 8.1 x 8.4 cm to 4.1 x 0.8 cm. Slight increase in size of right adnexal mass, previously 5.4 x 3.4 cm, now 3.8 x 5.7 cm.  SOFT TISSUES/ OSSEOUS STRUCTURES: The soft tissues and included osseous structures are nonsuspicious. Multilevel severe degenerative disc disease.  IMPRESSION: Probable acute versus possibly early perforated appendicitis as evidenced by 2.9 x 2.3 cm inflammatory mass adjacent to the cecum. No abscess or pneumoperitoneum.  Low-grade partial small bowel obstruction or, ileus from adjacent appendiceal process.  Bilaterals cystic adnexal masses, smaller on the left which would be atypical for neoplasm. Recommend follow-up ultrasound for further characterization.  Small right pleural effusion  with right lower lobe airspace opacity which may reflect atelectasis, less likely early pneumonia.  Severe aortic atherosclerosis was similar multi segmental ectasia.  Preliminary findings discussed with and reconfirmed by [facility] on6/10/2015at12:12 AM.   Electronically Signed   By: Elon Alas   On: 09/05/2013 00:13   Dg Chest Portable 1 View  09/04/2013  CLINICAL DATA:  Ventricular tachycardia.  Vomiting.  EXAM: PORTABLE CHEST - 1 VIEW  COMPARISON:  07/25/2013  FINDINGS: Heart size and pulmonary vascularity are normal. Lungs are clear although somewhat hyperinflated suggesting emphysema. No acute osseous abnormality. Surgical clips of visible in the right axilla.  IMPRESSION: No acute abnormality.  Probable emphysema.   Electronically Signed   By: Rozetta Nunnery M.D.   On: 09/04/2013 21:51    Assessment/Plan  80 yo female with n/v abd pain with acute intraabdominal infection appendicitis with possible perforation and acute renal failure, with episode of wide complex tachycardia resolved with amio bolus  Principal Problem:   Acute perforated appendicitis-  Place on zosyn.  gen surgery to see.  Her abd exam is quite unimpressive considering her ct scan results, quite benign exam.  Keep npo x meds.  Will need to have adequate fluid resusciatation and would not think she needs any emergent surgery tonight.  Active Problems:  Stable unless o/w noted   COPD (chronic obstructive pulmonary disease) with emphysema   Nausea and vomiting   ARF (acute renal failure)-  Prerenal +/- atn.  Ivf.  Give another bolus (has received only one liter thus far), repeat cr in am.  Hold diuiretics.  Ck lactic acid level.   Diastolic CHF, chronic-     Chronic respiratory failure with hypoxia   Ventricular tachycardia, sustained-  Cardiology evaluation underway, but think this is more rate related LBBB not vtach.   Abdominal pain, acute, bilateral lower quadrant  Pt is DNR has MOSE form with her from  SNF.  No intubation or cpr.  Open to ivf and abx and other interventions if helpful.  Admit to stepdown.  Kimorah Ridolfi A 09/05/2013, 12:38 AM

## 2013-09-05 NOTE — Progress Notes (Signed)
Subjective: She denies abdominal pain this AM.  Per RN, having multiple loose, foul smelling BMs.  Objective: Vital signs in last 24 hours: Temp:  [97.5 F (36.4 C)-97.8 F (36.6 C)] 97.5 F (36.4 C) (06/11 0417) Pulse Rate:  [88-104] 88 (06/11 0417) Resp:  [15-28] 22 (06/11 0800) BP: (108-185)/(45-129) 152/45 mmHg (06/11 0800) SpO2:  [86 %-99 %] 99 % (06/11 0800) Weight:  [141 lb 8.6 oz (64.2 kg)] 141 lb 8.6 oz (64.2 kg) (06/11 0157) Last BM Date: 09/05/13  Intake/Output from previous day: 06/10 0701 - 06/11 0700 In: 400 [I.V.:150; IV Piggyback:250] Out: -  Intake/Output this shift: Total I/O In: 275 [I.V.:225; IV Piggyback:50] Out: -   PE: General- Awake and alert in NAD Abdomen-soft, mild diffuse tenderness, no RLQ mass  Lab Results:   Recent Labs  09/04/13 1953 09/05/13 0225  WBC 14.7* 12.7*  HGB 13.5 13.2  HCT 40.4 39.5  PLT 510* 448*   BMET  Recent Labs  09/04/13 1953 09/05/13 0751  NA 138 140  K 3.7 3.5*  CL 95* 98  CO2 24 26  GLUCOSE 134* 103*  BUN 42* 44*  CREATININE 3.05* 3.00*  CALCIUM 9.0 8.0*   PT/INR  Recent Labs  09/05/13 0225  LABPROT 18.1*  INR 1.54*   Comprehensive Metabolic Panel:    Component Value Date/Time   NA 140 09/05/2013 0751   NA 138 09/04/2013 1953   K 3.5* 09/05/2013 0751   K 3.7 09/04/2013 1953   CL 98 09/05/2013 0751   CL 95* 09/04/2013 1953   CO2 26 09/05/2013 0751   CO2 24 09/04/2013 1953   BUN 44* 09/05/2013 0751   BUN 42* 09/04/2013 1953   CREATININE 3.00* 09/05/2013 0751   CREATININE 3.05* 09/04/2013 1953   GLUCOSE 103* 09/05/2013 0751   GLUCOSE 134* 09/04/2013 1953   CALCIUM 8.0* 09/05/2013 0751   CALCIUM 9.0 09/04/2013 1953   AST 17 09/04/2013 1953   AST 14 07/19/2013 0434   ALT 7 09/04/2013 1953   ALT 10 07/19/2013 0434   ALKPHOS 75 09/04/2013 1953   ALKPHOS 65 07/19/2013 0434   BILITOT 0.7 09/04/2013 1953   BILITOT 0.9 07/19/2013 0434   PROT 6.8 09/04/2013 1953   PROT 6.1 07/19/2013 0434   ALBUMIN 2.5*  09/04/2013 1953   ALBUMIN 3.4* 07/19/2013 0434     Studies/Results: Ct Abdomen Pelvis Wo Contrast  09/05/2013   CLINICAL DATA:  Low abdominal pain.  EXAM: CT ABDOMEN AND PELVIS WITHOUT CONTRAST  TECHNIQUE: Multidetector CT imaging of the abdomen and pelvis was performed following the standard protocol without IV contrast.  COMPARISON:  CT of the abdomen and pelvis July 18, 2013.  FINDINGS: LUNG BASES: Included view of the lung bases demonstrate new small right pleural effusion with patchy airspace opacity. The visualized heart and pericardium are unchanged. At least 3.8 cm distal thoracic aortic ectasia, unchanged.  KIDNEYS/BLADDER: Kidneys are orthotopic, demonstrating normal size and morphology. No nephrolithiasis, hydronephrosis; limited assessment for renal masses on this nonenhanced examination. 17 mm left interpolar cyst similar The unopacified ureters are normal in course and caliber. Urinary bladder is partially distended and unremarkable.  SOLID ORGANS: The liver, spleen, pancreas and adrenal glands are unremarkable for this non-contrast examination. Multiple tiny cholelithiasis without CT findings of acute cholecystitis.  GASTROINTESTINAL TRACT: Small to moderate fluid filled hiatal hernia. Distended small bowel to 3.8 cm with air-fluid levels and small bowel feces. No discrete transition point, gentle tapering may reflect ileus. 2.3 x 2.9 cm inflammatory  mass adjacent to the cecum, axial 57/92. The mass appears contiguous with the appendix, and likely reflects acute or possibly perforated appendicitis. The stomach, and large bowel are normal in course and caliber without inflammatory changes, the sensitivity may be decreased by lack of enteric contrast. Colonic diverticulosis.  PERITONEUM/RETROPERITONEUM: No intraperitoneal free fluid nor free air. Aortoiliac vessels are normal in course and caliber, with moderate to severe calcific atherosclerosis. 2.7 cm ectatic infrarenal aorta. No  lymphadenopathy by CT size criteria. The left adnexal cystic mass is greatly decreased in size from 8.1 x 8.4 cm to 4.1 x 0.8 cm. Slight increase in size of right adnexal mass, previously 5.4 x 3.4 cm, now 3.8 x 5.7 cm.  SOFT TISSUES/ OSSEOUS STRUCTURES: The soft tissues and included osseous structures are nonsuspicious. Multilevel severe degenerative disc disease.  IMPRESSION: Probable acute versus possibly early perforated appendicitis as evidenced by 2.9 x 2.3 cm inflammatory mass adjacent to the cecum. No abscess or pneumoperitoneum.  Low-grade partial small bowel obstruction or, ileus from adjacent appendiceal process.  Bilaterals cystic adnexal masses, smaller on the left which would be atypical for neoplasm. Recommend follow-up ultrasound for further characterization.  Small right pleural effusion with right lower lobe airspace opacity which may reflect atelectasis, less likely early pneumonia.  Severe aortic atherosclerosis was similar multi segmental ectasia.  Preliminary findings discussed with and reconfirmed by [facility] on6/10/2015at12:12 AM.   Electronically Signed   By: Elon Alas   On: 09/05/2013 00:13   Dg Chest Portable 1 View  09/04/2013   CLINICAL DATA:  Ventricular tachycardia.  Vomiting.  EXAM: PORTABLE CHEST - 1 VIEW  COMPARISON:  07/25/2013  FINDINGS: Heart size and pulmonary vascularity are normal. Lungs are clear although somewhat hyperinflated suggesting emphysema. No acute osseous abnormality. Surgical clips of visible in the right axilla.  IMPRESSION: No acute abnormality.  Probable emphysema.   Electronically Signed   By: Rozetta Nunnery M.D.   On: 09/04/2013 21:51    Anti-infectives: Anti-infectives   Start     Dose/Rate Route Frequency Ordered Stop   09/05/13 0800  piperacillin-tazobactam (ZOSYN) IVPB 2.25 g     2.25 g 100 mL/hr over 30 Minutes Intravenous Every 6 hours 09/05/13 0213     09/05/13 0030  piperacillin-tazobactam (ZOSYN) IVPB 3.375 g     3.375  g 100 mL/hr over 30 Minutes Intravenous  Once 09/05/13 0028 09/05/13 0128      Assessment Principal Problem:   RLQ inflammatory/infectious process due to acute perforated appendicitis or cecal diverticulitis Active Problems:   COPD (chronic obstructive pulmonary disease) with emphysema   ARF (acute renal failure)   Diastolic CHF, chronic   Chronic respiratory failure with hypoxia   Ventricular tachycardia, sustained    LOS: 1 day   Plan: Continue non-operative management with bowel rest and IV abxs.  Check C. Diff results.  Add IV Flagyl.   Lupe Bonner J 09/05/2013

## 2013-09-06 DIAGNOSIS — R1031 Right lower quadrant pain: Secondary | ICD-10-CM

## 2013-09-06 DIAGNOSIS — A0472 Enterocolitis due to Clostridium difficile, not specified as recurrent: Secondary | ICD-10-CM

## 2013-09-06 DIAGNOSIS — J9620 Acute and chronic respiratory failure, unspecified whether with hypoxia or hypercapnia: Secondary | ICD-10-CM

## 2013-09-06 LAB — CBC
HCT: 31.6 % — ABNORMAL LOW (ref 36.0–46.0)
HEMOGLOBIN: 10.3 g/dL — AB (ref 12.0–15.0)
MCH: 30 pg (ref 26.0–34.0)
MCHC: 32.6 g/dL (ref 30.0–36.0)
MCV: 92.1 fL (ref 78.0–100.0)
Platelets: 345 10*3/uL (ref 150–400)
RBC: 3.43 MIL/uL — ABNORMAL LOW (ref 3.87–5.11)
RDW: 14.1 % (ref 11.5–15.5)
WBC: 13.3 10*3/uL — ABNORMAL HIGH (ref 4.0–10.5)

## 2013-09-06 LAB — BASIC METABOLIC PANEL
BUN: 41 mg/dL — ABNORMAL HIGH (ref 6–23)
CALCIUM: 7.8 mg/dL — AB (ref 8.4–10.5)
CO2: 25 mEq/L (ref 19–32)
Chloride: 104 mEq/L (ref 96–112)
Creatinine, Ser: 2.71 mg/dL — ABNORMAL HIGH (ref 0.50–1.10)
GFR calc Af Amer: 18 mL/min — ABNORMAL LOW (ref >90)
GFR calc non Af Amer: 15 mL/min — ABNORMAL LOW (ref >90)
Glucose, Bld: 68 mg/dL — ABNORMAL LOW (ref 70–99)
Potassium: 3 mEq/L — ABNORMAL LOW (ref 3.7–5.3)
SODIUM: 141 meq/L (ref 137–147)

## 2013-09-06 MED ORDER — POTASSIUM CHLORIDE CRYS ER 20 MEQ PO TBCR
40.0000 meq | EXTENDED_RELEASE_TABLET | Freq: Once | ORAL | Status: AC
  Administered 2013-09-06: 40 meq via ORAL
  Filled 2013-09-06: qty 2

## 2013-09-06 NOTE — Progress Notes (Signed)
    Subjective:  No dyspnea or palpitations.   Objective:  Vital Signs in the last 24 hours: Temp:  [97.8 F (36.6 C)-98.2 F (36.8 C)] 98 F (36.7 C) (06/12 0516) Pulse Rate:  [59-71] 65 (06/12 0516) Resp:  [18-22] 20 (06/12 0516) BP: (82-152)/(45-119) 137/63 mmHg (06/12 0516) SpO2:  [95 %-100 %] 100 % (06/12 0516) Weight:  [67.2 kg (148 lb 2.4 oz)] 67.2 kg (148 lb 2.4 oz) (06/12 0516)  Intake/Output from previous day: 06/11 0701 - 06/12 0700 In: 2150 [P.O.:120; I.V.:1530; IV Piggyback:500] Out: -   Physical Exam: Pt is somnolent but easily arousable. NAD. HEENT: normal Neck: JVP - normal Lungs: CTA bilaterally CV: RRR without murmur or gallop Abd: soft, NT, Positive BS, no hepatomegaly Ext: no C/C/E Skin: warm/dry no rash   Lab Results:  Recent Labs  09/05/13 0225 09/06/13 0445  WBC 12.7* 13.3*  HGB 13.2 10.3*  PLT 448* 345    Recent Labs  09/05/13 0751 09/06/13 0445  NA 140 141  K 3.5* 3.0*  CL 98 104  CO2 26 25  GLUCOSE 103* 68*  BUN 44* 41*  CREATININE 3.00* 2.71*    Recent Labs  09/05/13 0751 09/05/13 1354  TROPONINI <0.30 <0.30    Cardiac Studies: 2D Echo: Study Conclusions  - Left ventricle: The cavity size was normal. There was mild focal basal hypertrophy of the septum. Systolic function was normal. The estimated ejection fraction was in the range of 60% to 65%. Wall motion was normal; there were no regional wall motion abnormalities. There was an increased relative contribution of atrial contraction to ventricular filling. Doppler parameters are consistent with abnormal left ventricular relaxation (grade 1 diastolic dysfunction). - Mitral valve: There was trivial regurgitation.  Tele: Personally reviewed - sinus rhythm without arrhythmia.  Assessment/Plan:  1. Wide Complex Tachycardia  2. HFpEF  3. HTN  4. COPD  5. Acute Kidney Injury 6. Perforated appendicitis  Heart rhythm stable in sinus without further  wide-complex tachycardia or even PVC's. LVEF normal by echo. Continue beta-bblocker at current dose. No further cardiac eval indicated at this time. Please call if any cardiac issues arise. thx  Sherren Mocha, M.D. 09/06/2013, 7:53 AM

## 2013-09-06 NOTE — Progress Notes (Signed)
Patient ID: Renee Donovan, female   DOB: 1930-11-28, 80 y.o.   MRN: [id number]  [facility] PROGRESS NOTE  MYOSHA CUADRAS NIO:[id number] DOB: 11/30/1930 DOA: 09/04/2013 PCP: [name], MD  Brief narrative: 80 year old female with past medical history of COPD on 2 L home oxygen, osteoarthritis, from SNF who presented to [facility] ED 09/04/2013 with worsening nausea, vomiting, diarrhea and associated lower abdominal pain for past 5 days prior to this admission. On admission, patient was found to have possible perforated appendicitis for which surgery was consulted. In addition, patient had about 10 minute episode of wide complex tachycardia which has resolved with a bolus of amiodarone. Blood work was significant for leukocytosis of 14.7 and creatinine of 3.05. Cardiac enzymes were WNL.   Assessment and Plan:  Principal Problem:  Acute perforated appendicitis  CT abdomen on admission showed probable acute versus possibly early perforated appendicitis as evidenced by 2.9 x 2.3 cm inflammatory mass adjacent to the cecum. No abscess. Also seen is possible low grade small bowel obstruction or ileus from adjacent appendicitis. WBC trending down and pt feeling better this AM.  Per surgery this is likely subacute appendicitis versus cecal diverticulitis.  Appreciate surgery consult and their recommendations. Continue non-operative management with bowel rest and IV abx.  Will continue Zosyn for g- coverage and flagyl for C. Diff and atypical bacterial coverage  Advance diet to clears  Active Problems:  COPD (chronic obstructive pulmonary disease) with emphysema / Acute respiratory failure with hypoxia  Stable, continue oxygen support via nasal canula to keep O2 saturation above 90%  Continue spiriva Acute on chronic kidney disease, stage 4  Most recent baseline creatinine around 1.8 and on this admission 3.05; likely in the setting of acute perforated appendicitis. She was also on lisinopril  for blood pressure which could have exacerbated renal failure. Lisinopril is on hold.  We will continue IV fluids, normal saline @ 75 cc/hr  Cr is trending down  Check BMP in am Acute blood loss anemia  Likely dilutional, no signs of active bleeding, repeat CBC in AM Hypokalemia  Supplement and repeat BMP in AM  Nausea, vomiting, diarrhea  C. Diff positive  Lipase level was WNL on this admission  Continue Flagyl 500 mg IV TID day #2/14 Continue supportive care with IV fluids, antibiotics Diastolic CHF, chronic  Last 2 D ECHO was in 06/2013 with preserved EF and grade 2 diastolic dysfunction  Appreciate cardiology following  Ventricular tachycardia, sustained  Likely in the setting of acute illness or metabolic derangements; cardiac enzymes negative x 2 sets  Recent ECHO in 06/2013 showed preserved EF with grade 2 diastolic dysfunction.  Currently on metoprolol and will continue per cardio rec's  Hypertension  Continue catapres 0.2 mg weekly, Imdur 120 mg daily and metoprolol 25 mg PO BID  Consultants:  Cardiology Surgery  Procedures/Studies:  Ct Abdomen Pelvis Wo Contras 09/05/2013 IMPRESSION: Probable acute versus possibly early perforated appendicitis as evidenced by 2.9 x 2.3 cm inflammatory mass adjacent to the cecum. No abscess or pneumoperitoneum. Low-grade partial small bowel obstruction or, ileus from adjacent appendiceal process. Bilaterals cystic adnexal masses, smaller on the left which would be atypical for neoplasm. Recommend follow-up ultrasound for further characterization. Small right pleural effusion with right lower lobe airspace opacity which may reflect atelectasis, less likely early pneumonia. Severe aortic atherosclerosis was similar multi segmental ectasia.  Dg Chest Portable 1 View 09/04/2013 IMPRESSION: No acute abnormality. Probable emphysema.  Antibiotics:  Zosyn 09/04/2013 -->  Flagyl  09/05/2013 -->  Code Status: DNR/DNI  Family Communication: Updated  daughter in law Cameron at bedside, phone number [phone]  Disposition Plan: Remains inpatient   HPI/Subjective: No events overnight. Pt reports feeling better.  Objective: Filed Vitals:   09/06/13 0516 09/06/13 0918 09/06/13 1026 09/06/13 1322  BP: 137/63  135/57 121/64  Pulse: 65  75 68  Temp: 98 F (36.7 C)   97.5 F (36.4 C)  TempSrc: Oral   Oral  Resp: 20   24  Height:      Weight: 67.2 kg (148 lb 2.4 oz)     SpO2: 100% 97%  97%    Intake/Output Summary (Last 24 hours) at 09/06/13 1327 Last data filed at 09/06/13 0700  Gross per 24 hour  Intake   1775 ml  Output      0 ml  Net   1775 ml    Exam:   General:  Pt is alert, follows commands appropriately, not in acute distress  Cardiovascular: Regular rate and rhythm, S1/S2, no murmurs, no rubs, no gallops  Respiratory: Clear to auscultation bilaterally, no wheezing, no crackles, no rhonchi  Abdomen: Soft, non tender, non distended, bowel sounds present, no guarding  Extremities: No edema, pulses DP and PT palpable bilaterally  Neuro: Grossly nonfocal  Data Reviewed: Basic Metabolic Panel:  Recent Labs Lab 09/04/13 1953 09/05/13 0751 09/06/13 0445  NA 138 140 141  K 3.7 3.5* 3.0*  CL 95* 98 104  CO2 24 26 25   GLUCOSE 134* 103* 68*  BUN 42* 44* 41*  CREATININE 3.05* 3.00* 2.71*  CALCIUM 9.0 8.0* 7.8*  MG 1.7  --   --    Liver Function Tests:  Recent Labs Lab 09/04/13 1953  AST 17  ALT 7  ALKPHOS 75  BILITOT 0.7  PROT 6.8  ALBUMIN 2.5*    Recent Labs Lab 09/04/13 1953  LIPASE 16    CBC:  Recent Labs Lab 09/04/13 1953 09/05/13 0225 09/06/13 0445  WBC 14.7* 12.7* 13.3*  NEUTROABS 11.0*  --   --   HGB 13.5 13.2 10.3*  HCT 40.4 39.5 31.6*  MCV 89.8 90.2 92.1  PLT 510* 448* 345   Cardiac Enzymes:  Recent Labs Lab 09/04/13 1953 09/05/13 0225 09/05/13 0751 09/05/13 1354  TROPONINI <0.30 <0.30 <0.30 <0.30   Recent Results (from the past 240 hour(s))  MRSA PCR SCREENING      Status: None   Collection Time    09/05/13  1:48 AM      Result Value Ref Range Status   MRSA by PCR NEGATIVE  NEGATIVE Final   Comment:            The GeneXpert MRSA Assay (FDA     approved for NASAL specimens     only), is one component of a     comprehensive MRSA colonization     surveillance program. It is not     intended to diagnose MRSA     infection nor to guide or     monitor treatment for     MRSA infections.  CLOSTRIDIUM DIFFICILE BY PCR     Status: Abnormal   Collection Time    09/05/13  4:23 AM      Result Value Ref Range Status   C difficile by pcr POSITIVE (*) NEGATIVE Final   Comment: CRITICAL RESULT CALLED TO, READ BACK BY AND VERIFIED WITH:     WEST P RN 09/05/13 0859 COSTELLO B     Performed at  [facility]     Scheduled Meds: . albuterol  2.5 mg Nebulization BID  . [START ON 09/09/2013] cloNIDine  0.2 mg Transdermal Weekly  . isosorbide mononitrate  120 mg Oral Daily  . metoprolol tartrate  25 mg Oral BID  . metronidazole  500 mg Intravenous 3 times per day  . piperacillin-tazobactam (ZOSYN)  IV  2.25 g Intravenous Q6H  . tiotropium  18 mcg Inhalation Daily   Continuous Infusions: . sodium chloride 75 mL/hr at 09/06/13 7893   Faye Ramsay, MD  [facility] Pager [phone]  If 7PM-7AM, please contact night-coverage [email or web address] Password [email or web address] 09/06/2013, 1:27 PM   LOS: 2 days

## 2013-09-06 NOTE — Progress Notes (Signed)
Patient seen.  Positive for C. Diff.  Clinically improved.

## 2013-09-06 NOTE — Progress Notes (Signed)
Clinical Social Work Department BRIEF PSYCHOSOCIAL ASSESSMENT 09/06/2013  Patient:  Renee Donovan, Renee Donovan     Account Number:  [id number]     Admit date:  09/04/2013  Clinical Social Worker:  Renold Genta  Date/Time:  09/06/2013 11:33 AM  Referred by:  Physician  Date Referred:  09/06/2013 Referred for  Other - See comment   Other Referral:   Admitted from: [facility]   Interview type:  Patient Other interview type:    PSYCHOSOCIAL DATA Living Status:  FACILITY Admitted from facility:  [facility] Level of care:  [place] Primary support name:  Marilynne Dupuis (grandson) cell#: [phone] Primary support relationship to patient:  FAMILY Degree of support available:   good    CURRENT CONCERNS Current Concerns  Post-Acute Placement   Other Concerns:    SOCIAL WORK ASSESSMENT / PLAN CSW received consult that patient was admitted from [facility].   Assessment/plan status:  Information/Referral to [employer] Other assessment/ plan:   Information/referral to community resources:   CSW completed FL2 and faxed information to [place] [place], confirmed with Narda Rutherford that they would be able to take patient back when stable.    PATIENT'S/FAMILY'S RESPONSE TO PLAN OF CARE: Patient's son states that he is holding a bed at [facility] for her & that he's been pleased with the care she's been receiving at [place], [place] Worker cell #: [phone]

## 2013-09-06 NOTE — Progress Notes (Signed)
Patient ID: Renee Donovan, female   DOB: 12-27-1930, 80 y.o.   MRN: [id number]    Subjective: Pt feels well this morning.  No complaints.  No abdominal pain.  No nausea, but no BMs either  Objective: Vital signs in last 24 hours: Temp:  [98 F (36.7 C)-98.2 F (36.8 C)] 98 F (36.7 C) (06/12 0516) Pulse Rate:  [59-71] 65 (06/12 0516) Resp:  [18-20] 20 (06/12 0516) BP: (82-140)/(51-119) 137/63 mmHg (06/12 0516) SpO2:  [95 %-100 %] 97 % (06/12 0918) Weight:  [148 lb 2.4 oz (67.2 kg)] 148 lb 2.4 oz (67.2 kg) (06/12 0516) Last BM Date: 09/05/13  Intake/Output from previous day: 06/11 0701 - 06/12 0700 In: 2150 [P.O.:120; I.V.:1530; IV Piggyback:500] Out: -  Intake/Output this shift:    PE: Abd: soft, NT, Nd, few BS  Lab Results:   Recent Labs  09/05/13 0225 09/06/13 0445  WBC 12.7* 13.3*  HGB 13.2 10.3*  HCT 39.5 31.6*  PLT 448* 345   BMET  Recent Labs  09/05/13 0751 09/06/13 0445  NA 140 141  K 3.5* 3.0*  CL 98 104  CO2 26 25  GLUCOSE 103* 68*  BUN 44* 41*  CREATININE 3.00* 2.71*  CALCIUM 8.0* 7.8*   PT/INR  Recent Labs  09/05/13 0225  LABPROT 18.1*  INR 1.54*   CMP     Component Value Date/Time   NA 141 09/06/2013 0445   K 3.0* 09/06/2013 0445   CL 104 09/06/2013 0445   CO2 25 09/06/2013 0445   GLUCOSE 68* 09/06/2013 0445   BUN 41* 09/06/2013 0445   CREATININE 2.71* 09/06/2013 0445   CALCIUM 7.8* 09/06/2013 0445   PROT 6.8 09/04/2013 1953   ALBUMIN 2.5* 09/04/2013 1953   AST 17 09/04/2013 1953   ALT 7 09/04/2013 1953   ALKPHOS 75 09/04/2013 1953   BILITOT 0.7 09/04/2013 1953   GFRNONAA 15* 09/06/2013 0445   GFRAA 18* 09/06/2013 0445   Lipase     Component Value Date/Time   LIPASE 16 09/04/2013 1953       Studies/Results: Ct Abdomen Pelvis Wo Contrast  09/05/2013   CLINICAL DATA:  Low abdominal pain.  EXAM: CT ABDOMEN AND PELVIS WITHOUT CONTRAST  TECHNIQUE: Multidetector CT imaging of the abdomen and pelvis was performed following the  standard protocol without IV contrast.  COMPARISON:  CT of the abdomen and pelvis July 18, 2013.  FINDINGS: LUNG BASES: Included view of the lung bases demonstrate new small right pleural effusion with patchy airspace opacity. The visualized heart and pericardium are unchanged. At least 3.8 cm distal thoracic aortic ectasia, unchanged.  KIDNEYS/BLADDER: Kidneys are orthotopic, demonstrating normal size and morphology. No nephrolithiasis, hydronephrosis; limited assessment for renal masses on this nonenhanced examination. 17 mm left interpolar cyst similar The unopacified ureters are normal in course and caliber. Urinary bladder is partially distended and unremarkable.  SOLID ORGANS: The liver, spleen, pancreas and adrenal glands are unremarkable for this non-contrast examination. Multiple tiny cholelithiasis without CT findings of acute cholecystitis.  GASTROINTESTINAL TRACT: Small to moderate fluid filled hiatal hernia. Distended small bowel to 3.8 cm with air-fluid levels and small bowel feces. No discrete transition point, gentle tapering may reflect ileus. 2.3 x 2.9 cm inflammatory mass adjacent to the cecum, axial 57/92. The mass appears contiguous with the appendix, and likely reflects acute or possibly perforated appendicitis. The stomach, and large bowel are normal in course and caliber without inflammatory changes, the sensitivity may be decreased by lack of enteric contrast.  Colonic diverticulosis.  PERITONEUM/RETROPERITONEUM: No intraperitoneal free fluid nor free air. Aortoiliac vessels are normal in course and caliber, with moderate to severe calcific atherosclerosis. 2.7 cm ectatic infrarenal aorta. No lymphadenopathy by CT size criteria. The left adnexal cystic mass is greatly decreased in size from 8.1 x 8.4 cm to 4.1 x 0.8 cm. Slight increase in size of right adnexal mass, previously 5.4 x 3.4 cm, now 3.8 x 5.7 cm.  SOFT TISSUES/ OSSEOUS STRUCTURES: The soft tissues and included osseous structures  are nonsuspicious. Multilevel severe degenerative disc disease.  IMPRESSION: Probable acute versus possibly early perforated appendicitis as evidenced by 2.9 x 2.3 cm inflammatory mass adjacent to the cecum. No abscess or pneumoperitoneum.  Low-grade partial small bowel obstruction or, ileus from adjacent appendiceal process.  Bilaterals cystic adnexal masses, smaller on the left which would be atypical for neoplasm. Recommend follow-up ultrasound for further characterization.  Small right pleural effusion with right lower lobe airspace opacity which may reflect atelectasis, less likely early pneumonia.  Severe aortic atherosclerosis was similar multi segmental ectasia.  Preliminary findings discussed with and reconfirmed by [facility] PICKERING on6/10/2015at12:12 AM.   Electronically Signed   By: Elon Alas   On: 09/05/2013 00:13   Dg Chest Portable 1 View  09/04/2013   CLINICAL DATA:  Ventricular tachycardia.  Vomiting.  EXAM: PORTABLE CHEST - 1 VIEW  COMPARISON:  07/25/2013  FINDINGS: Heart size and pulmonary vascularity are normal. Lungs are clear although somewhat hyperinflated suggesting emphysema. No acute osseous abnormality. Surgical clips of visible in the right axilla.  IMPRESSION: No acute abnormality.  Probable emphysema.   Electronically Signed   By: Rozetta Nunnery M.D.   On: 09/04/2013 21:51    Anti-infectives: Anti-infectives   Start     Dose/Rate Route Frequency Ordered Stop   09/05/13 1000  metroNIDAZOLE (FLAGYL) IVPB 500 mg     500 mg 100 mL/hr over 60 Minutes Intravenous 3 times per day 09/05/13 0834     09/05/13 0930  metroNIDAZOLE (FLAGYL) IVPB 500 mg  Status:  Discontinued     500 mg 100 mL/hr over 60 Minutes Intravenous Every 8 hours 09/05/13 0927 09/05/13 0939   09/05/13 0800  piperacillin-tazobactam (ZOSYN) IVPB 2.25 g     2.25 g 100 mL/hr over 30 Minutes Intravenous Every 6 hours 09/05/13 0213     09/05/13 0030  piperacillin-tazobactam (ZOSYN) IVPB 3.375 g     3.375  g 100 mL/hr over 30 Minutes Intravenous  Once 09/05/13 0028 09/05/13 0128       Assessment/Plan  1. RLQ inflammatory/infectious process due to acute perforated appendicitis or cecal diverticulitis 2. COPD 3. ARF 4. C diff colitis 5. Chronic respiratory failure with hypoxia 6. Ventricular tachycardia  Plan: 1. Cont conservative management with IV abx therapy.  Will defer C diff treatment to primary team 2. May have clear liquids today.   LOS: 2 days    Glendon Dunwoody E 09/06/2013, 9:22 AM Pager: [phone]

## 2013-09-06 NOTE — Progress Notes (Signed)
Continue to receive alarm from central tele that pt HR is 30's -40's. Vital signs obtain various with HR in 65-75 range. Pt is asymptomatic and without any complaint. Will continue to monitor pt

## 2013-09-07 ENCOUNTER — Other Ambulatory Visit: Payer: Self-pay

## 2013-09-07 DIAGNOSIS — K352 Acute appendicitis with generalized peritonitis, without abscess: Secondary | ICD-10-CM

## 2013-09-07 DIAGNOSIS — I5031 Acute diastolic (congestive) heart failure: Secondary | ICD-10-CM

## 2013-09-07 LAB — BASIC METABOLIC PANEL
BUN: 33 mg/dL — ABNORMAL HIGH (ref 6–23)
CALCIUM: 7.6 mg/dL — AB (ref 8.4–10.5)
CO2: 20 meq/L (ref 19–32)
Chloride: 105 mEq/L (ref 96–112)
Creatinine, Ser: 1.97 mg/dL — ABNORMAL HIGH (ref 0.50–1.10)
GFR, EST AFRICAN AMERICAN: 26 mL/min — AB (ref >90)
GFR, EST NON AFRICAN AMERICAN: 23 mL/min — AB (ref >90)
Glucose, Bld: 81 mg/dL (ref 70–99)
Potassium: 2.9 mEq/L — CL (ref 3.7–5.3)
SODIUM: 139 meq/L (ref 137–147)

## 2013-09-07 LAB — CBC
HCT: 30.3 % — ABNORMAL LOW (ref 36.0–46.0)
Hemoglobin: 10.1 g/dL — ABNORMAL LOW (ref 12.0–15.0)
MCH: 30.3 pg (ref 26.0–34.0)
MCHC: 33.3 g/dL (ref 30.0–36.0)
MCV: 91 fL (ref 78.0–100.0)
Platelets: 355 10*3/uL (ref 150–400)
RBC: 3.33 MIL/uL — AB (ref 3.87–5.11)
RDW: 14.1 % (ref 11.5–15.5)
WBC: 17.9 10*3/uL — ABNORMAL HIGH (ref 4.0–10.5)

## 2013-09-07 MED ORDER — POTASSIUM CHLORIDE 10 MEQ/100ML IV SOLN
10.0000 meq | INTRAVENOUS | Status: AC
  Administered 2013-09-07 (×3): 10 meq via INTRAVENOUS
  Filled 2013-09-07 (×3): qty 100

## 2013-09-07 MED ORDER — METOPROLOL TARTRATE 1 MG/ML IV SOLN
2.5000 mg | Freq: Once | INTRAVENOUS | Status: AC
  Administered 2013-09-07: 2.5 mg via INTRAVENOUS

## 2013-09-07 NOTE — Progress Notes (Signed)
PT Cancellation Note  Patient Details Name: Renee Donovan MRN: [id number] DOB: 11/20/1930   Cancelled Treatment:    Reason Eval/Treat Not Completed: Patient declined, no reason specified; reports comfortable, doesn't want to move right now.  Encouraged OOB at least by tomorrow.  Will return then to see patient.   Aalaya Yadao,CYNDI 09/07/2013, 2:52 PM

## 2013-09-07 NOTE — Progress Notes (Signed)
CRITICAL VALUE ALERT  Critical value received:  Potassium 2.9  Date of notification: 09/07/13  Time of notification: 0640  Critical value read back:yes  Nurse who received alert:  Reynold Bowen  MD notified (1st page):  Rogue Bussing  Time of first page:  [phone]  MD notified (2nd page):  Time of second page:  Responding MD:  Rogue Bussing  Time MD responded:  [phone]

## 2013-09-07 NOTE — Plan of Care (Signed)
Problem: Phase II Progression Outcomes Goal: Progress activity as tolerated unless otherwise ordered Outcome: Not Met (add Reason) Pt refused PT

## 2013-09-07 NOTE — Progress Notes (Signed)
Patient ID: Renee Donovan, female   DOB: 05/01/1930, 80 y.o.   MRN: [id number] [facility] PROGRESS NOTE  NALIAH EDDINGTON BZJ:[id number] DOB: 1931/02/03 DOA: 09/04/2013 PCP: [name], MD  Brief narrative:  80 year old female with past medical history of COPD on 2 L home oxygen, osteoarthritis, from SNF who presented to [facility] ED 09/04/2013 with worsening nausea, vomiting, diarrhea and associated lower abdominal pain for past 5 days prior to this admission. On admission, patient was found to have possible perforated appendicitis for which surgery was consulted. In addition, patient had about 10 minute episode of wide complex tachycardia which has resolved with a bolus of amiodarone. Blood work was significant for leukocytosis of 14.7 and creatinine of 3.05. Cardiac enzymes were WNL.   Assessment and Plan:  Principal Problem:  Acute perforated appendicitis  CT abdomen on admission showed probable acute versus possibly early perforated appendicitis as evidenced by 2.9 x 2.3 cm inflammatory mass adjacent to the cecum. No abscess. Also seen is possible low grade small bowel obstruction or ileus from adjacent appendicitis. Pt reports feeling better this AM but WBC trending up over the past 24 hours  Per surgery this is likely subacute appendicitis versus cecal diverticulitis.  Appreciate surgery consult and their recommendations. Continue non-operative management with bowel rest and IV abx.  Will continue Zosyn for g- coverage and flagyl for C. Diff and atypical bacterial coverage  Agree with surgery to add oral Vanc if no improvement  Active Problems:  COPD (chronic obstructive pulmonary disease) with emphysema / Acute respiratory failure with hypoxia  Stable, continue oxygen support via nasal canula to keep O2 saturation above 90%  Continue spiriva Acute on chronic kidney disease, stage 4  Most recent baseline creatinine around 1.8 and on this admission 3.05; likely in the setting of  acute perforated appendicitis. She was also on lisinopril for blood pressure which could have exacerbated renal failure. Lisinopril is on hold.  We will continue IV fluids, normal saline @ 75 cc/hr  Cr is trending down  Check BMP in am Acute blood loss anemia  Likely dilutional, no signs of active bleeding, repeat CBC in AM Hypokalemia  Supplement as indicated and repeat BMP in AM  Nausea, vomiting, diarrhea  C. Diff positive  Lipase level was WNL on this admission  Continue Flagyl 500 mg IV TID day #2/14  Continue supportive care with IV fluids, antibiotics Diastolic CHF, chronic  Last 2 D ECHO was in 06/2013 with preserved EF and grade 2 diastolic dysfunction  Appreciate cardiology following  Ventricular tachycardia, sustained  Likely in the setting of acute illness or metabolic derangements; cardiac enzymes negative x 2 sets  Recent ECHO in 06/2013 showed preserved EF with grade 2 diastolic dysfunction.  Currently on metoprolol and will continue per cardio rec's  Hypertension  Continue catapres 0.2 mg weekly, Imdur 120 mg daily and metoprolol 25 mg PO BID  Consultants:  Cardiology  Surgery  Procedures/Studies:  Ct Abdomen Pelvis Wo Contras 09/05/2013 IMPRESSION: Probable acute versus possibly early perforated appendicitis as evidenced by 2.9 x 2.3 cm inflammatory mass adjacent to the cecum. No abscess or pneumoperitoneum. Low-grade partial small bowel obstruction or, ileus from adjacent appendiceal process. Bilaterals cystic adnexal masses, smaller on the left which would be atypical for neoplasm. Recommend follow-up ultrasound for further characterization. Small right pleural effusion with right lower lobe airspace opacity which may reflect atelectasis, less likely early pneumonia. Severe aortic atherosclerosis was similar multi segmental ectasia.  Dg Chest Portable 1  View 09/04/2013 IMPRESSION: No acute abnormality. Probable emphysema.  Antibiotics:  Zosyn 09/04/2013 -->  Flagyl  09/05/2013 -->  Code Status: DNR/DNI  Family Communication: Updated daughter in law Avalon at bedside, phone number [phone]  Disposition Plan: Remains inpatient    HPI/Subjective: No events overnight.   Objective: Filed Vitals:   09/06/13 2109 09/07/13 0501 09/07/13 0521 09/07/13 0755  BP: 143/60 160/58    Pulse: 75 65    Temp: 98.3 F (36.8 C) 97.8 F (36.6 C)    TempSrc: Oral Oral    Resp: 22 20    Height:      Weight:   67.8 kg (149 lb 7.6 oz)   SpO2: 97% 95%  97%    Intake/Output Summary (Last 24 hours) at 09/07/13 1430 Last data filed at 09/07/13 1144  Gross per 24 hour  Intake 1716.67 ml  Output    700 ml  Net 1016.67 ml    Exam:   General:  Pt is alert, follows commands appropriately, not in acute distress  Cardiovascular: Regular rate and rhythm, no rubs, no gallops  Respiratory: Clear to auscultation bilaterally, no wheezing, no crackles, no rhonchi  Abdomen: Soft, non tender, non distended, bowel sounds present, no guarding  Extremities: No edema, pulses DP and PT palpable bilaterally  Neuro: Grossly nonfocal  Data Reviewed: Basic Metabolic Panel:  Recent Labs Lab 09/04/13 1953 09/05/13 0751 09/06/13 0445 09/07/13 0600  NA 138 140 141 139  K 3.7 3.5* 3.0* 2.9*  CL 95* 98 104 105  CO2 24 26 25 20   GLUCOSE 134* 103* 68* 81  BUN 42* 44* 41* 33*  CREATININE 3.05* 3.00* 2.71* 1.97*  CALCIUM 9.0 8.0* 7.8* 7.6*  MG 1.7  --   --   --    Liver Function Tests:  Recent Labs Lab 09/04/13 1953  AST 17  ALT 7  ALKPHOS 75  BILITOT 0.7  PROT 6.8  ALBUMIN 2.5*    Recent Labs Lab 09/04/13 1953  LIPASE 16   CBC:  Recent Labs Lab 09/04/13 1953 09/05/13 0225 09/06/13 0445 09/07/13 0600  WBC 14.7* 12.7* 13.3* 17.9*  NEUTROABS 11.0*  --   --   --   HGB 13.5 13.2 10.3* 10.1*  HCT 40.4 39.5 31.6* 30.3*  MCV 89.8 90.2 92.1 91.0  PLT 510* 448* 345 355   Cardiac Enzymes:  Recent Labs Lab 09/04/13 1953 09/05/13 0225 09/05/13 0751  09/05/13 1354  TROPONINI <0.30 <0.30 <0.30 <0.30     Recent Results (from the past 240 hour(s))  MRSA PCR SCREENING     Status: None   Collection Time    09/05/13  1:48 AM      Result Value Ref Range Status   MRSA by PCR NEGATIVE  NEGATIVE Final   Comment:            The GeneXpert MRSA Assay (FDA     approved for NASAL specimens     only), is one component of a     comprehensive MRSA colonization     surveillance program. It is not     intended to diagnose MRSA     infection nor to guide or     monitor treatment for     MRSA infections.  CLOSTRIDIUM DIFFICILE BY PCR     Status: Abnormal   Collection Time    09/05/13  4:23 AM      Result Value Ref Range Status   C difficile by pcr POSITIVE (*) NEGATIVE Final  Comment: CRITICAL RESULT CALLED TO, READ BACK BY AND VERIFIED WITH:     WEST P RN 09/05/13 0859 COSTELLO B     Performed at [facility]     Scheduled Meds: . albuterol  2.5 mg Nebulization BID  . antiseptic oral rinse  15 mL Mouth Rinse q12n4p  . chlorhexidine  15 mL Mouth Rinse BID  . [START ON 09/09/2013] cloNIDine  0.2 mg Transdermal Weekly  . isosorbide mononitrate  120 mg Oral Daily  . metoprolol tartrate  25 mg Oral BID  . metronidazole  500 mg Intravenous 3 times per day  . piperacillin-tazobactam (ZOSYN)  IV  2.25 g Intravenous Q6H  . tiotropium  18 mcg Inhalation Daily   Continuous Infusions: . sodium chloride 50 mL/hr at 09/06/13 1804   Faye Ramsay, MD  [facility] Pager [phone]  If 7PM-7AM, please contact night-coverage [email or web address] Password [email or web address] 09/07/2013, 2:30 PM   LOS: 3 days

## 2013-09-07 NOTE — Progress Notes (Signed)
  Subjective: She denies pain or nausea  Objective: Vital signs in last 24 hours: Temp:  [97.5 F (36.4 C)-98.3 F (36.8 C)] 97.8 F (36.6 C) (06/13 0501) Pulse Rate:  [65-75] 65 (06/13 0501) Resp:  [20-24] 20 (06/13 0501) BP: (121-160)/(57-64) 160/58 mmHg (06/13 0501) SpO2:  [95 %-97 %] 97 % (06/13 0755) Weight:  [149 lb 7.6 oz (67.8 kg)] 149 lb 7.6 oz (67.8 kg) (06/13 0521) Last BM Date: 09/07/13  Intake/Output from previous day: 06/12 0701 - 06/13 0700 In: 1786.7 [P.O.:540; I.V.:846.7; IV Piggyback:400] Out: 700 [Stool:700] Intake/Output this shift:    Resp: clear to auscultation bilaterally Cardio: regular rate and rhythm GI: soft, tender over lower abdomen but no peritonitis.   Lab Results:   Recent Labs  09/06/13 0445 09/07/13 0600  WBC 13.3* 17.9*  HGB 10.3* 10.1*  HCT 31.6* 30.3*  PLT 345 355   BMET  Recent Labs  09/06/13 0445 09/07/13 0600  NA 141 139  K 3.0* 2.9*  CL 104 105  CO2 25 20  GLUCOSE 68* 81  BUN 41* 33*  CREATININE 2.71* 1.97*  CALCIUM 7.8* 7.6*   PT/INR  Recent Labs  09/05/13 0225  LABPROT 18.1*  INR 1.54*   ABG No results found for this basename: PHART, PCO2, PO2, HCO3,  in the last 72 hours  Studies/Results: No results found.  Anti-infectives: Anti-infectives   Start     Dose/Rate Route Frequency Ordered Stop   09/05/13 1000  metroNIDAZOLE (FLAGYL) IVPB 500 mg     500 mg 100 mL/hr over 60 Minutes Intravenous 3 times per day 09/05/13 0834     09/05/13 0930  metroNIDAZOLE (FLAGYL) IVPB 500 mg  Status:  Discontinued     500 mg 100 mL/hr over 60 Minutes Intravenous Every 8 hours 09/05/13 0927 09/05/13 0939   09/05/13 0800  piperacillin-tazobactam (ZOSYN) IVPB 2.25 g     2.25 g 100 mL/hr over 30 Minutes Intravenous Every 6 hours 09/05/13 0213     09/05/13 0030  piperacillin-tazobactam (ZOSYN) IVPB 3.375 g     3.375 g 100 mL/hr over 30 Minutes Intravenous  Once 09/05/13 0028 09/05/13 0128       Assessment/Plan: s/p * No surgery found * continue abx for both appendiceal abscess and C. diff colitis If no improvement on wbc then consider adding oral vanc Will follow  LOS: 3 days    TOTH III,Burk Hoctor S 09/07/2013

## 2013-09-08 LAB — BASIC METABOLIC PANEL
BUN: 25 mg/dL — ABNORMAL HIGH (ref 6–23)
CHLORIDE: 105 meq/L (ref 96–112)
CO2: 19 mEq/L (ref 19–32)
Calcium: 7.5 mg/dL — ABNORMAL LOW (ref 8.4–10.5)
Creatinine, Ser: 1.65 mg/dL — ABNORMAL HIGH (ref 0.50–1.10)
GFR calc non Af Amer: 28 mL/min — ABNORMAL LOW (ref >90)
GFR, EST AFRICAN AMERICAN: 33 mL/min — AB (ref >90)
Glucose, Bld: 163 mg/dL — ABNORMAL HIGH (ref 70–99)
Potassium: 3.1 mEq/L — ABNORMAL LOW (ref 3.7–5.3)
SODIUM: 139 meq/L (ref 137–147)

## 2013-09-08 LAB — CBC
HCT: 33.5 % — ABNORMAL LOW (ref 36.0–46.0)
Hemoglobin: 11.3 g/dL — ABNORMAL LOW (ref 12.0–15.0)
MCH: 30.3 pg (ref 26.0–34.0)
MCHC: 33.7 g/dL (ref 30.0–36.0)
MCV: 89.8 fL (ref 78.0–100.0)
PLATELETS: 377 10*3/uL (ref 150–400)
RBC: 3.73 MIL/uL — AB (ref 3.87–5.11)
RDW: 14 % (ref 11.5–15.5)
WBC: 19.3 10*3/uL — AB (ref 4.0–10.5)

## 2013-09-08 MED ORDER — LEVALBUTEROL HCL 1.25 MG/0.5ML IN NEBU
1.2500 mg | INHALATION_SOLUTION | Freq: Three times a day (TID) | RESPIRATORY_TRACT | Status: DC
  Administered 2013-09-08: 1.25 mg via RESPIRATORY_TRACT
  Filled 2013-09-08 (×3): qty 0.5

## 2013-09-08 MED ORDER — LEVALBUTEROL HCL 1.25 MG/0.5ML IN NEBU
1.2500 mg | INHALATION_SOLUTION | Freq: Four times a day (QID) | RESPIRATORY_TRACT | Status: DC | PRN
  Filled 2013-09-08 (×2): qty 0.5

## 2013-09-08 MED ORDER — LEVALBUTEROL HCL 1.25 MG/0.5ML IN NEBU
1.2500 mg | INHALATION_SOLUTION | Freq: Three times a day (TID) | RESPIRATORY_TRACT | Status: DC
  Administered 2013-09-08 – 2013-09-10 (×5): 1.25 mg via RESPIRATORY_TRACT
  Filled 2013-09-08 (×8): qty 0.5

## 2013-09-08 MED ORDER — METOPROLOL TARTRATE 1 MG/ML IV SOLN
2.5000 mg | Freq: Once | INTRAVENOUS | Status: AC
  Administered 2013-09-08: 2.5 mg via INTRAVENOUS
  Filled 2013-09-08: qty 5

## 2013-09-08 MED ORDER — PIPERACILLIN-TAZOBACTAM 3.375 G IVPB
3.3750 g | Freq: Three times a day (TID) | INTRAVENOUS | Status: DC
  Administered 2013-09-08 – 2013-09-15 (×21): 3.375 g via INTRAVENOUS
  Filled 2013-09-08 (×23): qty 50

## 2013-09-08 MED ORDER — POTASSIUM CHLORIDE 10 MEQ/100ML IV SOLN
10.0000 meq | INTRAVENOUS | Status: AC
  Administered 2013-09-08 (×6): 10 meq via INTRAVENOUS
  Filled 2013-09-08 (×6): qty 100

## 2013-09-08 MED ORDER — AMIODARONE IV BOLUS ONLY 150 MG/100ML
150.0000 mg | Freq: Once | INTRAVENOUS | Status: AC
  Administered 2013-09-08: 150 mg via INTRAVENOUS
  Filled 2013-09-08: qty 100

## 2013-09-08 MED ORDER — VANCOMYCIN 50 MG/ML ORAL SOLUTION
250.0000 mg | Freq: Four times a day (QID) | ORAL | Status: DC
  Administered 2013-09-08 – 2013-09-13 (×20): 250 mg via ORAL
  Filled 2013-09-08 (×25): qty 5

## 2013-09-08 MED ORDER — AMIODARONE HCL IN DEXTROSE 360-4.14 MG/200ML-% IV SOLN
60.0000 mg/h | INTRAVENOUS | Status: AC
  Administered 2013-09-08: 60 mg/h via INTRAVENOUS
  Filled 2013-09-08: qty 200

## 2013-09-08 MED ORDER — AMIODARONE HCL IN DEXTROSE 360-4.14 MG/200ML-% IV SOLN
30.0000 mg/h | INTRAVENOUS | Status: DC
  Administered 2013-09-08 – 2013-09-09 (×2): 30 mg/h via INTRAVENOUS
  Filled 2013-09-08 (×4): qty 200

## 2013-09-08 MED ORDER — HYDRALAZINE HCL 20 MG/ML IJ SOLN
10.0000 mg | Freq: Four times a day (QID) | INTRAMUSCULAR | Status: DC | PRN
  Administered 2013-09-09: 10 mg via INTRAVENOUS
  Filled 2013-09-08 (×2): qty 0.5

## 2013-09-08 NOTE — Progress Notes (Signed)
Pt. HR went back to 140-150's. Oncall M.D. Notified. Ordered Amiodarone 150mg  IV bolus. Followed by Amiodarone drip. Will continue to monitor.

## 2013-09-08 NOTE — Progress Notes (Signed)
Pt Hr rate increased to 130"s sustained x 1.5 mins. Then returned to 59-60. Dr. Kathlen Mody notified and no changes in orders.

## 2013-09-08 NOTE — Progress Notes (Signed)
Patient ID: Renee Donovan, female   DOB: 1930/11/23, 80 y.o.   MRN: [id number] [facility] PROGRESS NOTE  CALEY CIARAMITARO CWC:[id number] DOB: 1930-06-12 DOA: 09/04/2013 PCP: [name], MD  Brief narrative:  80 year old female with past medical history of COPD on 2 L home oxygen, osteoarthritis, from SNF who presented to [facility] ED 09/04/2013 with worsening nausea, vomiting, diarrhea and associated lower abdominal pain for past 5 days prior to this admission. On admission, patient was found to have possible perforated appendicitis for which surgery was consulted. In addition, patient had about 10 minute episode of wide complex tachycardia which has resolved with a bolus of amiodarone. Blood work was significant for leukocytosis of 14.7 and creatinine of 3.05. Cardiac enzymes were WNL.   Assessment and Plan:  Principal Problem:  Acute perforated appendicitis  CT abdomen on admission showed probable acute versus possibly early perforated appendicitis as evidenced by 2.9 x 2.3 cm inflammatory mass adjacent to the cecum. No abscess. Also seen is possible low grade small bowel obstruction or ileus from adjacent appendicitis.  Per surgery this is likely subacute appendicitis versus cecal diverticulitis.  Appreciate surgery consult and their recommendations. Continue non-operative management with bowel rest and IV abx.  Will continue Zosyn for g- coverage and flagyl for C. Diff and atypical bacterial coverage  Agree with surgery to add oral Vanc today June 14th, 2015  Active Problems:  Recurrent SVT  Episode overnight  Started amiodarone per cardio, appreciate input  COPD (chronic obstructive pulmonary disease) with emphysema / Acute respiratory failure with hypoxia  Stable, continue oxygen support via nasal canula to keep O2 saturation above 90%  Continue spiriva Acute on chronic kidney disease, stage 4  Most recent baseline creatinine around 1.8 and on this admission 3.05; likely in  the setting of acute perforated appendicitis. She was also on lisinopril for blood pressure which could have exacerbated renal failure. Lisinopril is on hold.  We will continue IV fluids, normal saline @ 75 cc/hr  Cr is trending down  Check BMP again in am Acute blood loss anemia  Likely dilutional, no signs of active bleeding, repeat CBC in AM  Hypokalemia  Supplement as indicated and repeat BMP in AM  Nausea, vomiting, diarrhea  C. Diff positive  Lipase level was WNL on this admission  Continue Flagyl 500 mg IV TID day #3/14  Add oral Vancomycin  Diastolic CHF, chronic  Last 2 D ECHO was in 06/2013 with preserved EF and grade 2 diastolic dysfunction  Appreciate cardiology following  Hypertension  Continue catapres 0.2 mg weekly, Imdur 120 mg daily and metoprolol 25 mg PO BID - add Hydralazine IV as needed   Consultants:  Cardiology  Surgery  Procedures/Studies:  Ct Abdomen Pelvis Wo Contras 09/05/2013 IMPRESSION: Probable acute versus possibly early perforated appendicitis as evidenced by 2.9 x 2.3 cm inflammatory mass adjacent to the cecum. No abscess or pneumoperitoneum. Low-grade partial small bowel obstruction or, ileus from adjacent appendiceal process. Bilaterals cystic adnexal masses, smaller on the left which would be atypical for neoplasm. Recommend follow-up ultrasound for further characterization. Small right pleural effusion with right lower lobe airspace opacity which may reflect atelectasis, less likely early pneumonia. Severe aortic atherosclerosis was similar multi segmental ectasia.  Dg Chest Portable 1 View 09/04/2013 IMPRESSION: No acute abnormality. Probable emphysema.  Antibiotics:  Zosyn 09/04/2013 -->  Flagyl 09/05/2013 --> Vancomycin PO 6/14 -->  Code Status: DNR/DNI  Family Communication: Updated daughter in law [place] at bedside, phone number [phone]  Disposition Plan: Remains inpatient   HPI/Subjective: No events overnight.   Objective: Filed Vitals:    09/08/13 0630 09/08/13 0723 09/08/13 0822 09/08/13 0916  BP: 157/77   186/80  Pulse:  50 55 59  Temp:      TempSrc:      Resp:    18  Height:      Weight: 69.2 kg (152 lb 8.9 oz)     SpO2:  100%  100%    Intake/Output Summary (Last 24 hours) at 09/08/13 1303 Last data filed at 09/08/13 0630  Gross per 24 hour  Intake   2035 ml  Output      0 ml  Net   2035 ml    Exam:   General:  Pt is alert, follows commands appropriately, not in acute distress  Cardiovascular: Regular rate and rhythm, S1/S2, no murmurs, no rubs, no gallops  Respiratory: Clear to auscultation bilaterally, no wheezing, no crackles, no rhonchi  Abdomen: Soft, non tender, non distended, bowel sounds present, no guarding  Data Reviewed: Basic Metabolic Panel:  Recent Labs Lab 09/04/13 1953 09/05/13 0751 09/06/13 0445 09/07/13 0600 09/08/13 0213  NA 138 140 141 139 139  K 3.7 3.5* 3.0* 2.9* 3.1*  CL 95* 98 104 105 105  CO2 24 26 25 20 19   GLUCOSE 134* 103* 68* 81 163*  BUN 42* 44* 41* 33* 25*  CREATININE 3.05* 3.00* 2.71* 1.97* 1.65*  CALCIUM 9.0 8.0* 7.8* 7.6* 7.5*  MG 1.7  --   --   --   --    Liver Function Tests:  Recent Labs Lab 09/04/13 1953  AST 17  ALT 7  ALKPHOS 75  BILITOT 0.7  PROT 6.8  ALBUMIN 2.5*    Recent Labs Lab 09/04/13 1953  LIPASE 16   CBC:  Recent Labs Lab 09/04/13 1953 09/05/13 0225 09/06/13 0445 09/07/13 0600 09/08/13 0213  WBC 14.7* 12.7* 13.3* 17.9* 19.3*  NEUTROABS 11.0*  --   --   --   --   HGB 13.5 13.2 10.3* 10.1* 11.3*  HCT 40.4 39.5 31.6* 30.3* 33.5*  MCV 89.8 90.2 92.1 91.0 89.8  PLT 510* 448* 345 355 377   Cardiac Enzymes:  Recent Labs Lab 09/04/13 1953 09/05/13 0225 09/05/13 0751 09/05/13 1354  TROPONINI <0.30 <0.30 <0.30 <0.30     Recent Results (from the past 240 hour(s))  MRSA PCR SCREENING     Status: None   Collection Time    09/05/13  1:48 AM      Result Value Ref Range Status   MRSA by PCR NEGATIVE  NEGATIVE Final    Comment:            The GeneXpert MRSA Assay (FDA     approved for NASAL specimens     only), is one component of a     comprehensive MRSA colonization     surveillance program. It is not     intended to diagnose MRSA     infection nor to guide or     monitor treatment for     MRSA infections.  CLOSTRIDIUM DIFFICILE BY PCR     Status: Abnormal   Collection Time    09/05/13  4:23 AM      Result Value Ref Range Status   C difficile by pcr POSITIVE (*) NEGATIVE Final   Comment: CRITICAL RESULT CALLED TO, READ BACK BY AND VERIFIED WITH:     WEST P RN 09/05/13 0859 COSTELLO B  Performed at [facility]     Scheduled Meds: . albuterol  2.5 mg Nebulization BID  . antiseptic oral rinse  15 mL Mouth Rinse q12n4p  . chlorhexidine  15 mL Mouth Rinse BID  . [START ON 09/09/2013] cloNIDine  0.2 mg Transdermal Weekly  . isosorbide mononitrate  120 mg Oral Daily  . metoprolol tartrate  25 mg Oral BID  . metronidazole  500 mg Intravenous 3 times per day  . piperacillin-tazobactam (ZOSYN)  IV  3.375 g Intravenous Q8H  . potassium chloride  10 mEq Intravenous Q1 Hr x 6  . tiotropium  18 mcg Inhalation Daily  . vancomycin  250 mg Oral 4 times per day   Continuous Infusions: . sodium chloride 50 mL/hr at 09/08/13 0630  . amiodarone 30 mg/hr (09/08/13 0800)   Faye Ramsay, MD  [facility] Pager [phone]  If 7PM-7AM, please contact night-coverage [email or web address] Password [email or web address] 09/08/2013, 1:03 PM   LOS: 4 days

## 2013-09-08 NOTE — Progress Notes (Signed)
ANTIBIOTIC CONSULT NOTE - FOLLOW UP  Pharmacy Consult for Zosyn Indication: Intra-abdominal infection  No Known Allergies  Patient Measurements: Height: 5\' 6"  (167.6 cm) Weight: 152 lb 8.9 oz (69.2 kg) IBW/kg (Calculated) : 59.3  Vital Signs: Temp: 98.2 F (36.8 C) (06/14 0530) Temp src: Oral (06/14 0530) BP: 157/77 mmHg (06/14 0630) Pulse Rate: 55 (06/14 0822) Intake/Output from previous day: 06/13 0701 - 06/14 0700 In: 2475 [P.O.:450; I.V.:1225; IV Piggyback:800] Out: -  Intake/Output from this shift:    Labs:  Recent Labs  09/06/13 0445 09/07/13 0600 09/08/13 0213  WBC 13.3* 17.9* 19.3*  HGB 10.3* 10.1* 11.3*  PLT 345 355 377  CREATININE 2.71* 1.97* 1.65*   Estimated Creatinine Clearance: 25 ml/min (by C-G formula based on Cr of 1.65). No results found for this basename: VANCOTROUGH, Corlis Leak, VANCORANDOM, [place], GENTPEAK, GENTRANDOM, TOBRATROUGH, TOBRAPEAK, TOBRARND, AMIKACINPEAK, AMIKACINTROU, AMIKACIN,  in the last 72 hours   Microbiology: Recent Results (from the past 720 hour(s))  MRSA PCR SCREENING     Status: None   Collection Time    09/05/13  1:48 AM      Result Value Ref Range Status   MRSA by PCR NEGATIVE  NEGATIVE Final   Comment:            The GeneXpert MRSA Assay (FDA     approved for NASAL specimens     only), is one component of a     comprehensive MRSA colonization     surveillance program. It is not     intended to diagnose MRSA     infection nor to guide or     monitor treatment for     MRSA infections.  CLOSTRIDIUM DIFFICILE BY PCR     Status: Abnormal   Collection Time    09/05/13  4:23 AM      Result Value Ref Range Status   C difficile by pcr POSITIVE (*) NEGATIVE Final   Comment: CRITICAL RESULT CALLED TO, READ BACK BY AND VERIFIED WITH:     WEST P RN 09/05/13 0859 COSTELLO B     Performed at [facility]    Assessment: 80yo female who presents to the emergency room with n/v/d x ~ 2 weeks. On day #4 Zosyn 2.25  Gm IV q6h, flagyl 500mg  IV q8h for subacute perforated appendicits vs cecal diverticulitis and CDiff. CCS plan is to continue non-op mgmnt with IV abx for now.  Anti-infectives: 6/11 >>zosyn >> 6/11 >>metronidazole >>   Labs / Vitals: Tmax: afeb WBCs: rising 19.3k Renal: AoCKD - SCr slowly improving, appears at baseline, CrCl 25 ml/min  Microbiology: 6/11: c. Diff: POSITIVE  Goal of Therapy:  Eradication of infection; Zosyn dose per renal function  Plan:   Adjust to Zosyn 3.375gm IV q8h (4hr extended infusions) Follow up renal function & cultures Follow up duration of therapy  Peggyann Juba, PharmD, BCPS Pager: [phone] 09/08/2013,9:09 AM

## 2013-09-08 NOTE — Progress Notes (Signed)
Dr. Kathlen Mody notified Hr 50-55 since 0510, rate on amiodarone decreased to 30mg /hr

## 2013-09-08 NOTE — Progress Notes (Signed)
PT Cancellation Note  Patient Details Name: LATAJAH THUMAN MRN: [id number] DOB: 1930-12-19   Cancelled Treatment:    Reason Eval/Treat Not Completed: Medical issues which prohibited therapy--RN recommended PT be held-pt on drip for HR. Will check back on tomorrow. Thanks.    Weston Anna, MPT Pager: [phone]

## 2013-09-08 NOTE — Progress Notes (Signed)
Subjective:  Had recurrent SVT last night that broke with intravenous amiodarone after beta blockers were unsuccessful. No complaints of shortness of breath or chest pain this morning.  Objective:  Vital Signs in the last 24 hours: BP 157/77  Pulse 55  Temp(Src) 98.2 F (36.8 C) (Oral)  Resp 19  Ht 5\' 6"  (1.676 m)  Wt 69.2 kg (152 lb 8.9 oz)  BMI 24.64 kg/m2  SpO2 100%  Physical Exam: Thin female laying in bed currently in no acute distress Lungs:  Clear , increased AP diameter Cardiac:  Regular rhythm, normal S1 and S2, no S3 Extremities:  No edema present, several excoriations noted on the toes  Intake/Output from previous day: 06/13 0701 - 06/14 0700 In: 4765 [P.O.:450; I.V.:1225; IV Piggyback:800] Out: -  Weight Filed Weights   09/06/13 0516 09/07/13 0521 09/08/13 0630  Weight: 67.2 kg (148 lb 2.4 oz) 67.8 kg (149 lb 7.6 oz) 69.2 kg (152 lb 8.9 oz)    Lab Results: Basic Metabolic Panel:  Recent Labs  09/07/13 0600 09/08/13 0213  NA 139 139  K 2.9* 3.1*  CL 105 105  CO2 20 19  GLUCOSE 81 163*  BUN 33* 25*  CREATININE 1.97* 1.65*    CBC:  Recent Labs  09/07/13 0600 09/08/13 0213  WBC 17.9* 19.3*  HGB 10.1* 11.3*  HCT 30.3* 33.5*  MCV 91.0 89.8  PLT 355 377    BNP    Component Value Date/Time   PROBNP 4353.0* 07/25/2013 0308    PROTIME: Lab Results  Component Value Date   INR 1.54* 09/05/2013    Telemetry: Currently normal sinus rhythm, stretching last night showed a sustained supraventricular tachycardia that broke amiodarone this morning  Assessment/Plan:  1. Recurrent supraventricular tachycardia possible atrial flutter with 21 block 2. Chronic diastolic dysfunction asymptomatic 3. Currently significant subacute appendicitis or Dr. a lot of sputum managed with bowel rest 4. Stage III 4 chronic kidney disease  Recommendations:  Continue IV amiodarone for the time being since she is n.p.o. to prevent recurrence. May be able to manage  with AV nodal blockers when she is able to take by mouth.     Kerry Hough  MD [facility]  09/08/2013, 8:29 AM

## 2013-09-08 NOTE — Progress Notes (Signed)
Pt. Had runs of Vtach converted to Sustaining SVT 150-160's. Oncall M.D. Informed. Ordered 2.41ml Metoprolol x 2. Did not work. Oncall M.D. Notified and ordered Amiodarone 150mg  IV bolus. After 15 mins Pt. Rhythm converted to NSR 60-70 sustaining.M.D. Informed   No new orders. Will continue to monitor.

## 2013-09-08 NOTE — Progress Notes (Signed)
Called by nurse about patient developing SVT.  Cardiology had initially consulted for [facility] which was felt secondary to SVT with aberration and broke with IV Amio in the ER.  Dr. Burt Knack saw on Friday and signed off due to no further WCT.  2D echo showed normal LVF.  She went into narrow complex SVT at 150bpm.  EKG reviewed.  May be atrial flutter with 2:1 AV block vs. AVNRT.  Lopressor 5mg  IV given with no improvement.  Will try Amio 150mg  IV bolus now. She was very hypokalemic this am so will repeat a stat BMET along with Mg.

## 2013-09-08 NOTE — Progress Notes (Signed)
  Subjective: She feels about the same as yesterday  Objective: Vital signs in last 24 hours: Temp:  [97.8 F (36.6 C)-98.2 F (36.8 C)] 98.2 F (36.8 C) (06/14 0530) Pulse Rate:  [50-153] 59 (06/14 0916) Resp:  [18-20] 18 (06/14 0916) BP: (124-186)/(60-97) 186/80 mmHg (06/14 0916) SpO2:  [97 %-100 %] 100 % (06/14 0916) Weight:  [152 lb 8.9 oz (69.2 kg)] 152 lb 8.9 oz (69.2 kg) (06/14 0630) Last BM Date: 09/07/13  Intake/Output from previous day: 06/13 0701 - 06/14 0700 In: 2475 [P.O.:450; I.V.:1225; IV Piggyback:800] Out: -  Intake/Output this shift:    Resp: clear to auscultation bilaterally Cardio: regular rate and rhythm GI: soft on right. slightly more tender on left. no guarding or peritonitis  Lab Results:   Recent Labs  09/07/13 0600 09/08/13 0213  WBC 17.9* 19.3*  HGB 10.1* 11.3*  HCT 30.3* 33.5*  PLT 355 377   BMET  Recent Labs  09/07/13 0600 09/08/13 0213  NA 139 139  K 2.9* 3.1*  CL 105 105  CO2 20 19  GLUCOSE 81 163*  BUN 33* 25*  CREATININE 1.97* 1.65*  CALCIUM 7.6* 7.5*   PT/INR No results found for this basename: LABPROT, INR,  in the last 72 hours ABG No results found for this basename: PHART, PCO2, PO2, HCO3,  in the last 72 hours  Studies/Results: No results found.  Anti-infectives: Anti-infectives   Start     Dose/Rate Route Frequency Ordered Stop   09/08/13 1400  piperacillin-tazobactam (ZOSYN) IVPB 3.375 g     3.375 g 12.5 mL/hr over 240 Minutes Intravenous Every 8 hours 09/08/13 0915     09/08/13 1200  vancomycin (VANCOCIN) 50 mg/mL oral solution 250 mg     250 mg Oral 4 times per day 09/08/13 1016     09/05/13 1000  metroNIDAZOLE (FLAGYL) IVPB 500 mg     500 mg 100 mL/hr over 60 Minutes Intravenous 3 times per day 09/05/13 0834     09/05/13 0930  metroNIDAZOLE (FLAGYL) IVPB 500 mg  Status:  Discontinued     500 mg 100 mL/hr over 60 Minutes Intravenous Every 8 hours 09/05/13 0927 09/05/13 0939   09/05/13 0800   piperacillin-tazobactam (ZOSYN) IVPB 2.25 g  Status:  Discontinued     2.25 g 100 mL/hr over 30 Minutes Intravenous Every 6 hours 09/05/13 0213 09/08/13 0915   09/05/13 0030  piperacillin-tazobactam (ZOSYN) IVPB 3.375 g     3.375 g 100 mL/hr over 30 Minutes Intravenous  Once 09/05/13 0028 09/05/13 0128      Assessment/Plan: s/p * No surgery found * Continue Zosyn for perforated appendix Continue IV flagyl and add po vanc for C. Diff Npo except meds  LOS: 4 days    TOTH III,Janise Gora S 09/08/2013

## 2013-09-09 DIAGNOSIS — I472 Ventricular tachycardia: Secondary | ICD-10-CM | POA: Diagnosis not present

## 2013-09-09 DIAGNOSIS — R000 Tachycardia, unspecified: Secondary | ICD-10-CM | POA: Diagnosis not present

## 2013-09-09 LAB — CBC
HCT: 34.3 % — ABNORMAL LOW (ref 36.0–46.0)
Hemoglobin: 11.5 g/dL — ABNORMAL LOW (ref 12.0–15.0)
MCH: 29.9 pg (ref 26.0–34.0)
MCHC: 33.5 g/dL (ref 30.0–36.0)
MCV: 89.3 fL (ref 78.0–100.0)
Platelets: 354 10*3/uL (ref 150–400)
RBC: 3.84 MIL/uL — ABNORMAL LOW (ref 3.87–5.11)
RDW: 14.3 % (ref 11.5–15.5)
WBC: 19.8 10*3/uL — ABNORMAL HIGH (ref 4.0–10.5)

## 2013-09-09 LAB — COMPREHENSIVE METABOLIC PANEL
ALK PHOS: 75 U/L (ref 39–117)
ALT: 5 U/L (ref 0–35)
AST: 10 U/L (ref 0–37)
Albumin: 1.6 g/dL — ABNORMAL LOW (ref 3.5–5.2)
BUN: 19 mg/dL (ref 6–23)
CO2: 18 meq/L — AB (ref 19–32)
Calcium: 7.6 mg/dL — ABNORMAL LOW (ref 8.4–10.5)
Chloride: 109 mEq/L (ref 96–112)
Creatinine, Ser: 1.46 mg/dL — ABNORMAL HIGH (ref 0.50–1.10)
GFR, EST AFRICAN AMERICAN: 38 mL/min — AB (ref >90)
GFR, EST NON AFRICAN AMERICAN: 33 mL/min — AB (ref >90)
Glucose, Bld: 76 mg/dL (ref 70–99)
POTASSIUM: 3.7 meq/L (ref 3.7–5.3)
SODIUM: 140 meq/L (ref 137–147)
Total Bilirubin: 0.2 mg/dL — ABNORMAL LOW (ref 0.3–1.2)
Total Protein: 4.8 g/dL — ABNORMAL LOW (ref 6.0–8.3)

## 2013-09-09 MED ORDER — HYDRALAZINE HCL 25 MG PO TABS
25.0000 mg | ORAL_TABLET | Freq: Three times a day (TID) | ORAL | Status: DC
Start: 2013-09-09 — End: 2013-09-23
  Administered 2013-09-09 – 2013-09-23 (×40): 25 mg via ORAL
  Filled 2013-09-09 (×45): qty 1

## 2013-09-09 MED ORDER — AMIODARONE HCL 200 MG PO TABS
400.0000 mg | ORAL_TABLET | Freq: Two times a day (BID) | ORAL | Status: DC
  Administered 2013-09-09 – 2013-09-11 (×6): 400 mg via ORAL
  Filled 2013-09-09 (×8): qty 2

## 2013-09-09 NOTE — Progress Notes (Signed)
Patient ID: Renee Donovan, female   DOB: 04-22-1930, 80 y.o.   MRN: [id number]  [facility] PROGRESS NOTE  SAWSAN RIGGIO EPP:[id number] DOB: 1931/03/18 DOA: 09/04/2013 PCP: [name], MD  Brief narrative:  80 year old female with past medical history of COPD on 2 L home oxygen, osteoarthritis, from SNF who presented to [facility] ED 09/04/2013 with worsening nausea, vomiting, diarrhea and associated lower abdominal pain for past 5 days prior to this admission. On admission, patient was found to have possible perforated appendicitis for which surgery was consulted. In addition, patient had about 10 minute episode of wide complex tachycardia which has resolved with a bolus of amiodarone. Blood work was significant for leukocytosis of 14.7 and creatinine of 3.05. Cardiac enzymes were WNL.   Assessment and Plan:  Principal Problem:  Acute perforated appendicitis  CT abdomen on admission showed probable acute versus possibly early perforated appendicitis as evidenced by 2.9 x 2.3 cm inflammatory mass adjacent to the cecum. No abscess. Also seen is possible low grade small bowel obstruction or ileus from adjacent appendicitis.  Per surgery this is likely subacute appendicitis versus cecal diverticulitis.  Appreciate surgery consult and their recommendations. Continue non-operative management with bowel rest and IV abx.  Will continue Zosyn for g- coverage and flagyl for C. Diff and atypical bacterial coverage  Agree with surgery to add oral Vanc June 14th, 2015  Active Problems:  Recurrent SVT  Episode overnight 6/14 Started amiodarone per cardio, appreciate input  COPD (chronic obstructive pulmonary disease) with emphysema / Acute respiratory failure with hypoxia  Stable, continue oxygen support via nasal canula to keep O2 saturation above 90%  Continue spiriva Acute on chronic kidney disease, stage 4  Most recent baseline creatinine around 1.8 and on this admission 3.05; likely in the  setting of acute perforated appendicitis. She was also on lisinopril for blood pressure which could have exacerbated renal failure. Lisinopril is on hold.  We will continue IV fluids, NS Cr is trending down  Check BMP again in am Acute blood loss anemia  Likely dilutional, no signs of active bleeding, repeat CBC in AM  Hypokalemia  Supplement as indicated and repeat BMP in AM  Nausea, vomiting, diarrhea  C. Diff positive  Lipase level was WNL on this admission  Continue Flagyl 500 mg IV TID day #4/14, oral vanc day #2 Diastolic CHF, chronic  Last 2 D ECHO was in 06/2013 with preserved EF and grade 2 diastolic dysfunction  Appreciate cardiology following  Hypertension  Continue catapres 0.2 mg weekly, Imdur 120 mg daily and metoprolol 25 mg PO BID  Add Hydralazine IV as needed and scheduled PO   Consultants:  Cardiology  Surgery  Procedures/Studies:  Ct Abdomen Pelvis Wo Contras 09/05/2013 IMPRESSION: Probable acute versus possibly early perforated appendicitis as evidenced by 2.9 x 2.3 cm inflammatory mass adjacent to the cecum. No abscess or pneumoperitoneum. Low-grade partial small bowel obstruction or, ileus from adjacent appendiceal process. Bilaterals cystic adnexal masses, smaller on the left which would be atypical for neoplasm. Recommend follow-up ultrasound for further characterization. Small right pleural effusion with right lower lobe airspace opacity which may reflect atelectasis, less likely early pneumonia. Severe aortic atherosclerosis was similar multi segmental ectasia.  Dg Chest Portable 1 View 09/04/2013 IMPRESSION: No acute abnormality. Probable emphysema.  Antibiotics:  Zosyn 09/04/2013 -->  Flagyl 09/05/2013 -->  Vancomycin PO 6/14 -->  Code Status: DNR/DNI  Family Communication: Updated daughter in law Yorklyn at bedside, phone number [phone]  Disposition  Plan: Remains inpatient   HPI/Subjective: No events overnight.   Objective: Filed Vitals:   09/08/13 1512  09/08/13 2059 09/09/13 0500 09/09/13 0618  BP: 151/56 164/65  191/72  Pulse: 66 58  55  Temp: 98.2 F (36.8 C) 97.9 F (36.6 C)  97.7 F (36.5 C)  TempSrc: Oral Oral  Oral  Resp: 18 20  20   Height:      Weight:   69.3 kg (152 lb 12.5 oz)   SpO2: 98% 100%  100%    Intake/Output Summary (Last 24 hours) at 09/09/13 0738 Last data filed at 09/09/13 1308  Gross per 24 hour  Intake 2200.5 ml  Output     54 ml  Net 2146.5 ml    Exam:   General:  Pt is alert, follows commands appropriately, not in acute distress  Cardiovascular: Regular rate and rhythm, S1/S2, no murmurs, no rubs, no gallops  Respiratory: Clear to auscultation bilaterally, no wheezing, diminished breath sounds at bases   Abdomen: Soft, tender in epigastric area, non distended, bowel sounds present, no guarding  Extremities: No edema, pulses DP and PT palpable bilaterally  Neuro: Grossly nonfocal  Data Reviewed: Basic Metabolic Panel:  Recent Labs Lab 09/04/13 1953 09/05/13 0751 09/06/13 0445 09/07/13 0600 09/08/13 0213 09/09/13 0335  NA 138 140 141 139 139 140  K 3.7 3.5* 3.0* 2.9* 3.1* 3.7  CL 95* 98 104 105 105 109  CO2 24 26 25 20 19  18*  GLUCOSE 134* 103* 68* 81 163* 76  BUN 42* 44* 41* 33* 25* 19  CREATININE 3.05* 3.00* 2.71* 1.97* 1.65* 1.46*  CALCIUM 9.0 8.0* 7.8* 7.6* 7.5* 7.6*  MG 1.7  --   --   --   --   --    Liver Function Tests:  Recent Labs Lab 09/04/13 1953 09/09/13 0335  AST 17 10  ALT 7 5  ALKPHOS 75 75  BILITOT 0.7 0.2*  PROT 6.8 4.8*  ALBUMIN 2.5* 1.6*    Recent Labs Lab 09/04/13 1953  LIPASE 16   CBC:  Recent Labs Lab 09/04/13 1953 09/05/13 0225 09/06/13 0445 09/07/13 0600 09/08/13 0213 09/09/13 0335  WBC 14.7* 12.7* 13.3* 17.9* 19.3* 19.8*  NEUTROABS 11.0*  --   --   --   --   --   HGB 13.5 13.2 10.3* 10.1* 11.3* 11.5*  HCT 40.4 39.5 31.6* 30.3* 33.5* 34.3*  MCV 89.8 90.2 92.1 91.0 89.8 89.3  PLT 510* 448* 345 355 377 354   Cardiac  Enzymes:  Recent Labs Lab 09/04/13 1953 09/05/13 0225 09/05/13 0751 09/05/13 1354  TROPONINI <0.30 <0.30 <0.30 <0.30   Recent Results (from the past 240 hour(s))  MRSA PCR SCREENING     Status: None   Collection Time    09/05/13  1:48 AM      Result Value Ref Range Status   MRSA by PCR NEGATIVE  NEGATIVE Final   Comment:            The GeneXpert MRSA Assay (FDA     approved for NASAL specimens     only), is one component of a     comprehensive MRSA colonization     surveillance program. It is not     intended to diagnose MRSA     infection nor to guide or     monitor treatment for     MRSA infections.  CLOSTRIDIUM DIFFICILE BY PCR     Status: Abnormal   Collection Time  09/05/13  4:23 AM      Result Value Ref Range Status   C difficile by pcr POSITIVE (*) NEGATIVE Final   Comment: CRITICAL RESULT CALLED TO, READ BACK BY AND VERIFIED WITH:     WEST P RN 09/05/13 0859 COSTELLO B     Performed at [facility]     Scheduled Meds: . antiseptic oral rinse  15 mL Mouth Rinse q12n4p  . chlorhexidine  15 mL Mouth Rinse BID  . cloNIDine  0.2 mg Transdermal Weekly  . isosorbide mononitrate  120 mg Oral Daily  . levalbuterol  1.25 mg Nebulization TID  . metoprolol tartrate  25 mg Oral BID  . metronidazole  500 mg Intravenous 3 times per day  . piperacillin-tazobactam (ZOSYN)  IV  3.375 g Intravenous Q8H  . tiotropium  18 mcg Inhalation Daily  . vancomycin  250 mg Oral 4 times per day   Continuous Infusions: . sodium chloride 50 mL/hr at 09/08/13 0630  . amiodarone 30 mg/hr (09/09/13 0050)     Faye Ramsay, MD  [facility] Pager [phone]  If 7PM-7AM, please contact night-coverage [email or web address] Password [facility] 09/09/2013, 7:38 AM   LOS: 5 days

## 2013-09-09 NOTE — Progress Notes (Signed)
WBC from C dif.  Not complaining of pain.  Kaylyn Lim

## 2013-09-09 NOTE — Progress Notes (Addendum)
    Subjective:  No dyspnea or palpitations.   Objective:  Vital Signs in the last 24 hours: Temp:  [97.7 F (36.5 C)-98.2 F (36.8 C)] 97.7 F (36.5 C) (06/15 0618) Pulse Rate:  [55-66] 55 (06/15 0618) Resp:  [18-20] 20 (06/15 0618) BP: (151-191)/(56-80) 191/72 mmHg (06/15 0618) SpO2:  [98 %-100 %] 100 % (06/15 0618) Weight:  [152 lb 12.5 oz (69.3 kg)] 152 lb 12.5 oz (69.3 kg) (06/15 0500)  Intake/Output from previous day: 06/14 0701 - 06/15 0700 In: 2200.5 [I.V.:1050.5; IV Piggyback:1150] Out: 54 [Urine:3; Stool:51]  Physical Exam: Pt is somnolent but easily arousable. NAD. HEENT: normal Neck: JVP - supple Lungs: CTA bilaterally CV: RRR without murmur or gallop Abd: soft, NT, ND Ext: no edema Skin: warm/dry no rash   Lab Results:  Recent Labs  09/08/13 0213 09/09/13 0335  WBC 19.3* 19.8*  HGB 11.3* 11.5*  PLT 377 354    Recent Labs  09/08/13 0213 09/09/13 0335  NA 139 140  K 3.1* 3.7  CL 105 109  CO2 19 18*  GLUCOSE 163* 76  BUN 25* 19  CREATININE 1.65* 1.46*    Cardiac Studies: 2D Echo: Study Conclusions  - Left ventricle: The cavity size was normal. There was mild focal basal hypertrophy of the septum. Systolic function was normal. The estimated ejection fraction was in the range of 60% to 65%. Wall motion was normal; there were no regional wall motion abnormalities. There was an increased relative contribution of atrial contraction to ventricular filling. Doppler parameters are consistent with abnormal left ventricular relaxation (grade 1 diastolic dysfunction). - Mitral valve: There was trivial regurgitation.  Tele: Personally reviewed - sinus rhythm   Assessment/Plan:  1. Wide Complex Tachycardia  2. HFpEF  3. HTN  4. COPD  5. Perforated appendicitis 6. Adnexal cystic lesions  Heart rhythm stable in sinus without further wide-complex tachycardia. LVEF normal by echo. Continue beta-bblocker at current dose. Change amiodarone to  400 BID and change to 200 mg daily at DC; continue for 8 weeks and then DC (hopefully arrhythmias are related to ongoing abdominal process). Will need to resume BP meds and low dose diuretic at DC (note verapamil DCed and lopressor added). Will need abdominal ultrasound at some point for adnexal cystic lesions; will leave to primary care. She will need close FU with Dr Percival Spanish following DC.  Kirk Ruths, M.D. 09/09/2013, 7:34 AM

## 2013-09-09 NOTE — Progress Notes (Signed)
Patient ID: Renee Donovan, female   DOB: December 26, 1930, 80 y.o.   MRN: [id number]    Subjective: Pt feels ok. No abdominal complaints.  NPO  Objective: Vital signs in last 24 hours: Temp:  [97.7 F (36.5 C)-98.2 F (36.8 C)] 97.7 F (36.5 C) (06/15 0618) Pulse Rate:  [55-66] 55 (06/15 0618) Resp:  [18-20] 20 (06/15 0618) BP: (151-191)/(56-72) 191/72 mmHg (06/15 0618) SpO2:  [98 %-100 %] 100 % (06/15 0749) Weight:  [152 lb 12.5 oz (69.3 kg)] 152 lb 12.5 oz (69.3 kg) (06/15 0500) Last BM Date: 09/08/13  Intake/Output from previous day: 06/14 0701 - 06/15 0700 In: 2200.5 [I.V.:1050.5; IV Piggyback:1150] Out: 54 [Urine:3; Stool:51] Intake/Output this shift:    PE: Abd: soft, tender in LUQ, +BS, ND  Lab Results:   Recent Labs  09/08/13 0213 09/09/13 0335  WBC 19.3* 19.8*  HGB 11.3* 11.5*  HCT 33.5* 34.3*  PLT 377 354   BMET  Recent Labs  09/08/13 0213 09/09/13 0335  NA 139 140  K 3.1* 3.7  CL 105 109  CO2 19 18*  GLUCOSE 163* 76  BUN 25* 19  CREATININE 1.65* 1.46*  CALCIUM 7.5* 7.6*   PT/INR No results found for this basename: LABPROT, INR,  in the last 72 hours CMP     Component Value Date/Time   NA 140 09/09/2013 0335   K 3.7 09/09/2013 0335   CL 109 09/09/2013 0335   CO2 18* 09/09/2013 0335   GLUCOSE 76 09/09/2013 0335   BUN 19 09/09/2013 0335   CREATININE 1.46* 09/09/2013 0335   CALCIUM 7.6* 09/09/2013 0335   PROT 4.8* 09/09/2013 0335   ALBUMIN 1.6* 09/09/2013 0335   AST 10 09/09/2013 0335   ALT 5 09/09/2013 0335   ALKPHOS 75 09/09/2013 0335   BILITOT 0.2* 09/09/2013 0335   GFRNONAA 33* 09/09/2013 0335   GFRAA 38* 09/09/2013 0335   Lipase     Component Value Date/Time   LIPASE 16 09/04/2013 1953       Studies/Results: No results found.  Anti-infectives: Anti-infectives   Start     Dose/Rate Route Frequency Ordered Stop   09/08/13 1400  piperacillin-tazobactam (ZOSYN) IVPB 3.375 g     3.375 g 12.5 mL/hr over 240 Minutes Intravenous Every 8  hours 09/08/13 0915     09/08/13 1200  vancomycin (VANCOCIN) 50 mg/mL oral solution 250 mg     250 mg Oral 4 times per day 09/08/13 1016     09/05/13 1000  metroNIDAZOLE (FLAGYL) IVPB 500 mg     500 mg 100 mL/hr over 60 Minutes Intravenous 3 times per day 09/05/13 0834     09/05/13 0930  metroNIDAZOLE (FLAGYL) IVPB 500 mg  Status:  Discontinued     500 mg 100 mL/hr over 60 Minutes Intravenous Every 8 hours 09/05/13 0927 09/05/13 0939   09/05/13 0800  piperacillin-tazobactam (ZOSYN) IVPB 2.25 g  Status:  Discontinued     2.25 g 100 mL/hr over 30 Minutes Intravenous Every 6 hours 09/05/13 0213 09/08/13 0915   09/05/13 0030  piperacillin-tazobactam (ZOSYN) IVPB 3.375 g     3.375 g 100 mL/hr over 30 Minutes Intravenous  Once 09/05/13 0028 09/05/13 0128       Assessment/Plan  1. Acute perforated appendicitis, with phlegmon 2. c diff colitis 3. Leukocytosis  Plan: 1. Cont abx therapy 2. Cont npo, while WBC up 3. Follow WBC 4. No need for surgical intervention at this time.  Will follow   LOS: 5 days  Ryana Montecalvo E 09/09/2013, 9:56 AM Pager: [phone]

## 2013-09-09 NOTE — Progress Notes (Signed)
UR COMPLETED  

## 2013-09-09 NOTE — Progress Notes (Signed)
Pt had a rectal tube, but it came out during the night. RN and tech cleaned patient's perineum. Pt refused new rectal tube saying it hurt too much. Will inform day shift and continue to monitor.

## 2013-09-09 NOTE — Progress Notes (Signed)
PT Cancellation Note  Patient Details Name: Renee Donovan MRN: [id number] DOB: 1930/08/27   Cancelled Treatment:    Reason Eval/Treat Not Completed: Patient declined, no reason specified (Pt stated, "Oh, good Lord, no. I'm too tired". ) Will follow.    Renee Donovan 09/09/2013, 11:04 AM [phone]

## 2013-09-10 ENCOUNTER — Inpatient Hospital Stay (HOSPITAL_COMMUNITY): Payer: Medicare Other

## 2013-09-10 DIAGNOSIS — D72829 Elevated white blood cell count, unspecified: Secondary | ICD-10-CM

## 2013-09-10 LAB — GLUCOSE, CAPILLARY
Glucose-Capillary: 159 mg/dL — ABNORMAL HIGH (ref 70–99)
Glucose-Capillary: 66 mg/dL — ABNORMAL LOW (ref 70–99)

## 2013-09-10 LAB — CBC
HEMATOCRIT: 35.2 % — AB (ref 36.0–46.0)
Hemoglobin: 11.4 g/dL — ABNORMAL LOW (ref 12.0–15.0)
MCH: 29.2 pg (ref 26.0–34.0)
MCHC: 32.4 g/dL (ref 30.0–36.0)
MCV: 90.3 fL (ref 78.0–100.0)
Platelets: 376 10*3/uL (ref 150–400)
RBC: 3.9 MIL/uL (ref 3.87–5.11)
RDW: 14.7 % (ref 11.5–15.5)
WBC: 21.4 10*3/uL — AB (ref 4.0–10.5)

## 2013-09-10 LAB — BASIC METABOLIC PANEL
BUN: 17 mg/dL (ref 6–23)
CHLORIDE: 110 meq/L (ref 96–112)
CO2: 18 meq/L — AB (ref 19–32)
Calcium: 7.9 mg/dL — ABNORMAL LOW (ref 8.4–10.5)
Creatinine, Ser: 1.49 mg/dL — ABNORMAL HIGH (ref 0.50–1.10)
GFR calc Af Amer: 37 mL/min — ABNORMAL LOW (ref >90)
GFR calc non Af Amer: 32 mL/min — ABNORMAL LOW (ref >90)
Glucose, Bld: 74 mg/dL (ref 70–99)
Potassium: 3.4 mEq/L — ABNORMAL LOW (ref 3.7–5.3)
Sodium: 143 mEq/L (ref 137–147)

## 2013-09-10 MED ORDER — DEXTROSE 50 % IV SOLN
INTRAVENOUS | Status: AC
  Administered 2013-09-10: 50 mL
  Filled 2013-09-10: qty 50

## 2013-09-10 MED ORDER — ALBUTEROL SULFATE (2.5 MG/3ML) 0.083% IN NEBU
2.5000 mg | INHALATION_SOLUTION | Freq: Two times a day (BID) | RESPIRATORY_TRACT | Status: DC
  Administered 2013-09-10 – 2013-09-23 (×20): 2.5 mg via RESPIRATORY_TRACT
  Filled 2013-09-10 (×22): qty 3

## 2013-09-10 MED ORDER — DEXTROSE 50 % IV SOLN
50.0000 mL | Freq: Once | INTRAVENOUS | Status: AC
  Administered 2013-09-10: 50 mL via INTRAVENOUS

## 2013-09-10 MED ORDER — IOHEXOL 300 MG/ML  SOLN
25.0000 mL | INTRAMUSCULAR | Status: AC
  Administered 2013-09-10 (×2): 25 mL via ORAL

## 2013-09-10 MED ORDER — ALBUTEROL SULFATE (2.5 MG/3ML) 0.083% IN NEBU: 2.5000 mg | INHALATION_SOLUTION | RESPIRATORY_TRACT | Status: DC | PRN

## 2013-09-10 MED ORDER — DEXTROSE 50 % IV SOLN: 1.0000 | Freq: Once | INTRAVENOUS | Status: DC

## 2013-09-10 MED ORDER — IOHEXOL 300 MG/ML  SOLN: 100.0000 mL | Freq: Once | INTRAMUSCULAR | Status: DC | PRN

## 2013-09-10 NOTE — Progress Notes (Signed)
PT Cancellation Note  Patient Details Name: Renee Donovan MRN: [id number] DOB: [birth year]   Cancelled Treatment:    Reason Eval/Treat Not Completed: Medical issues which prohibited therapy (Potassium is 3.4, pt has not yet had a dose of potassium. Will follow. )   Philomena Doheny 09/10/2013, 10:17 AM [phone]

## 2013-09-10 NOTE — Progress Notes (Signed)
CRITICAL VALUE ALERT  Critical value received:  CBG-66  Date of notification:  09/10/13   Time of notification:  14:03  Critical value read back: yes  Nurse who received alert:  Flonnie Hailstone  MD notified (1st page):  Dr. Doyle Askew  Time of first page:  14:03  MD notified (2nd page):  Time of second page:  Responding MD:  Dr. Doyle Askew  Time MD responded:  14:20

## 2013-09-10 NOTE — Progress Notes (Signed)
    Subjective:  No dyspnea or palpitations.   Objective:  Vital Signs in the last 24 hours: Temp:  [97.3 F (36.3 C)-98.1 F (36.7 C)] 98.1 F (36.7 C) (06/16 0426) Pulse Rate:  [57-69] 59 (06/16 0426) Resp:  [19-20] 20 (06/16 0426) BP: (130-189)/(57-87) 130/61 mmHg (06/16 0426) SpO2:  [96 %-100 %] 100 % (06/16 0426) Weight:  [154 lb 8.7 oz (70.1 kg)] 154 lb 8.7 oz (70.1 kg) (06/16 0426)  Intake/Output from previous day: 06/15 0701 - 06/16 0700 In: 2086.7 [P.O.:60; I.V.:1576.7; IV Piggyback:450] Out: 2 [Urine:1; Stool:1]  Physical Exam: Pt is somnolent but easily arousable. NAD. HEENT: normal Neck: JVP - supple Lungs: CTA bilaterally CV: RRR without murmur or gallop Abd: soft, NT, ND Ext: no edema Skin: warm/dry no rash   Lab Results:  Recent Labs  09/09/13 0335 09/10/13 0348  WBC 19.8* 21.4*  HGB 11.5* 11.4*  PLT 354 376    Recent Labs  09/09/13 0335 09/10/13 0348  NA 140 143  K 3.7 3.4*  CL 109 110  CO2 18* 18*  GLUCOSE 76 74  BUN 19 17  CREATININE 1.46* 1.49*    Cardiac Studies: 2D Echo: Study Conclusions  - Left ventricle: The cavity size was normal. There was mild focal basal hypertrophy of the septum. Systolic function was normal. The estimated ejection fraction was in the range of 60% to 65%. Wall motion was normal; there were no regional wall motion abnormalities. There was an increased relative contribution of atrial contraction to ventricular filling. Doppler parameters are consistent with abnormal left ventricular relaxation (grade 1 diastolic dysfunction). - Mitral valve: There was trivial regurgitation.  Tele: Off telemetry  Assessment/Plan:  1. Wide Complex Tachycardia  2. HFpEF  3. HTN  4. COPD  5. Perforated appendicitis 6. Adnexal cystic lesions  Off telemetry; would resume for 24-48 hours given continuing appendicitis/hyperadrenergic state. LVEF normal by echo. Continue beta-blocker at current dose. Continue  amiodarone 400 BID and change to 200 mg daily at DC; continue for 8 weeks and then DC (hopefully arrhythmias are related to ongoing abdominal process). Will need to resume BP meds and low dose diuretic at DC (note verapamil DCed and lopressor added). Will need abdominal ultrasound at some point for adnexal cystic lesions; will leave to primary care. She will need close FU with Dr Percival Spanish following DC.  Kirk Ruths, M.D. 09/10/2013, 7:21 AM

## 2013-09-10 NOTE — Progress Notes (Signed)
Patient ID: Renee Donovan, female   DOB: May 03, 1930, 80 y.o.   MRN: [id number]  [facility] PROGRESS NOTE  HAILA DENA NFA:[id number] DOB: 1930/10/10 DOA: 09/04/2013 PCP: [name], MD  Brief narrative:  80 year old female with past medical history of COPD on 2 L home oxygen, osteoarthritis, from SNF who presented to [facility] ED 09/04/2013 with worsening nausea, vomiting, diarrhea and associated lower abdominal pain for past 5 days prior to this admission. On admission, patient was found to have possible perforated appendicitis for which surgery was consulted. In addition, patient had about 10 minute episode of wide complex tachycardia which has resolved with a bolus of amiodarone. Blood work was significant for leukocytosis of 14.7 and creatinine of 3.05. Cardiac enzymes were WNL.   Assessment and Plan:  Principal Problem:  Acute perforated appendicitis  CT abdomen on admission showed probable acute versus possibly early perforated appendicitis as evidenced by 2.9 x 2.3 cm inflammatory mass adjacent to the cecum. No abscess. Also seen is possible low grade small bowel obstruction or ileus from adjacent appendicitis.  Per surgery this is likely subacute appendicitis versus cecal diverticulitis.  Appreciate surgery consult and their recommendations. Continue non-operative management with bowel rest and IV abx.  Will continue Zosyn for g- coverage and flagyl for C. Diff and atypical bacterial coverage  Oral Vanc June 14th, 2015 per surgery but WBC continue trending up Plan for repeat CT abd 6/16, keep NPO for now until results of the CT abd are back  Continue to follow up on surgery team recommendations  Active Problems:  Recurrent SVT  Episode overnight 6/14  Started amiodarone per cardio, appreciate input  Continue to monitor on telemetry  LVEF is normal by ECHO Continue beta blocker, amiodarone 400 mg BID and change to 200 mg BID at discharge, recommendations is to continue  for 8 weeks and then d/c Will need close FU with Dr Percival Spanish following DC.  COPD (chronic obstructive pulmonary disease) with emphysema / Acute respiratory failure with hypoxia  Stable, continue oxygen support via nasal canula to keep O2 saturation above 90%  Continue spiriva Acute on chronic kidney disease, stage 4  Most recent baseline creatinine around 1.8 and on this admission 3.05; likely in the setting of acute perforated appendicitis. She was also on lisinopril for blood pressure which could have exacerbated renal failure. Lisinopril is on hold.  Cr is trending down overall but unchanged over the past 24 hours, 1.49 this AM Check BMP again in am Acute blood loss anemia  Likely dilutional, no signs of active bleeding, repeat CBC in AM  Hypokalemia  Supplement and repeat BMP in AM Nausea, vomiting, diarrhea  C. Diff positive  Lipase level was WNL on this admission  Continue Flagyl 500 mg IV TID day #5/14, oral vanc day #4/69 Diastolic CHF, chronic  Last 2 D ECHO was in 06/2013 with preserved EF and grade 2 diastolic dysfunction  Appreciate cardiology following  Hypertension  Continue catapres 0.2 mg weekly, Imdur 120 mg daily and metoprolol 25 mg PO BID  Add Hydralazine IV as needed and scheduled PO   Consultants:  Cardiology  Surgery  Procedures/Studies:  Ct Abdomen Pelvis Wo Contras 09/05/2013 IMPRESSION: Probable acute versus possibly early perforated appendicitis as evidenced by 2.9 x 2.3 cm inflammatory mass adjacent to the cecum. No abscess or pneumoperitoneum. Low-grade partial small bowel obstruction or, ileus from adjacent appendiceal process. Bilaterals cystic adnexal masses, smaller on the left which would be atypical for neoplasm. Recommend follow-up  ultrasound for further characterization. Small right pleural effusion with right lower lobe airspace opacity which may reflect atelectasis, less likely early pneumonia. Severe aortic atherosclerosis was similar multi segmental  ectasia.  Dg Chest Portable 1 View 09/04/2013 IMPRESSION: No acute abnormality. Probable emphysema.  Antibiotics:  Zosyn 09/04/2013 -->  Flagyl 09/05/2013 -->  Vancomycin PO 6/14 -->  Code Status: DNR/DNI  Family Communication: Updated daughter in law [place] at bedside, phone number [phone]  Disposition Plan: Remains inpatient   HPI/Subjective: No events overnight.   Objective: Filed Vitals:   09/10/13 0426 09/10/13 0814 09/10/13 1333 09/10/13 1353  BP: 130/61   124/60  Pulse: 59   56  Temp: 98.1 F (36.7 C)   97.9 F (36.6 C)  TempSrc: Oral   Axillary  Resp: 20   18  Height:      Weight: 70.1 kg (154 lb 8.7 oz)     SpO2: 100% 92% 96% 100%    Intake/Output Summary (Last 24 hours) at 09/10/13 1753 Last data filed at 09/10/13 1500  Gross per 24 hour  Intake 1720.83 ml  Output      0 ml  Net 1720.83 ml    Exam:   General:  Pt is alert, frail and overall weak, follows commands appropriately   Cardiovascular: Regular rhythm, bradycardic, S1/S2, no murmurs, no rubs, no gallops  Respiratory: Clear to auscultation bilaterally, no wheezing, diminished breath sounds at bases   Abdomen: Soft, tender in epigastric area, non distended, bowel sounds present, no guarding  Extremities: No edema, pulses DP and PT palpable bilaterally  Data Reviewed: Basic Metabolic Panel:  Recent Labs Lab 09/04/13 1953  09/06/13 0445 09/07/13 0600 09/08/13 0213 09/09/13 0335 09/10/13 0348  NA 138  < > 141 139 139 140 143  K 3.7  < > 3.0* 2.9* 3.1* 3.7 3.4*  CL 95*  < > 104 105 105 109 110  CO2 24  < > 25 20 19  18* 18*  GLUCOSE 134*  < > 68* 81 163* 76 74  BUN 42*  < > 41* 33* 25* 19 17  CREATININE 3.05*  < > 2.71* 1.97* 1.65* 1.46* 1.49*  CALCIUM 9.0  < > 7.8* 7.6* 7.5* 7.6* 7.9*  MG 1.7  --   --   --   --   --   --   < > = values in this interval not displayed. Liver Function Tests:  Recent Labs Lab 09/04/13 1953 09/09/13 0335  AST 17 10  ALT 7 5  ALKPHOS 75 75  BILITOT 0.7  0.2*  PROT 6.8 4.8*  ALBUMIN 2.5* 1.6*    Recent Labs Lab 09/04/13 1953  LIPASE 16   CBC:  Recent Labs Lab 09/04/13 1953  09/06/13 0445 09/07/13 0600 09/08/13 0213 09/09/13 0335 09/10/13 0348  WBC 14.7*  < > 13.3* 17.9* 19.3* 19.8* 21.4*  NEUTROABS 11.0*  --   --   --   --   --   --   HGB 13.5  < > 10.3* 10.1* 11.3* 11.5* 11.4*  HCT 40.4  < > 31.6* 30.3* 33.5* 34.3* 35.2*  MCV 89.8  < > 92.1 91.0 89.8 89.3 90.3  PLT 510*  < > 345 355 377 354 376  < > = values in this interval not displayed. Cardiac Enzymes:  Recent Labs Lab 09/04/13 1953 09/05/13 0225 09/05/13 0751 09/05/13 1354  TROPONINI <0.30 <0.30 <0.30 <0.30   CBG:  Recent Labs Lab 09/10/13 1341 09/10/13 1421  GLUCAP 66* 159*  Recent Results (from the past 240 hour(s))  MRSA PCR SCREENING     Status: None   Collection Time    09/05/13  1:48 AM      Result Value Ref Range Status   MRSA by PCR NEGATIVE  NEGATIVE Final   Comment:            The GeneXpert MRSA Assay (FDA     approved for NASAL specimens     only), is one component of a     comprehensive MRSA colonization     surveillance program. It is not     intended to diagnose MRSA     infection nor to guide or     monitor treatment for     MRSA infections.  CLOSTRIDIUM DIFFICILE BY PCR     Status: Abnormal   Collection Time    09/05/13  4:23 AM      Result Value Ref Range Status   C difficile by pcr POSITIVE (*) NEGATIVE Final   Comment: CRITICAL RESULT CALLED TO, READ BACK BY AND VERIFIED WITH:     WEST P RN 09/05/13 0859 COSTELLO B     Performed at [facility]     Scheduled Meds: . albuterol  2.5 mg Nebulization BID  . amiodarone  400 mg Oral BID  . antiseptic oral rinse  15 mL Mouth Rinse q12n4p  . chlorhexidine  15 mL Mouth Rinse BID  . cloNIDine  0.2 mg Transdermal Weekly  . hydrALAZINE  25 mg Oral 3 times per day  . isosorbide mononitrate  120 mg Oral Daily  . metoprolol tartrate  25 mg Oral BID  . metronidazole   500 mg Intravenous 3 times per day  . piperacillin-tazobactam (ZOSYN)  IV  3.375 g Intravenous Q8H  . tiotropium  18 mcg Inhalation Daily  . vancomycin  250 mg Oral 4 times per day   Continuous Infusions: . sodium chloride 50 mL/hr at 09/08/13 0630   Faye Ramsay, MD  [facility] Pager [phone]  If 7PM-7AM, please contact night-coverage [email or web address] Password [email or web address] 09/10/2013, 5:53 PM   LOS: 6 days

## 2013-09-10 NOTE — Progress Notes (Signed)
Patient ID: LUDIA GARTLAND, female   DOB: [birth year], 80 y.o.   MRN: [id number]    Subjective: no c/o abdominal pain today.    Objective: Vital signs in last 24 hours: Temp:  [97.3 F (36.3 C)-98.1 F (36.7 C)] 98.1 F (36.7 C) (06/16 0426) Pulse Rate:  [57-69] 59 (06/16 0426) Resp:  [19-20] 20 (06/16 0426) BP: (130-189)/(57-87) 130/61 mmHg (06/16 0426) SpO2:  [92 %-100 %] 92 % (06/16 0814) Weight:  [154 lb 8.7 oz (70.1 kg)] 154 lb 8.7 oz (70.1 kg) (06/16 0426) Last BM Date: 09/10/13  Intake/Output from previous day: 06/15 0701 - 06/16 0700 In: 2086.7 [P.O.:60; I.V.:1576.7; IV Piggyback:450] Out: 2 [Urine:1; Stool:1] Intake/Output this shift:    PE: Abd: soft, NT, ND, +BS  Lab Results:   Recent Labs  09/09/13 0335 09/10/13 0348  WBC 19.8* 21.4*  HGB 11.5* 11.4*  HCT 34.3* 35.2*  PLT 354 376   BMET  Recent Labs  09/09/13 0335 09/10/13 0348  NA 140 143  K 3.7 3.4*  CL 109 110  CO2 18* 18*  GLUCOSE 76 74  BUN 19 17  CREATININE 1.46* 1.49*  CALCIUM 7.6* 7.9*   PT/INR No results found for this basename: LABPROT, INR,  in the last 72 hours CMP     Component Value Date/Time   NA 143 09/10/2013 0348   K 3.4* 09/10/2013 0348   CL 110 09/10/2013 0348   CO2 18* 09/10/2013 0348   GLUCOSE 74 09/10/2013 0348   BUN 17 09/10/2013 0348   CREATININE 1.49* 09/10/2013 0348   CALCIUM 7.9* 09/10/2013 0348   PROT 4.8* 09/09/2013 0335   ALBUMIN 1.6* 09/09/2013 0335   AST 10 09/09/2013 0335   ALT 5 09/09/2013 0335   ALKPHOS 75 09/09/2013 0335   BILITOT 0.2* 09/09/2013 0335   GFRNONAA 32* 09/10/2013 0348   GFRAA 37* 09/10/2013 0348   Lipase     Component Value Date/Time   LIPASE 16 09/04/2013 1953       Studies/Results: No results found.  Anti-infectives: Anti-infectives   Start     Dose/Rate Route Frequency Ordered Stop   09/08/13 1400  piperacillin-tazobactam (ZOSYN) IVPB 3.375 g     3.375 g 12.5 mL/hr over 240 Minutes Intravenous Every 8 hours 09/08/13 0915     09/08/13 1200  vancomycin (VANCOCIN) 50 mg/mL oral solution 250 mg     250 mg Oral 4 times per day 09/08/13 1016     09/05/13 1000  metroNIDAZOLE (FLAGYL) IVPB 500 mg     500 mg 100 mL/hr over 60 Minutes Intravenous 3 times per day 09/05/13 0834     09/05/13 0930  metroNIDAZOLE (FLAGYL) IVPB 500 mg  Status:  Discontinued     500 mg 100 mL/hr over 60 Minutes Intravenous Every 8 hours 09/05/13 0927 09/05/13 0939   09/05/13 0800  piperacillin-tazobactam (ZOSYN) IVPB 2.25 g  Status:  Discontinued     2.25 g 100 mL/hr over 30 Minutes Intravenous Every 6 hours 09/05/13 0213 09/08/13 0915   09/05/13 0030  piperacillin-tazobactam (ZOSYN) IVPB 3.375 g     3.375 g 100 mL/hr over 30 Minutes Intravenous  Once 09/05/13 0028 09/05/13 0128       Assessment/Plan  1. Acute perforated appendicitis 2. c diff colitis 3. Leukocytosis, increasing  Plan: 1. Will repeat a CT scan today to evaluate a possible cause for increasing WBC given potentials for intra-abdominal sources 2. Cont NPO until this is completed. 3. Cont other current management with abx therapy.  LOS: 6 days    OSBORNE,KELLY E 09/10/2013, 8:59 AM Pager: [phone]

## 2013-09-10 NOTE — Progress Notes (Signed)
CSW following for return to [facility] when medically ready. CSW has completed FL2 & will continue to follow and assist with return.    Raynaldo Opitz, [facility] Clinical Social Worker cell #: [phone]

## 2013-09-11 DIAGNOSIS — E876 Hypokalemia: Secondary | ICD-10-CM | POA: Clinically undetermined

## 2013-09-11 DIAGNOSIS — A0472 Enterocolitis due to Clostridium difficile, not specified as recurrent: Secondary | ICD-10-CM | POA: Diagnosis present

## 2013-09-11 LAB — CBC
HCT: 32.1 % — ABNORMAL LOW (ref 36.0–46.0)
HEMOGLOBIN: 10.7 g/dL — AB (ref 12.0–15.0)
MCH: 29.8 pg (ref 26.0–34.0)
MCHC: 33.3 g/dL (ref 30.0–36.0)
MCV: 89.4 fL (ref 78.0–100.0)
Platelets: 325 10*3/uL (ref 150–400)
RBC: 3.59 MIL/uL — AB (ref 3.87–5.11)
RDW: 14.7 % (ref 11.5–15.5)
WBC: 16.8 10*3/uL — ABNORMAL HIGH (ref 4.0–10.5)

## 2013-09-11 LAB — COMPREHENSIVE METABOLIC PANEL
ALT: 5 U/L (ref 0–35)
AST: 12 U/L (ref 0–37)
Albumin: 1.4 g/dL — ABNORMAL LOW (ref 3.5–5.2)
Alkaline Phosphatase: 66 U/L (ref 39–117)
BILIRUBIN TOTAL: 0.4 mg/dL (ref 0.3–1.2)
BUN: 16 mg/dL (ref 6–23)
CALCIUM: 7.5 mg/dL — AB (ref 8.4–10.5)
CHLORIDE: 113 meq/L — AB (ref 96–112)
CO2: 16 meq/L — AB (ref 19–32)
CREATININE: 1.47 mg/dL — AB (ref 0.50–1.10)
GFR calc non Af Amer: 32 mL/min — ABNORMAL LOW (ref >90)
GFR, EST AFRICAN AMERICAN: 37 mL/min — AB (ref >90)
Glucose, Bld: 67 mg/dL — ABNORMAL LOW (ref 70–99)
Potassium: 3.2 mEq/L — ABNORMAL LOW (ref 3.7–5.3)
Sodium: 143 mEq/L (ref 137–147)
Total Protein: 4.4 g/dL — ABNORMAL LOW (ref 6.0–8.3)

## 2013-09-11 LAB — MAGNESIUM: Magnesium: 1.4 mg/dL — ABNORMAL LOW (ref 1.5–2.5)

## 2013-09-11 MED ORDER — SODIUM BICARBONATE 650 MG PO TABS
650.0000 mg | ORAL_TABLET | Freq: Two times a day (BID) | ORAL | Status: AC
  Administered 2013-09-11 (×2): 650 mg via ORAL
  Filled 2013-09-11 (×2): qty 1

## 2013-09-11 MED ORDER — DEXTROSE 5 % IV SOLN
3.0000 g | Freq: Once | INTRAVENOUS | Status: AC
  Administered 2013-09-11: 3 g via INTRAVENOUS
  Filled 2013-09-11: qty 6

## 2013-09-11 MED ORDER — POTASSIUM CHLORIDE CRYS ER 20 MEQ PO TBCR
40.0000 meq | EXTENDED_RELEASE_TABLET | ORAL | Status: AC
  Administered 2013-09-11 (×2): 40 meq via ORAL
  Filled 2013-09-11 (×2): qty 2

## 2013-09-11 NOTE — Progress Notes (Signed)
[facility] PROGRESS NOTE  Renee Donovan QMV:[id number] DOB: January 11, 1931 DOA: 09/04/2013 PCP: [name], MD  Assessment/Plan: #1 acute appendicitis Patient with some clinical improvement. Per general surgery repeat CT scan showing improvement of ligament and appendicitis. Colitis noted on CT scan. WBC is trending down. Afebrile. Continue empiric antibiotics, Zosyn. Patient has been started on clear liquids. General surgeon following and appreciate input and recommendations.  #2 C. difficile colitis Patient noted to have 7 loose stools yesterday. Patient with one loose stool today. WBC is trending down. Some improvement in abdominal pain. Continue oral vancomycin day #4/14 and Flagyl day #6/14.  #3 chronic diastolic CHF Stable. Grade 2 diastolic dysfunction noted on 2-D echo of 4 2050 with a preserved EF. Continue beta blocker, imdur, hydralazine. On discharge will need to resume low-dose diuretic. Per cardiology.  #4 wide complex tachycardia Episode noted on the night of 09/08/2013. Patient has been started on amiodarone per cardiology. Per cardiology continue amiodarone 400 twice a day intake to 200 mg twice a day on discharge for 8 weeks and then discontinue. Continue beta blocker. Outpatient followup with Dr. Percival Spanish of cardiology as outpatient.  #5 COPD with emphysema Stable. Continue Spiriva.  #6 acute on chronic kidney disease stage IV Baseline creatinine approximately 1.8. On admission was 3.05 in the setting of acute perforated appendicitis and also in the setting of ACE inhibitor. ACE inhibitor has been held. Renal function trending down and currently at 1.47. Stable. Monitor fluid status.  #7 hypokalemia/hypomagnesemia Likely secondary to GI losses from C. difficile colitis. Replete.  #8 anemia Likely dilutional in nature. H&H stable. Follow.  #9 hypertension Continue Lopressor, imdur, hydralazine, clonidine patch.  #10 prophylaxis SCDs for DVT  prophylaxis.  Code Status: DO NOT RESUSCITATE Family Communication: Updated patient no family at bedside. Disposition Plan: Home when medically stable.   Consultants:  Cardiology: Dr. Elias Else 09/05/2013   General surgery: Dr. Excell Seltzer 09/05/2013  Procedures: Ct Abdomen Pelvis Wo Contras 09/05/2013 IMPRESSION: Probable acute versus possibly early perforated appendicitis as evidenced by 2.9 x 2.3 cm inflammatory mass adjacent to the cecum. No abscess or pneumoperitoneum. Low-grade partial small bowel obstruction or, ileus from adjacent appendiceal process. Bilaterals cystic adnexal masses, smaller on the left which would be atypical for neoplasm. Recommend follow-up ultrasound for further characterization. Small right pleural effusion with right lower lobe airspace opacity which may reflect atelectasis, less likely early pneumonia. Severe aortic atherosclerosis was similar multi segmental ectasia. Dg Chest Portable 1 View 09/04/2013 IMPRESSION: No acute abnormality. Probable emphysema  CT abdomen and pelvis without contrast 09/10/2013 IMPRESSION:  Enlarged thickened appendix with mild periappendiceal infiltrative  changes most likely representing a acute appendicitis.  No definite evidence of perforation or abscess.  Bowel wall thickening extending from the rectum to the distal  transverse colon raising question of colitis new since previous  exam.  Cholelithiasis.  Small abdominal aortic aneurysms.  BILATERAL ovarian cysts up to 4.8 cm in size ; recommend followup  nonemergent sonographic characterization.  Chest x-ray 09/04/2013 2-D echo 09/05/2013   Antibiotics: Zosyn 09/04/2013 -->  Flagyl 09/05/2013 -->  Vancomycin PO 6/14 -->   HPI/Subjective: Patient states abdominal pain is improved. Patient complaining of thirst. No nausea, no vomiting. Patient with about 7 loose stools yesterday. Patient with one loose stool today.  Objective: Filed Vitals:   09/11/13 0415  BP:  154/59  Pulse: 56  Temp: 98.3 F (36.8 C)  Resp: 20    Intake/Output Summary (Last 24 hours) at 09/11/13 1032 Last data filed  at 09/11/13 0433  Gross per 24 hour  Intake 1273.33 ml  Output      0 ml  Net 1273.33 ml   Filed Weights   09/09/13 0500 09/10/13 0426 09/11/13 0415  Weight: 69.3 kg (152 lb 12.5 oz) 70.1 kg (154 lb 8.7 oz) 70.6 kg (155 lb 10.3 oz)    Exam:   General:  NAD  Cardiovascular: RRR  Respiratory: CTAB  Abdomen: Soft/ND/ some TTP on left side/+BS  Musculoskeletal: No clubbing cyanosis or edema.   Data Reviewed: Basic Metabolic Panel:  Recent Labs Lab 09/04/13 1953  09/07/13 0600 09/08/13 0213 09/09/13 0335 09/10/13 0348 09/11/13 0320 09/11/13 0520  NA 138  < > 139 139 140 143 143  --   K 3.7  < > 2.9* 3.1* 3.7 3.4* 3.2*  --   CL 95*  < > 105 105 109 110 113*  --   CO2 24  < > 20 19 18* 18* 16*  --   GLUCOSE 134*  < > 81 163* 76 74 67*  --   BUN 42*  < > 33* 25* 19 17 16   --   CREATININE 3.05*  < > 1.97* 1.65* 1.46* 1.49* 1.47*  --   CALCIUM 9.0  < > 7.6* 7.5* 7.6* 7.9* 7.5*  --   MG 1.7  --   --   --   --   --   --  1.4*  < > = values in this interval not displayed. Liver Function Tests:  Recent Labs Lab 09/04/13 1953 09/09/13 0335 09/11/13 0320  AST 17 10 12   ALT 7 5 5   ALKPHOS 75 75 66  BILITOT 0.7 0.2* 0.4  PROT 6.8 4.8* 4.4*  ALBUMIN 2.5* 1.6* 1.4*    Recent Labs Lab 09/04/13 1953  LIPASE 16   No results found for this basename: AMMONIA,  in the last 168 hours CBC:  Recent Labs Lab 09/04/13 1953  09/07/13 0600 09/08/13 0213 09/09/13 0335 09/10/13 0348 09/11/13 0320  WBC 14.7*  < > 17.9* 19.3* 19.8* 21.4* 16.8*  NEUTROABS 11.0*  --   --   --   --   --   --   HGB 13.5  < > 10.1* 11.3* 11.5* 11.4* 10.7*  HCT 40.4  < > 30.3* 33.5* 34.3* 35.2* 32.1*  MCV 89.8  < > 91.0 89.8 89.3 90.3 89.4  PLT 510*  < > 355 377 354 376 325  < > = values in this interval not displayed. Cardiac Enzymes:  Recent Labs Lab  09/04/13 1953 09/05/13 0225 09/05/13 0751 09/05/13 1354  TROPONINI <0.30 <0.30 <0.30 <0.30   BNP (last 3 results)  Recent Labs  07/25/13 0308  PROBNP 4353.0*   CBG:  Recent Labs Lab 09/10/13 1341 09/10/13 1421  GLUCAP 66* 159*    Recent Results (from the past 240 hour(s))  MRSA PCR SCREENING     Status: None   Collection Time    09/05/13  1:48 AM      Result Value Ref Range Status   MRSA by PCR NEGATIVE  NEGATIVE Final   Comment:            The GeneXpert MRSA Assay (FDA     approved for NASAL specimens     only), is one component of a     comprehensive MRSA colonization     surveillance program. It is not     intended to diagnose MRSA     infection nor to guide  or     monitor treatment for     MRSA infections.  CLOSTRIDIUM DIFFICILE BY PCR     Status: Abnormal   Collection Time    09/05/13  4:23 AM      Result Value Ref Range Status   C difficile by pcr POSITIVE (*) NEGATIVE Final   Comment: CRITICAL RESULT CALLED TO, READ BACK BY AND VERIFIED WITH:     WEST P RN 09/05/13 0859 COSTELLO B     Performed at [facility]     Studies: Ct Abdomen Pelvis Wo Contrast  09/10/2013   CLINICAL DATA:  Perforated appendicitis, lower abdominal pain, followup  EXAM: CT ABDOMEN AND PELVIS WITHOUT CONTRAST  TECHNIQUE: Multidetector CT imaging of the abdomen and pelvis was performed following the standard protocol without IV contrast. Patient drank dilute oral contrast for exam. Sagittal and coronal MPR images reconstructed from axial data set.  COMPARISON:  09/04/2013  FINDINGS: Small bibasilar pleural effusions and atelectasis.  Question tiny hiatal hernia.  Minimal pericardial effusion.  Extensive atherosclerotic calcification with aneurysmal dilatation of the abdominal aorta at the aortic hiatus 3.8 cm diameter and of the distal abdominal aorta 3.0 x 2.5 cm image 44.  Dependent gallstones in gallbladder.  Excreted contrast dependently within renal collecting systems and  bladder.  Cyst mid LEFT kidney 2.3 x 1.7 cm unchanged.  Liver, spleen, pancreas, kidneys, and adrenal glands otherwise normal.  Ovaries enlarged bilaterally for age containing cysts up to 4.8 x 4.1 cm LEFT and 4.2 x 3.8 cm RIGHT.  Diffuse bowel wall thickening of the rectum and sigmoid colon extending proximally to the level of the distal transverse colon.  Inflammatory mass identified adjacent to the tip of the cecum 3.6 x 1.5 cm image 52, previously 3.7 x 1.6 cm question representing an enlarged thickened appendix and acute appendicitis.  This now contains a small amount of intraluminal GI contrast proximally.  Periappendiceal inflammatory changes without free intraperitoneal air or fluid identified.  No additional mass, adenopathy, or hernia.  Stomach and remaining bowel loops grossly unremarkable.  Bones demineralized with chronic inferior endplate height loss and irregularity at T11.  IMPRESSION: Enlarged thickened appendix with mild periappendiceal infiltrative changes most likely representing a acute appendicitis.  No definite evidence of perforation or abscess.  Bowel wall thickening extending from the rectum to the distal transverse colon raising question of colitis new since previous exam.  Cholelithiasis.  Small abdominal aortic aneurysms.  BILATERAL ovarian cysts up to 4.8 cm in size ; recommend followup nonemergent sonographic characterization.   Electronically Signed   By: Lavonia Dana M.D.   On: 09/10/2013 12:52    Scheduled Meds: . albuterol  2.5 mg Nebulization BID  . amiodarone  400 mg Oral BID  . antiseptic oral rinse  15 mL Mouth Rinse q12n4p  . chlorhexidine  15 mL Mouth Rinse BID  . cloNIDine  0.2 mg Transdermal Weekly  . hydrALAZINE  25 mg Oral 3 times per day  . isosorbide mononitrate  120 mg Oral Daily  . magnesium sulfate 1 - 4 g bolus IVPB  3 g Intravenous Once  . metoprolol tartrate  25 mg Oral BID  . metronidazole  500 mg Intravenous 3 times per day  .  piperacillin-tazobactam (ZOSYN)  IV  3.375 g Intravenous Q8H  . potassium chloride  40 mEq Oral Q4H  . tiotropium  18 mcg Inhalation Daily  . vancomycin  250 mg Oral 4 times per day   Continuous Infusions: . sodium  chloride 50 mL/hr at 09/08/13 0630    Principal Problem:   Acute perforated appendicitis Active Problems:   Clostridium difficile colitis   COPD (chronic obstructive pulmonary disease) with emphysema   Nausea and vomiting   ARF (acute renal failure)   Diastolic CHF, chronic   Chronic respiratory failure with hypoxia   Ventricular tachycardia, sustained   Protein-calorie malnutrition, severe   Wide-complex tachycardia   Hypokalemia   Hypomagnesemia    Time spent: 35 mins    [facility] MD [facility] Pager [phone]. If 7PM-7AM, please contact night-coverage at [email or web address], password [facility] 09/11/2013, 10:32 AM  LOS: 7 days

## 2013-09-11 NOTE — Progress Notes (Signed)
NUTRITION FOLLOW UP  Intervention:   -Offered nutrition supplements -Assisted pt in ordering meals -Will continue to monitor  Nutrition Dx:   Inadequate oral intake related to altered gi function as evidenced by npo status-improving with clear liquid diet advancement   Goal:   Pt to meet >/= 90% of their estimated nutrition needs-progressing with diet advancement    Monitor:   Total protein/energy intake, labs, weights, GI profile  Assessment:   6/11: 6/11: Positive C. Diff. Skin is crepe like, black on toes and heals and pink on hip. Currently NPO for non-operative management of perforated appendicitis vs cecal diverticulitis.- Bowel rest and IV antibiotics. Patient reports poor intake for the last 2 months. Per e-chart, weight was 176 lbs 07/29/13. Loss of 31 lbs in the past month. Expect loss of fluid muscle mass and body fat. Basic observation of physical status noted.  Patient meets criteria for severe malnutrition related to chronic illness AEB 19% weight loss in the last month, intake less than 75% for >1 month, and decreased body fat and muscle mass.    6/17: -Has been on bowel rest. Advanced to clear liquid diet on 6/17 -Assisted pt in ordering breakfast- was hungry and eager for diet advancement. Declined supplements; dislikes taste of [place] -Continues to experience loose stools d/t to C.diff -Low K/Mg- likely r/t to loose stools, currently being repleted -Wt increased 14 lbs over past 6 days, likely d/t fluids vs weight method  Height: Ht Readings from Last 1 Encounters:  09/05/13 5\' 6"  (1.676 m)    Weight Status:   Wt Readings from Last 1 Encounters:  09/11/13 155 lb 10.3 oz (70.6 kg)  09/05/13 141 lbs  Re-estimated needs:  Kcal: 1500-1600  Protein: 70-80 gm protein  Fluid: 1.5L daily   Skin: Bilateral black ulcer areas on feet, necrotic areas on right heel, skin tear on bottom  Diet Order: Clear Liquid   Intake/Output Summary (Last 24 hours) at  09/11/13 1136 Last data filed at 09/11/13 0433  Gross per 24 hour  Intake 1273.33 ml  Output      0 ml  Net 1273.33 ml    Last BM: 6/17   Labs:   Recent Labs Lab 09/04/13 1953  09/09/13 0335 09/10/13 0348 09/11/13 0320 09/11/13 0520  NA 138  < > 140 143 143  --   K 3.7  < > 3.7 3.4* 3.2*  --   CL 95*  < > 109 110 113*  --   CO2 24  < > 18* 18* 16*  --   BUN 42*  < > 19 17 16   --   CREATININE 3.05*  < > 1.46* 1.49* 1.47*  --   CALCIUM 9.0  < > 7.6* 7.9* 7.5*  --   MG 1.7  --   --   --   --  1.4*  GLUCOSE 134*  < > 76 74 67*  --   < > = values in this interval not displayed.  CBG (last 3)   Recent Labs  09/10/13 1341 09/10/13 1421  GLUCAP 66* 159*    Scheduled Meds: . albuterol  2.5 mg Nebulization BID  . amiodarone  400 mg Oral BID  . antiseptic oral rinse  15 mL Mouth Rinse q12n4p  . chlorhexidine  15 mL Mouth Rinse BID  . cloNIDine  0.2 mg Transdermal Weekly  . hydrALAZINE  25 mg Oral 3 times per day  . isosorbide mononitrate  120 mg Oral Daily  .  magnesium sulfate 1 - 4 g bolus IVPB  3 g Intravenous Once  . metoprolol tartrate  25 mg Oral BID  . metronidazole  500 mg Intravenous 3 times per day  . piperacillin-tazobactam (ZOSYN)  IV  3.375 g Intravenous Q8H  . potassium chloride  40 mEq Oral Q4H  . sodium bicarbonate  650 mg Oral BID  . tiotropium  18 mcg Inhalation Daily  . vancomycin  250 mg Oral 4 times per day    Continuous Infusions: . sodium chloride 50 mL/hr at 09/08/13 [place] LDN Clinical Dietitian [phone]

## 2013-09-11 NOTE — Progress Notes (Signed)
PT Cancellation Note  Patient Details Name: Renee Donovan MRN: [id number] DOB: [birth year]   Cancelled Treatment:    Reason Eval/Treat Not Completed: Patient declined, no reason specified Pt reports she usually ambulates at home however did not want to perform mobility or PT today.  Pt reports she has not been out of bed as she is not yet ready however agreeable for therapy to check back tomorrow.  Educated pt about weakness from remaining in bed and she states she is aware.   LEMYRE,KATHrine E 09/11/2013, 3:11 PM Carmelia Bake, PT, DPT 09/11/2013 Pager: [phone]

## 2013-09-11 NOTE — Progress Notes (Signed)
Patient ID: Renee Donovan, female   DOB: 03-06-1931, 80 y.o.   MRN: [id number]    Subjective: Pt feels ok today.  No new complaints except hungry  Objective: Vital signs in last 24 hours: Temp:  [97.6 F (36.4 C)-98.3 F (36.8 C)] 98.3 F (36.8 C) (06/17 0415) Pulse Rate:  [56-63] 56 (06/17 0415) Resp:  [18-20] 20 (06/17 0415) BP: (124-154)/(45-60) 154/59 mmHg (06/17 0415) SpO2:  [96 %-100 %] 100 % (06/17 0415) Weight:  [155 lb 10.3 oz (70.6 kg)] 155 lb 10.3 oz (70.6 kg) (06/17 0415) Last BM Date: 09/10/13  Intake/Output from previous day: 06/16 0701 - 06/17 0700 In: 1273.3 [I.V.:823.3; IV Piggyback:450] Out: -  Intake/Output this shift:    PE: Abd: soft, minimal left sided tenderness, +BS, ND  Lab Results:   Recent Labs  09/10/13 0348 09/11/13 0320  WBC 21.4* 16.8*  HGB 11.4* 10.7*  HCT 35.2* 32.1*  PLT 376 325   BMET  Recent Labs  09/10/13 0348 09/11/13 0320  NA 143 143  K 3.4* 3.2*  CL 110 113*  CO2 18* 16*  GLUCOSE 74 67*  BUN 17 16  CREATININE 1.49* 1.47*  CALCIUM 7.9* 7.5*   PT/INR No results found for this basename: LABPROT, INR,  in the last 72 hours CMP     Component Value Date/Time   NA 143 09/11/2013 0320   K 3.2* 09/11/2013 0320   CL 113* 09/11/2013 0320   CO2 16* 09/11/2013 0320   GLUCOSE 67* 09/11/2013 0320   BUN 16 09/11/2013 0320   CREATININE 1.47* 09/11/2013 0320   CALCIUM 7.5* 09/11/2013 0320   PROT 4.4* 09/11/2013 0320   ALBUMIN 1.4* 09/11/2013 0320   AST 12 09/11/2013 0320   ALT 5 09/11/2013 0320   ALKPHOS 66 09/11/2013 0320   BILITOT 0.4 09/11/2013 0320   GFRNONAA 32* 09/11/2013 0320   GFRAA 37* 09/11/2013 0320   Lipase     Component Value Date/Time   LIPASE 16 09/04/2013 1953       Studies/Results: Ct Abdomen Pelvis Wo Contrast  09/10/2013   CLINICAL DATA:  Perforated appendicitis, lower abdominal pain, followup  EXAM: CT ABDOMEN AND PELVIS WITHOUT CONTRAST  TECHNIQUE: Multidetector CT imaging of the abdomen and pelvis was  performed following the standard protocol without IV contrast. Patient drank dilute oral contrast for exam. Sagittal and coronal MPR images reconstructed from axial data set.  COMPARISON:  09/04/2013  FINDINGS: Small bibasilar pleural effusions and atelectasis.  Question tiny hiatal hernia.  Minimal pericardial effusion.  Extensive atherosclerotic calcification with aneurysmal dilatation of the abdominal aorta at the aortic hiatus 3.8 cm diameter and of the distal abdominal aorta 3.0 x 2.5 cm image 44.  Dependent gallstones in gallbladder.  Excreted contrast dependently within renal collecting systems and bladder.  Cyst mid LEFT kidney 2.3 x 1.7 cm unchanged.  Liver, spleen, pancreas, kidneys, and adrenal glands otherwise normal.  Ovaries enlarged bilaterally for age containing cysts up to 4.8 x 4.1 cm LEFT and 4.2 x 3.8 cm RIGHT.  Diffuse bowel wall thickening of the rectum and sigmoid colon extending proximally to the level of the distal transverse colon.  Inflammatory mass identified adjacent to the tip of the cecum 3.6 x 1.5 cm image 52, previously 3.7 x 1.6 cm question representing an enlarged thickened appendix and acute appendicitis.  This now contains a small amount of intraluminal GI contrast proximally.  Periappendiceal inflammatory changes without free intraperitoneal air or fluid identified.  No additional mass, adenopathy, or  hernia.  Stomach and remaining bowel loops grossly unremarkable.  Bones demineralized with chronic inferior endplate height loss and irregularity at T11.  IMPRESSION: Enlarged thickened appendix with mild periappendiceal infiltrative changes most likely representing a acute appendicitis.  No definite evidence of perforation or abscess.  Bowel wall thickening extending from the rectum to the distal transverse colon raising question of colitis new since previous exam.  Cholelithiasis.  Small abdominal aortic aneurysms.  BILATERAL ovarian cysts up to 4.8 cm in size ; recommend  followup nonemergent sonographic characterization.   Electronically Signed   By: Lavonia Dana M.D.   On: 09/10/2013 12:52    Anti-infectives: Anti-infectives   Start     Dose/Rate Route Frequency Ordered Stop   09/08/13 1400  piperacillin-tazobactam (ZOSYN) IVPB 3.375 g     3.375 g 12.5 mL/hr over 240 Minutes Intravenous Every 8 hours 09/08/13 0915     09/08/13 1200  vancomycin (VANCOCIN) 50 mg/mL oral solution 250 mg     250 mg Oral 4 times per day 09/08/13 1016     09/05/13 1000  metroNIDAZOLE (FLAGYL) IVPB 500 mg     500 mg 100 mL/hr over 60 Minutes Intravenous 3 times per day 09/05/13 0834     09/05/13 0930  metroNIDAZOLE (FLAGYL) IVPB 500 mg  Status:  Discontinued     500 mg 100 mL/hr over 60 Minutes Intravenous Every 8 hours 09/05/13 0927 09/05/13 0939   09/05/13 0800  piperacillin-tazobactam (ZOSYN) IVPB 2.25 g  Status:  Discontinued     2.25 g 100 mL/hr over 30 Minutes Intravenous Every 6 hours 09/05/13 0213 09/08/13 0915   09/05/13 0030  piperacillin-tazobactam (ZOSYN) IVPB 3.375 g     3.375 g 100 mL/hr over 30 Minutes Intravenous  Once 09/05/13 0028 09/05/13 0128       Assessment/Plan  1. Acute appendicitis 2. c diff colitis  Plan: 1. CT scan shows improvement of appendicitis as phlegmon has resolved.  Her colitis is seen on scan.  Her WBC is trending down.  Continue abx therapy. 2. Will give clears today.   LOS: 7 days    Niaja Stickley E 09/11/2013, 9:20 AM Pager: [phone]

## 2013-09-11 NOTE — Progress Notes (Signed)
ANTIBIOTIC CONSULT NOTE - FOLLOW UP  Pharmacy Consult for Zosyn Indication: Intra-abdominal infection  No Known Allergies  Patient Measurements: Height: 5\' 6"  (167.6 cm) Weight: 155 lb 10.3 oz (70.6 kg) IBW/kg (Calculated) : 59.3  Vital Signs: Temp: 98.3 F (36.8 C) (06/17 0415) Temp src: Oral (06/17 0415) BP: 154/59 mmHg (06/17 0415) Pulse Rate: 56 (06/17 0415) Intake/Output from previous day: 06/16 0701 - 06/17 0700 In: 1273.3 [I.V.:823.3; IV Piggyback:450] Out: -  Intake/Output from this shift:    Labs:  Recent Labs  09/09/13 0335 09/10/13 0348 09/11/13 0320  WBC 19.8* 21.4* 16.8*  HGB 11.5* 11.4* 10.7*  PLT 354 376 325  CREATININE 1.46* 1.49* 1.47*   Estimated Creatinine Clearance: 28.1 ml/min (by C-G formula based on Cr of 1.47). No results found for this basename: VANCOTROUGH, Corlis Leak, VANCORANDOM, Orason, GENTPEAK, GENTRANDOM, TOBRATROUGH, TOBRAPEAK, TOBRARND, AMIKACINPEAK, AMIKACINTROU, AMIKACIN,  in the last 72 hours   Microbiology: Recent Results (from the past 720 hour(s))  MRSA PCR SCREENING     Status: None   Collection Time    09/05/13  1:48 AM      Result Value Ref Range Status   MRSA by PCR NEGATIVE  NEGATIVE Final   Comment:            The GeneXpert MRSA Assay (FDA     approved for NASAL specimens     only), is one component of a     comprehensive MRSA colonization     surveillance program. It is not     intended to diagnose MRSA     infection nor to guide or     monitor treatment for     MRSA infections.  CLOSTRIDIUM DIFFICILE BY PCR     Status: Abnormal   Collection Time    09/05/13  4:23 AM      Result Value Ref Range Status   C difficile by pcr POSITIVE (*) NEGATIVE Final   Comment: CRITICAL RESULT CALLED TO, READ BACK BY AND VERIFIED WITH:     WEST P RN 09/05/13 0859 COSTELLO B     Performed at [facility]    Assessment: 80yo female who presents to the emergency room with n/v/d x ~ 2 weeks. On antibiotics for  subacute perforated appendicits vs cecal diverticulitis and CDiff. CCS plan is to continue non-op mgmnt with IV abx for now.  Anti-infectives 6/11 >>zosyn >> 6/11 >>metronidazole >>  6/14 >> vancomycin (PO) >>  Vitals/Labs Tmax: afeb WBCs: Elevated but improved overnight to 16.8K Renal: AoCKD-IV baseline SCr 1.8 - SCr stable overnight, sl lower than baseline, CrCl 28  Microbiology 6/11: c. Diff: POSITIVE  Drug level / dose changes info 6/14: Increase zosyn q6h -->ZEI q8h  Goal of Therapy:  Eradication of infection; Zosyn dose per renal function  Plan:   Continue Zosyn 3.375gm IV q8h (4hr extended infusions) Follow up renal function & cultures Follow up duration of therapy   Ralene Bathe, PharmD, BCPS 09/11/2013, 7:38 AM  Pager: [phone]

## 2013-09-12 LAB — CBC WITH DIFFERENTIAL/PLATELET
BASOS ABS: 0.2 10*3/uL — AB (ref 0.0–0.1)
Basophils Relative: 1 % (ref 0–1)
EOS ABS: 0.4 10*3/uL (ref 0.0–0.7)
EOS PCT: 2 % (ref 0–5)
HCT: 34 % — ABNORMAL LOW (ref 36.0–46.0)
Hemoglobin: 11.1 g/dL — ABNORMAL LOW (ref 12.0–15.0)
LYMPHS ABS: 2.2 10*3/uL (ref 0.7–4.0)
Lymphocytes Relative: 12 % (ref 12–46)
MCH: 30.5 pg (ref 26.0–34.0)
MCHC: 32.6 g/dL (ref 30.0–36.0)
MCV: 93.4 fL (ref 78.0–100.0)
MONO ABS: 2 10*3/uL — AB (ref 0.1–1.0)
Monocytes Relative: 11 % (ref 3–12)
NEUTROS PCT: 74 % (ref 43–77)
Neutro Abs: 13.7 10*3/uL — ABNORMAL HIGH (ref 1.7–7.7)
PLATELETS: 354 10*3/uL (ref 150–400)
RBC: 3.64 MIL/uL — AB (ref 3.87–5.11)
RDW: 15 % (ref 11.5–15.5)
WBC: 18.5 10*3/uL — ABNORMAL HIGH (ref 4.0–10.5)

## 2013-09-12 LAB — BASIC METABOLIC PANEL
BUN: 15 mg/dL (ref 6–23)
CO2: 17 mEq/L — ABNORMAL LOW (ref 19–32)
CREATININE: 1.59 mg/dL — AB (ref 0.50–1.10)
Calcium: 7.7 mg/dL — ABNORMAL LOW (ref 8.4–10.5)
Chloride: 111 mEq/L (ref 96–112)
GFR calc Af Amer: 34 mL/min — ABNORMAL LOW (ref >90)
GFR, EST NON AFRICAN AMERICAN: 29 mL/min — AB (ref >90)
Glucose, Bld: 101 mg/dL — ABNORMAL HIGH (ref 70–99)
Potassium: 4.1 mEq/L (ref 3.7–5.3)
SODIUM: 141 meq/L (ref 137–147)

## 2013-09-12 LAB — MAGNESIUM: MAGNESIUM: 2.1 mg/dL (ref 1.5–2.5)

## 2013-09-12 MED ORDER — AMIODARONE HCL 200 MG PO TABS
200.0000 mg | ORAL_TABLET | Freq: Every day | ORAL | Status: DC
  Administered 2013-09-12 – 2013-09-23 (×12): 200 mg via ORAL
  Filled 2013-09-12 (×12): qty 1

## 2013-09-12 NOTE — Progress Notes (Signed)
    Subjective:  No dyspnea or palpitations. Abdominal pain improving  Objective:  Vital Signs in the last 24 hours: Temp:  [97.4 F (36.3 C)-98.5 F (36.9 C)] 97.6 F (36.4 C) (06/18 0603) Pulse Rate:  [51-72] 51 (06/18 0603) Resp:  [20] 20 (06/17 2300) BP: (121-188)/(48-75) 136/51 mmHg (06/18 0603) SpO2:  [100 %] 100 % (06/18 0603) Weight:  [161 lb 9.6 oz (73.3 kg)] 161 lb 9.6 oz (73.3 kg) (06/18 0500)  Intake/Output from previous day: 06/17 0701 - 06/18 0700 In: 2611.7 [P.O.:600; I.V.:1561.7; IV Piggyback:450] Out: -   Physical Exam: Pt is A and O; NAD. HEENT: normal Neck: JVP - supple Lungs: CTA bilaterally CV: RRR without murmur or gallop Abd: mild tenderness to palpation Ext: no edema Skin: warm/dry no rash   Lab Results:  Recent Labs  09/11/13 0320 09/12/13 0323  WBC 16.8* 18.5*  HGB 10.7* 11.1*  PLT 325 354    Recent Labs  09/11/13 0320 09/12/13 0323  NA 143 141  K 3.2* 4.1  CL 113* 111  CO2 16* 17*  GLUCOSE 67* 101*  BUN 16 15  CREATININE 1.47* 1.59*    Cardiac Studies: 2D Echo: Study Conclusions  - Left ventricle: The cavity size was normal. There was mild focal basal hypertrophy of the septum. Systolic function was normal. The estimated ejection fraction was in the range of 60% to 65%. Wall motion was normal; there were no regional wall motion abnormalities. There was an increased relative contribution of atrial contraction to ventricular filling. Doppler parameters are consistent with abnormal left ventricular relaxation (grade 1 diastolic dysfunction). - Mitral valve: There was trivial regurgitation.  Tele: Sinus with NSVT  Assessment/Plan:  1. Wide Complex Tachycardia  2. HFpEF  3. HTN  4. COPD  5. Perforated appendicitis 6. Adnexal cystic lesions  Doing well; DC telemetry. LVEF normal by echo. Continue beta-blocker at current dose. Change amiodarone to 200 mg daily; continue for 8 weeks and then DC (hopefully arrhythmias  are related to ongoing abdominal process). Will need abdominal ultrasound at some point for adnexal cystic lesions; will leave to primary care. Would continue present dose of BP meds at DC as BP controlled. Note verapamil, lasix, lisinopril/HCT have been DCed. She will need close FU with Dr Percival Spanish following DC. We will sign off. Please call with questions. Kirk Ruths, M.D. 09/12/2013, 7:29 AM

## 2013-09-12 NOTE — Care Management Note (Addendum)
    Page 1 of 1   09/19/2013     4:50:35 PM CARE MANAGEMENT NOTE 09/19/2013  Patient:  LUCELLA, POMMIER   Account Number:  [id number]  Date Initiated:  09/05/2013  Documentation initiated by:  DAVIS,RHONDA  Subjective/Objective Assessment:   pt with a.fib admitted into sdu for iv amiodarone drip converted and to be moved to tele floor [phone]     Action/Plan:   home when stable   Anticipated DC Date:  09/20/2013   Anticipated DC Plan:  [place]  CM consult      Choice offered to / List presented to:             Status of service:  In process, will continue to follow Medicare Important Message given?  YES (If response is "NO", the following Medicare IM given date fields will be blank) Date Medicare IM given:  09/19/2013 Date Additional Medicare IM given:    Discharge Disposition:    Per UR Regulation:  Reviewed for med. necessity/level of care/duration of stay  If discussed at [place] of Stay Meetings, dates discussed:   09/12/2013  09/17/2013  09/19/2013    Comments:  09/19/13 Lauris Serviss RN,BSN NCM [place] @ [facility] SITE-ID FOLLOWING.?DEBRIDEMENT.FLEXISEAL,VANCO ENEMAS.D/C PLAN SNF.  09/16/13 Dayln Tugwell RN,BSN NCM [phone] LOOSE STOOLS,FLEXISEAL,VANCO ENEMAS,W-17,DYSPHAGIA 1 DIET,FLAGYL PO.D/C PLAN SNF.  09/12/13 Huie Ghuman RN,BSN NCM [phone] +C DIFF.LOOSE STOOLS.CLEARS.IV ABX.D/C PLAN SNF.  Rhonda Davis,RN,BSN,CCM

## 2013-09-12 NOTE — Progress Notes (Signed)
PT Cancellation Note  Patient Details Name: Renee Donovan MRN: [id number] DOB: [birth year]   Cancelled Treatment:    Reason Eval/Treat Not Completed: Patient declined,  (states she may try in the AM. Pt has declined x 3 days .)   [name] 09/12/2013, 5:29 PM

## 2013-09-12 NOTE — Progress Notes (Signed)
[facility] PROGRESS NOTE  Renee Donovan QMG:[id number] DOB: May 02, 1930 DOA: 09/04/2013 PCP: [name], MD  Assessment/Plan: #1 acute appendicitis Patient with some clinical improvement. Per general surgery repeat CT scan showing improvement of  appendicitis. Colitis noted on CT scan. WBC is fluctuating. Afebrile. Continue empiric antibiotics, Zosyn. Patient is tolerating clear liquids, and has been advanced to full liquids per surgery. General surgeon following and appreciate input and recommendations.  #2 C. difficile colitis Patient noted to have multiple loose stools yesterday. WBC is fluctuating. Some improvement in abdominal pain. Continue oral vancomycin day #5/14. Continue Flagyl day #7/14 and change to oral in the next one to 2 days.  #3 chronic diastolic CHF Stable. Grade 2 diastolic dysfunction noted on 2-D echo of 09/05/2013 with a preserved EF. Continue beta blocker, imdur, hydralazine. Per cardiology.  #4 wide complex tachycardia Episode noted on the night of 09/08/2013. Patient has been started on amiodarone per cardiology. Amiodarone dose has been changed to 200 mg daily for the next 8 weeks and then DC per cardiology. Continue beta blocker. Outpatient followup with Dr. Percival Spanish of cardiology as outpatient.  #5 COPD with emphysema Stable. Continue Spiriva.  #6 acute on chronic kidney disease stage IV Baseline creatinine approximately 1.8. On admission was 3.05 in the setting of acute perforated appendicitis and also in the setting of ACE inhibitor. ACE inhibitor has been held. Renal function trending down and currently at 1.59. Stable. Monitor fluid status.  #7 hypokalemia/hypomagnesemia Likely secondary to GI losses from C. difficile colitis. Repleted.  #8 anemia Likely dilutional in nature. H&H stable. Follow.  #9 hypertension Continue Lopressor, imdur, hydralazine, clonidine patch.  #10 adnexal cystic lesions Will need outpatient abdominal ultrasound  followup.  #11 prophylaxis SCDs for DVT prophylaxis.  Code Status: DO NOT RESUSCITATE Family Communication: Updated patient no family at bedside. Disposition Plan: Home when medically stable.   Consultants:  Cardiology: Dr. Elias Else 09/05/2013   General surgery: Dr. Excell Seltzer 09/05/2013  Procedures: Ct Abdomen Pelvis Wo Contras 09/05/2013 IMPRESSION: Probable acute versus possibly early perforated appendicitis as evidenced by 2.9 x 2.3 cm inflammatory mass adjacent to the cecum. No abscess or pneumoperitoneum. Low-grade partial small bowel obstruction or, ileus from adjacent appendiceal process. Bilaterals cystic adnexal masses, smaller on the left which would be atypical for neoplasm. Recommend follow-up ultrasound for further characterization. Small right pleural effusion with right lower lobe airspace opacity which may reflect atelectasis, less likely early pneumonia. Severe aortic atherosclerosis was similar multi segmental ectasia. Dg Chest Portable 1 View 09/04/2013 IMPRESSION: No acute abnormality. Probable emphysema  CT abdomen and pelvis without contrast 09/10/2013 IMPRESSION:  Enlarged thickened appendix with mild periappendiceal infiltrative  changes most likely representing a acute appendicitis.  No definite evidence of perforation or abscess.  Bowel wall thickening extending from the rectum to the distal  transverse colon raising question of colitis new since previous  exam.  Cholelithiasis.  Small abdominal aortic aneurysms.  BILATERAL ovarian cysts up to 4.8 cm in size ; recommend followup  nonemergent sonographic characterization.  Chest x-ray 09/04/2013 2-D echo 09/05/2013   Antibiotics: Zosyn 09/04/2013 -->  Flagyl 09/05/2013 -->  Vancomycin PO 6/14 -->   HPI/Subjective: Patient states abdominal pain is improved. No nausea, no vomiting. Patient with multiple loose stools yesterday.   Objective: Filed Vitals:   09/12/13 0603  BP: 136/51  Pulse: 51  Temp:  97.6 F (36.4 C)  Resp:     Intake/Output Summary (Last 24 hours) at 09/12/13 1117 Last data filed at 09/12/13 0840  Gross per 24 hour  Intake 3211.67 ml  Output      0 ml  Net 3211.67 ml   Filed Weights   09/10/13 0426 09/11/13 0415 09/12/13 0500  Weight: 70.1 kg (154 lb 8.7 oz) 70.6 kg (155 lb 10.3 oz) 73.3 kg (161 lb 9.6 oz)    Exam:   General:  NAD  Cardiovascular: RRR  Respiratory: CTAB  Abdomen: Soft/ND/ NTTP/+BS  Musculoskeletal: No clubbing cyanosis or edema.   Data Reviewed: Basic Metabolic Panel:  Recent Labs Lab 09/08/13 0213 09/09/13 0335 09/10/13 0348 09/11/13 0320 09/11/13 0520 09/12/13 0323  NA 139 140 143 143  --  141  K 3.1* 3.7 3.4* 3.2*  --  4.1  CL 105 109 110 113*  --  111  CO2 19 18* 18* 16*  --  17*  GLUCOSE 163* 76 74 67*  --  101*  BUN 25* 19 17 16   --  15  CREATININE 1.65* 1.46* 1.49* 1.47*  --  1.59*  CALCIUM 7.5* 7.6* 7.9* 7.5*  --  7.7*  MG  --   --   --   --  1.4* 2.1   Liver Function Tests:  Recent Labs Lab 09/09/13 0335 09/11/13 0320  AST 10 12  ALT 5 5  ALKPHOS 75 66  BILITOT 0.2* 0.4  PROT 4.8* 4.4*  ALBUMIN 1.6* 1.4*   No results found for this basename: LIPASE, AMYLASE,  in the last 168 hours No results found for this basename: AMMONIA,  in the last 168 hours CBC:  Recent Labs Lab 09/08/13 0213 09/09/13 0335 09/10/13 0348 09/11/13 0320 09/12/13 0323  WBC 19.3* 19.8* 21.4* 16.8* 18.5*  NEUTROABS  --   --   --   --  13.7*  HGB 11.3* 11.5* 11.4* 10.7* 11.1*  HCT 33.5* 34.3* 35.2* 32.1* 34.0*  MCV 89.8 89.3 90.3 89.4 93.4  PLT 377 354 376 325 354   Cardiac Enzymes:  Recent Labs Lab 09/05/13 1354  TROPONINI <0.30   BNP (last 3 results)  Recent Labs  07/25/13 0308  PROBNP 4353.0*   CBG:  Recent Labs Lab 09/10/13 1341 09/10/13 1421  GLUCAP 66* 159*    Recent Results (from the past 240 hour(s))  MRSA PCR SCREENING     Status: None   Collection Time    09/05/13  1:48 AM      Result  Value Ref Range Status   MRSA by PCR NEGATIVE  NEGATIVE Final   Comment:            The GeneXpert MRSA Assay (FDA     approved for NASAL specimens     only), is one component of a     comprehensive MRSA colonization     surveillance program. It is not     intended to diagnose MRSA     infection nor to guide or     monitor treatment for     MRSA infections.  CLOSTRIDIUM DIFFICILE BY PCR     Status: Abnormal   Collection Time    09/05/13  4:23 AM      Result Value Ref Range Status   C difficile by pcr POSITIVE (*) NEGATIVE Final   Comment: CRITICAL RESULT CALLED TO, READ BACK BY AND VERIFIED WITH:     WEST P RN 09/05/13 0859 COSTELLO B     Performed at [facility]     Studies: Ct Abdomen Pelvis Wo Contrast  09/10/2013   CLINICAL DATA:  Perforated appendicitis,  lower abdominal pain, followup  EXAM: CT ABDOMEN AND PELVIS WITHOUT CONTRAST  TECHNIQUE: Multidetector CT imaging of the abdomen and pelvis was performed following the standard protocol without IV contrast. Patient drank dilute oral contrast for exam. Sagittal and coronal MPR images reconstructed from axial data set.  COMPARISON:  09/04/2013  FINDINGS: Small bibasilar pleural effusions and atelectasis.  Question tiny hiatal hernia.  Minimal pericardial effusion.  Extensive atherosclerotic calcification with aneurysmal dilatation of the abdominal aorta at the aortic hiatus 3.8 cm diameter and of the distal abdominal aorta 3.0 x 2.5 cm image 44.  Dependent gallstones in gallbladder.  Excreted contrast dependently within renal collecting systems and bladder.  Cyst mid LEFT kidney 2.3 x 1.7 cm unchanged.  Liver, spleen, pancreas, kidneys, and adrenal glands otherwise normal.  Ovaries enlarged bilaterally for age containing cysts up to 4.8 x 4.1 cm LEFT and 4.2 x 3.8 cm RIGHT.  Diffuse bowel wall thickening of the rectum and sigmoid colon extending proximally to the level of the distal transverse colon.  Inflammatory mass identified  adjacent to the tip of the cecum 3.6 x 1.5 cm image 52, previously 3.7 x 1.6 cm question representing an enlarged thickened appendix and acute appendicitis.  This now contains a small amount of intraluminal GI contrast proximally.  Periappendiceal inflammatory changes without free intraperitoneal air or fluid identified.  No additional mass, adenopathy, or hernia.  Stomach and remaining bowel loops grossly unremarkable.  Bones demineralized with chronic inferior endplate height loss and irregularity at T11.  IMPRESSION: Enlarged thickened appendix with mild periappendiceal infiltrative changes most likely representing a acute appendicitis.  No definite evidence of perforation or abscess.  Bowel wall thickening extending from the rectum to the distal transverse colon raising question of colitis new since previous exam.  Cholelithiasis.  Small abdominal aortic aneurysms.  BILATERAL ovarian cysts up to 4.8 cm in size ; recommend followup nonemergent sonographic characterization.   Electronically Signed   By: Lavonia Dana M.D.   On: 09/10/2013 12:52    Scheduled Meds: . albuterol  2.5 mg Nebulization BID  . amiodarone  200 mg Oral Daily  . antiseptic oral rinse  15 mL Mouth Rinse q12n4p  . chlorhexidine  15 mL Mouth Rinse BID  . cloNIDine  0.2 mg Transdermal Weekly  . hydrALAZINE  25 mg Oral 3 times per day  . isosorbide mononitrate  120 mg Oral Daily  . metoprolol tartrate  25 mg Oral BID  . metronidazole  500 mg Intravenous 3 times per day  . piperacillin-tazobactam (ZOSYN)  IV  3.375 g Intravenous Q8H  . tiotropium  18 mcg Inhalation Daily  . vancomycin  250 mg Oral 4 times per day   Continuous Infusions: . sodium chloride 50 mL/hr at 09/08/13 0630    Principal Problem:   Acute perforated appendicitis Active Problems:   Clostridium difficile colitis   COPD (chronic obstructive pulmonary disease) with emphysema   Nausea and vomiting   ARF (acute renal failure)   Diastolic CHF, chronic    Chronic respiratory failure with hypoxia   Ventricular tachycardia, sustained   Protein-calorie malnutrition, severe   Wide-complex tachycardia   Hypokalemia   Hypomagnesemia    Time spent: 35 mins    [facility] MD [facility] Pager [phone]. If 7PM-7AM, please contact night-coverage at [email or web address], password [facility] 09/12/2013, 11:17 AM  LOS: 8 days

## 2013-09-12 NOTE — Progress Notes (Signed)
OT Cancellation Note  Patient Details Name: Renee Donovan MRN: [id number] DOB: [birth year]   Cancelled Treatment:    Reason Eval/Treat Not Completed: Other (comment) Pt refused despite max encouragement. Explained benefit of OOB activity to help her strengthen. Pt states she is tired and wants to sleep right now but states she slept fine last night. She states, "leave me alone" when therapist gently encouraged at least a little OOB right now. Nursing informed. If pt continues to refuse, will have to sign off.   Jules Schick [phone] 09/12/2013, 12:46 PM

## 2013-09-12 NOTE — Progress Notes (Signed)
Patient ID: Renee Donovan, female   DOB: [birth year], 80 y.o.   MRN: [id number]    Subjective: Pt feels well today.  Tolerating clear liquids, no abdominal pain  Objective: Vital signs in last 24 hours: Temp:  [97.4 F (36.3 C)-98.5 F (36.9 C)] 97.6 F (36.4 C) (06/18 0603) Pulse Rate:  [51-72] 51 (06/18 0603) Resp:  [20] 20 (06/17 2300) BP: (121-188)/(48-75) 136/51 mmHg (06/18 0603) SpO2:  [100 %] 100 % (06/18 0603) Weight:  [161 lb 9.6 oz (73.3 kg)] 161 lb 9.6 oz (73.3 kg) (06/18 0500) Last BM Date: 09/11/13  Intake/Output from previous day: 06/17 0701 - 06/18 0700 In: 2611.7 [P.O.:600; I.V.:1561.7; IV Piggyback:450] Out: -  Intake/Output this shift: Total I/O In: 600 [P.O.:600] Out: -   PE: Abd: soft, NT, ND, +BS  Lab Results:   Recent Labs  09/11/13 0320 09/12/13 0323  WBC 16.8* 18.5*  HGB 10.7* 11.1*  HCT 32.1* 34.0*  PLT 325 354   BMET  Recent Labs  09/11/13 0320 09/12/13 0323  NA 143 141  K 3.2* 4.1  CL 113* 111  CO2 16* 17*  GLUCOSE 67* 101*  BUN 16 15  CREATININE 1.47* 1.59*  CALCIUM 7.5* 7.7*   PT/INR No results found for this basename: LABPROT, INR,  in the last 72 hours CMP     Component Value Date/Time   NA 141 09/12/2013 0323   K 4.1 09/12/2013 0323   CL 111 09/12/2013 0323   CO2 17* 09/12/2013 0323   GLUCOSE 101* 09/12/2013 0323   BUN 15 09/12/2013 0323   CREATININE 1.59* 09/12/2013 0323   CALCIUM 7.7* 09/12/2013 0323   PROT 4.4* 09/11/2013 0320   ALBUMIN 1.4* 09/11/2013 0320   AST 12 09/11/2013 0320   ALT 5 09/11/2013 0320   ALKPHOS 66 09/11/2013 0320   BILITOT 0.4 09/11/2013 0320   GFRNONAA 29* 09/12/2013 0323   GFRAA 34* 09/12/2013 0323   Lipase     Component Value Date/Time   LIPASE 16 09/04/2013 1953       Studies/Results: Ct Abdomen Pelvis Wo Contrast  09/10/2013   CLINICAL DATA:  Perforated appendicitis, lower abdominal pain, followup  EXAM: CT ABDOMEN AND PELVIS WITHOUT CONTRAST  TECHNIQUE: Multidetector CT imaging of  the abdomen and pelvis was performed following the standard protocol without IV contrast. Patient drank dilute oral contrast for exam. Sagittal and coronal MPR images reconstructed from axial data set.  COMPARISON:  09/04/2013  FINDINGS: Small bibasilar pleural effusions and atelectasis.  Question tiny hiatal hernia.  Minimal pericardial effusion.  Extensive atherosclerotic calcification with aneurysmal dilatation of the abdominal aorta at the aortic hiatus 3.8 cm diameter and of the distal abdominal aorta 3.0 x 2.5 cm image 44.  Dependent gallstones in gallbladder.  Excreted contrast dependently within renal collecting systems and bladder.  Cyst mid LEFT kidney 2.3 x 1.7 cm unchanged.  Liver, spleen, pancreas, kidneys, and adrenal glands otherwise normal.  Ovaries enlarged bilaterally for age containing cysts up to 4.8 x 4.1 cm LEFT and 4.2 x 3.8 cm RIGHT.  Diffuse bowel wall thickening of the rectum and sigmoid colon extending proximally to the level of the distal transverse colon.  Inflammatory mass identified adjacent to the tip of the cecum 3.6 x 1.5 cm image 52, previously 3.7 x 1.6 cm question representing an enlarged thickened appendix and acute appendicitis.  This now contains a small amount of intraluminal GI contrast proximally.  Periappendiceal inflammatory changes without free intraperitoneal air or fluid identified.  No  additional mass, adenopathy, or hernia.  Stomach and remaining bowel loops grossly unremarkable.  Bones demineralized with chronic inferior endplate height loss and irregularity at T11.  IMPRESSION: Enlarged thickened appendix with mild periappendiceal infiltrative changes most likely representing a acute appendicitis.  No definite evidence of perforation or abscess.  Bowel wall thickening extending from the rectum to the distal transverse colon raising question of colitis new since previous exam.  Cholelithiasis.  Small abdominal aortic aneurysms.  BILATERAL ovarian cysts up to 4.8 cm  in size ; recommend followup nonemergent sonographic characterization.   Electronically Signed   By: Lavonia Dana M.D.   On: 09/10/2013 12:52    Anti-infectives: Anti-infectives   Start     Dose/Rate Route Frequency Ordered Stop   09/08/13 1400  piperacillin-tazobactam (ZOSYN) IVPB 3.375 g     3.375 g 12.5 mL/hr over 240 Minutes Intravenous Every 8 hours 09/08/13 0915     09/08/13 1200  vancomycin (VANCOCIN) 50 mg/mL oral solution 250 mg     250 mg Oral 4 times per day 09/08/13 1016     09/05/13 1000  metroNIDAZOLE (FLAGYL) IVPB 500 mg     500 mg 100 mL/hr over 60 Minutes Intravenous 3 times per day 09/05/13 0834     09/05/13 0930  metroNIDAZOLE (FLAGYL) IVPB 500 mg  Status:  Discontinued     500 mg 100 mL/hr over 60 Minutes Intravenous Every 8 hours 09/05/13 0927 09/05/13 0939   09/05/13 0800  piperacillin-tazobactam (ZOSYN) IVPB 2.25 g  Status:  Discontinued     2.25 g 100 mL/hr over 30 Minutes Intravenous Every 6 hours 09/05/13 0213 09/08/13 0915   09/05/13 0030  piperacillin-tazobactam (ZOSYN) IVPB 3.375 g     3.375 g 100 mL/hr over 30 Minutes Intravenous  Once 09/05/13 0028 09/05/13 0128       Assessment/Plan  1. Acute perforated appendicitis 2. c diff colitis   Plan: 1. Cont abx therapy.  Will try full liquids and see how she does with this.  LOS: 8 days    Keylie Beavers E 09/12/2013, 9:37 AM Pager: [phone]

## 2013-09-13 ENCOUNTER — Inpatient Hospital Stay (HOSPITAL_COMMUNITY): Payer: Medicare Other

## 2013-09-13 DIAGNOSIS — I10 Essential (primary) hypertension: Secondary | ICD-10-CM

## 2013-09-13 LAB — BASIC METABOLIC PANEL
BUN: 14 mg/dL (ref 6–23)
CO2: 18 mEq/L — ABNORMAL LOW (ref 19–32)
CREATININE: 1.56 mg/dL — AB (ref 0.50–1.10)
Calcium: 7.4 mg/dL — ABNORMAL LOW (ref 8.4–10.5)
Chloride: 112 mEq/L (ref 96–112)
GFR, EST AFRICAN AMERICAN: 35 mL/min — AB (ref >90)
GFR, EST NON AFRICAN AMERICAN: 30 mL/min — AB (ref >90)
Glucose, Bld: 103 mg/dL — ABNORMAL HIGH (ref 70–99)
Potassium: 3.6 mEq/L — ABNORMAL LOW (ref 3.7–5.3)
SODIUM: 140 meq/L (ref 137–147)

## 2013-09-13 LAB — CBC WITH DIFFERENTIAL/PLATELET
BASOS ABS: 0 10*3/uL (ref 0.0–0.1)
Basophils Relative: 0 % (ref 0–1)
EOS PCT: 2 % (ref 0–5)
Eosinophils Absolute: 0.4 10*3/uL (ref 0.0–0.7)
HCT: 34.2 % — ABNORMAL LOW (ref 36.0–46.0)
Hemoglobin: 11.1 g/dL — ABNORMAL LOW (ref 12.0–15.0)
Lymphocytes Relative: 12 % (ref 12–46)
Lymphs Abs: 2.5 10*3/uL (ref 0.7–4.0)
MCH: 29.2 pg (ref 26.0–34.0)
MCHC: 32.5 g/dL (ref 30.0–36.0)
MCV: 90 fL (ref 78.0–100.0)
MONOS PCT: 10 % (ref 3–12)
Monocytes Absolute: 2.1 10*3/uL — ABNORMAL HIGH (ref 0.1–1.0)
NEUTROS PCT: 76 % (ref 43–77)
Neutro Abs: 16.1 10*3/uL — ABNORMAL HIGH (ref 1.7–7.7)
Platelets: 344 10*3/uL (ref 150–400)
RBC: 3.8 MIL/uL — AB (ref 3.87–5.11)
RDW: 15.5 % (ref 11.5–15.5)
WBC: 21.1 10*3/uL — AB (ref 4.0–10.5)

## 2013-09-13 LAB — URINALYSIS, ROUTINE W REFLEX MICROSCOPIC
BILIRUBIN URINE: NEGATIVE
GLUCOSE, UA: NEGATIVE mg/dL
KETONES UR: NEGATIVE mg/dL
Nitrite: POSITIVE — AB
PH: 5 (ref 5.0–8.0)
Protein, ur: 30 mg/dL — AB
Specific Gravity, Urine: 1.02 (ref 1.005–1.030)
Urobilinogen, UA: 0.2 mg/dL (ref 0.0–1.0)

## 2013-09-13 LAB — URINE MICROSCOPIC-ADD ON

## 2013-09-13 MED ORDER — POTASSIUM CHLORIDE CRYS ER 20 MEQ PO TBCR
40.0000 meq | EXTENDED_RELEASE_TABLET | ORAL | Status: AC
  Administered 2013-09-13 (×2): 40 meq via ORAL
  Filled 2013-09-13 (×3): qty 2

## 2013-09-13 MED ORDER — METRONIDAZOLE 500 MG PO TABS
500.0000 mg | ORAL_TABLET | Freq: Three times a day (TID) | ORAL | Status: DC
  Administered 2013-09-13 – 2013-09-20 (×20): 500 mg via ORAL
  Filled 2013-09-13 (×24): qty 1

## 2013-09-13 MED ORDER — VANCOMYCIN HCL 500 MG IV SOLR
500.0000 mg | Freq: Four times a day (QID) | Status: DC
  Administered 2013-09-13 – 2013-09-20 (×26): 500 mg via RECTAL
  Filled 2013-09-13 (×43): qty 500

## 2013-09-13 NOTE — Progress Notes (Signed)
[facility] PROGRESS NOTE  Renee Donovan LEX:[id number] DOB: 1930/11/30 DOA: 09/04/2013 PCP: [name], MD  Assessment/Plan: #1 acute appendicitis Patient with some clinical improvement. Per general surgery repeat CT scan showing improvement of  appendicitis. Colitis noted on CT scan. WBC is fluctuating. Afebrile. Continue empiric antibiotics, Zosyn. Patient is tolerating clear liquids, and has been advanced to full liquids per surgery. General surgeon following and appreciate input and recommendations.  #2 C. difficile colitis Patient noted to have multiple loose stools yesterday. WBC is fluctuating and rising. Some improvement in abdominal pain. Change oral vancomycin to vancomycin enema day #6/14. Change IV Flagyl to oral day #8/14.  #3 chronic diastolic CHF Stable. Grade 2 diastolic dysfunction noted on 2-D echo of 09/05/2013 with a preserved EF. Continue beta blocker, imdur, hydralazine. Per cardiology.  #4 wide complex tachycardia Episode noted on the night of 09/08/2013. Patient has been started on amiodarone per cardiology. Amiodarone dose has been changed to 200 mg daily for the next 8 weeks and then DC per cardiology. Continue beta blocker. Outpatient followup with Dr. Percival Spanish of cardiology as outpatient.  #5 COPD with emphysema Stable. Continue Spiriva.  #6 acute on chronic kidney disease stage IV Baseline creatinine approximately 1.8. On admission was 3.05 in the setting of acute perforated appendicitis and also in the setting of ACE inhibitor. ACE inhibitor has been held. Renal function trending down and currently at 1.56. Stable. Monitor fluid status.  #7 hypokalemia/hypomagnesemia Likely secondary to GI losses from C. difficile colitis. Repleted.  #8 anemia Likely dilutional in nature. H&H stable. Follow.  #9 hypertension Continue Lopressor, imdur, hydralazine, clonidine patch.  #10 adnexal cystic lesions Will need outpatient abdominal ultrasound  followup.  #11 leukocytosis Questionable etiology. Likely secondary to C. difficile colitis. Chest x-ray shows new left lung base opacity most likely combination of small effusion with either infiltrate or atelectasis. Patient has empirically been on IV Zosyn and has no respiratory symptoms and unlikely to be a pneumonia. Will check a UA with cultures and sensitivities. Change oral vancomycin to vancomycin enema. Change IV Flagyl to oral Flagyl. Follow.  #12 prophylaxis SCDs for DVT prophylaxis.  Code Status: DO NOT RESUSCITATE Family Communication: Updated patient no family at bedside. Disposition Plan: Home vs SNF when medically stable.   Consultants:  Cardiology: Dr. Elias Else 09/05/2013   General surgery: Dr. Excell Seltzer 09/05/2013  Procedures: Ct Abdomen Pelvis Wo Contras 09/05/2013 IMPRESSION: Probable acute versus possibly early perforated appendicitis as evidenced by 2.9 x 2.3 cm inflammatory mass adjacent to the cecum. No abscess or pneumoperitoneum. Low-grade partial small bowel obstruction or, ileus from adjacent appendiceal process. Bilaterals cystic adnexal masses, smaller on the left which would be atypical for neoplasm. Recommend follow-up ultrasound for further characterization. Small right pleural effusion with right lower lobe airspace opacity which may reflect atelectasis, less likely early pneumonia. Severe aortic atherosclerosis was similar multi segmental ectasia. Dg Chest Portable 1 View 09/04/2013 IMPRESSION: No acute abnormality. Probable emphysema  CT abdomen and pelvis without contrast 09/10/2013 IMPRESSION:  Enlarged thickened appendix with mild periappendiceal infiltrative  changes most likely representing a acute appendicitis.  No definite evidence of perforation or abscess.  Bowel wall thickening extending from the rectum to the distal  transverse colon raising question of colitis new since previous  exam.  Cholelithiasis.  Small abdominal aortic aneurysms.   BILATERAL ovarian cysts up to 4.8 cm in size ; recommend followup  nonemergent sonographic characterization.  Chest x-ray 09/04/2013 2-D echo 09/05/2013   Antibiotics: IV Zosyn 09/04/2013 -->  IV Flagyl 09/05/2013 --> 09/13/13 Vancomycin PO 6/14 -->09/13/13 Vancomycin enema 09/13/13 Oral Flagyl 09/13/13   HPI/Subjective: Patient states abdominal pain is improved. No nausea, no vomiting. Patient with multiple loose stools yesterday.   Objective: Filed Vitals:   09/13/13 0630  BP: 148/77  Pulse: 58  Temp: 97.5 F (36.4 C)  Resp: 18    Intake/Output Summary (Last 24 hours) at 09/13/13 1126 Last data filed at 09/13/13 0700  Gross per 24 hour  Intake 1928.33 ml  Output      0 ml  Net 1928.33 ml   Filed Weights   09/11/13 0415 09/12/13 0500 09/13/13 0630  Weight: 70.6 kg (155 lb 10.3 oz) 73.3 kg (161 lb 9.6 oz) 78 kg (171 lb 15.3 oz)    Exam:   General:  NAD  Cardiovascular: RRR  Respiratory: CTAB  Abdomen: Soft/ND/ NTTP/+BS  Musculoskeletal: No clubbing cyanosis or edema.   Data Reviewed: Basic Metabolic Panel:  Recent Labs Lab 09/09/13 0335 09/10/13 0348 09/11/13 0320 09/11/13 0520 09/12/13 0323 09/13/13 0335  NA 140 143 143  --  141 140  K 3.7 3.4* 3.2*  --  4.1 3.6*  CL 109 110 113*  --  111 112  CO2 18* 18* 16*  --  17* 18*  GLUCOSE 76 74 67*  --  101* 103*  BUN 19 17 16   --  15 14  CREATININE 1.46* 1.49* 1.47*  --  1.59* 1.56*  CALCIUM 7.6* 7.9* 7.5*  --  7.7* 7.4*  MG  --   --   --  1.4* 2.1  --    Liver Function Tests:  Recent Labs Lab 09/09/13 0335 09/11/13 0320  AST 10 12  ALT 5 5  ALKPHOS 75 66  BILITOT 0.2* 0.4  PROT 4.8* 4.4*  ALBUMIN 1.6* 1.4*   No results found for this basename: LIPASE, AMYLASE,  in the last 168 hours No results found for this basename: AMMONIA,  in the last 168 hours CBC:  Recent Labs Lab 09/09/13 0335 09/10/13 0348 09/11/13 0320 09/12/13 0323 09/13/13 0335  WBC 19.8* 21.4* 16.8* 18.5* 21.1*   NEUTROABS  --   --   --  13.7* 16.1*  HGB 11.5* 11.4* 10.7* 11.1* 11.1*  HCT 34.3* 35.2* 32.1* 34.0* 34.2*  MCV 89.3 90.3 89.4 93.4 90.0  PLT 354 376 325 354 344   Cardiac Enzymes: No results found for this basename: CKTOTAL, CKMB, CKMBINDEX, TROPONINI,  in the last 168 hours BNP (last 3 results)  Recent Labs  07/25/13 0308  PROBNP 4353.0*   CBG:  Recent Labs Lab 09/10/13 1341 09/10/13 1421  GLUCAP 66* 159*    Recent Results (from the past 240 hour(s))  MRSA PCR SCREENING     Status: None   Collection Time    09/05/13  1:48 AM      Result Value Ref Range Status   MRSA by PCR NEGATIVE  NEGATIVE Final   Comment:            The GeneXpert MRSA Assay (FDA     approved for NASAL specimens     only), is one component of a     comprehensive MRSA colonization     surveillance program. It is not     intended to diagnose MRSA     infection nor to guide or     monitor treatment for     MRSA infections.  CLOSTRIDIUM DIFFICILE BY PCR     Status: Abnormal   Collection Time  09/05/13  4:23 AM      Result Value Ref Range Status   C difficile by pcr POSITIVE (*) NEGATIVE Final   Comment: CRITICAL RESULT CALLED TO, READ BACK BY AND VERIFIED WITH:     WEST P RN 09/05/13 0859 COSTELLO B     Performed at [facility]     Studies: Dg Chest Port 1 View  09/13/2013   CLINICAL DATA:  Leukocytosis  EXAM: PORTABLE CHEST - 1 VIEW  COMPARISON:  09/04/2013  FINDINGS: There is opacity at the left lung base developing since the prior study. This obscures left hemidiaphragm. Is likely combination of a small effusion with either infiltrate or atelectasis.  Lungs are otherwise clear. Cardiac silhouette is normal in size. No mediastinal or hilar masses. Changes from right breast surgery are stable.  IMPRESSION: New left lung base opacity most likely combination of a small effusion with either infiltrate or atelectasis. No other change.   Electronically Signed   By: Lajean Manes M.D.   On:  09/13/2013 08:09    Scheduled Meds: . albuterol  2.5 mg Nebulization BID  . amiodarone  200 mg Oral Daily  . antiseptic oral rinse  15 mL Mouth Rinse q12n4p  . chlorhexidine  15 mL Mouth Rinse BID  . cloNIDine  0.2 mg Transdermal Weekly  . hydrALAZINE  25 mg Oral 3 times per day  . isosorbide mononitrate  120 mg Oral Daily  . metoprolol tartrate  25 mg Oral BID  . metroNIDAZOLE  500 mg Oral 3 times per day  . piperacillin-tazobactam (ZOSYN)  IV  3.375 g Intravenous Q8H  . potassium chloride  40 mEq Oral Q4H  . tiotropium  18 mcg Inhalation Daily  . vancomycin (VANCOCIN) rectal ENEMA  500 mg Rectal 4 times per day   Continuous Infusions: . sodium chloride 50 mL/hr at 09/08/13 0630    Principal Problem:   Acute perforated appendicitis Active Problems:   Clostridium difficile colitis   COPD (chronic obstructive pulmonary disease) with emphysema   Nausea and vomiting   ARF (acute renal failure)   Diastolic CHF, chronic   Chronic respiratory failure with hypoxia   Ventricular tachycardia, sustained   Protein-calorie malnutrition, severe   Wide-complex tachycardia   Hypokalemia   Hypomagnesemia    Time spent: 35 mins    [facility] MD [facility] Pager [phone]. If 7PM-7AM, please contact night-coverage at [email or web address], password [facility] 09/13/2013, 11:26 AM  LOS: 9 days

## 2013-09-13 NOTE — Evaluation (Signed)
Occupational Therapy Evaluation Patient Details Name: Renee Donovan MRN: [id number] DOB: Feb 28, 1931 Today's Date: 09/13/2013    History of Present Illness Pt is an 80 year old female admitted 4/23 for nausea and vomiting with history of COPD, hypothyroidism, chronic alcoholism, tobacco abuse, history of breast cancer, hypertension.  Pt also with toe ulcers suspicious for PVD per chart review.  Pt usually on 2L O2 at home and currently on 2L O2 in room.   Clinical Impression   Pt demon stares decline in function and safety with ADLs and ADL mobilit with decreased strength, balance, endurance and cognition. Pt requires encouragement to participate.  Pt requires extensive assist with ADLs and +2 physical assist for transfers. Pt would benefit from acute OT services to address impairments to increase level of function and safety    Follow Up Recommendations  SNF;Supervision/Assistance - 24 hour    Equipment Recommendations  None recommended by OT;Other (comment) (TBD at next venue of care)    Recommendations for Other Services PT consult     Precautions / Restrictions Precautions Precautions: Fall Restrictions Weight Bearing Restrictions: No      Mobility Bed Mobility Overal bed mobility: Needs Assistance Bed Mobility: Supine to Sit     Supine to sit: Max assist;HOB elevated     General bed mobility comments: assist to wiht B LEs to initiate, assist to elevate trunk  Transfers Overall transfer level: Needs assistance   Transfers: Sit to/from Stand;Stand Pivot Transfers Sit to Stand: Max assist;+2 physical assistance Stand pivot transfers: Max assist;+2 physical assistance       General transfer comment: cues for sequencing, safety. Sit - stand attempted x 3 before pt able to stand with OT and nurse tech. Pt fearful and requires max encouragement to participate.    Balance Overall balance assessment: Needs assistance Sitting-balance support: Single extremity  supported;Feet supported Sitting balance-Leahy Scale: Fair     Standing balance support: Bilateral upper extremity supported;During functional activity Standing balance-Leahy Scale: Zero                              ADL Overall ADL's : Needs assistance/impaired     Grooming: Wash/dry hands;Wash/dry face;Min guard;Sitting   Upper Body Bathing: Minimal assitance;Sitting;Cueing for sequencing   Lower Body Bathing: Maximal assistance;Cueing for sequencing;Sitting/lateral leans   Upper Body Dressing : Minimal assistance;Sitting;Cueing for sequencing   Lower Body Dressing: Total assistance   Toilet Transfer: Cueing for safety;Cueing for sequencing;BSC;+2 for physical assistance;+2 for safety/equipment;Maximal assistance   Toileting- Clothing Manipulation and Hygiene: Total assistance;Sitting/lateral lean;Sit to/from stand;+2 for physical assistance;+2 for safety/equipment       Functional mobility during ADLs: +2 for physical assistance;+2 for safety/equipment;Maximal assistance;Cueing for safety;Cueing for sequencing       Vision  wears reading glasses per pt report                   Perception Perception Perception Tested?: No   Praxis Praxis Praxis tested?: Not tested    Pertinent Vitals/Pain "I hurt all over when I move" per pt. VSS     Hand Dominance Right   Extremity/Trunk Assessment Upper Extremity Assessment Upper Extremity Assessment: Generalized weakness   Lower Extremity Assessment Lower Extremity Assessment: Defer to PT evaluation       Communication Communication Communication: No difficulties   Cognition Arousal/Alertness: Awake/alert Behavior During Therapy: Anxious Overall Cognitive Status: No family/caregiver present to determine baseline cognitive functioning Area of Impairment: Attention;Memory;Following commands;Safety/judgement;Awareness;Problem  solving     Memory: Decreased short-term memory Following Commands:  Follows multi-step commands inconsistently     Problem Solving: Slow processing;Requires verbal cues;Requires tactile cues;Decreased initiation     General Comments   pt not very motivated, confused                 Home Living Family/patient expects to be discharged to:: Skilled nursing facility                                 Additional Comments: pt lived alone pta      Prior Functioning/Environment Level of Independence: Needs assistance             OT Diagnosis: Generalized weakness   OT Problem List: Decreased strength;Decreased knowledge of use of DME or AE;Decreased cognition;Decreased safety awareness;Impaired balance (sitting and/or standing);Pain;Decreased activity tolerance   OT Treatment/Interventions:      OT Goals(Current goals can be found in the care plan section) Acute Rehab OT Goals Patient Stated Goal: go home OT Goal Formulation: With patient Time For Goal Achievement: 09/20/13 Potential to Achieve Goals: Good ADL Goals Pt Will Perform Grooming: with supervision;with set-up;sitting Pt Will Perform Upper Body Bathing: with set-up;with supervision;with min guard assist;sitting Pt Will Perform Lower Body Bathing: with mod assist;sitting/lateral leans Pt Will Perform Upper Body Dressing: with supervision;with set-up;sitting;with min guard assist Pt Will Transfer to Toilet: with mod assist;with +2 assist;bedside commode Additional ADL Goal #1: Pt will complete bed mobility mod - min A to sit EOB in prep for ADLs  OT Frequency:     Barriers to D/C: Inaccessible home environment  pt lives at home alone                     End of Session Equipment Utilized During Treatment: Gait belt Nurse Communication: Mobility status  Activity Tolerance: Patient limited by pain;Patient limited by fatigue Patient left: in chair;with call bell/phone within reach   Time: 9735-3299 OT Time Calculation (min): 39 min Charges:  OT General  Charges $OT Visit: 1 Procedure OT Evaluation $Initial OT Evaluation Tier I: 1 Procedure OT Treatments $Self Care/Home Management : 8-22 mins $Therapeutic Activity: 8-22 mins G-Codes:    Britt Bottom 09/13/2013, 2:05 PM

## 2013-09-13 NOTE — Progress Notes (Signed)
Patient ID: Renee Donovan, female   DOB: [birth year], 80 y.o.   MRN: [id number]    Subjective: Pt feels ok today.  Having a little more left sided tenderness, tolerating full liquids with no issues  Objective: Vital signs in last 24 hours: Temp:  [97.5 F (36.4 C)-97.6 F (36.4 C)] 97.5 F (36.4 C) (06/19 0630) Pulse Rate:  [51-58] 58 (06/19 0630) Resp:  [12-22] 18 (06/19 0630) BP: (148-151)/(50-77) 148/77 mmHg (06/19 0630) SpO2:  [95 %-100 %] 95 % (06/19 0851) Weight:  [171 lb 15.3 oz (78 kg)] 171 lb 15.3 oz (78 kg) (06/19 0630) Last BM Date: 09/12/13  Intake/Output from previous day: 06/18 0701 - 06/19 0700 In: 2528.3 [P.O.:840; I.V.:1238.3; IV Piggyback:450] Out: -  Intake/Output this shift:    PE: Abd: soft, mild left sided tenderness, no right sided tenderness at all, +BS, ND  Lab Results:   Recent Labs  09/12/13 0323 09/13/13 0335  WBC 18.5* 21.1*  HGB 11.1* 11.1*  HCT 34.0* 34.2*  PLT 354 344   BMET  Recent Labs  09/12/13 0323 09/13/13 0335  NA 141 140  K 4.1 3.6*  CL 111 112  CO2 17* 18*  GLUCOSE 101* 103*  BUN 15 14  CREATININE 1.59* 1.56*  CALCIUM 7.7* 7.4*   PT/INR No results found for this basename: LABPROT, INR,  in the last 72 hours CMP     Component Value Date/Time   NA 140 09/13/2013 0335   K 3.6* 09/13/2013 0335   CL 112 09/13/2013 0335   CO2 18* 09/13/2013 0335   GLUCOSE 103* 09/13/2013 0335   BUN 14 09/13/2013 0335   CREATININE 1.56* 09/13/2013 0335   CALCIUM 7.4* 09/13/2013 0335   PROT 4.4* 09/11/2013 0320   ALBUMIN 1.4* 09/11/2013 0320   AST 12 09/11/2013 0320   ALT 5 09/11/2013 0320   ALKPHOS 66 09/11/2013 0320   BILITOT 0.4 09/11/2013 0320   GFRNONAA 30* 09/13/2013 0335   GFRAA 35* 09/13/2013 0335   Lipase     Component Value Date/Time   LIPASE 16 09/04/2013 1953       Studies/Results: Dg Chest Port 1 View  09/13/2013   CLINICAL DATA:  Leukocytosis  EXAM: PORTABLE CHEST - 1 VIEW  COMPARISON:  09/04/2013  FINDINGS: There is  opacity at the left lung base developing since the prior study. This obscures left hemidiaphragm. Is likely combination of a small effusion with either infiltrate or atelectasis.  Lungs are otherwise clear. Cardiac silhouette is normal in size. No mediastinal or hilar masses. Changes from right breast surgery are stable.  IMPRESSION: New left lung base opacity most likely combination of a small effusion with either infiltrate or atelectasis. No other change.   Electronically Signed   By: Lajean Manes M.D.   On: 09/13/2013 08:09    Anti-infectives: Anti-infectives   Start     Dose/Rate Route Frequency Ordered Stop   09/13/13 1400  metroNIDAZOLE (FLAGYL) tablet 500 mg     500 mg Oral 3 times per day 09/13/13 0931     09/13/13 1200  vancomycin (VANCOCIN) 500 mg in sodium chloride irrigation 0.9 % 100 mL ENEMA     500 mg Rectal 4 times per day 09/13/13 0842     09/08/13 1400  piperacillin-tazobactam (ZOSYN) IVPB 3.375 g     3.375 g 12.5 mL/hr over 240 Minutes Intravenous Every 8 hours 09/08/13 0915     09/08/13 1200  vancomycin (VANCOCIN) 50 mg/mL oral solution 250 mg  Status:  Discontinued     250 mg Oral 4 times per day 09/08/13 1016 09/13/13 0842   09/05/13 1000  metroNIDAZOLE (FLAGYL) IVPB 500 mg  Status:  Discontinued     500 mg 100 mL/hr over 60 Minutes Intravenous 3 times per day 09/05/13 0834 09/13/13 0930   09/05/13 0930  metroNIDAZOLE (FLAGYL) IVPB 500 mg  Status:  Discontinued     500 mg 100 mL/hr over 60 Minutes Intravenous Every 8 hours 09/05/13 0927 09/05/13 0939   09/05/13 0800  piperacillin-tazobactam (ZOSYN) IVPB 2.25 g  Status:  Discontinued     2.25 g 100 mL/hr over 30 Minutes Intravenous Every 6 hours 09/05/13 0213 09/08/13 0915   09/05/13 0030  piperacillin-tazobactam (ZOSYN) IVPB 3.375 g     3.375 g 100 mL/hr over 30 Minutes Intravenous  Once 09/05/13 0028 09/05/13 0128       Assessment/Plan  1. Acute perforated appendicitis 2. c diff colitis  Plan: 1. Her  leukocytosis is worsening again.  Dr. Grandville Silos and I have discussed this patient.  We will change her oral vanc to vanc enemas.  We will also switch her IV flagyl to oral flagyl.  Cont zosyn. 2. Cont on full liquids for now. 3. Follow closely, no signs of fulminant c diff colitis.   LOS: 9 days    OSBORNE,KELLY E 09/13/2013, 9:50 AM Pager: [phone]

## 2013-09-13 NOTE — Progress Notes (Signed)
Physical Therapy Discharge Patient Details Name: Renee Donovan MRN: [id number] DOB: 06-09-1930 Today's Date: 09/13/2013 Time:  -     Patient discharged from PT services secondary to patient has refused 3 (three) consecutive times without medical reason.     Progress and discharge plan discussed with patient - patient does not appear to understand the need for therapy, not aware of going to rehab.    GP     Marcelino Freestone PT [phone]  09/13/2013, 9:32 AM

## 2013-09-14 LAB — BASIC METABOLIC PANEL
BUN: 13 mg/dL (ref 6–23)
CHLORIDE: 115 meq/L — AB (ref 96–112)
CO2: 14 mEq/L — ABNORMAL LOW (ref 19–32)
CREATININE: 1.57 mg/dL — AB (ref 0.50–1.10)
Calcium: 7.6 mg/dL — ABNORMAL LOW (ref 8.4–10.5)
GFR calc Af Amer: 35 mL/min — ABNORMAL LOW (ref >90)
GFR calc non Af Amer: 30 mL/min — ABNORMAL LOW (ref >90)
GLUCOSE: 75 mg/dL (ref 70–99)
Potassium: 4.7 mEq/L (ref 3.7–5.3)
Sodium: 143 mEq/L (ref 137–147)

## 2013-09-14 LAB — CBC WITH DIFFERENTIAL/PLATELET
BASOS PCT: 1 % (ref 0–1)
Basophils Absolute: 0.2 10*3/uL — ABNORMAL HIGH (ref 0.0–0.1)
EOS PCT: 1 % (ref 0–5)
Eosinophils Absolute: 0.2 10*3/uL (ref 0.0–0.7)
HEMATOCRIT: 31.6 % — AB (ref 36.0–46.0)
Hemoglobin: 10.4 g/dL — ABNORMAL LOW (ref 12.0–15.0)
LYMPHS ABS: 2.5 10*3/uL (ref 0.7–4.0)
Lymphocytes Relative: 11 % — ABNORMAL LOW (ref 12–46)
MCH: 29.6 pg (ref 26.0–34.0)
MCHC: 32.9 g/dL (ref 30.0–36.0)
MCV: 90 fL (ref 78.0–100.0)
MONOS PCT: 9 % (ref 3–12)
Monocytes Absolute: 2 10*3/uL — ABNORMAL HIGH (ref 0.1–1.0)
NEUTROS ABS: 17.6 10*3/uL — AB (ref 1.7–7.7)
Neutrophils Relative %: 78 % — ABNORMAL HIGH (ref 43–77)
Platelets: 335 10*3/uL (ref 150–400)
RBC: 3.51 MIL/uL — AB (ref 3.87–5.11)
RDW: 15.7 % — ABNORMAL HIGH (ref 11.5–15.5)
WBC: 22.5 10*3/uL — AB (ref 4.0–10.5)

## 2013-09-14 LAB — URINE CULTURE
CULTURE: NO GROWTH
Colony Count: NO GROWTH

## 2013-09-14 MED ORDER — SODIUM CHLORIDE 0.45 % IV SOLN: INTRAVENOUS | Status: DC

## 2013-09-14 MED ORDER — SACCHAROMYCES BOULARDII 250 MG PO CAPS
250.0000 mg | ORAL_CAPSULE | Freq: Two times a day (BID) | ORAL | Status: DC
  Administered 2013-09-14 – 2013-09-23 (×19): 250 mg via ORAL
  Filled 2013-09-14 (×20): qty 1

## 2013-09-14 MED ORDER — SODIUM BICARBONATE 8.4 % IV SOLN
INTRAVENOUS | Status: DC
  Administered 2013-09-14 – 2013-09-16 (×4): via INTRAVENOUS
  Filled 2013-09-14 (×25): qty 150

## 2013-09-14 NOTE — Progress Notes (Signed)
Scheduled 1400 po medication were refused. Reassessed at 1700,  patient unable to take due to vomiting episode. Will not give flagyl or hydralazine due to actively vomiting at this time. Gave patient IV zofran for nausea and emesis. Will monitor

## 2013-09-14 NOTE — Progress Notes (Signed)
[facility] PROGRESS NOTE  Renee Donovan NWG:[id number] DOB: 1930-12-20 DOA: 09/04/2013 PCP: [name], MD  Assessment/Plan: #1 acute appendicitis Patient with some clinical improvement. Per general surgery repeat CT scan showing improvement of  appendicitis. Colitis noted on CT scan. WBC is fluctuating. Afebrile. Continue empiric antibiotics, Zosyn. Patient is tolerating clear liquids, and has been advanced to full liquids per surgery. General surgeon following and appreciate input and recommendations.  #2 C. difficile colitis Patient noted to have multiple loose stools yesterday. WBC is fluctuating and rising. Some improvement in abdominal pain. Continue vancomycin enema day #7/14. Continue oral Flagyl day #9/14.  #3 chronic diastolic CHF Stable. Grade 2 diastolic dysfunction noted on 2-D echo of 09/05/2013 with a preserved EF. Continue beta blocker, imdur, hydralazine. Per cardiology.  #4 wide complex tachycardia Episode noted on the night of 09/08/2013. Patient has been started on amiodarone per cardiology. Amiodarone dose has been changed to 200 mg daily for the next 8 weeks and then DC per cardiology. Continue beta blocker. Outpatient followup with Dr. Percival Spanish of cardiology as outpatient.  #5 COPD with emphysema Stable. Continue Spiriva.  #6 acute on chronic kidney disease stage IV Baseline creatinine approximately 1.8. On admission was 3.05 in the setting of acute perforated appendicitis and also in the setting of ACE inhibitor. ACE inhibitor has been held. Renal function stable and currently at 1.57. Stable. Monitor fluid status.  #7 hypokalemia/hypomagnesemia Likely secondary to GI losses from C. difficile colitis. Repleted.  #8 anemia Likely dilutional in nature. H&H stable. Follow.  #9 hypertension Continue Lopressor, imdur, hydralazine, clonidine patch.  #10 adnexal cystic lesions Will need outpatient abdominal ultrasound followup.  #11  leukocytosis Questionable etiology. Likely secondary to C. difficile colitis. Chest x-ray shows new left lung base opacity most likely combination of small effusion with either infiltrate or atelectasis. Patient has empirically been on IV Zosyn and has no respiratory symptoms and unlikely to be a pneumonia. Urine cultures negative. Continue vancomycin enema. Continue oral Flagyl. Follow.  #12 prophylaxis SCDs for DVT prophylaxis.  Code Status: DO NOT RESUSCITATE Family Communication: Updated patient no family at bedside. Disposition Plan:SNF when medically stable.   Consultants:  Cardiology: Dr. Elias Else 09/05/2013   General surgery: Dr. Excell Seltzer 09/05/2013  Procedures: Ct Abdomen Pelvis Wo Contras 09/05/2013 IMPRESSION: Probable acute versus possibly early perforated appendicitis as evidenced by 2.9 x 2.3 cm inflammatory mass adjacent to the cecum. No abscess or pneumoperitoneum. Low-grade partial small bowel obstruction or, ileus from adjacent appendiceal process. Bilaterals cystic adnexal masses, smaller on the left which would be atypical for neoplasm. Recommend follow-up ultrasound for further characterization. Small right pleural effusion with right lower lobe airspace opacity which may reflect atelectasis, less likely early pneumonia. Severe aortic atherosclerosis was similar multi segmental ectasia. Dg Chest Portable 1 View 09/04/2013 IMPRESSION: No acute abnormality. Probable emphysema  CT abdomen and pelvis without contrast 09/10/2013 IMPRESSION:  Enlarged thickened appendix with mild periappendiceal infiltrative  changes most likely representing a acute appendicitis.  No definite evidence of perforation or abscess.  Bowel wall thickening extending from the rectum to the distal  transverse colon raising question of colitis new since previous  exam.  Cholelithiasis.  Small abdominal aortic aneurysms.  BILATERAL ovarian cysts up to 4.8 cm in size ; recommend followup  nonemergent  sonographic characterization.  Chest x-ray 09/04/2013 2-D echo 09/05/2013   Antibiotics: IV Zosyn 09/04/2013 -->  IV Flagyl 09/05/2013 --> 09/13/13 Vancomycin PO 6/14 -->09/13/13 Vancomycin enema 09/13/13 Oral Flagyl 09/13/13   HPI/Subjective: Patient  states abdominal pain is improved. No nausea, no vomiting. Patient with multiple loose stools yesterday.   Objective: Filed Vitals:   09/14/13 0549  BP: 138/61  Pulse:   Temp:   Resp:     Intake/Output Summary (Last 24 hours) at 09/14/13 1100 Last data filed at 09/13/13 1855  Gross per 24 hour  Intake    690 ml  Output      0 ml  Net    690 ml   Filed Weights   09/12/13 0500 09/13/13 0630 09/14/13 0500  Weight: 73.3 kg (161 lb 9.6 oz) 78 kg (171 lb 15.3 oz) 77.9 kg (171 lb 11.8 oz)    Exam:   General:  NAD  Cardiovascular: RRR  Respiratory: CTAB  Abdomen: Soft/ND/ some left sided TTP/+BS  Musculoskeletal: No clubbing cyanosis or edema.   Data Reviewed: Basic Metabolic Panel:  Recent Labs Lab 09/10/13 0348 09/11/13 0320 09/11/13 0520 09/12/13 0323 09/13/13 0335 09/14/13 0326  NA 143 143  --  141 140 143  K 3.4* 3.2*  --  4.1 3.6* 4.7  CL 110 113*  --  111 112 115*  CO2 18* 16*  --  17* 18* 14*  GLUCOSE 74 67*  --  101* 103* 75  BUN 17 16  --  15 14 13   CREATININE 1.49* 1.47*  --  1.59* 1.56* 1.57*  CALCIUM 7.9* 7.5*  --  7.7* 7.4* 7.6*  MG  --   --  1.4* 2.1  --   --    Liver Function Tests:  Recent Labs Lab 09/09/13 0335 09/11/13 0320  AST 10 12  ALT 5 5  ALKPHOS 75 66  BILITOT 0.2* 0.4  PROT 4.8* 4.4*  ALBUMIN 1.6* 1.4*   No results found for this basename: LIPASE, AMYLASE,  in the last 168 hours No results found for this basename: AMMONIA,  in the last 168 hours CBC:  Recent Labs Lab 09/10/13 0348 09/11/13 0320 09/12/13 0323 09/13/13 0335 09/14/13 0326  WBC 21.4* 16.8* 18.5* 21.1* 22.5*  NEUTROABS  --   --  13.7* 16.1* 17.6*  HGB 11.4* 10.7* 11.1* 11.1* 10.4*  HCT 35.2* 32.1*  34.0* 34.2* 31.6*  MCV 90.3 89.4 93.4 90.0 90.0  PLT 376 325 354 344 335   Cardiac Enzymes: No results found for this basename: CKTOTAL, CKMB, CKMBINDEX, TROPONINI,  in the last 168 hours BNP (last 3 results)  Recent Labs  07/25/13 0308  PROBNP 4353.0*   CBG:  Recent Labs Lab 09/10/13 1341 09/10/13 1421  GLUCAP 66* 159*    Recent Results (from the past 240 hour(s))  MRSA PCR SCREENING     Status: None   Collection Time    09/05/13  1:48 AM      Result Value Ref Range Status   MRSA by PCR NEGATIVE  NEGATIVE Final   Comment:            The GeneXpert MRSA Assay (FDA     approved for NASAL specimens     only), is one component of a     comprehensive MRSA colonization     surveillance program. It is not     intended to diagnose MRSA     infection nor to guide or     monitor treatment for     MRSA infections.  CLOSTRIDIUM DIFFICILE BY PCR     Status: Abnormal   Collection Time    09/05/13  4:23 AM      Result Value Ref  Range Status   C difficile by pcr POSITIVE (*) NEGATIVE Final   Comment: CRITICAL RESULT CALLED TO, READ BACK BY AND VERIFIED WITH:     WEST P RN 09/05/13 0859 COSTELLO B     Performed at [facility]     Studies: Dg Chest Port 1 View  09/13/2013   CLINICAL DATA:  Leukocytosis  EXAM: PORTABLE CHEST - 1 VIEW  COMPARISON:  09/04/2013  FINDINGS: There is opacity at the left lung base developing since the prior study. This obscures left hemidiaphragm. Is likely combination of a small effusion with either infiltrate or atelectasis.  Lungs are otherwise clear. Cardiac silhouette is normal in size. No mediastinal or hilar masses. Changes from right breast surgery are stable.  IMPRESSION: New left lung base opacity most likely combination of a small effusion with either infiltrate or atelectasis. No other change.   Electronically Signed   By: Lajean Manes M.D.   On: 09/13/2013 08:09    Scheduled Meds: . albuterol  2.5 mg Nebulization BID  . amiodarone   200 mg Oral Daily  . antiseptic oral rinse  15 mL Mouth Rinse q12n4p  . chlorhexidine  15 mL Mouth Rinse BID  . cloNIDine  0.2 mg Transdermal Weekly  . hydrALAZINE  25 mg Oral 3 times per day  . isosorbide mononitrate  120 mg Oral Daily  . metoprolol tartrate  25 mg Oral BID  . metroNIDAZOLE  500 mg Oral 3 times per day  . piperacillin-tazobactam (ZOSYN)  IV  3.375 g Intravenous Q8H  . saccharomyces boulardii  250 mg Oral BID  . tiotropium  18 mcg Inhalation Daily  . vancomycin (VANCOCIN) rectal ENEMA  500 mg Rectal 4 times per day   Continuous Infusions: .  sodium bicarbonate  infusion 1000 mL 100 mL/hr at 09/14/13 1031    Principal Problem:   Acute perforated appendicitis Active Problems:   Clostridium difficile colitis   COPD (chronic obstructive pulmonary disease) with emphysema   Nausea and vomiting   ARF (acute renal failure)   Diastolic CHF, chronic   Chronic respiratory failure with hypoxia   Ventricular tachycardia, sustained   Protein-calorie malnutrition, severe   Wide-complex tachycardia   Hypokalemia   Hypomagnesemia    Time spent: 35 mins    [facility] MD [facility] Pager [phone]. If 7PM-7AM, please contact night-coverage at [email or web address], password [facility] 09/14/2013, 11:00 AM  LOS: 10 days

## 2013-09-14 NOTE — Progress Notes (Signed)
Patient 1400 medications not given at scheduled time. Reassessed at 1500 after enema placement.

## 2013-09-14 NOTE — Progress Notes (Signed)
Made Attending MD Grandville Silos aware of patient episode of emesis, not given medication per nausea. Also patient refusal to have IV restarted by Iv Team.(pt is a hard stick). Patient may benefit from [employer] access for continual IV antibiotics.

## 2013-09-14 NOTE — Progress Notes (Signed)
Patient ID: Renee Donovan, female   DOB: 04-Jul-1930, 80 y.o.   MRN: [id number] [facility] Surgery Progress Note:   * No surgery found *  Subjective: Mental status is alert; no complaints.  Frequent loose stools problematic for Vancomycin enemas Objective: Vital signs in last 24 hours: Temp:  [97.3 F (36.3 C)-98.1 F (36.7 C)] 98.1 F (36.7 C) (06/20 0430) Pulse Rate:  [59-66] 59 (06/20 0430) Resp:  [16-22] 16 (06/20 0430) BP: (128-138)/(51-65) 138/61 mmHg (06/20 0549) SpO2:  [97 %-99 %] 97 % (06/20 0853) Weight:  [171 lb 11.8 oz (77.9 kg)] 171 lb 11.8 oz (77.9 kg) (06/20 0500)  Intake/Output from previous day: 06/19 0701 - 06/20 0700 In: 690 [P.O.:240; I.V.:400; IV Piggyback:50] Out: -  Intake/Output this shift:    Physical Exam: Work of breathing is not elevated.  Not complaining of abdominal pain.    Lab Results:  Results for orders placed during the hospital encounter of 09/04/13 (from the past 48 hour(s))  BASIC METABOLIC PANEL     Status: Abnormal   Collection Time    09/13/13  3:35 AM      Result Value Ref Range   Sodium 140  137 - 147 mEq/L   Potassium 3.6 (*) 3.7 - 5.3 mEq/L   Chloride 112  96 - 112 mEq/L   CO2 18 (*) 19 - 32 mEq/L   Glucose, Bld 103 (*) 70 - 99 mg/dL   BUN 14  6 - 23 mg/dL   Creatinine, Ser 1.56 (*) 0.50 - 1.10 mg/dL   Calcium 7.4 (*) 8.4 - 10.5 mg/dL   GFR calc non Af Amer 30 (*) >90 mL/min   GFR calc Af Amer 35 (*) >90 mL/min   Comment: (NOTE)     The eGFR has been calculated using the CKD EPI equation.     This calculation has not been validated in all clinical situations.     eGFR's persistently <90 mL/min signify possible Chronic Kidney     Disease.  CBC WITH DIFFERENTIAL     Status: Abnormal   Collection Time    09/13/13  3:35 AM      Result Value Ref Range   WBC 21.1 (*) 4.0 - 10.5 K/uL   RBC 3.80 (*) 3.87 - 5.11 MIL/uL   Hemoglobin 11.1 (*) 12.0 - 15.0 g/dL   HCT 34.2 (*) 36.0 - 46.0 %   MCV 90.0  78.0 - 100.0 fL   MCH  29.2  26.0 - 34.0 pg   MCHC 32.5  30.0 - 36.0 g/dL   RDW 15.5  11.5 - 15.5 %   Platelets 344  150 - 400 K/uL   Neutrophils Relative % 76  43 - 77 %   Lymphocytes Relative 12  12 - 46 %   Monocytes Relative 10  3 - 12 %   Eosinophils Relative 2  0 - 5 %   Basophils Relative 0  0 - 1 %   Neutro Abs 16.1 (*) 1.7 - 7.7 K/uL   Lymphs Abs 2.5  0.7 - 4.0 K/uL   Monocytes Absolute 2.1 (*) 0.1 - 1.0 K/uL   Eosinophils Absolute 0.4  0.0 - 0.7 K/uL   Basophils Absolute 0.0  0.0 - 0.1 K/uL   WBC Morphology MILD LEFT SHIFT (1-5% METAS, OCC MYELO, OCC BANDS)    URINALYSIS, ROUTINE W REFLEX MICROSCOPIC     Status: Abnormal   Collection Time    09/13/13 10:20 AM      Result  Value Ref Range   Color, Urine AMBER (*) YELLOW   Comment: BIOCHEMICALS MAY BE AFFECTED BY COLOR   APPearance TURBID (*) CLEAR   Specific Gravity, Urine 1.020  1.005 - 1.030   pH 5.0  5.0 - 8.0   Glucose, UA NEGATIVE  NEGATIVE mg/dL   Hgb urine dipstick MODERATE (*) NEGATIVE   Bilirubin Urine NEGATIVE  NEGATIVE   Ketones, ur NEGATIVE  NEGATIVE mg/dL   Protein, ur 30 (*) NEGATIVE mg/dL   Urobilinogen, UA 0.2  0.0 - 1.0 mg/dL   Nitrite POSITIVE (*) NEGATIVE   Leukocytes, UA TRACE (*) NEGATIVE  URINE MICROSCOPIC-ADD ON     Status: Abnormal   Collection Time    09/13/13 10:20 AM      Result Value Ref Range   Squamous Epithelial / LPF RARE  RARE   WBC, UA 0-2  <3 WBC/hpf   RBC / HPF 7-10  <3 RBC/hpf   Bacteria, UA FEW (*) RARE   Crystals URIC ACID CRYSTALS (*) NEGATIVE  CBC WITH DIFFERENTIAL     Status: Abnormal   Collection Time    09/14/13  3:26 AM      Result Value Ref Range   WBC 22.5 (*) 4.0 - 10.5 K/uL   RBC 3.51 (*) 3.87 - 5.11 MIL/uL   Hemoglobin 10.4 (*) 12.0 - 15.0 g/dL   HCT 31.6 (*) 36.0 - 46.0 %   MCV 90.0  78.0 - 100.0 fL   MCH 29.6  26.0 - 34.0 pg   MCHC 32.9  30.0 - 36.0 g/dL   RDW 15.7 (*) 11.5 - 15.5 %   Platelets 335  150 - 400 K/uL   Neutrophils Relative % 78 (*) 43 - 77 %   Lymphocytes  Relative 11 (*) 12 - 46 %   Monocytes Relative 9  3 - 12 %   Eosinophils Relative 1  0 - 5 %   Basophils Relative 1  0 - 1 %   Neutro Abs 17.6 (*) 1.7 - 7.7 K/uL   Lymphs Abs 2.5  0.7 - 4.0 K/uL   Monocytes Absolute 2.0 (*) 0.1 - 1.0 K/uL   Eosinophils Absolute 0.2  0.0 - 0.7 K/uL   Basophils Absolute 0.2 (*) 0.0 - 0.1 K/uL   WBC Morphology MILD LEFT SHIFT (1-5% METAS, OCC MYELO, OCC BANDS)    BASIC METABOLIC PANEL     Status: Abnormal   Collection Time    09/14/13  3:26 AM      Result Value Ref Range   Sodium 143  137 - 147 mEq/L   Potassium 4.7  3.7 - 5.3 mEq/L   Comment: DELTA CHECK NOTED     NO VISIBLE HEMOLYSIS     REPEATED TO VERIFY   Chloride 115 (*) 96 - 112 mEq/L   CO2 14 (*) 19 - 32 mEq/L   Glucose, Bld 75  70 - 99 mg/dL   BUN 13  6 - 23 mg/dL   Creatinine, Ser 1.57 (*) 0.50 - 1.10 mg/dL   Calcium 7.6 (*) 8.4 - 10.5 mg/dL   GFR calc non Af Amer 30 (*) >90 mL/min   GFR calc Af Amer 35 (*) >90 mL/min   Comment: (NOTE)     The eGFR has been calculated using the CKD EPI equation.     This calculation has not been validated in all clinical situations.     eGFR's persistently <90 mL/min signify possible Chronic Kidney     Disease.  Radiology/Results: Dg Chest Port 1 View  09/13/2013   CLINICAL DATA:  Leukocytosis  EXAM: PORTABLE CHEST - 1 VIEW  COMPARISON:  09/04/2013  FINDINGS: There is opacity at the left lung base developing since the prior study. This obscures left hemidiaphragm. Is likely combination of a small effusion with either infiltrate or atelectasis.  Lungs are otherwise clear. Cardiac silhouette is normal in size. No mediastinal or hilar masses. Changes from right breast surgery are stable.  IMPRESSION: New left lung base opacity most likely combination of a small effusion with either infiltrate or atelectasis. No other change.   Electronically Signed   By: Lajean Manes M.D.   On: 09/13/2013 08:09    Anti-infectives: Anti-infectives   Start     Dose/Rate  Route Frequency Ordered Stop   09/13/13 1400  metroNIDAZOLE (FLAGYL) tablet 500 mg     500 mg Oral 3 times per day 09/13/13 0931     09/13/13 1200  vancomycin (VANCOCIN) 500 mg in sodium chloride irrigation 0.9 % 100 mL ENEMA     500 mg Rectal 4 times per day 09/13/13 0842     09/08/13 1400  piperacillin-tazobactam (ZOSYN) IVPB 3.375 g     3.375 g 12.5 mL/hr over 240 Minutes Intravenous Every 8 hours 09/08/13 0915     09/08/13 1200  vancomycin (VANCOCIN) 50 mg/mL oral solution 250 mg  Status:  Discontinued     250 mg Oral 4 times per day 09/08/13 1016 09/13/13 0842   09/05/13 1000  metroNIDAZOLE (FLAGYL) IVPB 500 mg  Status:  Discontinued     500 mg 100 mL/hr over 60 Minutes Intravenous 3 times per day 09/05/13 0834 09/13/13 0930   09/05/13 0930  metroNIDAZOLE (FLAGYL) IVPB 500 mg  Status:  Discontinued     500 mg 100 mL/hr over 60 Minutes Intravenous Every 8 hours 09/05/13 0927 09/05/13 0939   09/05/13 0800  piperacillin-tazobactam (ZOSYN) IVPB 2.25 g  Status:  Discontinued     2.25 g 100 mL/hr over 30 Minutes Intravenous Every 6 hours 09/05/13 0213 09/08/13 0915   09/05/13 0030  piperacillin-tazobactam (ZOSYN) IVPB 3.375 g     3.375 g 100 mL/hr over 30 Minutes Intravenous  Once 09/05/13 0028 09/05/13 0128      Assessment/Plan: Problem List: Patient Active Problem List   Diagnosis Date Noted  . Hypokalemia 09/11/2013  . Hypomagnesemia 09/11/2013  . Clostridium difficile colitis 09/11/2013  . Wide-complex tachycardia 09/09/2013  . Diastolic CHF, chronic 67/61/9509  . Chronic respiratory failure with hypoxia 09/05/2013  . Acute perforated appendicitis 09/05/2013  . Ventricular tachycardia, sustained 09/05/2013  . Abdominal pain, acute, bilateral lower quadrant 09/05/2013  . Protein-calorie malnutrition, severe 09/05/2013  . Acute diastolic CHF (congestive heart failure) 07/25/2013  . COPD exacerbation 07/25/2013  . Acute and chronic respiratory failure 07/21/2013  . ARF  (acute renal failure) 07/21/2013  . Nausea and vomiting 07/18/2013  . Toe ulcer 07/18/2013  . Accelerated hypertension 07/18/2013  . Nausea & vomiting 07/18/2013  . COPD (chronic obstructive pulmonary disease) with emphysema 01/02/2013  . Adjustment disorder with mixed anxiety and depressed mood 07/24/2011  . Alcohol abuse 07/24/2011  . Hypothyroidism 07/21/2011  . B12 deficiency 10/27/2010  . CIGARETTE SMOKER 11/13/2007  . HYPERTENSION 04/20/2007  . BREAST CANCER, HX OF 04/20/2007    WBC up but patient is no worse with C dif mainly.   * No surgery found *    LOS: 10 days   Matt B. Hassell Done, MD, [facility] [phone] beeper [phone]  09/14/2013 10:18 AM

## 2013-09-14 NOTE — Progress Notes (Signed)
ANTIBIOTIC CONSULT NOTE - FOLLOW UP  Pharmacy Consult for Zosyn Indication: Intra-abdominal infection  No Known Allergies  Patient Measurements: Height: 5\' 6"  (167.6 cm) Weight: 171 lb 11.8 oz (77.9 kg) IBW/kg (Calculated) : 59.3  Vital Signs: Temp: 98.1 F (36.7 C) (06/20 0430) Temp src: Oral (06/20 0430) BP: 138/61 mmHg (06/20 0549) Pulse Rate: 59 (06/20 0430) Intake/Output from previous day: 06/19 0701 - 06/20 0700 In: 690 [P.O.:240; I.V.:400; IV Piggyback:50] Out: -  Intake/Output from this shift:    Labs:  Recent Labs  09/12/13 0323 09/13/13 0335 09/14/13 0326  WBC 18.5* 21.1* 22.5*  HGB 11.1* 11.1* 10.4*  PLT 354 344 335  CREATININE 1.59* 1.56* 1.57*   Estimated Creatinine Clearance: 29.6 ml/min (by C-G formula based on Cr of 1.57). No results found for this basename: VANCOTROUGH, Corlis Leak, VANCORANDOM, [place], GENTPEAK, GENTRANDOM, TOBRATROUGH, TOBRAPEAK, TOBRARND, AMIKACINPEAK, AMIKACINTROU, AMIKACIN,  in the last 72 hours   Microbiology: Recent Results (from the past 720 hour(s))  MRSA PCR SCREENING     Status: None   Collection Time    09/05/13  1:48 AM      Result Value Ref Range Status   MRSA by PCR NEGATIVE  NEGATIVE Final   Comment:            The GeneXpert MRSA Assay (FDA     approved for NASAL specimens     only), is one component of a     comprehensive MRSA colonization     surveillance program. It is not     intended to diagnose MRSA     infection nor to guide or     monitor treatment for     MRSA infections.  CLOSTRIDIUM DIFFICILE BY PCR     Status: Abnormal   Collection Time    09/05/13  4:23 AM      Result Value Ref Range Status   C difficile by pcr POSITIVE (*) NEGATIVE Final   Comment: CRITICAL RESULT CALLED TO, READ BACK BY AND VERIFIED WITH:     WEST P RN 09/05/13 0859 COSTELLO B     Performed at [facility]    Assessment: 80yo female who presents to the emergency room with n/v/d x ~ 2 weeks. On antibiotics for  subacute perforated appendicits vs cecal diverticulitis and CDiff. CCS plan is to continue non-op mgmnt with IV abx for now.  Anti-infectives 6/11 >>zosyn >> 6/11 >>metronidazole(PO) >>  6/14 >> vancomycin (PR) >>  Vitals/Labs Tmax: afeb WBCs: 22.5 (increasing) Renal: AoCKD-IV( baseline SCr 1.8 )- SCr 1.57, CrCl 30  Microbiology 6/11: c. Diff: POSITIVE 6/19: Urine:collected 6/19: Blood: collected  Drug level / dose changes info 6/14: Increase zosyn q6h -->ZEI q8h  Goal of Therapy:  Eradication of infection; Zosyn dose per renal function  Plan:   Continue Zosyn 3.375gm IV q8h (4hr extended infusions) Follow up renal function & cultures Follow up duration of therapy   Dolly Rias RPh 09/14/2013, 11:32 AM Pager [phone]

## 2013-09-15 LAB — CBC WITH DIFFERENTIAL/PLATELET
BASOS ABS: 0 10*3/uL (ref 0.0–0.1)
Basophils Relative: 0 % (ref 0–1)
Eosinophils Absolute: 0.2 10*3/uL (ref 0.0–0.7)
Eosinophils Relative: 1 % (ref 0–5)
HCT: 29.2 % — ABNORMAL LOW (ref 36.0–46.0)
Hemoglobin: 10.2 g/dL — ABNORMAL LOW (ref 12.0–15.0)
Lymphocytes Relative: 11 % — ABNORMAL LOW (ref 12–46)
Lymphs Abs: 2.4 10*3/uL (ref 0.7–4.0)
MCH: 30.6 pg (ref 26.0–34.0)
MCHC: 34.9 g/dL (ref 30.0–36.0)
MCV: 87.7 fL (ref 78.0–100.0)
Monocytes Absolute: 2.2 10*3/uL — ABNORMAL HIGH (ref 0.1–1.0)
Monocytes Relative: 10 % (ref 3–12)
NEUTROS ABS: 17 10*3/uL — AB (ref 1.7–7.7)
Neutrophils Relative %: 78 % — ABNORMAL HIGH (ref 43–77)
Platelets: 378 10*3/uL (ref 150–400)
RBC: 3.33 MIL/uL — ABNORMAL LOW (ref 3.87–5.11)
RDW: 15.6 % — AB (ref 11.5–15.5)
WBC Morphology: INCREASED
WBC: 21.8 10*3/uL — ABNORMAL HIGH (ref 4.0–10.5)

## 2013-09-15 LAB — BASIC METABOLIC PANEL
BUN: 14 mg/dL (ref 6–23)
CALCIUM: 7.2 mg/dL — AB (ref 8.4–10.5)
CO2: 19 meq/L (ref 19–32)
Chloride: 108 mEq/L (ref 96–112)
Creatinine, Ser: 1.6 mg/dL — ABNORMAL HIGH (ref 0.50–1.10)
GFR calc Af Amer: 34 mL/min — ABNORMAL LOW (ref >90)
GFR calc non Af Amer: 29 mL/min — ABNORMAL LOW (ref >90)
GLUCOSE: 99 mg/dL (ref 70–99)
POTASSIUM: 3.9 meq/L (ref 3.7–5.3)
Sodium: 140 mEq/L (ref 137–147)

## 2013-09-15 MED ORDER — ENSURE COMPLETE PO LIQD
237.0000 mL | Freq: Three times a day (TID) | ORAL | Status: DC
  Administered 2013-09-15 – 2013-09-17 (×5): 237 mL via ORAL

## 2013-09-15 MED ORDER — CIPROFLOXACIN HCL 500 MG PO TABS
500.0000 mg | ORAL_TABLET | Freq: Two times a day (BID) | ORAL | Status: AC
  Administered 2013-09-15 – 2013-09-18 (×7): 500 mg via ORAL
  Filled 2013-09-15 (×8): qty 1

## 2013-09-15 MED ORDER — LACTATED RINGERS IV BOLUS (SEPSIS): 1000.0000 mL | Freq: Three times a day (TID) | INTRAVENOUS | Status: AC | PRN

## 2013-09-15 NOTE — Progress Notes (Signed)
[address]  [address] [place], [place] [address] Phone: [phone] FAX: [phone]    THERESEA Donovan [id number] 05/02/1930  CARE TEAM:  PCP: Renee Hare, MD  Outpatient Care Team: Patient Care Team: Renee Rhymes, MD as PCP - General (Internal Medicine) Renee Stain, MD (Pulmonary Disease)  Inpatient Treatment Team: Treatment Team: Attending Provider: Eugenie Filler, MD; Consulting Physician: Renee Nations, MD; Physician Assistant: Renee Donovan, NT; Registered Nurse: Kennieth Rad, RN; Rounding Team: Threasa Beards, MD; Registered Nurse: Renee Au, RN; Technician: Renee Donovan, NT; Registered Nurse: Renee Curia, RN; Registered Nurse: Renee Riles, RN; Technician: Renee Donovan, NT; Technician: Renee Donovan, NT; Registered Nurse: Renee Argue, RN   Subjective:  Emesis yesterday Tolerated breakfast well this AM  Objective:  Vital signs:  Filed Vitals:   09/14/13 2121 09/14/13 2122 09/15/13 0500 09/15/13 0543  BP: 112/45 112/45 140/57 140/57  Pulse: 84  64   Temp:   98.5 F (36.9 C)   TempSrc:      Resp:   18   Height:      Weight:      SpO2:   96%     Last BM Date: 09/12/13  Intake/Output   Yesterday:  06/20 0701 - 06/21 0700 In: 2843.3 [P.O.:600; I.V.:1843.3; IV Piggyback:200] Out: 1050 [Emesis/NG output:500; Stool:550] This shift:     Bowel function:  Flatus: Y  BM: Y  Drain: n/a  Physical Exam:  General: Pt awake/alert/oriented x4 in no acute distress Eyes: PERRL, normal EOM.  Sclera clear.  No icterus Neuro: CN II-XII intact w/o focal sensory/motor deficits. Lymph: No head/neck/groin lymphadenopathy Psych:  No delerium/psychosis/paranoia HENT: Normocephalic, Mucus membranes moist.  No thrush Neck: Supple, No tracheal deviation Chest: No chest wall pain w good excursion CV:  Pulses intact.  Regular rhythm MS: Normal AROM mjr joints.  No obvious  deformity Abdomen: Soft.  Nondistended.  Mildly tender diffuse.  No evidence of guarding, peritonitis.  No incarcerated hernias. Ext:  SCDs BLE.  No mjr edema.  No cyanosis Skin: No petechiae / purpura   Problem List:   Principal Problem:   Acute perforated appendicitis Active Problems:   COPD (chronic obstructive pulmonary disease) with emphysema   Nausea and vomiting   ARF (acute renal failure)   Diastolic CHF, chronic   Chronic respiratory failure with hypoxia   Ventricular tachycardia, sustained   Protein-calorie malnutrition, severe   Wide-complex tachycardia   Hypokalemia   Hypomagnesemia   Clostridium difficile colitis   Assessment  Renee Donovan  80 y.o. female       Appendicitis improving by CT scan  CDiff colitis moderate but clinically imrpvong  Plan:  -IV Zosyn x 10days for appendicitis/cecal diverticulitis.  If tol PO solids, can switch to PO Cipro/Flagyl -Cdiff a little better w PO flagyl & PR Vanco.  Consider ID or GI eval if not better -probiotics -bowel regimen (eventually) -CT scan if worse -VTE prophylaxis- SCDs, etc -mobilize as tolerated to help recovery  The patient is stable.  There is no evidence of peritonitis, acute abdomen, nor shock.  There is no strong evidence of failure of improvement nor decline with current non-operative management.  There is no need for surgery at the present moment.   We will continue to follow.   Renee Donovan, M.D., F.A.C.S. Gastrointestinal and Minimally Invasive Surgery [facility] [address] [place],  [address] [phone]  Main / Paging   09/15/2013   Results:   Labs: Results for orders placed during the hospital encounter of 09/04/13 (from the past 48 hour(s))  CBC WITH DIFFERENTIAL     Status: Abnormal   Collection Time    09/14/13  3:26 AM      Result Value Ref Range   WBC 22.5 (*) 4.0 - 10.5 K/uL   RBC 3.51 (*) 3.87 - 5.11 MIL/uL    Hemoglobin 10.4 (*) 12.0 - 15.0 g/dL   HCT 31.6 (*) 36.0 - 46.0 %   MCV 90.0  78.0 - 100.0 fL   MCH 29.6  26.0 - 34.0 pg   MCHC 32.9  30.0 - 36.0 g/dL   RDW 15.7 (*) 11.5 - 15.5 %   Platelets 335  150 - 400 K/uL   Neutrophils Relative % 78 (*) 43 - 77 %   Lymphocytes Relative 11 (*) 12 - 46 %   Monocytes Relative 9  3 - 12 %   Eosinophils Relative 1  0 - 5 %   Basophils Relative 1  0 - 1 %   Neutro Abs 17.6 (*) 1.7 - 7.7 K/uL   Lymphs Abs 2.5  0.7 - 4.0 K/uL   Monocytes Absolute 2.0 (*) 0.1 - 1.0 K/uL   Eosinophils Absolute 0.2  0.0 - 0.7 K/uL   Basophils Absolute 0.2 (*) 0.0 - 0.1 K/uL   WBC Morphology MILD LEFT SHIFT (1-5% METAS, OCC MYELO, OCC BANDS)    BASIC METABOLIC PANEL     Status: Abnormal   Collection Time    09/14/13  3:26 AM      Result Value Ref Range   Sodium 143  137 - 147 mEq/L   Potassium 4.7  3.7 - 5.3 mEq/L   Comment: DELTA CHECK NOTED     NO VISIBLE HEMOLYSIS     REPEATED TO VERIFY   Chloride 115 (*) 96 - 112 mEq/L   CO2 14 (*) 19 - 32 mEq/L   Glucose, Bld 75  70 - 99 mg/dL   BUN 13  6 - 23 mg/dL   Creatinine, Ser 1.57 (*) 0.50 - 1.10 mg/dL   Calcium 7.6 (*) 8.4 - 10.5 mg/dL   GFR calc non Af Amer 30 (*) >90 mL/min   GFR calc Af Amer 35 (*) >90 mL/min   Comment: (NOTE)     The eGFR has been calculated using the CKD EPI equation.     This calculation has not been validated in all clinical situations.     eGFR's persistently <90 mL/min signify possible Chronic Kidney     Disease.  CBC WITH DIFFERENTIAL     Status: Abnormal   Collection Time    09/15/13  3:40 AM      Result Value Ref Range   WBC 21.8 (*) 4.0 - 10.5 K/uL   RBC 3.33 (*) 3.87 - 5.11 MIL/uL   Hemoglobin 10.2 (*) 12.0 - 15.0 g/dL   HCT 29.2 (*) 36.0 - 46.0 %   MCV 87.7  78.0 - 100.0 fL   MCH 30.6  26.0 - 34.0 pg   MCHC 34.9  30.0 - 36.0 g/dL   RDW 15.6 (*) 11.5 - 15.5 %   Platelets 378  150 - 400 K/uL   Neutrophils Relative % 78 (*) 43 - 77 %   Lymphocytes Relative 11 (*) 12 - 46 %    Monocytes Relative 10  3 - 12 %   Eosinophils Relative 1  0 -  5 %   Basophils Relative 0  0 - 1 %   Neutro Abs 17.0 (*) 1.7 - 7.7 K/uL   Lymphs Abs 2.4  0.7 - 4.0 K/uL   Monocytes Absolute 2.2 (*) 0.1 - 1.0 K/uL   Eosinophils Absolute 0.2  0.0 - 0.7 K/uL   Basophils Absolute 0.0  0.0 - 0.1 K/uL   RBC Morphology ELLIPTOCYTES     WBC Morphology INCREASED BANDS (>20% BANDS)    BASIC METABOLIC PANEL     Status: Abnormal   Collection Time    09/15/13  3:40 AM      Result Value Ref Range   Sodium 140  137 - 147 mEq/L   Potassium 3.9  3.7 - 5.3 mEq/L   Comment: DELTA CHECK NOTED   Chloride 108  96 - 112 mEq/L   CO2 19  19 - 32 mEq/L   Glucose, Bld 99  70 - 99 mg/dL   BUN 14  6 - 23 mg/dL   Creatinine, Ser 1.60 (*) 0.50 - 1.10 mg/dL   Calcium 7.2 (*) 8.4 - 10.5 mg/dL   GFR calc non Af Amer 29 (*) >90 mL/min   GFR calc Af Amer 34 (*) >90 mL/min   Comment: (NOTE)     The eGFR has been calculated using the CKD EPI equation.     This calculation has not been validated in all clinical situations.     eGFR's persistently <90 mL/min signify possible Chronic Kidney     Disease.    Imaging / Studies: No results found.  Medications / Allergies: per chart  Antibiotics: Anti-infectives   Start     Dose/Rate Route Frequency Ordered Stop   09/13/13 1400  metroNIDAZOLE (FLAGYL) tablet 500 mg     500 mg Oral 3 times per day 09/13/13 0931     09/13/13 1200  vancomycin (VANCOCIN) 500 mg in sodium chloride irrigation 0.9 % 100 mL ENEMA     500 mg Rectal 4 times per day 09/13/13 0842     09/08/13 1400  piperacillin-tazobactam (ZOSYN) IVPB 3.375 g     3.375 g 12.5 mL/hr over 240 Minutes Intravenous Every 8 hours 09/08/13 0915     09/08/13 1200  vancomycin (VANCOCIN) 50 mg/mL oral solution 250 mg  Status:  Discontinued     250 mg Oral 4 times per day 09/08/13 1016 09/13/13 0842   09/05/13 1000  metroNIDAZOLE (FLAGYL) IVPB 500 mg  Status:  Discontinued     500 mg 100 mL/hr over 60 Minutes  Intravenous 3 times per day 09/05/13 0834 09/13/13 0930   09/05/13 0930  metroNIDAZOLE (FLAGYL) IVPB 500 mg  Status:  Discontinued     500 mg 100 mL/hr over 60 Minutes Intravenous Every 8 hours 09/05/13 0927 09/05/13 0939   09/05/13 0800  piperacillin-tazobactam (ZOSYN) IVPB 2.25 g  Status:  Discontinued     2.25 g 100 mL/hr over 30 Minutes Intravenous Every 6 hours 09/05/13 0213 09/08/13 0915   09/05/13 0030  piperacillin-tazobactam (ZOSYN) IVPB 3.375 g     3.375 g 100 mL/hr over 30 Minutes Intravenous  Once 09/05/13 0028 09/05/13 0128       Note: This dictation was prepared with voice recognition software technology. In this process, transcriptional errors may occur.  Attempts are made to proofread & provide accurate documentation.  Any errors are unintentional.

## 2013-09-15 NOTE — Progress Notes (Signed)
Pt refused her am breathing tx. Pt in no distress at this time.

## 2013-09-15 NOTE — Progress Notes (Signed)
[facility] PROGRESS NOTE  Renee Donovan PYP:[id number] DOB: 20-May-1930 DOA: 09/04/2013 PCP: [name], MD  Assessment/Plan: #1 acute appendicitis Patient with some clinical improvement. Per general surgery repeat CT scan showing improvement of appendicitis. Colitis noted on CT scan. WBC is fluctuating. Afebrile. Change IV Zosyn to oral ciprofloxacin. Patient on oral flagyl. Patient is tolerating clear liquids, and has been advanced to full liquids per surgery. General surgeon following and appreciate input and recommendations.  #2 C. difficile colitis Patient noted to have multiple loose stools yesterday. WBC is fluctuating. Some improvement in abdominal pain. Continue vancomycin enema day #8/14. Continue oral Flagyl day #10/14.  #3 chronic diastolic CHF Stable. Grade 2 diastolic dysfunction noted on 2-D echo of 09/05/2013 with a preserved EF. Continue beta blocker, imdur, hydralazine. Per cardiology.  #4 wide complex tachycardia Episode noted on the night of 09/08/2013. Patient has been started on amiodarone per cardiology. Amiodarone dose has been changed to 200 mg daily for the next 8 weeks and then DC per cardiology. Continue beta blocker. Outpatient followup with Dr. Percival Spanish of cardiology as outpatient.  #5 COPD with emphysema Stable. Continue Spiriva.  #6 acute on chronic kidney disease stage IV Baseline creatinine approximately 1.8. On admission was 3.05 in the setting of acute perforated appendicitis and also in the setting of ACE inhibitor. ACE inhibitor has been held. Renal function stable and currently at 1.60. Stable. Monitor fluid status.  #7 hypokalemia/hypomagnesemia Likely secondary to GI losses from C. difficile colitis. Repleted.  #8 anemia Likely dilutional in nature. H&H stable. Follow.  #9 hypertension Continue Lopressor, imdur, hydralazine, clonidine patch.  #10 adnexal cystic lesions Will need outpatient abdominal ultrasound followup.  #11  leukocytosis Questionable etiology. Likely secondary to C. difficile colitis. Chest x-ray shows new left lung base opacity most likely combination of small effusion with either infiltrate or atelectasis. Patient has empirically been on IV Zosyn and has no respiratory symptoms and unlikely to be a pneumonia. Urine cultures negative. Continue vancomycin enema. Continue oral Flagyl. Change IV Zosyn to oral ciprofloxacin. Follow.  #12 prophylaxis SCDs for DVT prophylaxis.  Code Status: DO NOT RESUSCITATE Family Communication: Updated patient no family at bedside. Disposition Plan:SNF when medically stable.   Consultants:  Cardiology: Dr. Elias Else 09/05/2013   General surgery: Dr. Excell Seltzer 09/05/2013  Procedures: Ct Abdomen Pelvis Wo Contras 09/05/2013 IMPRESSION: Probable acute versus possibly early perforated appendicitis as evidenced by 2.9 x 2.3 cm inflammatory mass adjacent to the cecum. No abscess or pneumoperitoneum. Low-grade partial small bowel obstruction or, ileus from adjacent appendiceal process. Bilaterals cystic adnexal masses, smaller on the left which would be atypical for neoplasm. Recommend follow-up ultrasound for further characterization. Small right pleural effusion with right lower lobe airspace opacity which may reflect atelectasis, less likely early pneumonia. Severe aortic atherosclerosis was similar multi segmental ectasia. Dg Chest Portable 1 View 09/04/2013 IMPRESSION: No acute abnormality. Probable emphysema  CT abdomen and pelvis without contrast 09/10/2013 IMPRESSION:  Enlarged thickened appendix with mild periappendiceal infiltrative  changes most likely representing a acute appendicitis.  No definite evidence of perforation or abscess.  Bowel wall thickening extending from the rectum to the distal  transverse colon raising question of colitis new since previous  exam.  Cholelithiasis.  Small abdominal aortic aneurysms.  BILATERAL ovarian cysts up to 4.8 cm in  size ; recommend followup  nonemergent sonographic characterization.  Chest x-ray 09/04/2013 2-D echo 09/05/2013   Antibiotics: IV Zosyn 09/04/2013 -->09/15/13  IV Flagyl 09/05/2013 --> 09/13/13 Vancomycin PO 6/14 -->09/13/13 Vancomycin  enema 09/13/13 Oral Flagyl 09/13/13 Oral cipro 09/15/13   HPI/Subjective: Patient states abdominal pain is improved. No nausea, no vomiting. Patient with multiple loose stools yesterday.   Objective: Filed Vitals:   09/15/13 0543  BP: 140/57  Pulse:   Temp:   Resp:     Intake/Output Summary (Last 24 hours) at 09/15/13 1017 Last data filed at 09/15/13 0539  Gross per 24 hour  Intake 2603.33 ml  Output   1050 ml  Net 1553.33 ml   Filed Weights   09/12/13 0500 09/13/13 0630 09/14/13 0500  Weight: 73.3 kg (161 lb 9.6 oz) 78 kg (171 lb 15.3 oz) 77.9 kg (171 lb 11.8 oz)    Exam:   General:  NAD  Cardiovascular: RRR  Respiratory: CTAB  Abdomen: Soft/ND/ some left sided TTP/+BS  Musculoskeletal: No clubbing cyanosis or edema.   Data Reviewed: Basic Metabolic Panel:  Recent Labs Lab 09/11/13 0320 09/11/13 0520 09/12/13 0323 09/13/13 0335 09/14/13 0326 09/15/13 0340  NA 143  --  141 140 143 140  K 3.2*  --  4.1 3.6* 4.7 3.9  CL 113*  --  111 112 115* 108  CO2 16*  --  17* 18* 14* 19  GLUCOSE 67*  --  101* 103* 75 99  BUN 16  --  15 14 13 14   CREATININE 1.47*  --  1.59* 1.56* 1.57* 1.60*  CALCIUM 7.5*  --  7.7* 7.4* 7.6* 7.2*  MG  --  1.4* 2.1  --   --   --    Liver Function Tests:  Recent Labs Lab 09/09/13 0335 09/11/13 0320  AST 10 12  ALT 5 5  ALKPHOS 75 66  BILITOT 0.2* 0.4  PROT 4.8* 4.4*  ALBUMIN 1.6* 1.4*   No results found for this basename: LIPASE, AMYLASE,  in the last 168 hours No results found for this basename: AMMONIA,  in the last 168 hours CBC:  Recent Labs Lab 09/11/13 0320 09/12/13 0323 09/13/13 0335 09/14/13 0326 09/15/13 0340  WBC 16.8* 18.5* 21.1* 22.5* 21.8*  NEUTROABS  --  13.7*  16.1* 17.6* 17.0*  HGB 10.7* 11.1* 11.1* 10.4* 10.2*  HCT 32.1* 34.0* 34.2* 31.6* 29.2*  MCV 89.4 93.4 90.0 90.0 87.7  PLT 325 354 344 335 378   Cardiac Enzymes: No results found for this basename: CKTOTAL, CKMB, CKMBINDEX, TROPONINI,  in the last 168 hours BNP (last 3 results)  Recent Labs  07/25/13 0308  PROBNP 4353.0*   CBG:  Recent Labs Lab 09/10/13 1341 09/10/13 1421  GLUCAP 66* 159*    Recent Results (from the past 240 hour(s))  CULTURE, BLOOD (ROUTINE X 2)     Status: None   Collection Time    09/13/13  7:25 AM      Result Value Ref Range Status   Specimen Description BLOOD LEFT ARM   Final   Special Requests BOTTLES DRAWN AEROBIC ONLY 1 CC   Final   Culture  Setup Time     Final   Value: 09/13/2013 12:01     Performed at [facility]   Culture     Final   Value:        BLOOD CULTURE RECEIVED NO GROWTH TO DATE CULTURE WILL BE HELD FOR 5 DAYS BEFORE ISSUING A FINAL NEGATIVE REPORT     Performed at [facility]   Report Status PENDING   Incomplete  CULTURE, BLOOD (ROUTINE X 2)     Status: None   Collection Time  09/13/13  8:29 AM      Result Value Ref Range Status   Specimen Description BLOOD LEFT AC   Final   Special Requests BOTTLES DRAWN AEROBIC AND ANAEROBIC 6 CC   Final   Culture  Setup Time     Final   Value: 09/13/2013 12:01     Performed at [facility]   Culture     Final   Value:        BLOOD CULTURE RECEIVED NO GROWTH TO DATE CULTURE WILL BE HELD FOR 5 DAYS BEFORE ISSUING A FINAL NEGATIVE REPORT     Performed at [facility]   Report Status PENDING   Incomplete  URINE CULTURE     Status: None   Collection Time    09/13/13 10:20 AM      Result Value Ref Range Status   Specimen Description URINE, CATHETERIZED   Final   Special Requests NONE   Final   Culture  Setup Time     Final   Value: 09/13/2013 12:30     Performed at [facility] Count     Final   Value: NO GROWTH     Performed at  [facility]   Culture     Final   Value: NO GROWTH     Performed at [facility]   Report Status 09/14/2013 FINAL   Final     Studies: No results found.  Scheduled Meds: . albuterol  2.5 mg Nebulization BID  . amiodarone  200 mg Oral Daily  . antiseptic oral rinse  15 mL Mouth Rinse q12n4p  . chlorhexidine  15 mL Mouth Rinse BID  . cloNIDine  0.2 mg Transdermal Weekly  . hydrALAZINE  25 mg Oral 3 times per day  . isosorbide mononitrate  120 mg Oral Daily  . metoprolol tartrate  25 mg Oral BID  . metroNIDAZOLE  500 mg Oral 3 times per day  . piperacillin-tazobactam (ZOSYN)  IV  3.375 g Intravenous Q8H  . saccharomyces boulardii  250 mg Oral BID  . tiotropium  18 mcg Inhalation Daily  . vancomycin (VANCOCIN) rectal ENEMA  500 mg Rectal 4 times per day   Continuous Infusions: .  sodium bicarbonate  infusion 1000 mL 100 mL/hr at 09/15/13 0224    Principal Problem:   Acute perforated appendicitis Active Problems:   Clostridium difficile colitis   COPD (chronic obstructive pulmonary disease) with emphysema   Nausea and vomiting   ARF (acute renal failure)   Diastolic CHF, chronic   Chronic respiratory failure with hypoxia   Ventricular tachycardia, sustained   Protein-calorie malnutrition, severe   Wide-complex tachycardia   Hypokalemia   Hypomagnesemia    Time spent: 35 mins    [facility] MD [facility] Pager [phone]. If 7PM-7AM, please contact night-coverage at [email or web address], password [facility] 09/15/2013, 10:17 AM  LOS: 11 days

## 2013-09-16 LAB — BASIC METABOLIC PANEL
BUN: 14 mg/dL (ref 6–23)
CO2: 26 mEq/L (ref 19–32)
Calcium: 7 mg/dL — ABNORMAL LOW (ref 8.4–10.5)
Chloride: 106 mEq/L (ref 96–112)
Creatinine, Ser: 1.49 mg/dL — ABNORMAL HIGH (ref 0.50–1.10)
GFR, EST AFRICAN AMERICAN: 37 mL/min — AB (ref >90)
GFR, EST NON AFRICAN AMERICAN: 32 mL/min — AB (ref >90)
Glucose, Bld: 93 mg/dL (ref 70–99)
POTASSIUM: 3.7 meq/L (ref 3.7–5.3)
Sodium: 142 mEq/L (ref 137–147)

## 2013-09-16 LAB — CBC WITH DIFFERENTIAL/PLATELET
BASOS ABS: 0 10*3/uL (ref 0.0–0.1)
Basophils Relative: 0 % (ref 0–1)
Eosinophils Absolute: 0.5 10*3/uL (ref 0.0–0.7)
Eosinophils Relative: 3 % (ref 0–5)
HCT: 29.4 % — ABNORMAL LOW (ref 36.0–46.0)
Hemoglobin: 10 g/dL — ABNORMAL LOW (ref 12.0–15.0)
LYMPHS PCT: 14 % (ref 12–46)
Lymphs Abs: 2.4 10*3/uL (ref 0.7–4.0)
MCH: 29.8 pg (ref 26.0–34.0)
MCHC: 34 g/dL (ref 30.0–36.0)
MCV: 87.5 fL (ref 78.0–100.0)
Monocytes Absolute: 2 10*3/uL — ABNORMAL HIGH (ref 0.1–1.0)
Monocytes Relative: 12 % (ref 3–12)
Neutro Abs: 12.1 10*3/uL — ABNORMAL HIGH (ref 1.7–7.7)
Neutrophils Relative %: 71 % (ref 43–77)
Platelets: 373 10*3/uL (ref 150–400)
RBC: 3.36 MIL/uL — ABNORMAL LOW (ref 3.87–5.11)
RDW: 15.7 % — AB (ref 11.5–15.5)
WBC: 17 10*3/uL — ABNORMAL HIGH (ref 4.0–10.5)

## 2013-09-16 LAB — MAGNESIUM: Magnesium: 1.5 mg/dL (ref 1.5–2.5)

## 2013-09-16 MED ORDER — MAGNESIUM SULFATE 4000MG/100ML IJ SOLN
4.0000 g | Freq: Once | INTRAMUSCULAR | Status: DC
  Filled 2013-09-16: qty 100

## 2013-09-16 MED ORDER — MAGNESIUM OXIDE 400 (241.3 MG) MG PO TABS
400.0000 mg | ORAL_TABLET | Freq: Two times a day (BID) | ORAL | Status: DC
  Administered 2013-09-16 – 2013-09-22 (×14): 400 mg via ORAL
  Filled 2013-09-16 (×16): qty 1

## 2013-09-16 NOTE — Progress Notes (Signed)
Occupational Therapy Treatment Patient Details Name: Renee Donovan MRN: [id number] DOB: [birth year] Today's Date: 09/16/2013    History of present illness Pt is an 80 year old female admitted 4/23 for nausea and vomiting with history of COPD, hypothyroidism, chronic alcoholism, tobacco abuse, history of breast cancer, hypertension.  Pt also with toe ulcers suspicious for PVD per chart review.  Pt usually on 2L O2 at home and currently on 2L O2 in room.   OT comments  2 goals added  Follow Up Recommendations  SNF    Equipment Recommendations  None recommended by OT       Precautions / Restrictions Precautions Precautions: Fall Restrictions Weight Bearing Restrictions: No       Mobility Bed Mobility            pt in chair      Transfers          did notperform            Balance                                   ADL                                         General ADL Comments: Pt sitting in chair/ Pt did agree to BUE ROM/ exercise to help with edema in arms. Also propped BUE arms on pillows to A with edema.  Pts IV appears to be leaking. RN notified. 2 goals added for elevation and exericse BUE         Perception     Praxis          Extremity/Trunk Assessment               Exercises General Exercises - Upper Extremity Shoulder Flexion: AAROM;Both;10 reps Elbow Flexion: AROM;Both;5 reps Wrist Flexion: AROM;Both;5 reps Wrist Extension: AROM;Both;5 reps Digit Composite Flexion: AROM;Both;10 reps Composite Extension: AROM;Both;10 reps           Progress Toward Goals  OT Goals(current goals can now be found in the care plan section)  Progress towards OT goals: Progressing toward goals  ADL Goals Additional ADL Goal #1: Pt will keep BUe elevated on pillows to decrease edema with min VC Additional ADL Goal #2: Pt will perform BUE HEP including shoulder, elbow, wrist and hands to decrease edema in  BUE with S and min VC  Plan Discharge plan remains appropriate          Activity Tolerance Patient tolerated treatment well   Patient Left in chair;with call bell/phone within reach   Nurse Communication Other (comment) (leaking IV)        Time: 1025-1039 OT Time Calculation (min): 14 min  Charges: OT General Charges $OT Visit: 1 Procedure OT Treatments $Therapeutic Activity: 8-22 mins  REDDING, Lorraine D 09/16/2013, 10:44 AM

## 2013-09-16 NOTE — Progress Notes (Signed)
[facility] PROGRESS NOTE  Renee Donovan OAC:[id number] DOB: Dec 06, 1930 DOA: 09/04/2013 PCP: [name], MD  Assessment/Plan: #1 acute appendicitis Patient with some clinical improvement. Per general surgery repeat CT scan showing improvement of appendicitis. Colitis noted on CT scan. WBC is trending down. Afebrile. Continue oral ciprofloxacin and Flagyl. Patient is tolerating pured diet. General surgeon following and appreciate input and recommendations.  #2 C. difficile colitis Patient noted to have multiple loose stools yesterday. WBC trending down. Abdominal pain improved. Continue vancomycin enema day #9/14. Continue oral Flagyl day #11/14. Will add fluorastor.  #3 chronic diastolic CHF Stable. Grade 2 diastolic dysfunction noted on 2-D echo of 09/05/2013 with a preserved EF. Continue beta blocker, imdur, hydralazine. Per cardiology.  #4 wide complex tachycardia Episode noted on the night of 09/08/2013. Patient has been started on amiodarone per cardiology. Amiodarone dose has been changed to 200 mg daily for the next 8 weeks and then DC per cardiology. Continue beta blocker. Outpatient followup with Dr. Percival Spanish of cardiology as outpatient.  #5 COPD with emphysema Stable. Continue Spiriva.  #6 acute on chronic kidney disease stage IV Baseline creatinine approximately 1.8. On admission was 3.05 in the setting of acute perforated appendicitis and also in the setting of ACE inhibitor. ACE inhibitor has been held. Renal function stable and currently at 1.49. Stable. Monitor fluid status.  #7 hypokalemia/hypomagnesemia Likely secondary to GI losses from C. difficile colitis. Repleted.  #8 anemia Likely dilutional in nature. H&H stable. Follow.  #9 hypertension Continue Lopressor, imdur, hydralazine, clonidine patch.  #10 adnexal cystic lesions Will need outpatient abdominal ultrasound followup.  #11 leukocytosis Questionable etiology. WBC slowly trending down.  Likely secondary to C. difficile colitis. Chest x-ray shows new left lung base opacity most likely combination of small effusion with either infiltrate or atelectasis. Patient has empirically been on IV Zosyn and has no respiratory symptoms and unlikely to be a pneumonia. Urine cultures negative. Continue vancomycin enema. Continue oral Flagyl, oral ciprofloxacin. Follow.  #12 prophylaxis SCDs for DVT prophylaxis.  Code Status: DO NOT RESUSCITATE Family Communication: Updated patient no family at bedside. Disposition Plan:SNF when medically stable.   Consultants:  Cardiology: Dr. Elias Else 09/05/2013   General surgery: Dr. Excell Seltzer 09/05/2013  Procedures: Ct Abdomen Pelvis Wo Contras 09/05/2013 IMPRESSION: Probable acute versus possibly early perforated appendicitis as evidenced by 2.9 x 2.3 cm inflammatory mass adjacent to the cecum. No abscess or pneumoperitoneum. Low-grade partial small bowel obstruction or, ileus from adjacent appendiceal process. Bilaterals cystic adnexal masses, smaller on the left which would be atypical for neoplasm. Recommend follow-up ultrasound for further characterization. Small right pleural effusion with right lower lobe airspace opacity which may reflect atelectasis, less likely early pneumonia. Severe aortic atherosclerosis was similar multi segmental ectasia. Dg Chest Portable 1 View 09/04/2013 IMPRESSION: No acute abnormality. Probable emphysema  CT abdomen and pelvis without contrast 09/10/2013 IMPRESSION:  Enlarged thickened appendix with mild periappendiceal infiltrative  changes most likely representing a acute appendicitis.  No definite evidence of perforation or abscess.  Bowel wall thickening extending from the rectum to the distal  transverse colon raising question of colitis new since previous  exam.  Cholelithiasis.  Small abdominal aortic aneurysms.  BILATERAL ovarian cysts up to 4.8 cm in size ; recommend followup  nonemergent sonographic  characterization.  Chest x-ray 09/04/2013 2-D echo 09/05/2013   Antibiotics: IV Zosyn 09/04/2013 -->09/15/13  IV Flagyl 09/05/2013 --> 09/13/13 Vancomycin PO 6/14 -->09/13/13 Vancomycin enema 09/13/13 Oral Flagyl 09/13/13 Oral cipro 09/15/13   HPI/Subjective: Patient  states abdominal pain is improved. No nausea, no vomiting. Patient with multiple loose stools.   Objective: Filed Vitals:   09/16/13 0542  BP: 160/62  Pulse: 61  Temp: 97.5 F (36.4 C)  Resp: 16    Intake/Output Summary (Last 24 hours) at 09/16/13 0949 Last data filed at 09/16/13 0900  Gross per 24 hour  Intake   1740 ml  Output      0 ml  Net   1740 ml   Filed Weights   09/13/13 0630 09/14/13 0500 09/16/13 0542  Weight: 78 kg (171 lb 15.3 oz) 77.9 kg (171 lb 11.8 oz) 75.8 kg (167 lb 1.7 oz)    Exam:   General:  NAD  Cardiovascular: RRR  Respiratory: CTAB  Abdomen: Soft/ND/ NTTP/+BS  Musculoskeletal: No clubbing cyanosis or edema.   Data Reviewed: Basic Metabolic Panel:  Recent Labs Lab 09/11/13 0520 09/12/13 0323 09/13/13 0335 09/14/13 0326 09/15/13 0340 09/16/13 0650  NA  --  141 140 143 140 142  K  --  4.1 3.6* 4.7 3.9 3.7  CL  --  111 112 115* 108 106  CO2  --  17* 18* 14* 19 26  GLUCOSE  --  101* 103* 75 99 93  BUN  --  15 14 13 14 14   CREATININE  --  1.59* 1.56* 1.57* 1.60* 1.49*  CALCIUM  --  7.7* 7.4* 7.6* 7.2* 7.0*  MG 1.4* 2.1  --   --   --  1.5   Liver Function Tests:  Recent Labs Lab 09/11/13 0320  AST 12  ALT 5  ALKPHOS 66  BILITOT 0.4  PROT 4.4*  ALBUMIN 1.4*   No results found for this basename: LIPASE, AMYLASE,  in the last 168 hours No results found for this basename: AMMONIA,  in the last 168 hours CBC:  Recent Labs Lab 09/12/13 0323 09/13/13 0335 09/14/13 0326 09/15/13 0340 09/16/13 0650  WBC 18.5* 21.1* 22.5* 21.8* 17.0*  NEUTROABS 13.7* 16.1* 17.6* 17.0* 12.1*  HGB 11.1* 11.1* 10.4* 10.2* 10.0*  HCT 34.0* 34.2* 31.6* 29.2* 29.4*  MCV 93.4  90.0 90.0 87.7 87.5  PLT 354 344 335 378 373   Cardiac Enzymes: No results found for this basename: CKTOTAL, CKMB, CKMBINDEX, TROPONINI,  in the last 168 hours BNP (last 3 results)  Recent Labs  07/25/13 0308  PROBNP 4353.0*   CBG:  Recent Labs Lab 09/10/13 1341 09/10/13 1421  GLUCAP 66* 159*    Recent Results (from the past 240 hour(s))  CULTURE, BLOOD (ROUTINE X 2)     Status: None   Collection Time    09/13/13  7:25 AM      Result Value Ref Range Status   Specimen Description BLOOD LEFT ARM   Final   Special Requests BOTTLES DRAWN AEROBIC ONLY 1 CC   Final   Culture  Setup Time     Final   Value: 09/13/2013 12:01     Performed at [facility]   Culture     Final   Value:        BLOOD CULTURE RECEIVED NO GROWTH TO DATE CULTURE WILL BE HELD FOR 5 DAYS BEFORE ISSUING A FINAL NEGATIVE REPORT     Performed at [facility]   Report Status PENDING   Incomplete  CULTURE, BLOOD (ROUTINE X 2)     Status: None   Collection Time    09/13/13  8:29 AM      Result Value Ref Range Status  Specimen Description BLOOD LEFT AC   Final   Special Requests BOTTLES DRAWN AEROBIC AND ANAEROBIC 6 CC   Final   Culture  Setup Time     Final   Value: 09/13/2013 12:01     Performed at [facility]   Culture     Final   Value:        BLOOD CULTURE RECEIVED NO GROWTH TO DATE CULTURE WILL BE HELD FOR 5 DAYS BEFORE ISSUING A FINAL NEGATIVE REPORT     Performed at [facility]   Report Status PENDING   Incomplete  URINE CULTURE     Status: None   Collection Time    09/13/13 10:20 AM      Result Value Ref Range Status   Specimen Description URINE, CATHETERIZED   Final   Special Requests NONE   Final   Culture  Setup Time     Final   Value: 09/13/2013 12:30     Performed at [facility] Count     Final   Value: NO GROWTH     Performed at [facility]   Culture     Final   Value: NO GROWTH     Performed at [facility]    Report Status 09/14/2013 FINAL   Final     Studies: No results found.  Scheduled Meds: . albuterol  2.5 mg Nebulization BID  . amiodarone  200 mg Oral Daily  . antiseptic oral rinse  15 mL Mouth Rinse q12n4p  . chlorhexidine  15 mL Mouth Rinse BID  . ciprofloxacin  500 mg Oral BID  . cloNIDine  0.2 mg Transdermal Weekly  . feeding supplement (ENSURE COMPLETE)  237 mL Oral TID WC  . hydrALAZINE  25 mg Oral 3 times per day  . isosorbide mononitrate  120 mg Oral Daily  . metoprolol tartrate  25 mg Oral BID  . metroNIDAZOLE  500 mg Oral 3 times per day  . saccharomyces boulardii  250 mg Oral BID  . tiotropium  18 mcg Inhalation Daily  . vancomycin (VANCOCIN) rectal ENEMA  500 mg Rectal 4 times per day   Continuous Infusions: .  sodium bicarbonate  infusion 1000 mL 100 mL/hr at 09/16/13 0410    Principal Problem:   Acute perforated appendicitis Active Problems:   Clostridium difficile colitis   COPD (chronic obstructive pulmonary disease) with emphysema   Nausea and vomiting   ARF (acute renal failure)   Diastolic CHF, chronic   Chronic respiratory failure with hypoxia   Ventricular tachycardia, sustained   Protein-calorie malnutrition, severe   Wide-complex tachycardia   Hypokalemia   Hypomagnesemia    Time spent: 35 mins    [facility] MD [facility] Pager [phone]. If 7PM-7AM, please contact night-coverage at [email or web address], password [facility] 09/16/2013, 9:49 AM  LOS: 12 days

## 2013-09-16 NOTE — Progress Notes (Signed)
General surgery attending:  I have interviewed and examined this patient this morning. I agree with the assessment and treatment plan outlined by Ms. Maxwell Caul, [place] and by Dr. Irine Seal.  Her abdomen is soft and benign. Diarrhea persists. Hopefully her colitis will subside allowing discharge home. We can consider interval appendectomy in the future, depending on her physical status.  Edsel Petrin. Dalbert Batman, M.D., [facility] General and Minimally invasive Surgery Breast and Colorectal Surgery Office:   [phone]

## 2013-09-16 NOTE — Progress Notes (Signed)
CSW following for return to [facility] when medically ready. CSW has completed FL2 & will continue to follow and assist with return.    Raynaldo Opitz, [facility] Clinical Social Worker cell #: [phone]

## 2013-09-16 NOTE — Progress Notes (Signed)
Patient ID: Renee Donovan, female   DOB: [birth year], 80 y.o.   MRN: [id number]    Subjective: Pt feels well this morning.  No abdominal pain.  Tolerating pureed diet.  No further nausea.  Diarrhea improved  Objective: Vital signs in last 24 hours: Temp:  [97.4 F (36.3 C)-98.1 F (36.7 C)] 97.5 F (36.4 C) (06/22 0542) Pulse Rate:  [61-66] 61 (06/22 0542) Resp:  [16-20] 16 (06/22 0542) BP: (113-160)/(40-62) 160/62 mmHg (06/22 0542) SpO2:  [94 %-100 %] 94 % (06/22 0844) Weight:  [167 lb 1.7 oz (75.8 kg)] 167 lb 1.7 oz (75.8 kg) (06/22 0542) Last BM Date: 09/12/13  Intake/Output from previous day: 06/21 0701 - 06/22 0700 In: 1620 [P.O.:420; I.V.:1200] Out: -  Intake/Output this shift:    PE: Abd: soft, NT, ND, +BS  Lab Results:   Recent Labs  09/15/13 0340 09/16/13 0650  WBC 21.8* 17.0*  HGB 10.2* 10.0*  HCT 29.2* 29.4*  PLT 378 373   BMET  Recent Labs  09/15/13 0340 09/16/13 0650  NA 140 142  K 3.9 3.7  CL 108 106  CO2 19 26  GLUCOSE 99 93  BUN 14 14  CREATININE 1.60* 1.49*  CALCIUM 7.2* 7.0*   PT/INR No results found for this basename: LABPROT, INR,  in the last 72 hours CMP     Component Value Date/Time   NA 142 09/16/2013 0650   K 3.7 09/16/2013 0650   CL 106 09/16/2013 0650   CO2 26 09/16/2013 0650   GLUCOSE 93 09/16/2013 0650   BUN 14 09/16/2013 0650   CREATININE 1.49* 09/16/2013 0650   CALCIUM 7.0* 09/16/2013 0650   PROT 4.4* 09/11/2013 0320   ALBUMIN 1.4* 09/11/2013 0320   AST 12 09/11/2013 0320   ALT 5 09/11/2013 0320   ALKPHOS 66 09/11/2013 0320   BILITOT 0.4 09/11/2013 0320   GFRNONAA 32* 09/16/2013 0650   GFRAA 37* 09/16/2013 0650   Lipase     Component Value Date/Time   LIPASE 16 09/04/2013 1953       Studies/Results: No results found.  Anti-infectives: Anti-infectives   Start     Dose/Rate Route Frequency Ordered Stop   09/15/13 2000  ciprofloxacin (CIPRO) tablet 500 mg     500 mg Oral 2 times daily 09/15/13 1356     09/13/13  1400  metroNIDAZOLE (FLAGYL) tablet 500 mg     500 mg Oral 3 times per day 09/13/13 0931     09/13/13 1200  vancomycin (VANCOCIN) 500 mg in sodium chloride irrigation 0.9 % 100 mL ENEMA     500 mg Rectal 4 times per day 09/13/13 0842     09/08/13 1400  piperacillin-tazobactam (ZOSYN) IVPB 3.375 g  Status:  Discontinued     3.375 g 12.5 mL/hr over 240 Minutes Intravenous Every 8 hours 09/08/13 0915 09/15/13 1356   09/08/13 1200  vancomycin (VANCOCIN) 50 mg/mL oral solution 250 mg  Status:  Discontinued     250 mg Oral 4 times per day 09/08/13 1016 09/13/13 0842   09/05/13 1000  metroNIDAZOLE (FLAGYL) IVPB 500 mg  Status:  Discontinued     500 mg 100 mL/hr over 60 Minutes Intravenous 3 times per day 09/05/13 0834 09/13/13 0930   09/05/13 0930  metroNIDAZOLE (FLAGYL) IVPB 500 mg  Status:  Discontinued     500 mg 100 mL/hr over 60 Minutes Intravenous Every 8 hours 09/05/13 0927 09/05/13 0939   09/05/13 0800  piperacillin-tazobactam (ZOSYN) IVPB 2.25 g  Status:  Discontinued     2.25 g 100 mL/hr over 30 Minutes Intravenous Every 6 hours 09/05/13 0213 09/08/13 0915   09/05/13 0030  piperacillin-tazobactam (ZOSYN) IVPB 3.375 g     3.375 g 100 mL/hr over 30 Minutes Intravenous  Once 09/05/13 0028 09/05/13 0128       Assessment/Plan  1. Acute perforated appendicitis 2. c diff colitis  Plan: 1. Leukocytosis improving today 2. Cont pureed diet 3. Cont current abx regimen and hopefully she will continue to make progress.   LOS: 12 days    OSBORNE,KELLY E 09/16/2013, 9:02 AM Pager: [phone]

## 2013-09-17 DIAGNOSIS — E43 Unspecified severe protein-calorie malnutrition: Secondary | ICD-10-CM

## 2013-09-17 DIAGNOSIS — N83209 Unspecified ovarian cyst, unspecified side: Secondary | ICD-10-CM

## 2013-09-17 DIAGNOSIS — L89609 Pressure ulcer of unspecified heel, unspecified stage: Secondary | ICD-10-CM

## 2013-09-17 DIAGNOSIS — L8990 Pressure ulcer of unspecified site, unspecified stage: Secondary | ICD-10-CM

## 2013-09-17 DIAGNOSIS — K56609 Unspecified intestinal obstruction, unspecified as to partial versus complete obstruction: Secondary | ICD-10-CM

## 2013-09-17 LAB — CBC WITH DIFFERENTIAL/PLATELET
Basophils Absolute: 0.1 10*3/uL (ref 0.0–0.1)
Basophils Relative: 0 % (ref 0–1)
EOS PCT: 2 % (ref 0–5)
Eosinophils Absolute: 0.3 10*3/uL (ref 0.0–0.7)
HCT: 29.1 % — ABNORMAL LOW (ref 36.0–46.0)
Hemoglobin: 9.7 g/dL — ABNORMAL LOW (ref 12.0–15.0)
Lymphocytes Relative: 13 % (ref 12–46)
Lymphs Abs: 2.2 10*3/uL (ref 0.7–4.0)
MCH: 29.8 pg (ref 26.0–34.0)
MCHC: 33.3 g/dL (ref 30.0–36.0)
MCV: 89.3 fL (ref 78.0–100.0)
MONO ABS: 1.7 10*3/uL — AB (ref 0.1–1.0)
MONOS PCT: 10 % (ref 3–12)
NEUTROS PCT: 75 % (ref 43–77)
Neutro Abs: 12.7 10*3/uL — ABNORMAL HIGH (ref 1.7–7.7)
PLATELETS: 406 10*3/uL — AB (ref 150–400)
RBC: 3.26 MIL/uL — AB (ref 3.87–5.11)
RDW: 16 % — AB (ref 11.5–15.5)
WBC: 17 10*3/uL — ABNORMAL HIGH (ref 4.0–10.5)

## 2013-09-17 LAB — BASIC METABOLIC PANEL
BUN: 14 mg/dL (ref 6–23)
CALCIUM: 7.4 mg/dL — AB (ref 8.4–10.5)
CO2: 27 meq/L (ref 19–32)
CREATININE: 1.5 mg/dL — AB (ref 0.50–1.10)
Chloride: 105 mEq/L (ref 96–112)
GFR calc Af Amer: 36 mL/min — ABNORMAL LOW (ref >90)
GFR calc non Af Amer: 31 mL/min — ABNORMAL LOW (ref >90)
Glucose, Bld: 76 mg/dL (ref 70–99)
Potassium: 3.8 mEq/L (ref 3.7–5.3)
Sodium: 141 mEq/L (ref 137–147)

## 2013-09-17 LAB — MAGNESIUM: Magnesium: 1.5 mg/dL (ref 1.5–2.5)

## 2013-09-17 MED ORDER — VANCOMYCIN 50 MG/ML ORAL SOLUTION
125.0000 mg | Freq: Four times a day (QID) | ORAL | Status: DC
  Administered 2013-09-17 – 2013-09-23 (×25): 125 mg via ORAL
  Filled 2013-09-17 (×29): qty 2.5

## 2013-09-17 MED ORDER — FLUCONAZOLE 100 MG PO TABS
100.0000 mg | ORAL_TABLET | Freq: Every day | ORAL | Status: DC
Start: 2013-09-18 — End: 2013-09-17

## 2013-09-17 MED ORDER — BOOST / RESOURCE BREEZE PO LIQD
1.0000 | Freq: Three times a day (TID) | ORAL | Status: DC
  Administered 2013-09-18 – 2013-09-22 (×9): 1 via ORAL

## 2013-09-17 MED ORDER — NYSTATIN 100000 UNIT/GM EX POWD
Freq: Three times a day (TID) | CUTANEOUS | Status: DC
  Administered 2013-09-17 – 2013-09-23 (×17): via TOPICAL
  Filled 2013-09-17 (×2): qty 15

## 2013-09-17 MED ORDER — MAGNESIUM SULFATE 50 % IJ SOLN
3.0000 g | Freq: Once | INTRAVENOUS | Status: DC
  Filled 2013-09-17: qty 6

## 2013-09-17 MED ORDER — FLUCONAZOLE 200 MG PO TABS: 200.0000 mg | ORAL_TABLET | Freq: Once | ORAL | Status: DC

## 2013-09-17 NOTE — Progress Notes (Signed)
IV team attempted to place peripheral IV site - unsuccessful.  Limited by restricted RUE, edema, ecchymosis, prior IV sites.  IV team suggested PICC line or central line if appropriate.  Dr. Grandville Silos notified by text page.  Coolidge Breeze, RN 09/17/2013

## 2013-09-17 NOTE — Progress Notes (Signed)
Patient ID: Renee Donovan, female   DOB: November 27, 1930, 80 y.o.   MRN: [id number]  Subjective: Afebrile.  WBC same at 17K today.  Tolerating a diet.  Not mobilzing much, PT/OT following.  Diarrhea appears to be improving, still has FMS.    Objective:  Vital signs:  Filed Vitals:   09/16/13 1404 09/16/13 2048 09/16/13 2147 09/17/13 0627  BP: 113/43  143/53 149/53  Pulse: 77  66 59  Temp: 97.5 F (36.4 C)  98.2 F (36.8 C) 98.2 F (36.8 C)  TempSrc: Oral  Oral Oral  Resp: 20  20 20   Height:      Weight:      SpO2: 95% 99% 93% 94%    Last BM Date: 09/16/13  Intake/Output   Yesterday:  06/22 0701 - 06/23 0700 In: 545 [P.O.:120; I.V.:425] Out: -  This shift:    I/O last 3 completed shifts: In: 2045 [P.O.:420; I.V.:1625] Out: -     Physical Exam: General: Pt awake/alert/oriented x4 in no acute distress Chest: cta.  No chest wall pain w good excursion CV:  Pulses intact.  Regular rhythm Abdomen: Soft.  Nondistended.  ttp luq.  No evidence of peritonitis.  No incarcerated hernias.   Problem List:   Principal Problem:   Acute perforated appendicitis Active Problems:   COPD (chronic obstructive pulmonary disease) with emphysema   Nausea and vomiting   ARF (acute renal failure)   Diastolic CHF, chronic   Chronic respiratory failure with hypoxia   Ventricular tachycardia, sustained   Protein-calorie malnutrition, severe   Wide-complex tachycardia   Hypokalemia   Hypomagnesemia   Clostridium difficile colitis    Results:   Labs: Results for orders placed during the hospital encounter of 09/04/13 (from the past 48 hour(s))  CBC WITH DIFFERENTIAL     Status: Abnormal   Collection Time    09/16/13  6:50 AM      Result Value Ref Range   WBC 17.0 (*) 4.0 - 10.5 K/uL   RBC 3.36 (*) 3.87 - 5.11 MIL/uL   Hemoglobin 10.0 (*) 12.0 - 15.0 g/dL   HCT 29.4 (*) 36.0 - 46.0 %   MCV 87.5  78.0 - 100.0 fL   MCH 29.8  26.0 - 34.0 pg   MCHC 34.0  30.0 - 36.0 g/dL   RDW  15.7 (*) 11.5 - 15.5 %   Platelets 373  150 - 400 K/uL   Neutrophils Relative % 71  43 - 77 %   Lymphocytes Relative 14  12 - 46 %   Monocytes Relative 12  3 - 12 %   Eosinophils Relative 3  0 - 5 %   Basophils Relative 0  0 - 1 %   Neutro Abs 12.1 (*) 1.7 - 7.7 K/uL   Lymphs Abs 2.4  0.7 - 4.0 K/uL   Monocytes Absolute 2.0 (*) 0.1 - 1.0 K/uL   Eosinophils Absolute 0.5  0.0 - 0.7 K/uL   Basophils Absolute 0.0  0.0 - 0.1 K/uL   WBC Morphology MILD LEFT SHIFT (1-5% METAS, OCC MYELO, OCC BANDS)    BASIC METABOLIC PANEL     Status: Abnormal   Collection Time    09/16/13  6:50 AM      Result Value Ref Range   Sodium 142  137 - 147 mEq/L   Potassium 3.7  3.7 - 5.3 mEq/L   Chloride 106  96 - 112 mEq/L   CO2 26  19 - 32 mEq/L   Glucose,  Bld 93  70 - 99 mg/dL   BUN 14  6 - 23 mg/dL   Creatinine, Ser 1.49 (*) 0.50 - 1.10 mg/dL   Calcium 7.0 (*) 8.4 - 10.5 mg/dL   GFR calc non Af Amer 32 (*) >90 mL/min   GFR calc Af Amer 37 (*) >90 mL/min   Comment: (NOTE)     The eGFR has been calculated using the CKD EPI equation.     This calculation has not been validated in all clinical situations.     eGFR's persistently <90 mL/min signify possible Chronic Kidney     Disease.  MAGNESIUM     Status: None   Collection Time    09/16/13  6:50 AM      Result Value Ref Range   Magnesium 1.5  1.5 - 2.5 mg/dL  CBC WITH DIFFERENTIAL     Status: Abnormal   Collection Time    09/17/13  3:25 AM      Result Value Ref Range   WBC 17.0 (*) 4.0 - 10.5 K/uL   RBC 3.26 (*) 3.87 - 5.11 MIL/uL   Hemoglobin 9.7 (*) 12.0 - 15.0 g/dL   HCT 29.1 (*) 36.0 - 46.0 %   MCV 89.3  78.0 - 100.0 fL   MCH 29.8  26.0 - 34.0 pg   MCHC 33.3  30.0 - 36.0 g/dL   RDW 16.0 (*) 11.5 - 15.5 %   Platelets 406 (*) 150 - 400 K/uL   Neutrophils Relative % 75  43 - 77 %   Neutro Abs 12.7 (*) 1.7 - 7.7 K/uL   Lymphocytes Relative 13  12 - 46 %   Lymphs Abs 2.2  0.7 - 4.0 K/uL   Monocytes Relative 10  3 - 12 %   Monocytes Absolute  1.7 (*) 0.1 - 1.0 K/uL   Eosinophils Relative 2  0 - 5 %   Eosinophils Absolute 0.3  0.0 - 0.7 K/uL   Basophils Relative 0  0 - 1 %   Basophils Absolute 0.1  0.0 - 0.1 K/uL  BASIC METABOLIC PANEL     Status: Abnormal   Collection Time    09/17/13  3:25 AM      Result Value Ref Range   Sodium 141  137 - 147 mEq/L   Potassium 3.8  3.7 - 5.3 mEq/L   Chloride 105  96 - 112 mEq/L   CO2 27  19 - 32 mEq/L   Glucose, Bld 76  70 - 99 mg/dL   BUN 14  6 - 23 mg/dL   Creatinine, Ser 1.50 (*) 0.50 - 1.10 mg/dL   Calcium 7.4 (*) 8.4 - 10.5 mg/dL   GFR calc non Af Amer 31 (*) >90 mL/min   GFR calc Af Amer 36 (*) >90 mL/min   Comment: (NOTE)     The eGFR has been calculated using the CKD EPI equation.     This calculation has not been validated in all clinical situations.     eGFR's persistently <90 mL/min signify possible Chronic Kidney     Disease.  MAGNESIUM     Status: None   Collection Time    09/17/13  3:25 AM      Result Value Ref Range   Magnesium 1.5  1.5 - 2.5 mg/dL    Imaging / Studies: No results found.  Scheduled Meds: . albuterol  2.5 mg Nebulization BID  . amiodarone  200 mg Oral Daily  . antiseptic oral rinse  15  mL Mouth Rinse q12n4p  . chlorhexidine  15 mL Mouth Rinse BID  . ciprofloxacin  500 mg Oral BID  . cloNIDine  0.2 mg Transdermal Weekly  . feeding supplement (ENSURE COMPLETE)  237 mL Oral TID WC  . hydrALAZINE  25 mg Oral 3 times per day  . isosorbide mononitrate  120 mg Oral Daily  . magnesium oxide  400 mg Oral BID  . metoprolol tartrate  25 mg Oral BID  . metroNIDAZOLE  500 mg Oral 3 times per day  . saccharomyces boulardii  250 mg Oral BID  . tiotropium  18 mcg Inhalation Daily  . vancomycin (VANCOCIN) rectal ENEMA  500 mg Rectal 4 times per day   Continuous Infusions: .  sodium bicarbonate  infusion 1000 mL Stopped (09/16/13 1115)   PRN Meds:.hydrALAZINE, HYDROmorphone (DILAUDID) injection, lactated ringers, levalbuterol, ondansetron (ZOFRAN) IV,  ondansetron   Antibiotics: Anti-infectives   Start     Dose/Rate Route Frequency Ordered Stop   09/15/13 2000  ciprofloxacin (CIPRO) tablet 500 mg     500 mg Oral 2 times daily 09/15/13 1356     09/13/13 1400  metroNIDAZOLE (FLAGYL) tablet 500 mg     500 mg Oral 3 times per day 09/13/13 0931     09/13/13 1200  vancomycin (VANCOCIN) 500 mg in sodium chloride irrigation 0.9 % 100 mL ENEMA     500 mg Rectal 4 times per day 09/13/13 0842     09/08/13 1400  piperacillin-tazobactam (ZOSYN) IVPB 3.375 g  Status:  Discontinued     3.375 g 12.5 mL/hr over 240 Minutes Intravenous Every 8 hours 09/08/13 0915 09/15/13 1356   09/08/13 1200  vancomycin (VANCOCIN) 50 mg/mL oral solution 250 mg  Status:  Discontinued     250 mg Oral 4 times per day 09/08/13 1016 09/13/13 0842   09/05/13 1000  metroNIDAZOLE (FLAGYL) IVPB 500 mg  Status:  Discontinued     500 mg 100 mL/hr over 60 Minutes Intravenous 3 times per day 09/05/13 0834 09/13/13 0930   09/05/13 0930  metroNIDAZOLE (FLAGYL) IVPB 500 mg  Status:  Discontinued     500 mg 100 mL/hr over 60 Minutes Intravenous Every 8 hours 09/05/13 0927 09/05/13 0939   09/05/13 0800  piperacillin-tazobactam (ZOSYN) IVPB 2.25 g  Status:  Discontinued     2.25 g 100 mL/hr over 30 Minutes Intravenous Every 6 hours 09/05/13 0213 09/08/13 0915   09/05/13 0030  piperacillin-tazobactam (ZOSYN) IVPB 3.375 g     3.375 g 100 mL/hr over 30 Minutes Intravenous  Once 09/05/13 0028 09/05/13 0128       Assessment/Plan  Acute perforated appendicitis  c diff colitis -consider ID consultation for input on antibiotics -we will consider an interval appendectomy in the future, depending on her physical status -continue with pureed diet -trend white count -continue with cipro, flagyl, vanc -hopefully home once c diff improves  Erby Pian, [facility] Surgery Pager [phone] Office [phone]  09/17/2013 7:52 AM

## 2013-09-17 NOTE — Progress Notes (Signed)
Patient interviewed and examined. Agree with above.  Seems to be improving. Enjoying her meal. Denies pain, but subjectively tender on deep palpation. Recommend ID consult re: antibiotic selection and duration.  Edsel Petrin. Dalbert Batman, M.D., [facility] General and Minimally invasive Surgery Breast and Colorectal Surgery Office:   [phone]

## 2013-09-17 NOTE — Progress Notes (Signed)
[facility] PROGRESS NOTE  Renee Donovan ZOX:[id number] DOB: 01/24/1931 DOA: 09/04/2013 PCP: [name], MD  Assessment/Plan: #1 acute appendicitis Patient with some clinical improvement. Per general surgery repeat CT scan showing improvement of appendicitis. Colitis noted on CT scan. WBC is trending down. Afebrile. Continue oral ciprofloxacin and Flagyl. Patient is tolerating pured diet. General surgeon following and appreciate input and recommendations.  #2 C. difficile colitis Patient noted to have multiple loose stools. WBC trending down. Abdominal pain improved. Continue vancomycin enema day #10/14. Continue oral Flagyl day #12/14. Will add fluorastor. Due to continued multiple loose stools and leukocytosis we'll consult with ID for further recommendations on antibiotic management.  #3 chronic diastolic CHF Stable. Grade 2 diastolic dysfunction noted on 2-D echo of 09/05/2013 with a preserved EF. Continue beta blocker, imdur, hydralazine. Per cardiology.  #4 wide complex tachycardia Episode noted on the night of 09/08/2013. Patient has been started on amiodarone per cardiology. Amiodarone dose has been changed to 200 mg daily for the next 8 weeks and then DC per cardiology. Continue beta blocker. Outpatient followup with Dr. Percival Spanish of cardiology as outpatient.  #5 COPD with emphysema Stable. Continue Spiriva.  #6 acute on chronic kidney disease stage IV Baseline creatinine approximately 1.8. On admission was 3.05 in the setting of acute perforated appendicitis and also in the setting of ACE inhibitor. ACE inhibitor has been held. Renal function stable and currently at 1.50. Stable. Monitor fluid status.  #7 hypokalemia/hypomagnesemia Likely secondary to GI losses from C. difficile colitis. Repleted.  #8 anemia Likely dilutional in nature. H&H stable. Follow.  #9 hypertension Continue Lopressor, imdur, hydralazine, clonidine patch.  #10 adnexal cystic  lesions Will need outpatient abdominal ultrasound followup.  #11 leukocytosis Questionable etiology. WBC slowly trending down. Likely secondary to C. difficile colitis. Chest x-ray shows new left lung base opacity most likely combination of small effusion with either infiltrate or atelectasis. Patient has empirically been on IV Zosyn and has no respiratory symptoms and unlikely to be a pneumonia. Urine cultures negative. Continue vancomycin enema, oral Flagyl, oral ciprofloxacin. Follow.  #12 prophylaxis SCDs for DVT prophylaxis.  Code Status: DO NOT RESUSCITATE Family Communication: Updated patient no family at bedside. Disposition Plan:SNF when medically stable.   Consultants:  Cardiology: Dr. Elias Else 09/05/2013   General surgery: Dr. Excell Seltzer 09/05/2013  Procedures: Ct Abdomen Pelvis Wo Contras 09/05/2013 IMPRESSION: Probable acute versus possibly early perforated appendicitis as evidenced by 2.9 x 2.3 cm inflammatory mass adjacent to the cecum. No abscess or pneumoperitoneum. Low-grade partial small bowel obstruction or, ileus from adjacent appendiceal process. Bilaterals cystic adnexal masses, smaller on the left which would be atypical for neoplasm. Recommend follow-up ultrasound for further characterization. Small right pleural effusion with right lower lobe airspace opacity which may reflect atelectasis, less likely early pneumonia. Severe aortic atherosclerosis was similar multi segmental ectasia. Dg Chest Portable 1 View 09/04/2013 IMPRESSION: No acute abnormality. Probable emphysema  CT abdomen and pelvis without contrast 09/10/2013 IMPRESSION:  Enlarged thickened appendix with mild periappendiceal infiltrative  changes most likely representing a acute appendicitis.  No definite evidence of perforation or abscess.  Bowel wall thickening extending from the rectum to the distal  transverse colon raising question of colitis new since previous  exam.  Cholelithiasis.  Small  abdominal aortic aneurysms.  BILATERAL ovarian cysts up to 4.8 cm in size ; recommend followup  nonemergent sonographic characterization.  Chest x-ray 09/04/2013 2-D echo 09/05/2013   Antibiotics: IV Zosyn 09/04/2013 -->09/15/13  IV Flagyl 09/05/2013 --> 09/13/13 Vancomycin  PO 6/14 -->09/13/13 Vancomycin enema 09/13/13 Oral Flagyl 09/13/13 Oral cipro 09/15/13   HPI/Subjective: Patient states abdominal pain is improved. No nausea, no vomiting. Patient with multiple loose stools.   Objective: Filed Vitals:   09/17/13 0627  BP: 149/53  Pulse: 59  Temp: 98.2 F (36.8 C)  Resp: 20    Intake/Output Summary (Last 24 hours) at 09/17/13 0924 Last data filed at 09/17/13 0730  Gross per 24 hour  Intake   1225 ml  Output      0 ml  Net   1225 ml   Filed Weights   09/13/13 0630 09/14/13 0500 09/16/13 0542  Weight: 78 kg (171 lb 15.3 oz) 77.9 kg (171 lb 11.8 oz) 75.8 kg (167 lb 1.7 oz)    Exam:   General:  NAD  Cardiovascular: RRR  Respiratory: CTAB  Abdomen: Soft/ND/ min-mild TTP/+BS  Musculoskeletal: No clubbing cyanosis or edema.   Data Reviewed: Basic Metabolic Panel:  Recent Labs Lab 09/11/13 0520 09/12/13 0323 09/13/13 0335 09/14/13 0326 09/15/13 0340 09/16/13 0650 09/17/13 0325  NA  --  141 140 143 140 142 141  K  --  4.1 3.6* 4.7 3.9 3.7 3.8  CL  --  111 112 115* 108 106 105  CO2  --  17* 18* 14* 19 26 27   GLUCOSE  --  101* 103* 75 99 93 76  BUN  --  15 14 13 14 14 14   CREATININE  --  1.59* 1.56* 1.57* 1.60* 1.49* 1.50*  CALCIUM  --  7.7* 7.4* 7.6* 7.2* 7.0* 7.4*  MG 1.4* 2.1  --   --   --  1.5 1.5   Liver Function Tests:  Recent Labs Lab 09/11/13 0320  AST 12  ALT 5  ALKPHOS 66  BILITOT 0.4  PROT 4.4*  ALBUMIN 1.4*   No results found for this basename: LIPASE, AMYLASE,  in the last 168 hours No results found for this basename: AMMONIA,  in the last 168 hours CBC:  Recent Labs Lab 09/13/13 0335 09/14/13 0326 09/15/13 0340  09/16/13 0650 09/17/13 0325  WBC 21.1* 22.5* 21.8* 17.0* 17.0*  NEUTROABS 16.1* 17.6* 17.0* 12.1* 12.7*  HGB 11.1* 10.4* 10.2* 10.0* 9.7*  HCT 34.2* 31.6* 29.2* 29.4* 29.1*  MCV 90.0 90.0 87.7 87.5 89.3  PLT 344 335 378 373 406*   Cardiac Enzymes: No results found for this basename: CKTOTAL, CKMB, CKMBINDEX, TROPONINI,  in the last 168 hours BNP (last 3 results)  Recent Labs  07/25/13 0308  PROBNP 4353.0*   CBG:  Recent Labs Lab 09/10/13 1341 09/10/13 1421  GLUCAP 66* 159*    Recent Results (from the past 240 hour(s))  CULTURE, BLOOD (ROUTINE X 2)     Status: None   Collection Time    09/13/13  7:25 AM      Result Value Ref Range Status   Specimen Description BLOOD LEFT ARM   Final   Special Requests BOTTLES DRAWN AEROBIC ONLY 1 CC   Final   Culture  Setup Time     Final   Value: 09/13/2013 12:01     Performed at [facility]   Culture     Final   Value:        BLOOD CULTURE RECEIVED NO GROWTH TO DATE CULTURE WILL BE HELD FOR 5 DAYS BEFORE ISSUING A FINAL NEGATIVE REPORT     Performed at [facility]   Report Status PENDING   Incomplete  CULTURE, BLOOD (ROUTINE X 2)  Status: None   Collection Time    09/13/13  8:29 AM      Result Value Ref Range Status   Specimen Description BLOOD LEFT AC   Final   Special Requests BOTTLES DRAWN AEROBIC AND ANAEROBIC 6 CC   Final   Culture  Setup Time     Final   Value: 09/13/2013 12:01     Performed at [facility]   Culture     Final   Value:        BLOOD CULTURE RECEIVED NO GROWTH TO DATE CULTURE WILL BE HELD FOR 5 DAYS BEFORE ISSUING A FINAL NEGATIVE REPORT     Performed at [facility]   Report Status PENDING   Incomplete  URINE CULTURE     Status: None   Collection Time    09/13/13 10:20 AM      Result Value Ref Range Status   Specimen Description URINE, CATHETERIZED   Final   Special Requests NONE   Final   Culture  Setup Time     Final   Value: 09/13/2013 12:30     Performed at  [facility]     Final   Value: NO GROWTH     Performed at [facility]   Culture     Final   Value: NO GROWTH     Performed at [facility]   Report Status 09/14/2013 FINAL   Final     Studies: No results found.  Scheduled Meds: . albuterol  2.5 mg Nebulization BID  . amiodarone  200 mg Oral Daily  . antiseptic oral rinse  15 mL Mouth Rinse q12n4p  . chlorhexidine  15 mL Mouth Rinse BID  . ciprofloxacin  500 mg Oral BID  . cloNIDine  0.2 mg Transdermal Weekly  . feeding supplement (ENSURE COMPLETE)  237 mL Oral TID WC  . hydrALAZINE  25 mg Oral 3 times per day  . isosorbide mononitrate  120 mg Oral Daily  . magnesium oxide  400 mg Oral BID  . metoprolol tartrate  25 mg Oral BID  . metroNIDAZOLE  500 mg Oral 3 times per day  . saccharomyces boulardii  250 mg Oral BID  . tiotropium  18 mcg Inhalation Daily  . vancomycin (VANCOCIN) rectal ENEMA  500 mg Rectal 4 times per day   Continuous Infusions: .  sodium bicarbonate  infusion 1000 mL Stopped (09/16/13 1115)    Principal Problem:   Acute perforated appendicitis Active Problems:   Clostridium difficile colitis   COPD (chronic obstructive pulmonary disease) with emphysema   Nausea and vomiting   ARF (acute renal failure)   Diastolic CHF, chronic   Chronic respiratory failure with hypoxia   Ventricular tachycardia, sustained   Protein-calorie malnutrition, severe   Wide-complex tachycardia   Hypokalemia   Hypomagnesemia    Time spent: 35 mins    [facility] MD [facility] Pager [phone]. If 7PM-7AM, please contact night-coverage at [email or web address], password [facility] 09/17/2013, 9:24 AM  LOS: 13 days

## 2013-09-17 NOTE — Consult Note (Addendum)
[facility]ious [facility]  Date of Admission:  09/04/2013  Date of Consult:  09/17/2013  Reason for Consult: C diff, apendicitis Referring Physician: Thompson  Impression/Recommendation C diff- community acquired.  Appendicitis SBO Ovarian Cysts Decubitus ulcer R heel Protein Calorie Malnutrition, severe  Would Stop cipro when ok with surgery Continue vanco for 6 weeks (oral) Restart oral vanco (so she will be on oral and rectal) Stop rectal at day 14 Stop flagyl at day 14 if she continues to improve Nutrition f/u [facility]OC eval.  Consider MRI of her heel Consider ABI of LE  Comment-  She is improving (I believe) as her WBC is better.    Thank you so much for this interesting consult,   [name] (pager) [phone] [email or web address]  Renee Donovan is an 80 y.o. female.  HPI: 80 yo F adm on 6-10 with O2 dependent COPD, adm from SNF with abd pain and v-tach.  Her CT showed: Probable acute versus possibly early perforated appendicitis as evidenced by 2.9 x 2.3 cm inflammatory mass adjacent to the cecum. No abscess or pneumoperitoneum. Low-grade partial small bowel obstruction or, ileus from adjacent appendiceal process. She was started on zosyn. She was also noted to have ARF (Cr 3.05. Baseline 1.8). Cr now down to 1.5. She was found on adm to have C diff, WBC 14.7, was started on flagyl. Oral vanco was added on 6-14 due to increasing WBC.  She underwent repeat CT abd on 6-16: Enlarged thickened appendix with mild periappendiceal infiltrative changes most likely representing a acute appendicitis. No definite evidence of perforation or abscess. Bowel wall thickening extending from the rectum to the distal transverse colon raising question of colitis new since previous exam. By 6-19 her WBC had increased further (21.1) and her po vanco was changed to vanco enemas.  By 6-21 her zosyn was changed to po cipro.  Her WBC is now down to 17.0. Stable over  last 48h.   Her cardiac enzymes were (-), she was maintained on beta-blockade after getting a single dose of amio until 6-14 when she required IV amio.    Past Medical History  Diagnosis Date  . Hyperlipidemia   . Hypertension   . DDD (degenerative disc disease)   . DJD (degenerative joint disease)     ankles  . Cluster headache     remote  . COPD (chronic obstructive pulmonary disease)     Golds Stage II feV1 63% 11/2009  . Thyroid disorder   . Breast cancer 1991    s/p R lumpectomy and radiation     Past Surgical History  Procedure Laterality Date  . Nose surgery  1993  . Chin lift  [id number]  . Toe surgery  1965    5th toe- right foot  . Breast lumpectomy  1991    Right breast     No Known Allergies  Medications:  Scheduled: . albuterol  2.5 mg Nebulization BID  . amiodarone  200 mg Oral Daily  . antiseptic oral rinse  15 mL Mouth Rinse q12n4p  . chlorhexidine  15 mL Mouth Rinse BID  . ciprofloxacin  500 mg Oral BID  . cloNIDine  0.2 mg Transdermal Weekly  . feeding supplement (ENSURE COMPLETE)  237 mL Oral TID WC  . hydrALAZINE  25 mg Oral 3 times per day  . isosorbide mononitrate  120 mg Oral Daily  . magnesium oxide  400 mg Oral BID  . magnesium sulfate 1 - 4 g bolus  IVPB  3 g Intravenous Once  . metoprolol tartrate  25 mg Oral BID  . metroNIDAZOLE  500 mg Oral 3 times per day  . saccharomyces boulardii  250 mg Oral BID  . tiotropium  18 mcg Inhalation Daily  . vancomycin (VANCOCIN) rectal ENEMA  500 mg Rectal 4 times per day    Abtx:  Anti-infectives   Start     Dose/Rate Route Frequency Ordered Stop   09/15/13 2000  ciprofloxacin (CIPRO) tablet 500 mg     500 mg Oral 2 times daily 09/15/13 1356     09/13/13 1400  metroNIDAZOLE (FLAGYL) tablet 500 mg     500 mg Oral 3 times per day 09/13/13 0931     09/13/13 1200  vancomycin (VANCOCIN) 500 mg in sodium chloride irrigation 0.9 % 100 mL ENEMA     500 mg Rectal 4 times per day 09/13/13 0842     09/08/13  1400  piperacillin-tazobactam (ZOSYN) IVPB 3.375 g  Status:  Discontinued     3.375 g 12.5 mL/hr over 240 Minutes Intravenous Every 8 hours 09/08/13 0915 09/15/13 1356   09/08/13 1200  vancomycin (VANCOCIN) 50 mg/mL oral solution 250 mg  Status:  Discontinued     250 mg Oral 4 times per day 09/08/13 1016 09/13/13 0842   09/05/13 1000  metroNIDAZOLE (FLAGYL) IVPB 500 mg  Status:  Discontinued     500 mg 100 mL/hr over 60 Minutes Intravenous 3 times per day 09/05/13 0834 09/13/13 0930   09/05/13 0930  metroNIDAZOLE (FLAGYL) IVPB 500 mg  Status:  Discontinued     500 mg 100 mL/hr over 60 Minutes Intravenous Every 8 hours 09/05/13 0927 09/05/13 0939   09/05/13 0800  piperacillin-tazobactam (ZOSYN) IVPB 2.25 g  Status:  Discontinued     2.25 g 100 mL/hr over 30 Minutes Intravenous Every 6 hours 09/05/13 0213 09/08/13 0915   09/05/13 0030  piperacillin-tazobactam (ZOSYN) IVPB 3.375 g     3.375 g 100 mL/hr over 30 Minutes Intravenous  Once 09/05/13 0028 09/05/13 0128      Total days of antibiotics: 13 rectal vanco, po cipro, oral flagyl)          Social History:  reports that she has quit smoking. Her smoking use included Cigarettes. She has a 30 pack-year smoking history. She has never used smokeless tobacco. She reports that she drinks alcohol. She reports that she does not use illicit drugs.  Family History  Problem Relation Age of Onset  . Coronary artery disease Father   . Heart attack Father   . Diabetes Father   . Breast cancer Mother     General ROS: unsure if she has loose BM, no sob or cough, off O2, normal urine, see HPI.   Blood pressure 149/53, pulse 59, temperature 98.2 F (36.8 C), temperature source Oral, resp. rate 20, height _0  (1.676 m), weight 75.8 kg (167 lb 1.7 oz), SpO2 95.00%. General appearance: alert, cooperative and no distress Eyes: negative findings: pupils equal, round, reactive to light and accomodation Throat: normal findings: oropharynx pink & moist  without lesions or evidence of thrush Neck: no adenopathy and supple, symmetrical, trachea midline Lungs: clear to auscultation bilaterally Heart: regular rate and rhythm Abdomen: normal findings: bowel sounds normal and soft and abnormal findings:  distended, hypoactive bowel sounds and diffuse tenderness, no R/G.  Extremities: edema anasarca and decubitus R heel, depth unlcear, tender.  Skin: skin is very thin. darkened nails. 2+ pulse R foot.  Results for orders placed during the hospital encounter of 09/04/13 (from the past 48 hour(s))  CBC WITH DIFFERENTIAL     Status: Abnormal   Collection Time    09/16/13  6:50 AM      Result Value Ref Range   WBC 17.0 (*) 4.0 - 10.5 K/uL   RBC 3.36 (*) 3.87 - 5.11 MIL/uL   Hemoglobin 10.0 (*) 12.0 - 15.0 g/dL   HCT 29.4 (*) 36.0 - 46.0 %   MCV 87.5  78.0 - 100.0 fL   MCH 29.8  26.0 - 34.0 pg   MCHC 34.0  30.0 - 36.0 g/dL   RDW 15.7 (*) 11.5 - 15.5 %   Platelets 373  150 - 400 K/uL   Neutrophils Relative % 71  43 - 77 %   Lymphocytes Relative 14  12 - 46 %   Monocytes Relative 12  3 - 12 %   Eosinophils Relative 3  0 - 5 %   Basophils Relative 0  0 - 1 %   Neutro Abs 12.1 (*) 1.7 - 7.7 K/uL   Lymphs Abs 2.4  0.7 - 4.0 K/uL   Monocytes Absolute 2.0 (*) 0.1 - 1.0 K/uL   Eosinophils Absolute 0.5  0.0 - 0.7 K/uL   Basophils Absolute 0.0  0.0 - 0.1 K/uL   WBC Morphology MILD LEFT SHIFT (1-5% METAS, OCC MYELO, OCC BANDS)    BASIC METABOLIC PANEL     Status: Abnormal   Collection Time    09/16/13  6:50 AM      Result Value Ref Range   Sodium 142  137 - 147 mEq/L   Potassium 3.7  3.7 - 5.3 mEq/L   Chloride 106  96 - 112 mEq/L   CO2 26  19 - 32 mEq/L   Glucose, Bld 93  70 - 99 mg/dL   BUN 14  6 - 23 mg/dL   Creatinine, Ser 1.49 (*) 0.50 - 1.10 mg/dL   Calcium 7.0 (*) 8.4 - 10.5 mg/dL   GFR calc non Af Amer 32 (*) >90 mL/min   GFR calc Af Amer 37 (*) >90 mL/min   Comment: (NOTE)     The eGFR has been calculated using the CKD EPI  equation.     This calculation has not been validated in all clinical situations.     eGFR's persistently <90 mL/min signify possible Chronic Kidney     Disease.  MAGNESIUM     Status: None   Collection Time    09/16/13  6:50 AM      Result Value Ref Range   Magnesium 1.5  1.5 - 2.5 mg/dL  CBC WITH DIFFERENTIAL     Status: Abnormal   Collection Time    09/17/13  3:25 AM      Result Value Ref Range   WBC 17.0 (*) 4.0 - 10.5 K/uL   RBC 3.26 (*) 3.87 - 5.11 MIL/uL   Hemoglobin 9.7 (*) 12.0 - 15.0 g/dL   HCT 29.1 (*) 36.0 - 46.0 %   MCV 89.3  78.0 - 100.0 fL   MCH 29.8  26.0 - 34.0 pg   MCHC 33.3  30.0 - 36.0 g/dL   RDW 16.0 (*) 11.5 - 15.5 %   Platelets 406 (*) 150 - 400 K/uL   Neutrophils Relative % 75  43 - 77 %   Neutro Abs 12.7 (*) 1.7 - 7.7 K/uL   Lymphocytes Relative 13  12 - 46 %   Lymphs Abs  2.2  0.7 - 4.0 K/uL   Monocytes Relative 10  3 - 12 %   Monocytes Absolute 1.7 (*) 0.1 - 1.0 K/uL   Eosinophils Relative 2  0 - 5 %   Eosinophils Absolute 0.3  0.0 - 0.7 K/uL   Basophils Relative 0  0 - 1 %   Basophils Absolute 0.1  0.0 - 0.1 K/uL  BASIC METABOLIC PANEL     Status: Abnormal   Collection Time    09/17/13  3:25 AM      Result Value Ref Range   Sodium 141  137 - 147 mEq/L   Potassium 3.8  3.7 - 5.3 mEq/L   Chloride 105  96 - 112 mEq/L   CO2 27  19 - 32 mEq/L   Glucose, Bld 76  70 - 99 mg/dL   BUN 14  6 - 23 mg/dL   Creatinine, Ser 1.50 (*) 0.50 - 1.10 mg/dL   Calcium 7.4 (*) 8.4 - 10.5 mg/dL   GFR calc non Af Amer 31 (*) >90 mL/min   GFR calc Af Amer 36 (*) >90 mL/min   Comment: (NOTE)     The eGFR has been calculated using the CKD EPI equation.     This calculation has not been validated in all clinical situations.     eGFR's persistently <90 mL/min signify possible Chronic Kidney     Disease.  MAGNESIUM     Status: None   Collection Time    09/17/13  3:25 AM      Result Value Ref Range   Magnesium 1.5  1.5 - 2.5 mg/dL      Component Value Date/Time    SDES URINE, CATHETERIZED 09/13/2013 1020   SPECREQUEST NONE 09/13/2013 1020   CULT  Value: NO GROWTH Performed at [facility] 09/13/2013 1020   REPTSTATUS 09/14/2013 FINAL 09/13/2013 1020   No results found. Recent Results (from the past 240 hour(s))  CULTURE, BLOOD (ROUTINE X 2)     Status: None   Collection Time    09/13/13  7:25 AM      Result Value Ref Range Status   Specimen Description BLOOD LEFT ARM   Final   Special Requests BOTTLES DRAWN AEROBIC ONLY 1 CC   Final   Culture  Setup Time     Final   Value: 09/13/2013 12:01     Performed at [facility]   Culture     Final   Value:        BLOOD CULTURE RECEIVED NO GROWTH TO DATE CULTURE WILL BE HELD FOR 5 DAYS BEFORE ISSUING A FINAL NEGATIVE REPORT     Performed at [facility]   Report Status PENDING   Incomplete  CULTURE, BLOOD (ROUTINE X 2)     Status: None   Collection Time    09/13/13  8:29 AM      Result Value Ref Range Status   Specimen Description BLOOD LEFT AC   Final   Special Requests BOTTLES DRAWN AEROBIC AND ANAEROBIC 6 CC   Final   Culture  Setup Time     Final   Value: 09/13/2013 12:01     Performed at [facility]   Culture     Final   Value:        BLOOD CULTURE RECEIVED NO GROWTH TO DATE CULTURE WILL BE HELD FOR 5 DAYS BEFORE ISSUING A FINAL NEGATIVE REPORT     Performed at [facility]   Report  Status PENDING   Incomplete  URINE CULTURE     Status: None   Collection Time    09/13/13 10:20 AM      Result Value Ref Range Status   Specimen Description URINE, CATHETERIZED   Final   Special Requests NONE   Final   Culture  Setup Time     Final   Value: 09/13/2013 12:30     Performed at [facility] Count     Final   Value: NO GROWTH     Performed at [facility]   Culture     Final   Value: NO GROWTH     Performed at [facility]   Report Status 09/14/2013 FINAL   Final      09/17/2013, 11:34 AM     LOS: 13 days      **Disclaimer: This note may have been dictated with voice recognition software. Similar sounding words can inadvertently be transcribed and this note may contain transcription errors which may not have been corrected upon publication of note.**

## 2013-09-17 NOTE — Progress Notes (Signed)
NUTRITION FOLLOW UP  Intervention:   -Recommend Resource Breeze po TID, each supplement provides 250 kcal and 9 grams of protein -Discontinue Ensure Complete per pt preference -Encouraged intake of kcal/protein for skin integrity -Will continue to monitor  Nutrition Dx:   Inadequate oral intake related to altered gi function as evidenced by npo status-improving with Dys1 diet advancement  Goal:   Pt to meet >/= 90% of their estimated nutrition needs-progressing with diet advancement    Monitor:   Total protein/energy intake, labs, weights, GI profile  Assessment:   6/11: 6/11: Positive C. Diff. Skin is crepe like, black on toes and heals and pink on hip. Currently NPO for non-operative management of perforated appendicitis vs cecal diverticulitis.- Bowel rest and IV antibiotics. Patient reports poor intake for the last 2 months. Per e-chart, weight was 176 lbs 07/29/13. Loss of 31 lbs in the past month. Expect loss of fluid muscle mass and body fat. Basic observation of physical status noted.  Patient meets criteria for severe malnutrition related to chronic illness AEB 19% weight loss in the last month, intake less than 75% for >1 month, and decreased body fat and muscle mass.    6/17: -Has been on bowel rest. Advanced to clear liquid diet on 6/17 -Assisted pt in ordering breakfast- was hungry and eager for diet advancement. Declined supplements; dislikes taste of Resource Breeze -Continues to experience loose stools d/t to C.diff -Low K/Mg- likely r/t to loose stools, currently being repleted -Wt increased 14 lbs over past 6 days, likely d/t fluids vs weight method  6/23: -Pt's diet advanced to Dys1 on 6/21, PO intake 25%.  -MD ordered Ensure Complete TID on 6/21. Pt reported not enjoying the taste and feels as if she is consuming enough nutrition via PO intake. Explained rationale for additional kcal/protein during illness recovery and for skin integrity. Pt verbalized understanding  and was willing to incorporate supplement alternative -Discussed discontinuing Ensure and replacing with Resource Breeze, pt in agreement -Denied nausea/abd pain post meals -Continues with loose stools r/t c.diff colitis -WOC evaluation pending right heel ulcer  Height: Ht Readings from Last 1 Encounters:  09/05/13 5\' 6"  (1.676 m)    Weight Status:   Wt Readings from Last 1 Encounters:  09/16/13 167 lb 1.7 oz (75.8 kg)  09/05/13 141 lbs  Re-estimated needs:  Kcal: 1500-1600  Protein: 70-80 gm protein  Fluid: 1.5L daily   Skin: Bilateral black ulcer areas on feet, necrotic areas on right heel, skin tear on bottom  Diet Order: Dysphagia 1   Intake/Output Summary (Last 24 hours) at 09/17/13 1335 Last data filed at 09/17/13 0930  Gross per 24 hour  Intake    100 ml  Output    800 ml  Net   -700 ml    Last BM: 6/21   Labs:   Recent Labs Lab 09/12/13 0323  09/15/13 0340 09/16/13 0650 09/17/13 0325  NA 141  < > 140 142 141  K 4.1  < > 3.9 3.7 3.8  CL 111  < > 108 106 105  CO2 17*  < > 19 26 27   BUN 15  < > 14 14 14   CREATININE 1.59*  < > 1.60* 1.49* 1.50*  CALCIUM 7.7*  < > 7.2* 7.0* 7.4*  MG 2.1  --   --  1.5 1.5  GLUCOSE 101*  < > 99 93 76  < > = values in this interval not displayed.  CBG (last 3)  No results found  for this basename: GLUCAP,  in the last 72 hours  Scheduled Meds: . albuterol  2.5 mg Nebulization BID  . amiodarone  200 mg Oral Daily  . antiseptic oral rinse  15 mL Mouth Rinse q12n4p  . chlorhexidine  15 mL Mouth Rinse BID  . ciprofloxacin  500 mg Oral BID  . cloNIDine  0.2 mg Transdermal Weekly  . feeding supplement (RESOURCE BREEZE)  1 Container Oral TID BM  . hydrALAZINE  25 mg Oral 3 times per day  . isosorbide mononitrate  120 mg Oral Daily  . magnesium oxide  400 mg Oral BID  . magnesium sulfate 1 - 4 g bolus IVPB  3 g Intravenous Once  . metoprolol tartrate  25 mg Oral BID  . metroNIDAZOLE  500 mg Oral 3 times per day  .  saccharomyces boulardii  250 mg Oral BID  . tiotropium  18 mcg Inhalation Daily  . vancomycin  125 mg Oral 4 times per day  . vancomycin (VANCOCIN) rectal ENEMA  500 mg Rectal 4 times per day    Continuous Infusions: .  sodium bicarbonate  infusion 1000 mL Stopped (09/16/13 1115)    Atlee Abide MS RD LDN Clinical Dietitian [phone]

## 2013-09-17 NOTE — Progress Notes (Signed)
Received report form D.Owens Shark, RN. No change in previous assessment.

## 2013-09-18 DIAGNOSIS — L8995 Pressure ulcer of unspecified site, unstageable: Secondary | ICD-10-CM

## 2013-09-18 DIAGNOSIS — L89509 Pressure ulcer of unspecified ankle, unspecified stage: Secondary | ICD-10-CM

## 2013-09-18 DIAGNOSIS — N179 Acute kidney failure, unspecified: Secondary | ICD-10-CM

## 2013-09-18 LAB — BASIC METABOLIC PANEL
BUN: 14 mg/dL (ref 6–23)
CHLORIDE: 105 meq/L (ref 96–112)
CO2: 27 meq/L (ref 19–32)
CREATININE: 1.48 mg/dL — AB (ref 0.50–1.10)
Calcium: 7.5 mg/dL — ABNORMAL LOW (ref 8.4–10.5)
GFR calc Af Amer: 37 mL/min — ABNORMAL LOW (ref >90)
GFR calc non Af Amer: 32 mL/min — ABNORMAL LOW (ref >90)
GLUCOSE: 76 mg/dL (ref 70–99)
Potassium: 3.8 mEq/L (ref 3.7–5.3)
Sodium: 141 mEq/L (ref 137–147)

## 2013-09-18 LAB — MAGNESIUM: Magnesium: 1.7 mg/dL (ref 1.5–2.5)

## 2013-09-18 NOTE — Progress Notes (Signed)
Gen. Surgery:  I've interviewed and examined this patient this morning. I agree with the assessment and treatment plan outlined by [name],NP.  Patient is clinically improving. She has poor insight into her medical problems and care. Abdomen soft Appreciate ID recommendations.. Following discharge, followup with Dr. Zella Richer in the office in approximately 3 weeks regarding long-term management of her appendicitis.   Renee Donovan. Dalbert Batman, M.D., [facility] General and Minimally invasive Surgery Breast and Colorectal Surgery Office:   [phone]

## 2013-09-18 NOTE — Progress Notes (Signed)
[facility] PROGRESS NOTE  Renee Donovan EPP:[id number] DOB: 1930-05-15 DOA: 09/04/2013 PCP: [name], MD Interim summary: 80 year old female with past medical history of COPD on 2 L home oxygen, osteoarthritis, from SNF who presented to [facility] ED 09/04/2013 with worsening nausea, vomiting, diarrhea and associated lower abdominal pain for past 5 days prior to this admission. On admission, patient was found to have possible perforated appendicitis for which surgery was consulted. In addition, patient had about 10 minute episode of wide complex tachycardia which has resolved with a bolus of amiodarone. She was found to have acute appendicitis, surgery consulted , recommended medical management. She will probably need a follow up with surgery for possible appendectomy.   Assessment/Plan: #1 acute appendicitis Patient with some clinical improvement. Per general surgery repeat CT scan showing improvement of appendicitis. Colitis noted on CT scan. WBC is trending down. Afebrile. Continue oral ciprofloxacin and Flagyl. Patient is tolerating pured diet. General surgeon following and appreciate input and recommendations.  #2 C. difficile colitis Patient noted to have multiple loose stools. WBC trending down. Abdominal pain improved. Continue vancomycin enema day #11/14. Continue oral Flagyl day #13/14. Will add fluorastor. Due to continued multiple loose stools and leukocytosis, we have consulted with ID for further recommendations on antibiotic management. Recommended to stop ciprofloxacin and continue with oral vancomycin for a total of 6 weeks. To stop rectal vancomycin and flagyl after finishing up the 14 day course.   #3 chronic diastolic CHF Stable. Grade 2 diastolic dysfunction noted on 2-D echo of 09/05/2013 with a preserved EF. Continue beta blocker, imdur, hydralazine. Per cardiology.  #4 wide complex tachycardia Episode noted on the night of 09/08/2013. Patient has been started on  amiodarone per cardiology. Amiodarone dose has been changed to 200 mg daily for the next 8 weeks and then DC per cardiology. Continue beta blocker. Outpatient followup with Dr. Percival Spanish of cardiology as outpatient.  #5 COPD with emphysema Stable. Continue Spiriva.  #6 acute on chronic kidney disease stage IV Baseline creatinine approximately 1.8. On admission was 3.05 in the setting of acute perforated appendicitis and also in the setting of ACE inhibitor. ACE inhibitor has been held. Renal function stable and currently at 1.50. Stable. Monitor fluid status.  #7 hypokalemia/hypomagnesemia Likely secondary to GI losses from C. difficile colitis. Repleted.  #8 anemia Likely dilutional in nature. H&H stable. Follow.  #9 hypertension Continue Lopressor, imdur, hydralazine, clonidine patch.  #10 adnexal cystic lesions Will need outpatient abdominal ultrasound followup.  #11 leukocytosis Questionable etiology. Likely secondary to C. difficile colitis. Chest x-ray shows new left lung base opacity most likely combination of small effusion with either infiltrate or atelectasis. Patient compelted 10 day of IV Zosyn and has no respiratory symptoms and unlikely to be a pneumonia. Urine cultures negative. Continue vancomycin enema, oral Flagyl, Follow.  #12 prophylaxis SCDs for DVT prophylaxis.  #13 Decubitus right heel ulcer: MRI of the heel ordered. ABI of the lower extremities ordered. Wound care consulted.   Code Status: DO NOT RESUSCITATE Family Communication: Updated patient no family at bedside. Disposition Plan:SNF when medically stable.   Consultants:  Cardiology: Dr. Elias Else 09/05/2013   General surgery: Dr. Excell Seltzer 09/05/2013  Procedures: Ct Abdomen Pelvis Wo Contras 09/05/2013 IMPRESSION: Probable acute versus possibly early perforated appendicitis as evidenced by 2.9 x 2.3 cm inflammatory mass adjacent to the cecum. No abscess or pneumoperitoneum. Low-grade partial small  bowel obstruction or, ileus from adjacent appendiceal process. Bilaterals cystic adnexal masses, smaller on the left which  would be atypical for neoplasm. Recommend follow-up ultrasound for further characterization. Small right pleural effusion with right lower lobe airspace opacity which may reflect atelectasis, less likely early pneumonia. Severe aortic atherosclerosis was similar multi segmental ectasia. Dg Chest Portable 1 View 09/04/2013 IMPRESSION: No acute abnormality. Probable emphysema  CT abdomen and pelvis without contrast 09/10/2013 IMPRESSION:  Enlarged thickened appendix with mild periappendiceal infiltrative  changes most likely representing a acute appendicitis.  No definite evidence of perforation or abscess.  Bowel wall thickening extending from the rectum to the distal  transverse colon raising question of colitis new since previous  exam.  Cholelithiasis.  Small abdominal aortic aneurysms.  BILATERAL ovarian cysts up to 4.8 cm in size ; recommend followup  nonemergent sonographic characterization.  Chest x-ray 09/04/2013 2-D echo 09/05/2013   Antibiotics: IV Zosyn 09/04/2013 -->09/15/13  IV Flagyl 09/05/2013 --> 09/13/13 Vancomycin PO 6/14 -->09/13/13 Vancomycin enema 09/13/13 Oral Flagyl 09/13/13 Oral cipro 09/15/13   HPI/Subjective:  No nausea, no vomiting. Has rectaltube.   Objective: Filed Vitals:   09/18/13 0630  BP: 151/69  Pulse: 63  Temp: 97.8 F (36.6 C)  Resp: 18    Intake/Output Summary (Last 24 hours) at 09/18/13 1112 Last data filed at 09/18/13 8527  Gross per 24 hour  Intake    400 ml  Output    600 ml  Net   -200 ml   Filed Weights   09/14/13 0500 09/16/13 0542 09/18/13 0630  Weight: 77.9 kg (171 lb 11.8 oz) 75.8 kg (167 lb 1.7 oz) 80 kg (176 lb 5.9 oz)    Exam:   General:  NAD  Cardiovascular: RRR  Respiratory: CTAB  Abdomen: Soft/ND/ min-mild TTP/+BS  Musculoskeletal: right heel ulcer  Data Reviewed: Basic Metabolic  Panel:  Recent Labs Lab 09/12/13 0323  09/14/13 0326 09/15/13 0340 09/16/13 0650 09/17/13 0325 09/18/13 0504  NA 141  < > 143 140 142 141 141  K 4.1  < > 4.7 3.9 3.7 3.8 3.8  CL 111  < > 115* 108 106 105 105  CO2 17*  < > 14* 19 26 27 27   GLUCOSE 101*  < > 75 99 93 76 76  BUN 15  < > 13 14 14 14 14   CREATININE 1.59*  < > 1.57* 1.60* 1.49* 1.50* 1.48*  CALCIUM 7.7*  < > 7.6* 7.2* 7.0* 7.4* 7.5*  MG 2.1  --   --   --  1.5 1.5 1.7  < > = values in this interval not displayed. Liver Function Tests: No results found for this basename: AST, ALT, ALKPHOS, BILITOT, PROT, ALBUMIN,  in the last 168 hours No results found for this basename: LIPASE, AMYLASE,  in the last 168 hours No results found for this basename: AMMONIA,  in the last 168 hours CBC:  Recent Labs Lab 09/13/13 0335 09/14/13 0326 09/15/13 0340 09/16/13 0650 09/17/13 0325  WBC 21.1* 22.5* 21.8* 17.0* 17.0*  NEUTROABS 16.1* 17.6* 17.0* 12.1* 12.7*  HGB 11.1* 10.4* 10.2* 10.0* 9.7*  HCT 34.2* 31.6* 29.2* 29.4* 29.1*  MCV 90.0 90.0 87.7 87.5 89.3  PLT 344 335 378 373 406*   Cardiac Enzymes: No results found for this basename: CKTOTAL, CKMB, CKMBINDEX, TROPONINI,  in the last 168 hours BNP (last 3 results)  Recent Labs  07/25/13 0308  PROBNP 4353.0*   CBG: No results found for this basename: GLUCAP,  in the last 168 hours  Recent Results (from the past 240 hour(s))  CULTURE, BLOOD (ROUTINE X 2)  Status: None   Collection Time    09/13/13  7:25 AM      Result Value Ref Range Status   Specimen Description BLOOD LEFT ARM   Final   Special Requests BOTTLES DRAWN AEROBIC ONLY 1 CC   Final   Culture  Setup Time     Final   Value: 09/13/2013 12:01     Performed at [facility]   Culture     Final   Value:        BLOOD CULTURE RECEIVED NO GROWTH TO DATE CULTURE WILL BE HELD FOR 5 DAYS BEFORE ISSUING A FINAL NEGATIVE REPORT     Performed at [facility]   Report Status PENDING   Incomplete   CULTURE, BLOOD (ROUTINE X 2)     Status: None   Collection Time    09/13/13  8:29 AM      Result Value Ref Range Status   Specimen Description BLOOD LEFT AC   Final   Special Requests BOTTLES DRAWN AEROBIC AND ANAEROBIC 6 CC   Final   Culture  Setup Time     Final   Value: 09/13/2013 12:01     Performed at [facility]   Culture     Final   Value:        BLOOD CULTURE RECEIVED NO GROWTH TO DATE CULTURE WILL BE HELD FOR 5 DAYS BEFORE ISSUING A FINAL NEGATIVE REPORT     Performed at [facility]   Report Status PENDING   Incomplete  URINE CULTURE     Status: None   Collection Time    09/13/13 10:20 AM      Result Value Ref Range Status   Specimen Description URINE, CATHETERIZED   Final   Special Requests NONE   Final   Culture  Setup Time     Final   Value: 09/13/2013 12:30     Performed at [facility]     Final   Value: NO GROWTH     Performed at [facility]   Culture     Final   Value: NO GROWTH     Performed at [facility]   Report Status 09/14/2013 FINAL   Final     Studies: No results found.  Scheduled Meds: . albuterol  2.5 mg Nebulization BID  . amiodarone  200 mg Oral Daily  . antiseptic oral rinse  15 mL Mouth Rinse q12n4p  . chlorhexidine  15 mL Mouth Rinse BID  . ciprofloxacin  500 mg Oral BID  . cloNIDine  0.2 mg Transdermal Weekly  . feeding supplement (RESOURCE BREEZE)  1 Container Oral TID BM  . hydrALAZINE  25 mg Oral 3 times per day  . isosorbide mononitrate  120 mg Oral Daily  . magnesium oxide  400 mg Oral BID  . magnesium sulfate 1 - 4 g bolus IVPB  3 g Intravenous Once  . metoprolol tartrate  25 mg Oral BID  . metroNIDAZOLE  500 mg Oral 3 times per day  . nystatin   Topical TID  . saccharomyces boulardii  250 mg Oral BID  . tiotropium  18 mcg Inhalation Daily  . vancomycin  125 mg Oral 4 times per day  . vancomycin (VANCOCIN) rectal ENEMA  500 mg Rectal 4 times per day   Continuous  Infusions: .  sodium bicarbonate  infusion 1000 mL Stopped (09/16/13 1115)    Principal Problem:   Acute  perforated appendicitis Active Problems:   COPD (chronic obstructive pulmonary disease) with emphysema   Nausea and vomiting   ARF (acute renal failure)   Diastolic CHF, chronic   Chronic respiratory failure with hypoxia   Ventricular tachycardia, sustained   Protein-calorie malnutrition, severe   Wide-complex tachycardia   Hypokalemia   Hypomagnesemia   Clostridium difficile colitis    Time spent: 70 mins    Naara Kelty MD [facility] Pager [phone]. If 7PM-7AM, please contact night-coverage at [email or web address], password [facility] 09/18/2013, 11:12 AM  LOS: 14 days

## 2013-09-18 NOTE — Progress Notes (Signed)
Patient ID: Renee Donovan, female   DOB: 24-Jun-1930, 80 y.o.   MRN: [id number]  Subjective: In good spirits.  Tolerating diet.  Rectal tube in place, 1469m out.  Denies pain, n/v.   Objective:  Vital signs:  Filed Vitals:   09/17/13 0744 09/17/13 1456 09/17/13 2049 09/18/13 0630  BP:  123/53 137/57 151/69  Pulse:  68 60 63  Temp:  97.8 F (36.6 C) 98 F (36.7 C) 97.8 F (36.6 C)  TempSrc:  Oral Oral Oral  Resp:  18 18 18   Height:      Weight:    176 lb 5.9 oz (80 kg)  SpO2: 95% 95% 91% 92%    Last BM Date: 09/18/13  Intake/Output   Yesterday:  06/23 0701 - 06/24 0700 In: 500 [P.O.:500] Out: 1400 [Stool:1400] This shift:    I/O last 3 completed shifts: In: 500 [P.O.:500] Out: 1400 [Stool:1400]   Physical Exam: General: Pt awake/alert/oriented x4 in no acute distress Abdomen: Soft.  Nondistended. nontedner.  No evidence of peritonitis.  No incarcerated hernias.   Problem List:   Principal Problem:   Acute perforated appendicitis Active Problems:   COPD (chronic obstructive pulmonary disease) with emphysema   Nausea and vomiting   ARF (acute renal failure)   Diastolic CHF, chronic   Chronic respiratory failure with hypoxia   Ventricular tachycardia, sustained   Protein-calorie malnutrition, severe   Wide-complex tachycardia   Hypokalemia   Hypomagnesemia   Clostridium difficile colitis    Results:   Labs: Results for orders placed during the hospital encounter of 09/04/13 (from the past 48 hour(s))  CBC WITH DIFFERENTIAL     Status: Abnormal   Collection Time    09/17/13  3:25 AM      Result Value Ref Range   WBC 17.0 (*) 4.0 - 10.5 K/uL   RBC 3.26 (*) 3.87 - 5.11 MIL/uL   Hemoglobin 9.7 (*) 12.0 - 15.0 g/dL   HCT 29.1 (*) 36.0 - 46.0 %   MCV 89.3  78.0 - 100.0 fL   MCH 29.8  26.0 - 34.0 pg   MCHC 33.3  30.0 - 36.0 g/dL   RDW 16.0 (*) 11.5 - 15.5 %   Platelets 406 (*) 150 - 400 K/uL   Neutrophils Relative % 75  43 - 77 %   Neutro Abs  12.7 (*) 1.7 - 7.7 K/uL   Lymphocytes Relative 13  12 - 46 %   Lymphs Abs 2.2  0.7 - 4.0 K/uL   Monocytes Relative 10  3 - 12 %   Monocytes Absolute 1.7 (*) 0.1 - 1.0 K/uL   Eosinophils Relative 2  0 - 5 %   Eosinophils Absolute 0.3  0.0 - 0.7 K/uL   Basophils Relative 0  0 - 1 %   Basophils Absolute 0.1  0.0 - 0.1 K/uL  BASIC METABOLIC PANEL     Status: Abnormal   Collection Time    09/17/13  3:25 AM      Result Value Ref Range   Sodium 141  137 - 147 mEq/L   Potassium 3.8  3.7 - 5.3 mEq/L   Chloride 105  96 - 112 mEq/L   CO2 27  19 - 32 mEq/L   Glucose, Bld 76  70 - 99 mg/dL   BUN 14  6 - 23 mg/dL   Creatinine, Ser 1.50 (*) 0.50 - 1.10 mg/dL   Calcium 7.4 (*) 8.4 - 10.5 mg/dL   GFR calc non Af AWyvonnia Lora  31 (*) >90 mL/min   GFR calc Af Amer 36 (*) >90 mL/min   Comment: (NOTE)     The eGFR has been calculated using the CKD EPI equation.     This calculation has not been validated in all clinical situations.     eGFR's persistently <90 mL/min signify possible Chronic Kidney     Disease.  MAGNESIUM     Status: None   Collection Time    09/17/13  3:25 AM      Result Value Ref Range   Magnesium 1.5  1.5 - 2.5 mg/dL  BASIC METABOLIC PANEL     Status: Abnormal   Collection Time    09/18/13  5:04 AM      Result Value Ref Range   Sodium 141  137 - 147 mEq/L   Potassium 3.8  3.7 - 5.3 mEq/L   Chloride 105  96 - 112 mEq/L   CO2 27  19 - 32 mEq/L   Glucose, Bld 76  70 - 99 mg/dL   BUN 14  6 - 23 mg/dL   Creatinine, Ser 1.48 (*) 0.50 - 1.10 mg/dL   Calcium 7.5 (*) 8.4 - 10.5 mg/dL   GFR calc non Af Amer 32 (*) >90 mL/min   GFR calc Af Amer 37 (*) >90 mL/min   Comment: (NOTE)     The eGFR has been calculated using the CKD EPI equation.     This calculation has not been validated in all clinical situations.     eGFR's persistently <90 mL/min signify possible Chronic Kidney     Disease.  MAGNESIUM     Status: None   Collection Time    09/18/13  5:04 AM      Result Value Ref Range    Magnesium 1.7  1.5 - 2.5 mg/dL    Imaging / Studies: No results found.  Scheduled Meds: . albuterol  2.5 mg Nebulization BID  . amiodarone  200 mg Oral Daily  . antiseptic oral rinse  15 mL Mouth Rinse q12n4p  . chlorhexidine  15 mL Mouth Rinse BID  . ciprofloxacin  500 mg Oral BID  . cloNIDine  0.2 mg Transdermal Weekly  . feeding supplement (RESOURCE BREEZE)  1 Container Oral TID BM  . hydrALAZINE  25 mg Oral 3 times per day  . isosorbide mononitrate  120 mg Oral Daily  . magnesium oxide  400 mg Oral BID  . magnesium sulfate 1 - 4 g bolus IVPB  3 g Intravenous Once  . metoprolol tartrate  25 mg Oral BID  . metroNIDAZOLE  500 mg Oral 3 times per day  . nystatin   Topical TID  . saccharomyces boulardii  250 mg Oral BID  . tiotropium  18 mcg Inhalation Daily  . vancomycin  125 mg Oral 4 times per day  . vancomycin (VANCOCIN) rectal ENEMA  500 mg Rectal 4 times per day   Continuous Infusions: .  sodium bicarbonate  infusion 1000 mL Stopped (09/16/13 1115)   PRN Meds:.hydrALAZINE, HYDROmorphone (DILAUDID) injection, levalbuterol, ondansetron (ZOFRAN) IV, ondansetron   Antibiotics: Anti-infectives   Start     Dose/Rate Route Frequency Ordered Stop   09/18/13 1000  fluconazole (DIFLUCAN) tablet 100 mg  Status:  Discontinued     100 mg Oral Daily 09/17/13 1607 09/17/13 1628   09/17/13 1615  fluconazole (DIFLUCAN) tablet 200 mg  Status:  Discontinued     200 mg Oral  Once 09/17/13 1606 09/17/13 1628   09/17/13 1230  vancomycin (VANCOCIN) 50 mg/mL oral solution 125 mg     125 mg Oral 4 times per day 09/17/13 1205     09/15/13 2000  ciprofloxacin (CIPRO) tablet 500 mg     500 mg Oral 2 times daily 09/15/13 1356     09/13/13 1400  metroNIDAZOLE (FLAGYL) tablet 500 mg     500 mg Oral 3 times per day 09/13/13 0931     09/13/13 1200  vancomycin (VANCOCIN) 500 mg in sodium chloride irrigation 0.9 % 100 mL ENEMA     500 mg Rectal 4 times per day 09/13/13 0842     09/08/13 1400   piperacillin-tazobactam (ZOSYN) IVPB 3.375 g  Status:  Discontinued     3.375 g 12.5 mL/hr over 240 Minutes Intravenous Every 8 hours 09/08/13 0915 09/15/13 1356   09/08/13 1200  vancomycin (VANCOCIN) 50 mg/mL oral solution 250 mg  Status:  Discontinued     250 mg Oral 4 times per day 09/08/13 1016 09/13/13 0842   09/05/13 1000  metroNIDAZOLE (FLAGYL) IVPB 500 mg  Status:  Discontinued     500 mg 100 mL/hr over 60 Minutes Intravenous 3 times per day 09/05/13 0834 09/13/13 0930   09/05/13 0930  metroNIDAZOLE (FLAGYL) IVPB 500 mg  Status:  Discontinued     500 mg 100 mL/hr over 60 Minutes Intravenous Every 8 hours 09/05/13 0927 09/05/13 0939   09/05/13 0800  piperacillin-tazobactam (ZOSYN) IVPB 2.25 g  Status:  Discontinued     2.25 g 100 mL/hr over 30 Minutes Intravenous Every 6 hours 09/05/13 0213 09/08/13 0915   09/05/13 0030  piperacillin-tazobactam (ZOSYN) IVPB 3.375 g     3.375 g 100 mL/hr over 30 Minutes Intravenous  Once 09/05/13 0028 09/05/13 0128      Assessment/Plan  Acute perforated appendicitis  c diff colitis  -appreciate ID recommendations -zosyn #10 days, then cipro D#3, stop after tonight's dose -continue with pureed diet  -check CBC in AM -we will consider an interval appendectomy in the future, depending on her physical status   Erby Pian, [facility] Pager [phone] Office [phone]  09/18/2013 8:12 AM

## 2013-09-18 NOTE — Consult Note (Addendum)
WOC wound consult note Reason for Consult:Chronic, non-healing pressure ulcer on right heel, Stage III.  Also noted is suspected deep tissue injury pressure ulcer on left heel. Patient with moisture associated skin damage (MASD), specifically IAD (incontinence associated dermatitis and ITD (intertriginous dermatitis).  POC implemented yesterday by [facility] ([facility] Treatment Associate, Clovia Cuff, RN. Wound type:pressure Pressure Ulcer POA: Yes Measurement: Right heel:  2.5cm x 2cm x 0.2cm, noted is 40% presence of soft yellow slough, 60% pink, moist tissue. Left heel with area of purple/red discoloration. Skin intact. Wound bed:AS described above Drainage (amount, consistency, odor) Small amount of light yellow exudate from right heel, none from left heel Periwound:intact Dressing procedure/placement/frequency:I will provide Prevalon Boots as a pressure redistribution strategy bilaterally and provide a soft silicone foam dressing to the left heel to pad and protect it, while offering a moisture retentive environment.  The right heel requires enzymatic debridement and I have recommended collagenase (Santyl) applied once daily. The therapeutic sleep surface with low air loss feature will assist with the MASD and the fungal overgrowth.  Regular bathing and turning and repositioning will assist with the overall hygiene issues. A higher or different level of care is indicated for this patient, but it is of course, her choice. [facility] nursing team will not follow, but will remain available to this patient, the nursing and medical team.  Please re-consult if needed. Thanks, Maudie Flakes, MSN, RN, [place], [place], [place] [phone])

## 2013-09-19 ENCOUNTER — Inpatient Hospital Stay (HOSPITAL_COMMUNITY): Payer: Medicare Other

## 2013-09-19 DIAGNOSIS — M869 Osteomyelitis, unspecified: Secondary | ICD-10-CM

## 2013-09-19 LAB — CULTURE, BLOOD (ROUTINE X 2)
Culture: NO GROWTH
Culture: NO GROWTH

## 2013-09-19 LAB — CBC
HEMATOCRIT: 29 % — AB (ref 36.0–46.0)
HEMOGLOBIN: 9.5 g/dL — AB (ref 12.0–15.0)
MCH: 30.1 pg (ref 26.0–34.0)
MCHC: 32.8 g/dL (ref 30.0–36.0)
MCV: 91.8 fL (ref 78.0–100.0)
Platelets: 464 10*3/uL — ABNORMAL HIGH (ref 150–400)
RBC: 3.16 MIL/uL — ABNORMAL LOW (ref 3.87–5.11)
RDW: 16.3 % — ABNORMAL HIGH (ref 11.5–15.5)
WBC: 16.5 10*3/uL — ABNORMAL HIGH (ref 4.0–10.5)

## 2013-09-19 NOTE — Progress Notes (Signed)
Patient ID: Renee Donovan, female   DOB: 1930/05/24, 80 y.o.   MRN: [id number]  Subjective: No stool output recorded yesterday.  Afebrile.  WBC trending down this AM.    Objective:  Vital signs:  Filed Vitals:   09/18/13 2115 09/18/13 2255 09/19/13 0636 09/19/13 0815  BP: 111/60 124/64 136/69   Pulse: 81 76 67   Temp: 98 F (36.7 C)  97.6 F (36.4 C)   TempSrc: Oral  Oral   Resp: 18  18   Height:      Weight:   177 lb 4 oz (80.4 kg)   SpO2: 91%  93% 95%    Last BM Date: 09/18/13  Intake/Output   Yesterday:  06/24 0701 - 06/25 0700 In: 450 [P.O.:440] Out: -  This shift:    I/O last 3 completed shifts: In: 850 [P.O.:840; Other:10] Out: 600 [Stool:600]    Physical Exam:  General: Pt awake/alert/oriented x4 in no acute distress  Abdomen: Soft. Nondistended. Minimal ttp. No evidence of peritonitis. No incarcerated hernias.   Problem List:   Principal Problem:   Acute perforated appendicitis Active Problems:   COPD (chronic obstructive pulmonary disease) with emphysema   Nausea and vomiting   ARF (acute renal failure)   Diastolic CHF, chronic   Chronic respiratory failure with hypoxia   Ventricular tachycardia, sustained   Protein-calorie malnutrition, severe   Wide-complex tachycardia   Hypokalemia   Hypomagnesemia   Clostridium difficile colitis    Results:   Labs: Results for orders placed during the hospital encounter of 09/04/13 (from the past 48 hour(s))  BASIC METABOLIC PANEL     Status: Abnormal   Collection Time    09/18/13  5:04 AM      Result Value Ref Range   Sodium 141  137 - 147 mEq/L   Potassium 3.8  3.7 - 5.3 mEq/L   Chloride 105  96 - 112 mEq/L   CO2 27  19 - 32 mEq/L   Glucose, Bld 76  70 - 99 mg/dL   BUN 14  6 - 23 mg/dL   Creatinine, Ser 1.48 (*) 0.50 - 1.10 mg/dL   Calcium 7.5 (*) 8.4 - 10.5 mg/dL   GFR calc non Af Amer 32 (*) >90 mL/min   GFR calc Af Amer 37 (*) >90 mL/min   Comment: (NOTE)     The eGFR has been  calculated using the CKD EPI equation.     This calculation has not been validated in all clinical situations.     eGFR's persistently <90 mL/min signify possible Chronic Kidney     Disease.  MAGNESIUM     Status: None   Collection Time    09/18/13  5:04 AM      Result Value Ref Range   Magnesium 1.7  1.5 - 2.5 mg/dL  CBC     Status: Abnormal   Collection Time    09/19/13  4:00 AM      Result Value Ref Range   WBC 16.5 (*) 4.0 - 10.5 K/uL   RBC 3.16 (*) 3.87 - 5.11 MIL/uL   Hemoglobin 9.5 (*) 12.0 - 15.0 g/dL   HCT 29.0 (*) 36.0 - 46.0 %   MCV 91.8  78.0 - 100.0 fL   MCH 30.1  26.0 - 34.0 pg   MCHC 32.8  30.0 - 36.0 g/dL   RDW 16.3 (*) 11.5 - 15.5 %   Platelets 464 (*) 150 - 400 K/uL    Imaging / Studies: No  results found.  Scheduled Meds: . albuterol  2.5 mg Nebulization BID  . amiodarone  200 mg Oral Daily  . antiseptic oral rinse  15 mL Mouth Rinse q12n4p  . chlorhexidine  15 mL Mouth Rinse BID  . cloNIDine  0.2 mg Transdermal Weekly  . feeding supplement (RESOURCE BREEZE)  1 Container Oral TID BM  . hydrALAZINE  25 mg Oral 3 times per day  . isosorbide mononitrate  120 mg Oral Daily  . magnesium oxide  400 mg Oral BID  . magnesium sulfate 1 - 4 g bolus IVPB  3 g Intravenous Once  . metoprolol tartrate  25 mg Oral BID  . metroNIDAZOLE  500 mg Oral 3 times per day  . nystatin   Topical TID  . saccharomyces boulardii  250 mg Oral BID  . tiotropium  18 mcg Inhalation Daily  . vancomycin  125 mg Oral 4 times per day  . vancomycin (VANCOCIN) rectal ENEMA  500 mg Rectal 4 times per day   Continuous Infusions: .  sodium bicarbonate  infusion 1000 mL Stopped (09/16/13 1115)   PRN Meds:.hydrALAZINE, HYDROmorphone (DILAUDID) injection, levalbuterol, ondansetron (ZOFRAN) IV, ondansetron   Antibiotics: Anti-infectives   Start     Dose/Rate Route Frequency Ordered Stop   09/18/13 1000  fluconazole (DIFLUCAN) tablet 100 mg  Status:  Discontinued     100 mg Oral Daily  09/17/13 1607 09/17/13 1628   09/17/13 1615  fluconazole (DIFLUCAN) tablet 200 mg  Status:  Discontinued     200 mg Oral  Once 09/17/13 1606 09/17/13 1628   09/17/13 1230  vancomycin (VANCOCIN) 50 mg/mL oral solution 125 mg     125 mg Oral 4 times per day 09/17/13 1205     09/15/13 2000  ciprofloxacin (CIPRO) tablet 500 mg     500 mg Oral 2 times daily 09/15/13 1356 09/18/13 2043   09/13/13 1400  metroNIDAZOLE (FLAGYL) tablet 500 mg     500 mg Oral 3 times per day 09/13/13 0931     09/13/13 1200  vancomycin (VANCOCIN) 500 mg in sodium chloride irrigation 0.9 % 100 mL ENEMA     500 mg Rectal 4 times per day 09/13/13 0842     09/08/13 1400  piperacillin-tazobactam (ZOSYN) IVPB 3.375 g  Status:  Discontinued     3.375 g 12.5 mL/hr over 240 Minutes Intravenous Every 8 hours 09/08/13 0915 09/15/13 1356   09/08/13 1200  vancomycin (VANCOCIN) 50 mg/mL oral solution 250 mg  Status:  Discontinued     250 mg Oral 4 times per day 09/08/13 1016 09/13/13 0842   09/05/13 1000  metroNIDAZOLE (FLAGYL) IVPB 500 mg  Status:  Discontinued     500 mg 100 mL/hr over 60 Minutes Intravenous 3 times per day 09/05/13 0834 09/13/13 0930   09/05/13 0930  metroNIDAZOLE (FLAGYL) IVPB 500 mg  Status:  Discontinued     500 mg 100 mL/hr over 60 Minutes Intravenous Every 8 hours 09/05/13 0927 09/05/13 0939   09/05/13 0800  piperacillin-tazobactam (ZOSYN) IVPB 2.25 g  Status:  Discontinued     2.25 g 100 mL/hr over 30 Minutes Intravenous Every 6 hours 09/05/13 0213 09/08/13 0915   09/05/13 0030  piperacillin-tazobactam (ZOSYN) IVPB 3.375 g     3.375 g 100 mL/hr over 30 Minutes Intravenous  Once 09/05/13 0028 09/05/13 0128      Assessment/Plan  Acute perforated appendicitis  c diff colitis  -appreciate ID recommendations  -zosyn #10 days, then cipro D#4---coarse completed -continue  with pureed diet  -we will consider an interval appendectomy in the future, depending on her physical status  -further management per  primary team. -will follow  Erby Pian, [facility] Pager [phone] Office [phone]  09/19/2013 8:55 AM

## 2013-09-19 NOTE — Progress Notes (Signed)
[facility] for Infectious Disease  Date of Admission:  09/04/2013  Antibiotics: vanco PR and PO Flagyl IV  Subjective: Feels ok, no abdominal pain  Objective: Temp:  [97.6 F (36.4 C)-98 F (36.7 C)] 97.9 F (36.6 C) (06/25 1424) Pulse Rate:  [66-81] 66 (06/25 1424) Resp:  [18-20] 20 (06/25 1424) BP: (111-141)/(60-69) 141/69 mmHg (06/25 1424) SpO2:  [91 %-96 %] 94 % (06/25 1424) Weight:  [177 lb 4 oz (80.4 kg)] 177 lb 4 oz (80.4 kg) (06/25 0636)  General: awake, alert, nad Skin: no rashes Lungs: CTA B Cor: RRR Abdomen: soft, nt, nd Ext: no edema  Lab Results Lab Results  Component Value Date   WBC 16.5* 09/19/2013   HGB 9.5* 09/19/2013   HCT 29.0* 09/19/2013   MCV 91.8 09/19/2013   PLT 464* 09/19/2013    Lab Results  Component Value Date   CREATININE 1.48* 09/18/2013   BUN 14 09/18/2013   NA 141 09/18/2013   K 3.8 09/18/2013   CL 105 09/18/2013   CO2 27 09/18/2013    Lab Results  Component Value Date   ALT 5 09/11/2013   AST 12 09/11/2013   ALKPHOS 66 09/11/2013   BILITOT 0.4 09/11/2013      Microbiology: Recent Results (from the past 240 hour(s))  CULTURE, BLOOD (ROUTINE X 2)     Status: None   Collection Time    09/13/13  7:25 AM      Result Value Ref Range Status   Specimen Description BLOOD LEFT ARM   Final   Special Requests BOTTLES DRAWN AEROBIC ONLY 1 CC   Final   Culture  Setup Time     Final   Value: 09/13/2013 12:01     Performed at [facility]   Culture     Final   Value: NO GROWTH 5 DAYS     Performed at [facility]   Report Status 09/19/2013 FINAL   Final  CULTURE, BLOOD (ROUTINE X 2)     Status: None   Collection Time    09/13/13  8:29 AM      Result Value Ref Range Status   Specimen Description BLOOD LEFT AC   Final   Special Requests BOTTLES DRAWN AEROBIC AND ANAEROBIC 6 CC   Final   Culture  Setup Time     Final   Value: 09/13/2013 12:01     Performed at [facility]   Culture     Final   Value: NO  GROWTH 5 DAYS     Performed at [facility]   Report Status 09/19/2013 FINAL   Final  URINE CULTURE     Status: None   Collection Time    09/13/13 10:20 AM      Result Value Ref Range Status   Specimen Description URINE, CATHETERIZED   Final   Special Requests NONE   Final   Culture  Setup Time     Final   Value: 09/13/2013 12:30     Performed at [facility] Count     Final   Value: NO GROWTH     Performed at [facility]   Culture     Final   Value: NO GROWTH     Performed at [facility]   Report Status 09/14/2013 FINAL   Final    Studies/Results: Mr Heel Right Wo Contrast  09/19/2013   CLINICAL DATA:  RIGHT heel osteomyelitis.  EXAM:  MR OF THE RIGHT HEEL WITHOUT CONTRAST  TECHNIQUE: Multiplanar, multisequence MR imaging was performed. No intravenous contrast was administered.  COMPARISON:  None.  FINDINGS: Patient was uncooperative with scanning and refused to continue imaging. 4 imaging sequences were obtained extending from the distal leg through the midfoot.  There is an ulcer over the lateral aspect of the calcaneal tuberosity with cortical osteolysis and bone marrow edema. This is just inferior to the insertion of the Achilles tendon and consistent with osteomyelitis. No soft tissue abscess is present.  Diffuse edema is present. This is compatible with cellulitis in the appropriate clinical setting. Medial and lateral ankle ligaments appear intact. Achilles tendon intact.  IMPRESSION: Lateral posterior calcaneal tuberosity osteomyelitis.   Electronically Signed   By: Dereck Ligas M.D.   On: 09/19/2013 13:59    Assessment/Plan: 1) C diff - on appropriate therapy.  WBC down a bit.  Does not report significant diarrhea and no I and O recorded.  Will plan on 6 weeks of oral vancomycin.  PR vanco and flagyl while here in hospital.   2) chronic osteomyelitis - new findings of osteo on MRI at site of ulcer.   This certainly complicates #1.  I  would hold any additional antibiotics for now and consider debridement and bone biopsy by orthopedics or IR guided bone biopsy.  If we are able to get an organism, we can give more narrow antibiotics.  She certainly is at risk of a multitude of organisms.    Scharlene Gloss, [facility] for Infectious Disease [place] [email or web address] [phone] pager   [phone] cell 09/19/2013, 3:37 PM

## 2013-09-19 NOTE — Progress Notes (Signed)
General Surgery:  Getting MRI of heel.  Grumpy but in no distress. Abdominal exam benign.  Agree with assessment and plan as outlined I. Ms. Oneida Arenas, NP.  Renee Donovan. Dalbert Batman, M.D., [facility] General and Minimally invasive Surgery Breast and Colorectal Surgery Office:   [phone] Pager:   [phone]

## 2013-09-19 NOTE — Progress Notes (Signed)
[facility] PROGRESS NOTE  Renee Donovan VBT:[id number] DOB: 28-Dec-1930 DOA: 09/04/2013 PCP: [name], MD Interim summary: 80 year old female with past medical history of COPD on 2 L home oxygen, osteoarthritis, from SNF who presented to [facility] ED 09/04/2013 with worsening nausea, vomiting, diarrhea and associated lower abdominal pain for past 5 days prior to this admission. On admission, patient was found to have possible perforated appendicitis for which surgery was consulted. In addition, patient had about 10 minute episode of wide complex tachycardia which has resolved with a bolus of amiodarone. She was found to have acute appendicitis, surgery consulted , recommended medical management. She will probably need a follow up with surgery for possible appendectomy.   Assessment/Plan: #1 acute appendicitis Patient with some clinical improvement. Per general surgery repeat CT scan showing improvement of appendicitis. Colitis noted on CT scan. WBC is trending down. Afebrile. Continue oral ciprofloxacin and Flagyl. Patient is tolerating pured diet. General surgeon following and appreciate input and recommendations.  #2 C. difficile colitis Patient noted to have multiple loose stools. WBC trending down. Abdominal pain improved. Continue vancomycin enema day #11/14. Continue oral Flagyl day #13/14. Will add fluorastor. Due to continued multiple loose stools and leukocytosis, we have consulted with ID for further recommendations on antibiotic management. Recommended to stop ciprofloxacin and continue with oral vancomycin for a total of 6 weeks. To stop rectal vancomycin and flagyl after finishing up the 14 day course.   #3 chronic diastolic CHF Stable. Grade 2 diastolic dysfunction noted on 2-D echo of 09/05/2013 with a preserved EF. Continue beta blocker, imdur, hydralazine. Per cardiology.  #4 wide complex tachycardia Episode noted on the night of 09/08/2013. Patient has been started on  amiodarone per cardiology. Amiodarone dose has been changed to 200 mg daily for the next 8 weeks and then DC per cardiology. Continue beta blocker. Outpatient followup with Dr. Percival Spanish of cardiology as outpatient.  #5 COPD with emphysema Stable. Continue Spiriva.  #6 acute on chronic kidney disease stage IV Baseline creatinine approximately 1.8. On admission was 3.05 in the setting of acute perforated appendicitis and also in the setting of ACE inhibitor. ACE inhibitor has been held. Renal function stable and currently at 1.50. Stable. Monitor fluid status.  #7 hypokalemia/hypomagnesemia Likely secondary to GI losses from C. difficile colitis. Repleted.  #8 anemia Likely dilutional in nature. H&H stable. Follow.  #9 hypertension Continue Lopressor, imdur, hydralazine, clonidine patch.  #10 adnexal cystic lesions Will need outpatient abdominal ultrasound followup.  #11 leukocytosis Questionable etiology. Likely secondary to C. difficile colitis. Chest x-ray shows new left lung base opacity most likely combination of small effusion with either infiltrate or atelectasis. Patient compelted 10 day of IV Zosyn and has no respiratory symptoms and unlikely to be a pneumonia. Urine cultures negative. Continue vancomycin enema, oral Flagyl, Follow.  #12 prophylaxis SCDs for DVT prophylaxis.  #13 Decubitus right heel ulcer: MRI of the heel ordered. ABI of the lower extremities ordered. Wound care consulted.   Code Status: DO NOT RESUSCITATE Family Communication: Updated patient no family at bedside. Disposition Plan:SNF when medically stable.   Consultants:  Cardiology: Dr. Elias Else 09/05/2013   General surgery: Dr. Excell Seltzer 09/05/2013  Procedures: Ct Abdomen Pelvis Wo Contras 09/05/2013 IMPRESSION: Probable acute versus possibly early perforated appendicitis as evidenced by 2.9 x 2.3 cm inflammatory mass adjacent to the cecum. No abscess or pneumoperitoneum. Low-grade partial small  bowel obstruction or, ileus from adjacent appendiceal process. Bilaterals cystic adnexal masses, smaller on the left which  would be atypical for neoplasm. Recommend follow-up ultrasound for further characterization. Small right pleural effusion with right lower lobe airspace opacity which may reflect atelectasis, less likely early pneumonia. Severe aortic atherosclerosis was similar multi segmental ectasia. Dg Chest Portable 1 View 09/04/2013 IMPRESSION: No acute abnormality. Probable emphysema  CT abdomen and pelvis without contrast 09/10/2013 IMPRESSION:  Enlarged thickened appendix with mild periappendiceal infiltrative  changes most likely representing a acute appendicitis.  No definite evidence of perforation or abscess.  Bowel wall thickening extending from the rectum to the distal  transverse colon raising question of colitis new since previous  exam.  Cholelithiasis.  Small abdominal aortic aneurysms.  BILATERAL ovarian cysts up to 4.8 cm in size ; recommend followup  nonemergent sonographic characterization.  Chest x-ray 09/04/2013 2-D echo 09/05/2013   Antibiotics: IV Zosyn 09/04/2013 -->09/15/13  IV Flagyl 09/05/2013 --> 09/13/13 Vancomycin PO 6/14 -->09/13/13 Vancomycin enema 09/13/13 Oral Flagyl 09/13/13 Oral cipro 09/15/13   HPI/Subjective:  No nausea, no vomiting. Has rectaltube.   Objective: Filed Vitals:   09/19/13 1424  BP: 141/69  Pulse: 66  Temp: 97.9 F (36.6 C)  Resp: 20    Intake/Output Summary (Last 24 hours) at 09/19/13 1708 Last data filed at 09/19/13 1300  Gross per 24 hour  Intake    490 ml  Output      0 ml  Net    490 ml   Filed Weights   09/16/13 0542 09/18/13 0630 09/19/13 0636  Weight: 75.8 kg (167 lb 1.7 oz) 80 kg (176 lb 5.9 oz) 80.4 kg (177 lb 4 oz)    Exam:   General:  NAD  Cardiovascular: RRR  Respiratory: CTAB  Abdomen: Soft/ND/ min-mild TTP/+BS  Musculoskeletal: right heel ulcer  Data Reviewed: Basic Metabolic  Panel:  Recent Labs Lab 09/14/13 0326 09/15/13 0340 09/16/13 0650 09/17/13 0325 09/18/13 0504  NA 143 140 142 141 141  K 4.7 3.9 3.7 3.8 3.8  CL 115* 108 106 105 105  CO2 14* 19 26 27 27   GLUCOSE 75 99 93 76 76  BUN 13 14 14 14 14   CREATININE 1.57* 1.60* 1.49* 1.50* 1.48*  CALCIUM 7.6* 7.2* 7.0* 7.4* 7.5*  MG  --   --  1.5 1.5 1.7   Liver Function Tests: No results found for this basename: AST, ALT, ALKPHOS, BILITOT, PROT, ALBUMIN,  in the last 168 hours No results found for this basename: LIPASE, AMYLASE,  in the last 168 hours No results found for this basename: AMMONIA,  in the last 168 hours CBC:  Recent Labs Lab 09/13/13 0335 09/14/13 0326 09/15/13 0340 09/16/13 0650 09/17/13 0325 09/19/13 0400  WBC 21.1* 22.5* 21.8* 17.0* 17.0* 16.5*  NEUTROABS 16.1* 17.6* 17.0* 12.1* 12.7*  --   HGB 11.1* 10.4* 10.2* 10.0* 9.7* 9.5*  HCT 34.2* 31.6* 29.2* 29.4* 29.1* 29.0*  MCV 90.0 90.0 87.7 87.5 89.3 91.8  PLT 344 335 378 373 406* 464*   Cardiac Enzymes: No results found for this basename: CKTOTAL, CKMB, CKMBINDEX, TROPONINI,  in the last 168 hours BNP (last 3 results)  Recent Labs  07/25/13 0308  PROBNP 4353.0*   CBG: No results found for this basename: GLUCAP,  in the last 168 hours  Recent Results (from the past 240 hour(s))  CULTURE, BLOOD (ROUTINE X 2)     Status: None   Collection Time    09/13/13  7:25 AM      Result Value Ref Range Status   Specimen Description BLOOD LEFT ARM  Final   Special Requests BOTTLES DRAWN AEROBIC ONLY 1 CC   Final   Culture  Setup Time     Final   Value: 09/13/2013 12:01     Performed at [facility]   Culture     Final   Value: NO GROWTH 5 DAYS     Performed at [facility]   Report Status 09/19/2013 FINAL   Final  CULTURE, BLOOD (ROUTINE X 2)     Status: None   Collection Time    09/13/13  8:29 AM      Result Value Ref Range Status   Specimen Description BLOOD LEFT AC   Final   Special Requests  BOTTLES DRAWN AEROBIC AND ANAEROBIC 6 CC   Final   Culture  Setup Time     Final   Value: 09/13/2013 12:01     Performed at [facility]   Culture     Final   Value: NO GROWTH 5 DAYS     Performed at [facility]   Report Status 09/19/2013 FINAL   Final  URINE CULTURE     Status: None   Collection Time    09/13/13 10:20 AM      Result Value Ref Range Status   Specimen Description URINE, CATHETERIZED   Final   Special Requests NONE   Final   Culture  Setup Time     Final   Value: 09/13/2013 12:30     Performed at [facility] Count     Final   Value: NO GROWTH     Performed at [facility]   Culture     Final   Value: NO GROWTH     Performed at [facility]   Report Status 09/14/2013 FINAL   Final     Studies: Mr Heel Right Wo Contrast  09/19/2013   CLINICAL DATA:  RIGHT heel osteomyelitis.  EXAM: MR OF THE RIGHT HEEL WITHOUT CONTRAST  TECHNIQUE: Multiplanar, multisequence MR imaging was performed. No intravenous contrast was administered.  COMPARISON:  None.  FINDINGS: Patient was uncooperative with scanning and refused to continue imaging. 4 imaging sequences were obtained extending from the distal leg through the midfoot.  There is an ulcer over the lateral aspect of the calcaneal tuberosity with cortical osteolysis and bone marrow edema. This is just inferior to the insertion of the Achilles tendon and consistent with osteomyelitis. No soft tissue abscess is present.  Diffuse edema is present. This is compatible with cellulitis in the appropriate clinical setting. Medial and lateral ankle ligaments appear intact. Achilles tendon intact.  IMPRESSION: Lateral posterior calcaneal tuberosity osteomyelitis.   Electronically Signed   By: Dereck Ligas M.D.   On: 09/19/2013 13:59    Scheduled Meds: . albuterol  2.5 mg Nebulization BID  . amiodarone  200 mg Oral Daily  . antiseptic oral rinse  15 mL Mouth Rinse q12n4p  . chlorhexidine   15 mL Mouth Rinse BID  . cloNIDine  0.2 mg Transdermal Weekly  . feeding supplement (RESOURCE BREEZE)  1 Container Oral TID BM  . hydrALAZINE  25 mg Oral 3 times per day  . isosorbide mononitrate  120 mg Oral Daily  . magnesium oxide  400 mg Oral BID  . magnesium sulfate 1 - 4 g bolus IVPB  3 g Intravenous Once  . metoprolol tartrate  25 mg Oral BID  . metroNIDAZOLE  500 mg Oral 3 times per day  .  nystatin   Topical TID  . saccharomyces boulardii  250 mg Oral BID  . tiotropium  18 mcg Inhalation Daily  . vancomycin  125 mg Oral 4 times per day  . vancomycin (VANCOCIN) rectal ENEMA  500 mg Rectal 4 times per day   Continuous Infusions: .  sodium bicarbonate  infusion 1000 mL Stopped (09/16/13 1115)    Principal Problem:   Acute perforated appendicitis Active Problems:   COPD (chronic obstructive pulmonary disease) with emphysema   Nausea and vomiting   ARF (acute renal failure)   Diastolic CHF, chronic   Chronic respiratory failure with hypoxia   Ventricular tachycardia, sustained   Protein-calorie malnutrition, severe   Wide-complex tachycardia   Hypokalemia   Hypomagnesemia   Clostridium difficile colitis    Time spent: 6 mins    Percy Winterrowd MD [facility] Pager [phone]. If 7PM-7AM, please contact night-coverage at [email or web address], password [facility] 09/19/2013, 5:08 PM  LOS: 15 days

## 2013-09-19 NOTE — Progress Notes (Signed)
The MRI staff came to take the patient to MRI but the patient was able to remember if she had had a surgery which would have prevented her from going to MRI. The MRI staff stated that they would research to see if patient was cleared for a MRI. If there is clearance given for the MRI then the RN and patient will be notified.

## 2013-09-20 NOTE — Consult Note (Signed)
Renee Donovan is an 80 y.o. female.    Chief Complaint: right heel ulcer  HPI: 80 y/o female with hx of chronic pressure ulcer to right heel for about 2 months per her report. Pt admitted for appendicitis and mri obtained showing osteomyelitis to right heel. Wound care has been consulted and recommends daily dressing changes. Pt is NOT interested in any type of surgery for the heel after discussion. Antibiotic therapy is complicated with recent diagnosis of C.diff.   PCP:  [name], MD  PMH: Past Medical History  Diagnosis Date  . Hyperlipidemia   . Hypertension   . DDD (degenerative disc disease)   . DJD (degenerative joint disease)     ankles  . Cluster headache     remote  . COPD (chronic obstructive pulmonary disease)     Golds Stage II feV1 63% 11/2009  . Thyroid disorder   . Breast cancer 1991    s/p R lumpectomy and radiation     PSH: Past Surgical History  Procedure Laterality Date  . Nose surgery  1993  . Chin lift  [id number]  . Toe surgery  1965    5th toe- right foot  . Breast lumpectomy  1991    Right breast    Social History:  reports that she has quit smoking. Her smoking use included Cigarettes. She has a 30 pack-year smoking history. She has never used smokeless tobacco. She reports that she drinks alcohol. She reports that she does not use illicit drugs.  Allergies:  No Known Allergies  Medications: Current Facility-Administered Medications  Medication Dose Route Frequency Toshiye Kever Last Rate Last Dose  . albuterol (PROVENTIL) (2.5 MG/3ML) 0.083% nebulizer solution 2.5 mg  2.5 mg Nebulization BID Theodis Blaze, MD   2.5 mg at 09/20/13 0829  . amiodarone (PACERONE) tablet 200 mg  200 mg Oral Daily Lelon Perla, MD   200 mg at 09/20/13 1005  . antiseptic oral rinse (BIOTENE) solution 15 mL  15 mL Mouth Rinse q12n4p Phillips Grout, MD   15 mL at 09/20/13 1337  . chlorhexidine (PERIDEX) 0.12 % solution 15 mL  15 mL Mouth Rinse BID Phillips Grout,  MD   15 mL at 09/20/13 0900  . cloNIDine (CATAPRES - Dosed in mg/24 hr) patch 0.2 mg  0.2 mg Transdermal Weekly Phillips Grout, MD   0.2 mg at 09/16/13 1200  . feeding supplement (RESOURCE BREEZE) (RESOURCE BREEZE) liquid 1 Container  1 Container Oral TID BM Hazle Coca, RD   1 Container at 09/20/13 1338  . hydrALAZINE (APRESOLINE) injection 10 mg  10 mg Intravenous Q6H PRN Theodis Blaze, MD   10 mg at 09/09/13 1455  . hydrALAZINE (APRESOLINE) tablet 25 mg  25 mg Oral 3 times per day Theodis Blaze, MD   25 mg at 09/20/13 1337  . HYDROmorphone (DILAUDID) injection 1 mg  1 mg Intravenous Q4H PRN Phillips Grout, MD      . isosorbide mononitrate (IMDUR) 24 hr tablet 120 mg  120 mg Oral Daily Phillips Grout, MD   120 mg at 09/20/13 1006  . levalbuterol (XOPENEX) nebulizer solution 1.25 mg  1.25 mg Nebulization Q6H PRN Theodis Blaze, MD      . magnesium oxide (MAG-OX) tablet 400 mg  400 mg Oral BID Eugenie Filler, MD   400 mg at 09/20/13 1006  . magnesium sulfate 3 g in dextrose 5 % 100 mL IVPB  3 g Intravenous  Once Eugenie Filler, MD      . metoprolol tartrate (LOPRESSOR) tablet 25 mg  25 mg Oral BID Phillips Grout, MD   25 mg at 09/20/13 1006  . nystatin (MYCOSTATIN/NYSTOP) topical powder   Topical TID Eugenie Filler, MD      . ondansetron [facility]) tablet 4 mg  4 mg Oral Q6H PRN Phillips Grout, MD       Or  . ondansetron [facility]) injection 4 mg  4 mg Intravenous Q6H PRN Phillips Grout, MD   4 mg at 09/11/13 1609  . saccharomyces boulardii (FLORASTOR) capsule 250 mg  250 mg Oral BID Adin Hector, MD   250 mg at 09/20/13 1006  . sodium bicarbonate 150 mEq in dextrose 5 % 1,000 mL infusion   Intravenous Continuous Eugenie Filler, MD      . tiotropium [facility]) inhalation capsule 18 mcg  18 mcg Inhalation Daily Phillips Grout, MD   18 mcg at 09/20/13 0831  . vancomycin (VANCOCIN) 50 mg/mL oral solution 125 mg  125 mg Oral 4 times per day Campbell Riches, MD   125 mg at 09/20/13  1300    Results for orders placed during the hospital encounter of 09/04/13 (from the past 48 hour(s))  CBC     Status: Abnormal   Collection Time    09/19/13  4:00 AM      Result Value Ref Range   WBC 16.5 (*) 4.0 - 10.5 K/uL   RBC 3.16 (*) 3.87 - 5.11 MIL/uL   Hemoglobin 9.5 (*) 12.0 - 15.0 g/dL   HCT 29.0 (*) 36.0 - 46.0 %   MCV 91.8  78.0 - 100.0 fL   MCH 30.1  26.0 - 34.0 pg   MCHC 32.8  30.0 - 36.0 g/dL   RDW 16.3 (*) 11.5 - 15.5 %   Platelets 464 (*) 150 - 400 K/uL   Mr Heel Right Wo Contrast  09/19/2013   CLINICAL DATA:  RIGHT heel osteomyelitis.  EXAM: MR OF THE RIGHT HEEL WITHOUT CONTRAST  TECHNIQUE: Multiplanar, multisequence MR imaging was performed. No intravenous contrast was administered.  COMPARISON:  None.  FINDINGS: Patient was uncooperative with scanning and refused to continue imaging. 4 imaging sequences were obtained extending from the distal leg through the midfoot.  There is an ulcer over the lateral aspect of the calcaneal tuberosity with cortical osteolysis and bone marrow edema. This is just inferior to the insertion of the Achilles tendon and consistent with osteomyelitis. No soft tissue abscess is present.  Diffuse edema is present. This is compatible with cellulitis in the appropriate clinical setting. Medial and lateral ankle ligaments appear intact. Achilles tendon intact.  IMPRESSION: Lateral posterior calcaneal tuberosity osteomyelitis.   Electronically Signed   By: Dereck Ligas M.D.   On: 09/19/2013 13:59    ROS: ROS Pt essentially bed ridden with very little ambulation Hx of heel ulcers to bilateral heels due to pressure  Physical Exam: Alert and appropriate 80 y/o female in no acute distress Right heel: stage III ulcer to lateral calcaneus  Very minimal drainage, no erythema, no exposed bone nv intact distally Hammer toe of 2nd digit of right foot with possible early decreased blood flow No other rashes or edema noted Pt bed ridden  Physical  Exam   Assessment/Plan Assessment: osteomyelitis right heel  Plan: Normal treatment would consist of IV antibiotics for at least 6 weeks but currently treatment plan complicated with concurrent C.diff. Pt is  NOT interested in any type of surgery when discussed with patient. Discussed that is condition worsens then only option would be below knee amputation and pt is not interested in discussing that option. Recommend treatment of c.diff and once appropriate return to antibiotics to treat osteo in the heel. Continue daily dressing changes and heel support to reduce the amount of pressure.  Patient was in agreement to this plan. Will discuss case with Dr. Rolena Infante for further management.

## 2013-09-20 NOTE — Progress Notes (Signed)
CSW received call from [facility] that they have been trying to reach patient's grandson, Jenny Reichmann re: bed hold - that if they do not receive a call back or a check from him today, that they would no longer be able to hold the bed for patient. CSW left message for patient's son, Jenny Reichmann (ph#: [phone]) to make him aware & to encourage him to touch base with them. CSW faxed patient's information out other SNFs in [place] is unable to take patient back when ready. CSW will await call back from grandson & anticipating discharge next week.  Clinical Social Work Department CLINICAL SOCIAL WORK PLACEMENT NOTE 09/20/2013  Patient:  Renee Donovan, Renee Donovan  Account Number:  [id number] Admit date:  09/04/2013  Clinical Social Worker:  Renold Genta  Date/time:  09/20/2013 12:04 PM  Clinical Social Work is seeking post-discharge placement for this patient at the following level of care:   SKILLED NURSING   (*CSW will update this form in Epic as items are completed)   09/20/2013  Patient/family provided with [place] Department of Clinical Social Work's list of facilities offering this level of care within the geographic area requested by the patient (or if unable, by the patient's family).  09/20/2013  Patient/family informed of their freedom to choose among providers that offer the needed level of care, that participate in Medicare, Medicaid or managed care program needed by the patient, have an available bed and are willing to accept the patient.  09/20/2013  Patient/family informed of [facility]' ownership interest in [facility], as well as of the fact that they are under no obligation to receive care at this facility.  PASARR submitted to EDS on 09/20/2013 PASARR number received on 09/20/2013  FL2 transmitted to all facilities in geographic area requested by pt/family on  09/20/2013 FL2 transmitted to all facilities within larger geographic area on    Patient informed that his/her managed care company has contracts with or will negotiate with  certain facilities, including the following:     Patient/family informed of bed offers received:   Patient chooses bed at  Physician recommends and patient chooses bed at    Patient to be transferred to  on   Patient to be transferred to facility by  Patient and family notified of transfer on  Name of family member notified:    The following physician request were entered in Epic:   Additional Comments:   Raynaldo Opitz, [place] Social Worker cell #: [phone]

## 2013-09-20 NOTE — Progress Notes (Signed)
[facility] [place] for Infectious Disease  Date of Admission:  09/04/2013  Antibiotics: vanco PR and PO Flagyl po  Subjective: Feels ok, no abdominal pain  Objective: Temp:  [97.6 F (36.4 C)-98.2 F (36.8 C)] 98.2 F (36.8 C) (06/26 0603) Pulse Rate:  [64-73] 73 (06/26 0603) Resp:  [18-22] 22 (06/26 0603) BP: (141-180)/(46-74) 180/74 mmHg (06/26 0603) SpO2:  [92 %-98 %] 98 % (06/26 0832) Weight:  [160 lb 3.2 oz (72.666 kg)] 160 lb 3.2 oz (72.666 kg) (06/26 0650)  General: awake, alert, nad Skin: no rashes Lungs: CTA B Cor: RRR Abdomen: soft, nt, nd Ext: no edema  Lab Results Lab Results  Component Value Date   WBC 16.5* 09/19/2013   HGB 9.5* 09/19/2013   HCT 29.0* 09/19/2013   MCV 91.8 09/19/2013   PLT 464* 09/19/2013    Lab Results  Component Value Date   CREATININE 1.48* 09/18/2013   BUN 14 09/18/2013   NA 141 09/18/2013   K 3.8 09/18/2013   CL 105 09/18/2013   CO2 27 09/18/2013    Lab Results  Component Value Date   ALT 5 09/11/2013   AST 12 09/11/2013   ALKPHOS 66 09/11/2013   BILITOT 0.4 09/11/2013      Microbiology: Recent Results (from the past 240 hour(s))  CULTURE, BLOOD (ROUTINE X 2)     Status: None   Collection Time    09/13/13  7:25 AM      Result Value Ref Range Status   Specimen Description BLOOD LEFT ARM   Final   Special Requests BOTTLES DRAWN AEROBIC ONLY 1 CC   Final   Culture  Setup Time     Final   Value: 09/13/2013 12:01     Performed at [facility]   Culture     Final   Value: NO GROWTH 5 DAYS     Performed at [facility]   Report Status 09/19/2013 FINAL   Final  CULTURE, BLOOD (ROUTINE X 2)     Status: None   Collection Time    09/13/13  8:29 AM      Result Value Ref Range Status   Specimen Description BLOOD LEFT AC   Final   Special Requests BOTTLES DRAWN AEROBIC AND ANAEROBIC 6 CC   Final   Culture  Setup Time     Final   Value: 09/13/2013 12:01     Performed at [facility]   Culture     Final   Value: NO GROWTH 5 DAYS     Performed at [facility]   Report Status 09/19/2013 FINAL   Final  URINE CULTURE     Status: None   Collection Time    09/13/13 10:20 AM      Result Value Ref Range Status   Specimen Description URINE, CATHETERIZED   Final   Special Requests NONE   Final   Culture  Setup Time     Final   Value: 09/13/2013 12:30     Performed at [facility] Count     Final   Value: NO GROWTH     Performed at [facility]   Culture     Final   Value: NO GROWTH     Performed at [facility]   Report Status 09/14/2013 FINAL   Final    Studies/Results: Mr Heel Right Wo Contrast  09/19/2013   CLINICAL DATA:  RIGHT heel osteomyelitis.  EXAM: MR  OF THE RIGHT HEEL WITHOUT CONTRAST  TECHNIQUE: Multiplanar, multisequence MR imaging was performed. No intravenous contrast was administered.  COMPARISON:  None.  FINDINGS: Patient was uncooperative with scanning and refused to continue imaging. 4 imaging sequences were obtained extending from the distal leg through the midfoot.  There is an ulcer over the lateral aspect of the calcaneal tuberosity with cortical osteolysis and bone marrow edema. This is just inferior to the insertion of the Achilles tendon and consistent with osteomyelitis. No soft tissue abscess is present.  Diffuse edema is present. This is compatible with cellulitis in the appropriate clinical setting. Medial and lateral ankle ligaments appear intact. Achilles tendon intact.  IMPRESSION: Lateral posterior calcaneal tuberosity osteomyelitis.   Electronically Signed   By: Dereck Ligas M.D.   On: 09/19/2013 13:59    Assessment/Plan: 1) C diff - on appropriate therapy.  WBC still up but stable.  Does not report significant diarrhea.  Will plan on 6 weeks of oral vancomycin. Will d/c pr vanco and flagyl with improvement.     2) chronic osteomyelitis - new findings of osteo on MRI at site of ulcer.   This certainly complicates #1.  I  would hold any additional antibiotics for now and should consider debridement and bone biopsy by orthopedics or IR guided bone biopsy.  If we are able to get an organism, we can give more narrow antibiotics.  She certainly is at risk of a multitude of organisms.  ABIs have been ordered and were done in April with moderate disease, particularly on right.  Not sure how aggressive patient wants to be or what options are.    Dr. Megan Salon will be available over the weekend if needed, otherwise Dr. Tommy Medal will follow up on Monday.  Thanks  Scharlene Gloss, [place] for Infectious Disease [place] [email or web address] [phone] pager   [phone] cell 09/20/2013, 9:55 AM

## 2013-09-20 NOTE — Consult Note (Addendum)
Patient has been in hospital for 18 days CTSP today for question of Osteo. No evidence of septic process Agree with above Recommend wound care evaluation Set up f/u in [facility] - Dr [name] Will sign off- please call with any further questions  Also patient seen by [facility] - recommend re-consulting them if other issues arise

## 2013-09-20 NOTE — Progress Notes (Signed)
Interviewed and examined. Abdomen soft and benign. Still has leukocytosis. Antibiotic treatment for appendicitis completed. Continues C.Difficile treatment per ID recommendations. Evaluation for osteomyelitis of right calcaneous underway.  Upon discharge followup with Dr. Zella Richer in 6 weeks or so.   Edsel Petrin. Dalbert Batman, M.D., [facility] General and Minimally invasive Surgery Breast and Colorectal Surgery

## 2013-09-20 NOTE — Progress Notes (Signed)
[facility] PROGRESS NOTE  Renee Donovan TKP:[id number] DOB: 1930-04-02 DOA: 09/04/2013 PCP: [name], MD Interim summary: 80 year old female with past medical history of COPD on 2 L home oxygen, osteoarthritis, from SNF who presented to [facility] ED 09/04/2013 with worsening nausea, vomiting, diarrhea and associated lower abdominal pain for past 5 days prior to this admission. On admission, patient was found to have possible perforated appendicitis for which surgery was consulted. In addition, patient had about 10 minute episode of wide complex tachycardia which has resolved with a bolus of amiodarone. She was found to have acute appendicitis, surgery consulted , recommended medical management. She will probably need a follow up with surgery for possible appendectomy.   Assessment/Plan: #1 acute appendicitis Patient with some clinical improvement. Per general surgery repeat CT scan showing improvement of appendicitis. Colitis noted on CT scan. WBC is trending down. Afebrile. She has completed the course of oral flagyl and oral ciprofloxacin. Patient is tolerating pured diet. General surgeon following and appreciate input and recommendations.  #2 C. difficile colitis Patient noted to have multiple loose stools. WBC trending down. Abdominal pain improved. Continue vancomycin enema day #12/14. Completed flagyl.  Due to continued multiple loose stools and leukocytosis, we have consulted with ID for further recommendations on antibiotic management. Recommended to stop ciprofloxacin and continue with oral vancomycin for a total of 6 weeks. To stop rectal vancomycin and flagyl after finishing up the 14 day course.   #3 chronic diastolic CHF Stable. Grade 2 diastolic dysfunction noted on 2-D echo of 09/05/2013 with a preserved EF. Continue beta blocker, imdur, hydralazine. Per cardiology.  #4 wide complex tachycardia Episode noted on the night of 09/08/2013. Patient has been started on amiodarone  per cardiology. Amiodarone dose has been changed to 200 mg daily for the next 8 weeks and then DC per cardiology. Continue beta blocker. Outpatient followup with Dr. Percival Spanish of cardiology as outpatient.  #5 COPD with emphysema Stable. Continue Spiriva.  #6 acute on chronic kidney disease stage IV Baseline creatinine approximately 1.8. On admission was 3.05 in the setting of acute perforated appendicitis and also in the setting of ACE inhibitor. ACE inhibitor has been held. Renal function stable and currently at 1.50. Stable. Monitor fluid status.  #7 hypokalemia/hypomagnesemia Likely secondary to GI losses from C. difficile colitis. Repleted.  #8 anemia Likely dilutional in nature. H&H stable. Follow.  #9 hypertension Continue Lopressor, imdur, hydralazine, clonidine patch.  #10 adnexal cystic lesions Will need outpatient abdominal ultrasound followup.  #11 leukocytosis Questionable etiology. Likely secondary to C. difficile colitis. Chest x-ray shows new left lung base opacity most likely combination of small effusion with either infiltrate or atelectasis. Patient compelted 10 day of IV Zosyn and has no respiratory symptoms and unlikely to be a pneumonia. Urine cultures negative. Continue vancomycin enema, oral Flagyl, Follow.  #12 prophylaxis SCDs for DVT prophylaxis.  #13 Decubitus right heel ulcer: MRI of the heel ordered and showed calcaneal tuberosity osteomyeleitis. ABI of the lower extremities ordered. Wound care consulted. Patient does not want any kind of surgery or intervention on her feet. We will schedule wound care followup .  Code Status: DO NOT RESUSCITATE Family Communication: Updated patient no family at bedside. Disposition Plan:SNF when medically stable.   Consultants:  Cardiology: Dr. Elias Else 09/05/2013   General surgery: Dr. Excell Seltzer 09/05/2013  Procedures: Ct Abdomen Pelvis Wo Contras 09/05/2013 IMPRESSION: Probable acute versus possibly early  perforated appendicitis as evidenced by 2.9 x 2.3 cm inflammatory mass adjacent to the  cecum. No abscess or pneumoperitoneum. Low-grade partial small bowel obstruction or, ileus from adjacent appendiceal process. Bilaterals cystic adnexal masses, smaller on the left which would be atypical for neoplasm. Recommend follow-up ultrasound for further characterization. Small right pleural effusion with right lower lobe airspace opacity which may reflect atelectasis, less likely early pneumonia. Severe aortic atherosclerosis was similar multi segmental ectasia. Dg Chest Portable 1 View 09/04/2013 IMPRESSION: No acute abnormality. Probable emphysema  CT abdomen and pelvis without contrast 09/10/2013 IMPRESSION:  Enlarged thickened appendix with mild periappendiceal infiltrative  changes most likely representing a acute appendicitis.  No definite evidence of perforation or abscess.  Bowel wall thickening extending from the rectum to the distal  transverse colon raising question of colitis new since previous  exam.  Cholelithiasis.  Small abdominal aortic aneurysms.  BILATERAL ovarian cysts up to 4.8 cm in size ; recommend followup  nonemergent sonographic characterization.  Chest x-ray 09/04/2013 2-D echo 09/05/2013   Antibiotics: IV Zosyn 09/04/2013 -->09/15/13  IV Flagyl 09/05/2013 --> 09/13/13 Vancomycin PO 6/14 -->09/13/13 Vancomycin enema 09/13/13 Oral Flagyl 09/13/13 Oral cipro 09/15/13   HPI/Subjective:  No nausea, no vomiting. Has rectaltube.  She is comfortable. No family at bedside.   Objective: Filed Vitals:   09/20/13 1315  BP: 143/63  Pulse: 61  Temp: 97.9 F (36.6 C)  Resp: 20    Intake/Output Summary (Last 24 hours) at 09/20/13 1811 Last data filed at 09/20/13 1500  Gross per 24 hour  Intake    240 ml  Output    400 ml  Net   -160 ml   Filed Weights   09/18/13 0630 09/19/13 0636 09/20/13 0650  Weight: 80 kg (176 lb 5.9 oz) 80.4 kg (177 lb 4 oz) 72.666 kg (160 lb 3.2  oz)    Exam:   General:  NAD  Cardiovascular: RRR  Respiratory: CTAB  Abdomen: Soft/ND/ min-mild TTP/+BS  Musculoskeletal: right heel ulcer  Data Reviewed: Basic Metabolic Panel:  Recent Labs Lab 09/14/13 0326 09/15/13 0340 09/16/13 0650 09/17/13 0325 09/18/13 0504  NA 143 140 142 141 141  K 4.7 3.9 3.7 3.8 3.8  CL 115* 108 106 105 105  CO2 14* 19 26 27 27   GLUCOSE 75 99 93 76 76  BUN 13 14 14 14 14   CREATININE 1.57* 1.60* 1.49* 1.50* 1.48*  CALCIUM 7.6* 7.2* 7.0* 7.4* 7.5*  MG  --   --  1.5 1.5 1.7   Liver Function Tests: No results found for this basename: AST, ALT, ALKPHOS, BILITOT, PROT, ALBUMIN,  in the last 168 hours No results found for this basename: LIPASE, AMYLASE,  in the last 168 hours No results found for this basename: AMMONIA,  in the last 168 hours CBC:  Recent Labs Lab 09/14/13 0326 09/15/13 0340 09/16/13 0650 09/17/13 0325 09/19/13 0400  WBC 22.5* 21.8* 17.0* 17.0* 16.5*  NEUTROABS 17.6* 17.0* 12.1* 12.7*  --   HGB 10.4* 10.2* 10.0* 9.7* 9.5*  HCT 31.6* 29.2* 29.4* 29.1* 29.0*  MCV 90.0 87.7 87.5 89.3 91.8  PLT 335 378 373 406* 464*   Cardiac Enzymes: No results found for this basename: CKTOTAL, CKMB, CKMBINDEX, TROPONINI,  in the last 168 hours BNP (last 3 results)  Recent Labs  07/25/13 0308  PROBNP 4353.0*   CBG: No results found for this basename: GLUCAP,  in the last 168 hours  Recent Results (from the past 240 hour(s))  CULTURE, BLOOD (ROUTINE X 2)     Status: None   Collection Time  09/13/13  7:25 AM      Result Value Ref Range Status   Specimen Description BLOOD LEFT ARM   Final   Special Requests BOTTLES DRAWN AEROBIC ONLY 1 CC   Final   Culture  Setup Time     Final   Value: 09/13/2013 12:01     Performed at [facility]   Culture     Final   Value: NO GROWTH 5 DAYS     Performed at [facility]   Report Status 09/19/2013 FINAL   Final  CULTURE, BLOOD (ROUTINE X 2)     Status: None    Collection Time    09/13/13  8:29 AM      Result Value Ref Range Status   Specimen Description BLOOD LEFT AC   Final   Special Requests BOTTLES DRAWN AEROBIC AND ANAEROBIC 6 CC   Final   Culture  Setup Time     Final   Value: 09/13/2013 12:01     Performed at [facility]   Culture     Final   Value: NO GROWTH 5 DAYS     Performed at [facility]   Report Status 09/19/2013 FINAL   Final  URINE CULTURE     Status: None   Collection Time    09/13/13 10:20 AM      Result Value Ref Range Status   Specimen Description URINE, CATHETERIZED   Final   Special Requests NONE   Final   Culture  Setup Time     Final   Value: 09/13/2013 12:30     Performed at [facility] Count     Final   Value: NO GROWTH     Performed at [facility]   Culture     Final   Value: NO GROWTH     Performed at [facility]   Report Status 09/14/2013 FINAL   Final     Studies: Mr Heel Right Wo Contrast  09/19/2013   CLINICAL DATA:  RIGHT heel osteomyelitis.  EXAM: MR OF THE RIGHT HEEL WITHOUT CONTRAST  TECHNIQUE: Multiplanar, multisequence MR imaging was performed. No intravenous contrast was administered.  COMPARISON:  None.  FINDINGS: Patient was uncooperative with scanning and refused to continue imaging. 4 imaging sequences were obtained extending from the distal leg through the midfoot.  There is an ulcer over the lateral aspect of the calcaneal tuberosity with cortical osteolysis and bone marrow edema. This is just inferior to the insertion of the Achilles tendon and consistent with osteomyelitis. No soft tissue abscess is present.  Diffuse edema is present. This is compatible with cellulitis in the appropriate clinical setting. Medial and lateral ankle ligaments appear intact. Achilles tendon intact.  IMPRESSION: Lateral posterior calcaneal tuberosity osteomyelitis.   Electronically Signed   By: Dereck Ligas M.D.   On: 09/19/2013 13:59    Scheduled Meds: .  albuterol  2.5 mg Nebulization BID  . amiodarone  200 mg Oral Daily  . antiseptic oral rinse  15 mL Mouth Rinse q12n4p  . chlorhexidine  15 mL Mouth Rinse BID  . cloNIDine  0.2 mg Transdermal Weekly  . feeding supplement (RESOURCE BREEZE)  1 Container Oral TID BM  . hydrALAZINE  25 mg Oral 3 times per day  . isosorbide mononitrate  120 mg Oral Daily  . magnesium oxide  400 mg Oral BID  . magnesium sulfate 1 - 4 g bolus IVPB  3 g Intravenous  Once  . metoprolol tartrate  25 mg Oral BID  . nystatin   Topical TID  . saccharomyces boulardii  250 mg Oral BID  . tiotropium  18 mcg Inhalation Daily  . vancomycin  125 mg Oral 4 times per day   Continuous Infusions: .  sodium bicarbonate  infusion 1000 mL Stopped (09/16/13 1115)    Principal Problem:   Acute perforated appendicitis Active Problems:   COPD (chronic obstructive pulmonary disease) with emphysema   Nausea and vomiting   ARF (acute renal failure)   Diastolic CHF, chronic   Chronic respiratory failure with hypoxia   Ventricular tachycardia, sustained   Protein-calorie malnutrition, severe   Wide-complex tachycardia   Hypokalemia   Hypomagnesemia   Clostridium difficile colitis    Time spent: 76 mins    AKULA,VIJAYA MD [facility] Pager [phone]. If 7PM-7AM, please contact night-coverage at [email or web address], password [facility] 09/20/2013, 6:11 PM  LOS: 16 days

## 2013-09-20 NOTE — Progress Notes (Signed)
Patient ID: Renee Donovan, female   DOB: [birth year], 80 y.o.   MRN: [id number]  Subjective: Rectal tube in place-liquid stool.  WBC slowly trending down, now has presumed osteo of foot which complicates things.  Tolerating diet.    Objective:  Vital signs:  Filed Vitals:   09/19/13 1424 09/19/13 2118 09/20/13 0603 09/20/13 0650  BP: 141/69 149/46 180/74   Pulse: 66 64 73   Temp: 97.9 F (36.6 C) 97.6 F (36.4 C) 98.2 F (36.8 C)   TempSrc: Oral Oral Oral   Resp: 20 18 22    Height:      Weight:    160 lb 3.2 oz (72.666 kg)  SpO2: 94% 92% 98%     Last BM Date: 09/18/13  Intake/Output   Yesterday:  06/25 0701 - 06/26 0700 In: 240 [P.O.:240] Out: -  This shift:    I/O last 3 completed shifts: In: 6 [P.O.:480; Other:10] Out: -     Physical Exam:  General: Pt awake/alert/oriented x4 in no acute distress  Abdomen: Soft. Nondistended. Minimal ttp to RLQ, mild ttp left abdomen. No evidence of peritonitis. No incarcerated hernias.    Problem List:   Principal Problem:   Acute perforated appendicitis Active Problems:   COPD (chronic obstructive pulmonary disease) with emphysema   Nausea and vomiting   ARF (acute renal failure)   Diastolic CHF, chronic   Chronic respiratory failure with hypoxia   Ventricular tachycardia, sustained   Protein-calorie malnutrition, severe   Wide-complex tachycardia   Hypokalemia   Hypomagnesemia   Clostridium difficile colitis    Results:   Labs: Results for orders placed during the hospital encounter of 09/04/13 (from the past 48 hour(s))  CBC     Status: Abnormal   Collection Time    09/19/13  4:00 AM      Result Value Ref Range   WBC 16.5 (*) 4.0 - 10.5 K/uL   RBC 3.16 (*) 3.87 - 5.11 MIL/uL   Hemoglobin 9.5 (*) 12.0 - 15.0 g/dL   HCT 29.0 (*) 36.0 - 46.0 %   MCV 91.8  78.0 - 100.0 fL   MCH 30.1  26.0 - 34.0 pg   MCHC 32.8  30.0 - 36.0 g/dL   RDW 16.3 (*) 11.5 - 15.5 %   Platelets 464 (*) 150 - 400 K/uL     Imaging / Studies: Mr Heel Right Wo Contrast  09/19/2013   CLINICAL DATA:  RIGHT heel osteomyelitis.  EXAM: MR OF THE RIGHT HEEL WITHOUT CONTRAST  TECHNIQUE: Multiplanar, multisequence MR imaging was performed. No intravenous contrast was administered.  COMPARISON:  None.  FINDINGS: Patient was uncooperative with scanning and refused to continue imaging. 4 imaging sequences were obtained extending from the distal leg through the midfoot.  There is an ulcer over the lateral aspect of the calcaneal tuberosity with cortical osteolysis and bone marrow edema. This is just inferior to the insertion of the Achilles tendon and consistent with osteomyelitis. No soft tissue abscess is present.  Diffuse edema is present. This is compatible with cellulitis in the appropriate clinical setting. Medial and lateral ankle ligaments appear intact. Achilles tendon intact.  IMPRESSION: Lateral posterior calcaneal tuberosity osteomyelitis.   Electronically Signed   By: Dereck Ligas M.D.   On: 09/19/2013 13:59   Scheduled Meds: . albuterol  2.5 mg Nebulization BID  . amiodarone  200 mg Oral Daily  . antiseptic oral rinse  15 mL Mouth Rinse q12n4p  . chlorhexidine  15 mL Mouth Rinse  BID  . cloNIDine  0.2 mg Transdermal Weekly  . feeding supplement (RESOURCE BREEZE)  1 Container Oral TID BM  . hydrALAZINE  25 mg Oral 3 times per day  . isosorbide mononitrate  120 mg Oral Daily  . magnesium oxide  400 mg Oral BID  . magnesium sulfate 1 - 4 g bolus IVPB  3 g Intravenous Once  . metoprolol tartrate  25 mg Oral BID  . metroNIDAZOLE  500 mg Oral 3 times per day  . nystatin   Topical TID  . saccharomyces boulardii  250 mg Oral BID  . tiotropium  18 mcg Inhalation Daily  . vancomycin  125 mg Oral 4 times per day  . vancomycin (VANCOCIN) rectal ENEMA  500 mg Rectal 4 times per day   Continuous Infusions: .  sodium bicarbonate  infusion 1000 mL Stopped (09/16/13 1115)   PRN Meds:.hydrALAZINE, HYDROmorphone  (DILAUDID) injection, levalbuterol, ondansetron (ZOFRAN) IV, ondansetron   Antibiotics: Anti-infectives   Start     Dose/Rate Route Frequency Ordered Stop   09/18/13 1000  fluconazole (DIFLUCAN) tablet 100 mg  Status:  Discontinued     100 mg Oral Daily 09/17/13 1607 09/17/13 1628   09/17/13 1615  fluconazole (DIFLUCAN) tablet 200 mg  Status:  Discontinued     200 mg Oral  Once 09/17/13 1606 09/17/13 1628   09/17/13 1230  vancomycin (VANCOCIN) 50 mg/mL oral solution 125 mg     125 mg Oral 4 times per day 09/17/13 1205     09/15/13 2000  ciprofloxacin (CIPRO) tablet 500 mg     500 mg Oral 2 times daily 09/15/13 1356 09/18/13 2043   09/13/13 1400  metroNIDAZOLE (FLAGYL) tablet 500 mg     500 mg Oral 3 times per day 09/13/13 0931     09/13/13 1200  vancomycin (VANCOCIN) 500 mg in sodium chloride irrigation 0.9 % 100 mL ENEMA     500 mg Rectal 4 times per day 09/13/13 0842     09/08/13 1400  piperacillin-tazobactam (ZOSYN) IVPB 3.375 g  Status:  Discontinued     3.375 g 12.5 mL/hr over 240 Minutes Intravenous Every 8 hours 09/08/13 0915 09/15/13 1356   09/08/13 1200  vancomycin (VANCOCIN) 50 mg/mL oral solution 250 mg  Status:  Discontinued     250 mg Oral 4 times per day 09/08/13 1016 09/13/13 0842   09/05/13 1000  metroNIDAZOLE (FLAGYL) IVPB 500 mg  Status:  Discontinued     500 mg 100 mL/hr over 60 Minutes Intravenous 3 times per day 09/05/13 0834 09/13/13 0930   09/05/13 0930  metroNIDAZOLE (FLAGYL) IVPB 500 mg  Status:  Discontinued     500 mg 100 mL/hr over 60 Minutes Intravenous Every 8 hours 09/05/13 0927 09/05/13 0939   09/05/13 0800  piperacillin-tazobactam (ZOSYN) IVPB 2.25 g  Status:  Discontinued     2.25 g 100 mL/hr over 30 Minutes Intravenous Every 6 hours 09/05/13 0213 09/08/13 0915   09/05/13 0030  piperacillin-tazobactam (ZOSYN) IVPB 3.375 g     3.375 g 100 mL/hr over 30 Minutes Intravenous  Once 09/05/13 0028 09/05/13 0128      Assessment/Plan  Acute perforated  appendicitis  c diff colitis  -appreciate ID recommendations  -zosyn #10 days, then cipro D#4---coarse completed  -continue with pureed diet  -we will consider an interval appendectomy in the future, depending on her physical status  -further management per primary team.  -will follow  Presumed osteomyelitis of the foot-per primary team  [facility]  Lenward Able, [facility] Surgery Pager [phone] Office [phone]  09/20/2013 8:23 AM

## 2013-09-20 NOTE — Progress Notes (Addendum)
Occupational Therapy Treatment Patient Details Name: Renee Donovan MRN: [id number] DOB: 12-Mar-1931 Today's Date: 09/20/2013    History of present illness Pt is an 80 year old female admitted 4/23 for nausea and vomiting with history of COPD, hypothyroidism, chronic alcoholism, tobacco abuse, history of breast cancer, hypertension.  Pt also with toe ulcers suspicious for PVD per chart review.  Pt usually on 2L O2 at home and currently on 2L O2 in room. Per chart 09/20/13 pt now has presumed osteo of R foot.    OT comments  Pt requires encouragement to complete one set of 10 reps of each UE bilateral exercises. She frequently states, "ok that is enough" before she has completed a set of 10. Encouragement provided to complete a full set of 10 of each exercise. Note per chart, pt with osteomyelitis of R foot now.   Follow Up Recommendations       Equipment Recommendations       Recommendations for Other Services      Precautions / Restrictions Precautions Precautions: Fall       Mobility Bed Mobility                  Transfers                      Balance                                   ADL                                         General ADL Comments: Pt did not have UEs on pillows but verbalized understanding to try to have UEs on pillows for edema control. OT assisted pt to have pillows under each UE. First time for this therapist to see pt but appears digits and hands ok as far as no edema. Explained to pt how exercises will help her with UEs strengthening so she can help wtih bed mobility, feeding herself, etc. Pt verbalized understanding.       Vision                     Perception     Praxis      Cognition                             Extremity/Trunk Assessment               Exercises Other Exercises Other Exercises: pt agreeable to UE exercises but then appears to be in a hurry to  complete them and be done with session. Pt states after a few reps, "ok that is enough." Needed encouragement to continue until 10 reps of each. Shoulder, elbow, wrist, digit flexion/extension X 10 each. Supervision and mod verbal cues for technique and encouragement.   Shoulder Instructions       General Comments      Pertinent Vitals/ Pain       No complaint of  Home Living                                          Prior Functioning/Environment  Frequency       Progress Toward Goals  OT Goals(current goals can now be found in the care plan section)        Plan      Co-evaluation                 End of Session     Activity Tolerance     Patient Left     Nurse Communication          Time: 2130-8657 OT Time Calculation (min): 9 min  Charges: OT General Charges $OT Visit: 1 Procedure OT Treatments $Therapeutic Exercise: 8-22 mins  Jules Schick [phone] 09/20/2013, 10:45 AM

## 2013-09-21 MED ORDER — COLLAGENASE 250 UNIT/GM EX OINT
TOPICAL_OINTMENT | CUTANEOUS | Status: DC
  Administered 2013-09-21 – 2013-09-22 (×3): via TOPICAL
  Administered 2013-09-23: 1 via TOPICAL
  Filled 2013-09-21: qty 30

## 2013-09-21 NOTE — Progress Notes (Signed)
[facility] PROGRESS NOTE  Renee Donovan LOV:[id number] DOB: 12/15/1930 DOA: 09/04/2013 PCP: [name], MD Interim summary: 80 year old female with past medical history of COPD on 2 L home oxygen, osteoarthritis, from SNF who presented to [facility] ED 09/04/2013 with worsening nausea, vomiting, diarrhea and associated lower abdominal pain for past 5 days prior to this admission. On admission, patient was found to have possible perforated appendicitis for which surgery was consulted. In addition, patient had about 10 minute episode of wide complex tachycardia which has resolved with a bolus of amiodarone. She was found to have acute appendicitis, surgery consulted , recommended medical management. She will probably need a follow up with surgery for possible appendectomy.   Assessment/Plan: #1 acute appendicitis Patient with some clinical improvement. Per general surgery repeat CT scan showing improvement of appendicitis. Colitis noted on CT scan. WBC is trending down. Afebrile. She has completed the course of oral flagyl and oral ciprofloxacin. Patient is tolerating pured diet. General surgeon following and appreciate input and recommendations.  #2 C. difficile colitis Patient noted to have multiple loose stools. WBC trending down. Abdominal pain improved. Continue vancomycin enema day #12/14. Completed flagyl.  Due to continued multiple loose stools and leukocytosis, we have consulted with ID for further recommendations on antibiotic management. Recommended to stop ciprofloxacin and continue with oral vancomycin for a total of 6 weeks. To stop rectal vancomycin and flagyl after finishing up the 14 day course.   #3 chronic diastolic CHF Stable. Grade 2 diastolic dysfunction noted on 2-D echo of 09/05/2013 with a preserved EF. Continue beta blocker, imdur, hydralazine. Per cardiology.  #4 wide complex tachycardia Episode noted on the night of 09/08/2013. Patient has been started on amiodarone  per cardiology. Amiodarone dose has been changed to 200 mg daily for the next 8 weeks and then DC per cardiology. Continue beta blocker. Outpatient followup with Dr. Percival Spanish of cardiology as outpatient.  #5 COPD with emphysema Stable. Continue Spiriva.  #6 acute on chronic kidney disease stage IV Baseline creatinine approximately 1.8. On admission was 3.05 in the setting of acute perforated appendicitis and also in the setting of ACE inhibitor. ACE inhibitor has been held. Renal function stable and currently at 1.50. Stable. Monitor fluid status.  #7 hypokalemia/hypomagnesemia Likely secondary to GI losses from C. difficile colitis. Repleted.  #8 anemia Likely dilutional in nature. H&H stable. Follow.  #9 hypertension Continue Lopressor, imdur, hydralazine, clonidine patch.  #10 adnexal cystic lesions Will need outpatient abdominal ultrasound followup.  #11 leukocytosis Questionable etiology. Likely secondary to C. difficile colitis. Chest x-ray shows new left lung base opacity most likely combination of small effusion with either infiltrate or atelectasis. Patient compelted 10 day of IV Zosyn and has no respiratory symptoms and unlikely to be a pneumonia. Urine cultures negative. Continue vancomycin enema, oral Flagyl, Follow.  #12 prophylaxis SCDs for DVT prophylaxis.  #13 Decubitus right heel ulcer: MRI of the heel ordered and showed calcaneal tuberosity osteomyeleitis. ABI of the lower extremities ordered. Wound care consulted. Patient does not want any kind of surgery or intervention on her feet. We will schedule wound care followup .  Code Status: DO NOT RESUSCITATE Family Communication: Updated patient no family at bedside. Disposition Plan:SNF when medically stable.   Consultants:  Cardiology: Dr. Elias Else 09/05/2013   General surgery: Dr. Excell Seltzer 09/05/2013  Procedures: Ct Abdomen Pelvis Wo Contras 09/05/2013 IMPRESSION: Probable acute versus possibly early  perforated appendicitis as evidenced by 2.9 x 2.3 cm inflammatory mass adjacent to the  cecum. No abscess or pneumoperitoneum. Low-grade partial small bowel obstruction or, ileus from adjacent appendiceal process. Bilaterals cystic adnexal masses, smaller on the left which would be atypical for neoplasm. Recommend follow-up ultrasound for further characterization. Small right pleural effusion with right lower lobe airspace opacity which may reflect atelectasis, less likely early pneumonia. Severe aortic atherosclerosis was similar multi segmental ectasia. Dg Chest Portable 1 View 09/04/2013 IMPRESSION: No acute abnormality. Probable emphysema  CT abdomen and pelvis without contrast 09/10/2013 IMPRESSION:  Enlarged thickened appendix with mild periappendiceal infiltrative  changes most likely representing a acute appendicitis.  No definite evidence of perforation or abscess.  Bowel wall thickening extending from the rectum to the distal  transverse colon raising question of colitis new since previous  exam.  Cholelithiasis.  Small abdominal aortic aneurysms.  BILATERAL ovarian cysts up to 4.8 cm in size ; recommend followup  nonemergent sonographic characterization.  Chest x-ray 09/04/2013 2-D echo 09/05/2013   Antibiotics: IV Zosyn 09/04/2013 -->09/15/13  IV Flagyl 09/05/2013 --> 09/13/13 Vancomycin PO 6/14 -->09/13/13 Vancomycin enema 09/13/13 Oral Flagyl 09/13/13 Oral cipro 09/15/13   HPI/Subjective:  No nausea, no vomiting. Rectal tube has come out yesterday. She had 3 BM so far .  Requested to speak to family members over the phone. She did not want me to talk to the family .  She is comfortable. No family at bedside.   Objective: Filed Vitals:   09/21/13 1452  BP: 141/51  Pulse: 60  Temp: 97.2 F (36.2 C)  Resp: 18    Intake/Output Summary (Last 24 hours) at 09/21/13 1537 Last data filed at 09/21/13 1450  Gross per 24 hour  Intake    120 ml  Output      0 ml  Net    120 ml    Filed Weights   09/19/13 0636 09/20/13 0650 09/21/13 0556  Weight: 80.4 kg (177 lb 4 oz) 72.666 kg (160 lb 3.2 oz) 72.303 kg (159 lb 6.4 oz)    Exam:   General:  NAD  Cardiovascular: RRR  Respiratory: CTAB  Abdomen: Soft/ND/ min-mild TTP/+BS  Musculoskeletal: right heel ulcer  Data Reviewed: Basic Metabolic Panel:  Recent Labs Lab 09/15/13 0340 09/16/13 0650 09/17/13 0325 09/18/13 0504  NA 140 142 141 141  K 3.9 3.7 3.8 3.8  CL 108 106 105 105  CO2 19 26 27 27   GLUCOSE 99 93 76 76  BUN 14 14 14 14   CREATININE 1.60* 1.49* 1.50* 1.48*  CALCIUM 7.2* 7.0* 7.4* 7.5*  MG  --  1.5 1.5 1.7   Liver Function Tests: No results found for this basename: AST, ALT, ALKPHOS, BILITOT, PROT, ALBUMIN,  in the last 168 hours No results found for this basename: LIPASE, AMYLASE,  in the last 168 hours No results found for this basename: AMMONIA,  in the last 168 hours CBC:  Recent Labs Lab 09/15/13 0340 09/16/13 0650 09/17/13 0325 09/19/13 0400  WBC 21.8* 17.0* 17.0* 16.5*  NEUTROABS 17.0* 12.1* 12.7*  --   HGB 10.2* 10.0* 9.7* 9.5*  HCT 29.2* 29.4* 29.1* 29.0*  MCV 87.7 87.5 89.3 91.8  PLT 378 373 406* 464*   Cardiac Enzymes: No results found for this basename: CKTOTAL, CKMB, CKMBINDEX, TROPONINI,  in the last 168 hours BNP (last 3 results)  Recent Labs  07/25/13 0308  PROBNP 4353.0*   CBG: No results found for this basename: GLUCAP,  in the last 168 hours  Recent Results (from the past 240 hour(s))  CULTURE, BLOOD (ROUTINE  X 2)     Status: None   Collection Time    09/13/13  7:25 AM      Result Value Ref Range Status   Specimen Description BLOOD LEFT ARM   Final   Special Requests BOTTLES DRAWN AEROBIC ONLY 1 CC   Final   Culture  Setup Time     Final   Value: 09/13/2013 12:01     Performed at [facility]   Culture     Final   Value: NO GROWTH 5 DAYS     Performed at [facility]   Report Status 09/19/2013 FINAL   Final  CULTURE,  BLOOD (ROUTINE X 2)     Status: None   Collection Time    09/13/13  8:29 AM      Result Value Ref Range Status   Specimen Description BLOOD LEFT AC   Final   Special Requests BOTTLES DRAWN AEROBIC AND ANAEROBIC 6 CC   Final   Culture  Setup Time     Final   Value: 09/13/2013 12:01     Performed at [facility]   Culture     Final   Value: NO GROWTH 5 DAYS     Performed at [facility]   Report Status 09/19/2013 FINAL   Final  URINE CULTURE     Status: None   Collection Time    09/13/13 10:20 AM      Result Value Ref Range Status   Specimen Description URINE, CATHETERIZED   Final   Special Requests NONE   Final   Culture  Setup Time     Final   Value: 09/13/2013 12:30     Performed at [facility] Count     Final   Value: NO GROWTH     Performed at [facility]   Culture     Final   Value: NO GROWTH     Performed at [facility]   Report Status 09/14/2013 FINAL   Final     Studies: No results found.  Scheduled Meds: . albuterol  2.5 mg Nebulization BID  . amiodarone  200 mg Oral Daily  . antiseptic oral rinse  15 mL Mouth Rinse q12n4p  . chlorhexidine  15 mL Mouth Rinse BID  . cloNIDine  0.2 mg Transdermal Weekly  . feeding supplement (RESOURCE BREEZE)  1 Container Oral TID BM  . hydrALAZINE  25 mg Oral 3 times per day  . isosorbide mononitrate  120 mg Oral Daily  . magnesium oxide  400 mg Oral BID  . magnesium sulfate 1 - 4 g bolus IVPB  3 g Intravenous Once  . metoprolol tartrate  25 mg Oral BID  . nystatin   Topical TID  . saccharomyces boulardii  250 mg Oral BID  . tiotropium  18 mcg Inhalation Daily  . vancomycin  125 mg Oral 4 times per day   Continuous Infusions: .  sodium bicarbonate  infusion 1000 mL Stopped (09/16/13 1115)    Principal Problem:   Acute perforated appendicitis Active Problems:   COPD (chronic obstructive pulmonary disease) with emphysema   Nausea and vomiting   ARF (acute renal  failure)   Diastolic CHF, chronic   Chronic respiratory failure with hypoxia   Ventricular tachycardia, sustained   Protein-calorie malnutrition, severe   Wide-complex tachycardia   Hypokalemia   Hypomagnesemia   Clostridium difficile colitis    Time spent: 35 mins  Hosie Poisson MD [facility] Pager [phone]. If 7PM-7AM, please contact night-coverage at [email or web address], password [facility] 09/21/2013, 3:37 PM  LOS: 17 days

## 2013-09-21 NOTE — Progress Notes (Signed)
Subjective: No complaints. Wants to leave hospital  Objective: Vital signs in last 24 hours: Temp:  [97.7 F (36.5 C)-97.9 F (36.6 C)] 97.7 F (36.5 C) (06/27 0556) Pulse Rate:  [58-65] 58 (06/27 0556) Resp:  [18-20] 18 (06/27 0556) BP: (141-146)/(53-63) 146/54 mmHg (06/27 0556) SpO2:  [96 %-100 %] 98 % (06/27 0922) Weight:  [159 lb 6.4 oz (72.303 kg)] 159 lb 6.4 oz (72.303 kg) (06/27 0556) Last BM Date: 09/18/13  Intake/Output from previous day: 06/26 0701 - 06/27 0700 In: 240 [P.O.:240] Out: 400 [Stool:400] Intake/Output this shift:    Resp: clear to auscultation bilaterally Cardio: regular rate and rhythm GI: soft, mild RLQ and suprapubic tenderness.   Lab Results:   Recent Labs  09/19/13 0400  WBC 16.5*  HGB 9.5*  HCT 29.0*  PLT 464*   BMET No results found for this basename: NA, K, CL, CO2, GLUCOSE, BUN, CREATININE, CALCIUM,  in the last 72 hours PT/INR No results found for this basename: LABPROT, INR,  in the last 72 hours ABG No results found for this basename: PHART, PCO2, PO2, HCO3,  in the last 72 hours  Studies/Results: Mr Heel Right Wo Contrast  09/19/2013   CLINICAL DATA:  RIGHT heel osteomyelitis.  EXAM: MR OF THE RIGHT HEEL WITHOUT CONTRAST  TECHNIQUE: Multiplanar, multisequence MR imaging was performed. No intravenous contrast was administered.  COMPARISON:  None.  FINDINGS: Patient was uncooperative with scanning and refused to continue imaging. 4 imaging sequences were obtained extending from the distal leg through the midfoot.  There is an ulcer over the lateral aspect of the calcaneal tuberosity with cortical osteolysis and bone marrow edema. This is just inferior to the insertion of the Achilles tendon and consistent with osteomyelitis. No soft tissue abscess is present.  Diffuse edema is present. This is compatible with cellulitis in the appropriate clinical setting. Medial and lateral ankle ligaments appear intact. Achilles tendon intact.   IMPRESSION: Lateral posterior calcaneal tuberosity osteomyelitis.   Electronically Signed   By: Dereck Ligas M.D.   On: 09/19/2013 13:59    Anti-infectives: Anti-infectives   Start     Dose/Rate Route Frequency Ordered Stop   09/18/13 1000  fluconazole (DIFLUCAN) tablet 100 mg  Status:  Discontinued     100 mg Oral Daily 09/17/13 1607 09/17/13 1628   09/17/13 1615  fluconazole (DIFLUCAN) tablet 200 mg  Status:  Discontinued     200 mg Oral  Once 09/17/13 1606 09/17/13 1628   09/17/13 1230  vancomycin (VANCOCIN) 50 mg/mL oral solution 125 mg     125 mg Oral 4 times per day 09/17/13 1205     09/15/13 2000  ciprofloxacin (CIPRO) tablet 500 mg     500 mg Oral 2 times daily 09/15/13 1356 09/18/13 2043   09/13/13 1400  metroNIDAZOLE (FLAGYL) tablet 500 mg  Status:  Discontinued     500 mg Oral 3 times per day 09/13/13 0931 09/20/13 0957   09/13/13 1200  vancomycin (VANCOCIN) 500 mg in sodium chloride irrigation 0.9 % 100 mL ENEMA  Status:  Discontinued     500 mg Rectal 4 times per day 09/13/13 0842 09/20/13 0957   09/08/13 1400  piperacillin-tazobactam (ZOSYN) IVPB 3.375 g  Status:  Discontinued     3.375 g 12.5 mL/hr over 240 Minutes Intravenous Every 8 hours 09/08/13 0915 09/15/13 1356   09/08/13 1200  vancomycin (VANCOCIN) 50 mg/mL oral solution 250 mg  Status:  Discontinued     250 mg Oral 4 times  per day 09/08/13 1016 09/13/13 0842   09/05/13 1000  metroNIDAZOLE (FLAGYL) IVPB 500 mg  Status:  Discontinued     500 mg 100 mL/hr over 60 Minutes Intravenous 3 times per day 09/05/13 0834 09/13/13 0930   09/05/13 0930  metroNIDAZOLE (FLAGYL) IVPB 500 mg  Status:  Discontinued     500 mg 100 mL/hr over 60 Minutes Intravenous Every 8 hours 09/05/13 0927 09/05/13 0939   09/05/13 0800  piperacillin-tazobactam (ZOSYN) IVPB 2.25 g  Status:  Discontinued     2.25 g 100 mL/hr over 30 Minutes Intravenous Every 6 hours 09/05/13 0213 09/08/13 0915   09/05/13 0030  piperacillin-tazobactam (ZOSYN)  IVPB 3.375 g     3.375 g 100 mL/hr over 30 Minutes Intravenous  Once 09/05/13 0028 09/05/13 0128      Assessment/Plan: s/p * No surgery found * Appendicitis. Treatment complete. follow up when medically stable to see if she will even be candidate for interval appendectomy because of her comorbid conditions C. Diff colitis. Continue oral vanc SNF when medically stable  LOS: 17 days    TOTH III,PAUL S 09/21/2013

## 2013-09-22 NOTE — Progress Notes (Signed)
[facility] PROGRESS NOTE  Renee Donovan ZOX:[id number] DOB: 12-29-1930 DOA: 09/04/2013 PCP: [name], MD Interim summary: 80 year old female with past medical history of COPD on 2 L home oxygen, osteoarthritis, from SNF who presented to [facility] ED 09/04/2013 with worsening nausea, vomiting, diarrhea and associated lower abdominal pain for past 5 days prior to this admission. On admission, patient was found to have possible perforated appendicitis for which surgery was consulted. In addition, patient had about 10 minute episode of wide complex tachycardia which has resolved with a bolus of amiodarone. She was found to have acute appendicitis, surgery consulted , recommended medical management. She will probably need a follow up with surgery for possible appendectomy.   Assessment/Plan: #1 acute appendicitis Patient with some clinical improvement. Per general surgery repeat CT scan showing improvement of appendicitis. Colitis noted on CT scan. WBC is trending down. Afebrile. She has completed the course of oral flagyl and oral ciprofloxacin. Patient is tolerating pured diet. General surgeon following and appreciate input and recommendations.  #2 C. difficile colitis Patient noted to have multiple loose stools. WBC trending down. Abdominal pain improved. Continue vancomycin enema day #12/14. Completed flagyl.  Due to continued multiple loose stools and leukocytosis, we have consulted with ID for further recommendations on antibiotic management. Recommended to stop ciprofloxacin and continue with oral vancomycin for a total of 6 weeks. To stop rectal vancomycin and flagyl after finishing up the 14 day course.   #3 chronic diastolic CHF Stable. Grade 2 diastolic dysfunction noted on 2-D echo of 09/05/2013 with a preserved EF. Continue beta blocker, imdur, hydralazine. Per cardiology.  #4 wide complex tachycardia Episode noted on the night of 09/08/2013. Patient has been started on amiodarone  per cardiology. Amiodarone dose has been changed to 200 mg daily for the next 8 weeks and then DC per cardiology. Continue beta blocker. Outpatient followup with Dr. Percival Spanish of cardiology as outpatient.  #5 COPD with emphysema Stable. Continue Spiriva.  #6 acute on chronic kidney disease stage IV Baseline creatinine approximately 1.8. On admission was 3.05 in the setting of acute perforated appendicitis and also in the setting of ACE inhibitor. ACE inhibitor has been held. Renal function stable and currently at 1.50. Stable. Monitor fluid status.  #7 hypokalemia/hypomagnesemia Likely secondary to GI losses from C. difficile colitis. Repleted.  #8 anemia Likely dilutional in nature. H&H stable. Follow.  #9 hypertension Continue Lopressor, imdur, hydralazine, clonidine patch.  #10 adnexal cystic lesions Will need outpatient abdominal ultrasound followup.  #11 leukocytosis Questionable etiology. Likely secondary to C. difficile colitis. Chest x-ray shows new left lung base opacity most likely combination of small effusion with either infiltrate or atelectasis. Patient compelted 10 day of IV Zosyn and has no respiratory symptoms and unlikely to be a pneumonia. Urine cultures negative. Continue vancomycin enema, oral Flagyl, Follow.  #12 prophylaxis SCDs for DVT prophylaxis.  #13 Decubitus right heel ulcer: MRI of the heel ordered and showed calcaneal tuberosity osteomyeleitis. ABI of the lower extremities ordered. Wound care consulted. Patient does not want any kind of surgery or intervention on her feet. We will schedule wound care followup .  Code Status: DO NOT RESUSCITATE Family Communication: Updated patient no family at bedside. Disposition Plan:SNF when medically stable.   Consultants:  Cardiology: Dr. Elias Else 09/05/2013   General surgery: Dr. Excell Seltzer 09/05/2013  Procedures: Ct Abdomen Pelvis Wo Contras 09/05/2013 IMPRESSION: Probable acute versus possibly early  perforated appendicitis as evidenced by 2.9 x 2.3 cm inflammatory mass adjacent to the  cecum. No abscess or pneumoperitoneum. Low-grade partial small bowel obstruction or, ileus from adjacent appendiceal process. Bilaterals cystic adnexal masses, smaller on the left which would be atypical for neoplasm. Recommend follow-up ultrasound for further characterization. Small right pleural effusion with right lower lobe airspace opacity which may reflect atelectasis, less likely early pneumonia. Severe aortic atherosclerosis was similar multi segmental ectasia. Dg Chest Portable 1 View 09/04/2013 IMPRESSION: No acute abnormality. Probable emphysema  CT abdomen and pelvis without contrast 09/10/2013 IMPRESSION:  Enlarged thickened appendix with mild periappendiceal infiltrative  changes most likely representing a acute appendicitis.  No definite evidence of perforation or abscess.  Bowel wall thickening extending from the rectum to the distal  transverse colon raising question of colitis new since previous  exam.  Cholelithiasis.  Small abdominal aortic aneurysms.  BILATERAL ovarian cysts up to 4.8 cm in size ; recommend followup  nonemergent sonographic characterization.  Chest x-ray 09/04/2013 2-D echo 09/05/2013   Antibiotics: IV Zosyn 09/04/2013 -->09/15/13  IV Flagyl 09/05/2013 --> 09/13/13 Vancomycin PO 6/14 -->09/13/13 Vancomycin enema 09/13/13 Oral Flagyl 09/13/13 Oral cipro 09/15/13   HPI/Subjective:  No nausea, no vomiting. Rectal tube has come out yesterday. She had 3 BM so far .  Requested to speak to family members over the phone. She did not want me to talk to the family .  She is comfortable. No family at bedside.   Objective: Filed Vitals:   09/22/13 1455  BP: 141/62  Pulse: 64  Temp:   Resp:     Intake/Output Summary (Last 24 hours) at 09/22/13 1711 Last data filed at 09/22/13 1456  Gross per 24 hour  Intake    240 ml  Output      0 ml  Net    240 ml   Filed Weights    09/20/13 0650 09/21/13 0556 09/22/13 0642  Weight: 72.666 kg (160 lb 3.2 oz) 72.303 kg (159 lb 6.4 oz) 72 kg (158 lb 11.7 oz)    Exam:   General:  NAD  Cardiovascular: RRR  Respiratory: CTAB  Abdomen: Soft/ND/ min-mild TTP/+BS  Musculoskeletal: right heel ulcer  Data Reviewed: Basic Metabolic Panel:  Recent Labs Lab 09/16/13 0650 09/17/13 0325 09/18/13 0504  NA 142 141 141  K 3.7 3.8 3.8  CL 106 105 105  CO2 26 27 27   GLUCOSE 93 76 76  BUN 14 14 14   CREATININE 1.49* 1.50* 1.48*  CALCIUM 7.0* 7.4* 7.5*  MG 1.5 1.5 1.7   Liver Function Tests: No results found for this basename: AST, ALT, ALKPHOS, BILITOT, PROT, ALBUMIN,  in the last 168 hours No results found for this basename: LIPASE, AMYLASE,  in the last 168 hours No results found for this basename: AMMONIA,  in the last 168 hours CBC:  Recent Labs Lab 09/16/13 0650 09/17/13 0325 09/19/13 0400  WBC 17.0* 17.0* 16.5*  NEUTROABS 12.1* 12.7*  --   HGB 10.0* 9.7* 9.5*  HCT 29.4* 29.1* 29.0*  MCV 87.5 89.3 91.8  PLT 373 406* 464*   Cardiac Enzymes: No results found for this basename: CKTOTAL, CKMB, CKMBINDEX, TROPONINI,  in the last 168 hours BNP (last 3 results)  Recent Labs  07/25/13 0308  PROBNP 4353.0*   CBG: No results found for this basename: GLUCAP,  in the last 168 hours  Recent Results (from the past 240 hour(s))  CULTURE, BLOOD (ROUTINE X 2)     Status: None   Collection Time    09/13/13  7:25 AM  Result Value Ref Range Status   Specimen Description BLOOD LEFT ARM   Final   Special Requests BOTTLES DRAWN AEROBIC ONLY 1 CC   Final   Culture  Setup Time     Final   Value: 09/13/2013 12:01     Performed at [facility]   Culture     Final   Value: NO GROWTH 5 DAYS     Performed at [facility]   Report Status 09/19/2013 FINAL   Final  CULTURE, BLOOD (ROUTINE X 2)     Status: None   Collection Time    09/13/13  8:29 AM      Result Value Ref Range Status    Specimen Description BLOOD LEFT AC   Final   Special Requests BOTTLES DRAWN AEROBIC AND ANAEROBIC 6 CC   Final   Culture  Setup Time     Final   Value: 09/13/2013 12:01     Performed at [facility]   Culture     Final   Value: NO GROWTH 5 DAYS     Performed at [facility]   Report Status 09/19/2013 FINAL   Final  URINE CULTURE     Status: None   Collection Time    09/13/13 10:20 AM      Result Value Ref Range Status   Specimen Description URINE, CATHETERIZED   Final   Special Requests NONE   Final   Culture  Setup Time     Final   Value: 09/13/2013 12:30     Performed at [facility] Count     Final   Value: NO GROWTH     Performed at [facility]   Culture     Final   Value: NO GROWTH     Performed at [facility]   Report Status 09/14/2013 FINAL   Final     Studies: No results found.  Scheduled Meds: . albuterol  2.5 mg Nebulization BID  . amiodarone  200 mg Oral Daily  . antiseptic oral rinse  15 mL Mouth Rinse q12n4p  . chlorhexidine  15 mL Mouth Rinse BID  . cloNIDine  0.2 mg Transdermal Weekly  . collagenase   Topical UD  . feeding supplement (RESOURCE BREEZE)  1 Container Oral TID BM  . hydrALAZINE  25 mg Oral 3 times per day  . isosorbide mononitrate  120 mg Oral Daily  . magnesium oxide  400 mg Oral BID  . magnesium sulfate 1 - 4 g bolus IVPB  3 g Intravenous Once  . metoprolol tartrate  25 mg Oral BID  . nystatin   Topical TID  . saccharomyces boulardii  250 mg Oral BID  . tiotropium  18 mcg Inhalation Daily  . vancomycin  125 mg Oral 4 times per day   Continuous Infusions: .  sodium bicarbonate  infusion 1000 mL Stopped (09/16/13 1115)    Principal Problem:   Acute perforated appendicitis Active Problems:   COPD (chronic obstructive pulmonary disease) with emphysema   Nausea and vomiting   ARF (acute renal failure)   Diastolic CHF, chronic   Chronic respiratory failure with hypoxia   Ventricular  tachycardia, sustained   Protein-calorie malnutrition, severe   Wide-complex tachycardia   Hypokalemia   Hypomagnesemia   Clostridium difficile colitis    Time spent: 29 mins    AKULA,VIJAYA MD [facility] Pager [phone]. If 7PM-7AM, please contact night-coverage at [email or web address], password [facility] 09/22/2013, 5:11  PM  LOS: 18 days

## 2013-09-22 NOTE — Progress Notes (Signed)
Subjective: No complaints  Objective: Vital signs in last 24 hours: Temp:  [97.2 F (36.2 C)-97.8 F (36.6 C)] 97.6 F (36.4 C) (06/28 1941) Pulse Rate:  [60-62] 62 (06/28 0958) Resp:  [18] 18 (06/28 0642) BP: (140-162)/(51-66) 162/62 mmHg (06/28 0958) SpO2:  [97 %-100 %] 100 % (06/28 0642) Weight:  [158 lb 11.7 oz (72 kg)] 158 lb 11.7 oz (72 kg) (06/28 0642) Last BM Date: 09/21/13  Intake/Output from previous day: 06/27 0701 - 06/28 0700 In: 120 [P.O.:120] Out: -  Intake/Output this shift:    Resp: clear to auscultation bilaterally Cardio: regular rate and rhythm GI: soft, nontender  Lab Results:  No results found for this basename: WBC, HGB, HCT, PLT,  in the last 72 hours BMET No results found for this basename: NA, K, CL, CO2, GLUCOSE, BUN, CREATININE, CALCIUM,  in the last 72 hours PT/INR No results found for this basename: LABPROT, INR,  in the last 72 hours ABG No results found for this basename: PHART, PCO2, PO2, HCO3,  in the last 72 hours  Studies/Results: No results found.  Anti-infectives: Anti-infectives   Start     Dose/Rate Route Frequency Ordered Stop   09/18/13 1000  fluconazole (DIFLUCAN) tablet 100 mg  Status:  Discontinued     100 mg Oral Daily 09/17/13 1607 09/17/13 1628   09/17/13 1615  fluconazole (DIFLUCAN) tablet 200 mg  Status:  Discontinued     200 mg Oral  Once 09/17/13 1606 09/17/13 1628   09/17/13 1230  vancomycin (VANCOCIN) 50 mg/mL oral solution 125 mg     125 mg Oral 4 times per day 09/17/13 1205     09/15/13 2000  ciprofloxacin (CIPRO) tablet 500 mg     500 mg Oral 2 times daily 09/15/13 1356 09/18/13 2043   09/13/13 1400  metroNIDAZOLE (FLAGYL) tablet 500 mg  Status:  Discontinued     500 mg Oral 3 times per day 09/13/13 0931 09/20/13 0957   09/13/13 1200  vancomycin (VANCOCIN) 500 mg in sodium chloride irrigation 0.9 % 100 mL ENEMA  Status:  Discontinued     500 mg Rectal 4 times per day 09/13/13 0842 09/20/13 0957   09/08/13 1400  piperacillin-tazobactam (ZOSYN) IVPB 3.375 g  Status:  Discontinued     3.375 g 12.5 mL/hr over 240 Minutes Intravenous Every 8 hours 09/08/13 0915 09/15/13 1356   09/08/13 1200  vancomycin (VANCOCIN) 50 mg/mL oral solution 250 mg  Status:  Discontinued     250 mg Oral 4 times per day 09/08/13 1016 09/13/13 0842   09/05/13 1000  metroNIDAZOLE (FLAGYL) IVPB 500 mg  Status:  Discontinued     500 mg 100 mL/hr over 60 Minutes Intravenous 3 times per day 09/05/13 0834 09/13/13 0930   09/05/13 0930  metroNIDAZOLE (FLAGYL) IVPB 500 mg  Status:  Discontinued     500 mg 100 mL/hr over 60 Minutes Intravenous Every 8 hours 09/05/13 0927 09/05/13 0939   09/05/13 0800  piperacillin-tazobactam (ZOSYN) IVPB 2.25 g  Status:  Discontinued     2.25 g 100 mL/hr over 30 Minutes Intravenous Every 6 hours 09/05/13 0213 09/08/13 0915   09/05/13 0030  piperacillin-tazobactam (ZOSYN) IVPB 3.375 g     3.375 g 100 mL/hr over 30 Minutes Intravenous  Once 09/05/13 0028 09/05/13 0128      Assessment/Plan: s/p * No surgery found * Continue tx for c diff abx for appendicitis complete Waiting for SNF  LOS: 18 days    TOTH III,PAUL S  09/22/2013  

## 2013-09-23 LAB — BASIC METABOLIC PANEL
BUN: 13 mg/dL (ref 6–23)
CO2: 27 meq/L (ref 19–32)
Calcium: 7.7 mg/dL — ABNORMAL LOW (ref 8.4–10.5)
Chloride: 105 mEq/L (ref 96–112)
Creatinine, Ser: 1.13 mg/dL — ABNORMAL HIGH (ref 0.50–1.10)
GFR calc Af Amer: 51 mL/min — ABNORMAL LOW (ref >90)
GFR calc non Af Amer: 44 mL/min — ABNORMAL LOW (ref >90)
Glucose, Bld: 85 mg/dL (ref 70–99)
Potassium: 3.9 mEq/L (ref 3.7–5.3)
Sodium: 141 mEq/L (ref 137–147)

## 2013-09-23 LAB — CBC
HCT: 25.9 % — ABNORMAL LOW (ref 36.0–46.0)
Hemoglobin: 8.5 g/dL — ABNORMAL LOW (ref 12.0–15.0)
MCH: 30.1 pg (ref 26.0–34.0)
MCHC: 32.8 g/dL (ref 30.0–36.0)
MCV: 91.8 fL (ref 78.0–100.0)
PLATELETS: 426 10*3/uL — AB (ref 150–400)
RBC: 2.82 MIL/uL — AB (ref 3.87–5.11)
RDW: 16.5 % — AB (ref 11.5–15.5)
WBC: 12.4 10*3/uL — ABNORMAL HIGH (ref 4.0–10.5)

## 2013-09-23 MED ORDER — VANCOMYCIN 50 MG/ML ORAL SOLUTION
125.0000 mg | Freq: Every day | ORAL | Status: AC
Start: 2013-10-08 — End: 2013-10-14

## 2013-09-23 MED ORDER — VANCOMYCIN 50 MG/ML ORAL SOLUTION
125.0000 mg | ORAL | Status: AC
Start: 2013-10-22 — End: 2013-10-28

## 2013-09-23 MED ORDER — AMIODARONE HCL 200 MG PO TABS: 200.0000 mg | ORAL_TABLET | Freq: Every day | ORAL | Status: AC

## 2013-09-23 MED ORDER — BOOST / RESOURCE BREEZE PO LIQD: 1.0000 | Freq: Three times a day (TID) | ORAL | Status: AC

## 2013-09-23 MED ORDER — COLLAGENASE 250 UNIT/GM EX OINT: TOPICAL_OINTMENT | CUTANEOUS | Status: AC

## 2013-09-23 MED ORDER — LEVALBUTEROL HCL 1.25 MG/0.5ML IN NEBU: 1.2500 mg | INHALATION_SOLUTION | Freq: Four times a day (QID) | RESPIRATORY_TRACT | Status: AC | PRN

## 2013-09-23 MED ORDER — VANCOMYCIN 50 MG/ML ORAL SOLUTION: 125.0000 mg | ORAL | Status: AC

## 2013-09-23 MED ORDER — VANCOMYCIN 50 MG/ML ORAL SOLUTION
125.0000 mg | Freq: Three times a day (TID) | ORAL | Status: AC
Start: 2013-09-24 — End: 2013-09-30

## 2013-09-23 MED ORDER — VANCOMYCIN 50 MG/ML ORAL SOLUTION: 125.0000 mg | Freq: Four times a day (QID) | ORAL | Status: AC

## 2013-09-23 MED ORDER — METOPROLOL TARTRATE 25 MG PO TABS: 25.0000 mg | ORAL_TABLET | Freq: Two times a day (BID) | ORAL | Status: AC

## 2013-09-23 MED ORDER — SACCHAROMYCES BOULARDII 250 MG PO CAPS: 250.0000 mg | ORAL_CAPSULE | Freq: Two times a day (BID) | ORAL | Status: AC

## 2013-09-23 MED ORDER — HYDROCODONE-ACETAMINOPHEN 5-325 MG PO TABS
1.0000 | ORAL_TABLET | ORAL | Status: DC | PRN
  Administered 2013-09-23 (×2): 1 via ORAL
  Filled 2013-09-23 (×2): qty 1

## 2013-09-23 MED ORDER — VANCOMYCIN 50 MG/ML ORAL SOLUTION: 125.0000 mg | Freq: Two times a day (BID) | ORAL | Status: AC

## 2013-09-23 MED ORDER — HYDRALAZINE HCL 25 MG PO TABS: 25.0000 mg | ORAL_TABLET | Freq: Three times a day (TID) | ORAL | Status: AC

## 2013-09-23 MED ORDER — ISOSORBIDE MONONITRATE ER 120 MG PO TB24: 120.0000 mg | ORAL_TABLET | Freq: Every day | ORAL | Status: AC

## 2013-09-23 NOTE — Progress Notes (Signed)
Report called to Alice Reichert LPN @ [facility]

## 2013-09-23 NOTE — Progress Notes (Signed)
Patient ID: Renee Donovan, female   DOB: 21-Jul-1930, 80 y.o.   MRN: [id number]  General Surgery - [facility] - Progress Note  Subjective: Patient pleasant, no complaints.  Tolerating diet.  Denies abdominal pain.  Objective: Vital signs in last 24 hours: Temp:  [97.8 F (36.6 C)-98.1 F (36.7 C)] 98 F (36.7 C) (06/29 0424) Pulse Rate:  [59-64] 59 (06/29 0424) Resp:  [18] 18 (06/29 0424) BP: (135-167)/(54-72) 149/54 mmHg (06/29 0424) SpO2:  [97 %-99 %] 98 % (06/29 0723) Weight:  [160 lb 14.4 oz (72.984 kg)] 160 lb 14.4 oz (72.984 kg) (06/29 0219) Last BM Date: 09/22/13  Intake/Output from previous day: 06/28 0701 - 06/29 0700 In: 480 [P.O.:480] Out: -   Exam: HEENT - clear, not icteric Neck - soft Chest - clear bilaterally Cor - RRR, no murmur Abd - soft without distension; no tenderness Ext - no significant edema Neuro - grossly intact, no focal deficits  Lab Results:   Recent Labs  09/23/13 0428  WBC 12.4*  HGB 8.5*  HCT 25.9*  PLT 426*     Recent Labs  09/23/13 0428  NA 141  K 3.9  CL 105  CO2 27  GLUCOSE 85  BUN 13  CREATININE 1.13*  CALCIUM 7.7*    Studies/Results: No results found.  Assessment / Plan: 1.  Perforated acute appendicitis  Resolved with abx and non-operative management 2.  C. Diff colitis  Completing course of treatment with Vancomycin  ID consult pending for further recommendations for abx management  Will follow with you.  Hopefully will not require surgical intervention.  Earnstine Regal, MD, [facility] Office: [phone]  09/23/2013

## 2013-09-23 NOTE — Progress Notes (Signed)
BRIEF PHARMACY NOTE  3 yof with C. Difficile colitis.  Pharmacy has been consulted to dose an oral vancomycin taper to complete a 6 week treatment. Vancomycin 125mg  PO QID has been given from 6/23 through today.   Oral Vancomycin Taper Recommendation: 6/30-7/6: Vancomycin 125mg  PO TID 7/7-7/13: Vancomycin 125mg  PO BID 7/14-7/20: Vancomycin 125mg  PO daily 7/21-7/27: Vancomycin 125mg  PO every other day 7/28-8/3: Vancomycin 125mg  PO Q72H  Kizzie Furnish, PharmD Pager: [phone] 09/23/2013 9:58 AM

## 2013-09-23 NOTE — Progress Notes (Signed)
Patient is set to discharge back to [facility] today. Patient's grandson, John aware. Discharge packet in [place], Arbie Cookey aware. [facility]AR scheduled for 2:00pm pickup.   Raynaldo Opitz, [facility] Clinical Social Worker cell #: [phone]

## 2013-09-23 NOTE — Discharge Summary (Signed)
Physician Discharge Summary  Renee Donovan:[id number] DOB: 22-Apr-1930 DOA: 09/04/2013  PCP: [name], MD  Admit date: 09/04/2013 Discharge date: 09/23/2013  Time spent: 50 minutes  Recommendations for Outpatient Follow-up:  1. Follow up with cardiology as recommended 2. Follow up with [place] [facility] / Dr [name] 3. [facility] up on the osteomyelitis of the right heel 4. Follow up with [place] [facility] in 2 weeks.  5. Follow up with bmp and cbc in 2 to3 days.  6. Follow up with PT as recommended.  7. Follow up with abdominal ultrasound in 2 to 4 weeks to follow up on the adnexal cystic lesions.   Discharge Diagnoses:  Principal Problem:   Acute perforated appendicitis Active Problems:   COPD (chronic obstructive pulmonary disease) with emphysema   Nausea and vomiting   ARF (acute renal failure)   Diastolic CHF, chronic   Chronic respiratory failure with hypoxia   Ventricular tachycardia, sustained   Protein-calorie malnutrition, severe   Wide-complex tachycardia   Hypokalemia   Hypomagnesemia   Clostridium difficile colitis   Discharge Condition: much improved  Diet recommendation: low sodium diet  Filed Weights   09/21/13 0556 09/22/13 0642 09/23/13 0219  Weight: 72.303 kg (159 lb 6.4 oz) 72 kg (158 lb 11.7 oz) 72.984 kg (160 lb 14.4 oz)    History of present illness:  80 year old female with past medical history of COPD on 2 L home oxygen, osteoarthritis, from SNF who presented to [facility] ED 09/04/2013 with worsening nausea, vomiting, diarrhea and associated lower abdominal pain for past 5 days prior to this admission. On admission, patient was found to have possible perforated appendicitis for which surgery was consulted. In addition, patient had about 10 minute episode of wide complex tachycardia which has resolved with a bolus of amiodarone. She was found to have acute appendicitis, surgery  consulted , recommended medical management. She will probably need a follow up with surgery for possible appendectomy in the future. Patient also developed c diff colitis and she was started on oral flagyl,( completed the course), vancomycin enemas ( complete the course), currently she is on vancomycin taper for the next 5 weeks to complete the course. She was also found to have stage 3 right heel pressure ulcer, an MRI of the heel revealed osteomyelitis. An orthopedics consultation requested to see if she needs debridement or a punch biopsy for identification of the bacteria, unfortunately patient refused any kind of intervention, including biopsies, and surgery. She reported that she would rather be on antibiotics. Because of her complicated situation with c diff colitis, we are not able to put her on more antibiotics.  Plan is to complete the vancomycin taper and start her on antibiotics for the osteomyelitis. She will be following up with surgery for her appendicitis, ID for antibiotics for OM of the heel in two weeks. She  Is also recommended to follow up with wound care clinic at [facility] with Dr [name].     #1 acute perforated appendicitis evident on CT scan.  She was initially seen by surgery , recommended conservative management with IV antibiotics. Repeat CT abdomen shows improvement in the appendicitis. She completed 10 days of IV zosyn and completed the course of ciprofloxacin. Plan to follow up with surgery as outpatient to see if needs appendectomy in the future.   #2 C. difficile colitis  Patient noted to have multiple loose stools. Her C DIFF pcr  is positive. She was started on oral flagyl and vancomycin enemas.   Due to continued multiple loose stools and leukocytosis, we have consulted with ID for further recommendations on antibiotic management. Recommended to stop ciprofloxacin and continue with oral vancomycin for a total of 6 weeks. To stop rectal vancomycin and flagyl after finishing  up the 14 day course. She has completed the 14 day course of rectal vanco and oral flagyl and is on 5 more weeks of oral vancomycin.  She is on regular diet and able to tolerate without symptoms. Her stool is formed.   #3 chronic diastolic CHF  Stable. Grade 2 diastolic dysfunction noted on 2-D echo of 09/05/2013 with a preserved EF. Continue beta blocker, imdur, hydralazine. Per cardiology as outpatient. #4 wide complex tachycardia  Episode noted on the night of 09/08/2013. Patient has been started on amiodarone per cardiology. Amiodarone dose has been changed to 200 mg daily for the next 8 weeks and then DC per cardiology. Continue beta blocker. Outpatient followup with Dr. Percival Spanish of cardiology as outpatient.  #5 COPD with emphysema  Stable. Continue Spiriva.  #6 acute on chronic kidney disease stage IV  Baseline creatinine approximately 1.8. On admission was 3.05 in the setting of acute perforated appendicitis and also in the setting of ACE inhibitor. ACE inhibitor has been held. Renal function stable and currently around 1. Stable.  #7 hypokalemia/hypomagnesemia  Likely secondary to GI losses from C. difficile colitis. Repleted.  #8 anemia  Likely dilutional in nature. H&H stable. Follow.  #9 hypertension  Continue Lopressor, imdur, hydralazine,.  #10 adnexal cystic lesions  Will need outpatient abdominal ultrasound followup.  #11 leukocytosis  Likely secondary to C. difficile colitis. Patient compelted 10 day of IV Zosyn, few days of ciprofloxacin , oral flagyl and vancomycin enemas and has no respiratory symptoms and unlikely to be a pneumonia. Urine cultures negative. Her leukocytosis is improving.   #13 Decubitus right heel ulcer:  MRI of the heel ordered and showed calcaneal tuberosity osteomyeleitis. ABI of the lower extremities ordered. Wound care consulted. Patient does not want any kind of surgery or intervention on her feet, not even a biopsy. We will schedule wound care  followup .   Procedures:  CT abdomen and pelvis.   Consultations: Cardiology: Dr. Elias Else 09/05/2013  General surgery: Dr. Excell Seltzer 09/05/2013 Infectious disease Dr Johnnye Sima, Dr Linus Salmons Dr [facility] Dr Rolena Infante Physical therapy Wound care consult.    Discharge Exam: Filed Vitals:   09/23/13 0424  BP: 149/54  Pulse: 59  Temp: 98 F (36.7 C)  Resp: 18    General: alert afebrile comfortable Cardiovascular: s1s2 Respiratory: ctab  Discharge Instructions You were cared for by a hospitalist during your hospital stay. If you have any questions about your discharge medications or the care you received while you were in the hospital after you are discharged, you can call the unit and asked to speak with the hospitalist on call if the hospitalist that took care of you is not available. Once you are discharged, your primary care physician will handle any further medical issues. Please note that NO REFILLS for any discharge medications will be authorized once you are discharged, as it is imperative that you return to your primary care physician (or establish a relationship with a primary care physician if you do not have one) for your aftercare needs so that they can reassess your need for medications and monitor your lab values.  Discharge Instructions   Discharge instructions  Complete by:  As directed   Follow up with PCP in one week Follow up with ID as recommended in 2 to 4 weeks.  Follow up with wound care as recommended .            Medication List    STOP taking these medications       lisinopril-hydrochlorothiazide 20-12.5 MG per tablet  Commonly known as:  PRINZIDE,ZESTORETIC     NON FORMULARY     PROMETHAZINE HCL IM     verapamil 120 MG tablet  Commonly known as:  CALAN      TAKE these medications       albuterol 108 (90 BASE) MCG/ACT inhaler  Commonly known as:  PROVENTIL HFA;VENTOLIN HFA  Inhale 1-2 puffs into the lungs every 6 (six) hours as  needed for wheezing or shortness of breath.     amiodarone 200 MG tablet  Commonly known as:  PACERONE  Take 1 tablet (200 mg total) by mouth daily.     buPROPion 300 MG 24 hr tablet  Commonly known as:  WELLBUTRIN XL  Take 1 tablet (300 mg total) by mouth daily.     cloNIDine 0.1 MG tablet  Commonly known as:  CATAPRES  Take 0.1 mg by mouth 2 (two) times daily.     collagenase ointment  Commonly known as:  SANTYL  Apply topically as directed.     feeding supplement (RESOURCE BREEZE) Liqd  Take 1 Container by mouth 3 (three) times daily between meals.     furosemide 20 MG tablet  Commonly known as:  LASIX  Take 1 tablet (20 mg total) by mouth daily. Start on 07/31/13     hydrALAZINE 25 MG tablet  Commonly known as:  APRESOLINE  Take 1 tablet (25 mg total) by mouth every 8 (eight) hours.     isosorbide mononitrate 120 MG 24 hr tablet  Commonly known as:  IMDUR  Take 1 tablet (120 mg total) by mouth daily.     levalbuterol 1.25 MG/0.5ML nebulizer solution  Commonly known as:  XOPENEX  Take 1.25 mg by nebulization every 6 (six) hours as needed for wheezing or shortness of breath.     levothyroxine 50 MCG tablet  Commonly known as:  SYNTHROID, LEVOTHROID  Take 1 tablet (50 mcg total) by mouth daily.     lovastatin 40 MG tablet  Commonly known as:  MEVACOR  Take 40 mg by mouth at bedtime.     metoprolol tartrate 25 MG tablet  Commonly known as:  LOPRESSOR  Take 1 tablet (25 mg total) by mouth 2 (two) times daily.     nicotine 14 mg/24hr patch  Commonly known as:  NICODERM CQ - dosed in mg/24 hours  Place 14 mg onto the skin daily.     ondansetron 8 MG tablet  Commonly known as:  ZOFRAN  Take 1 tablet (8 mg total) by mouth every 8 (eight) hours as needed for nausea or vomiting.     potassium chloride SA 20 MEQ tablet  Commonly known as:  K-DUR,KLOR-CON  Take 20 mEq by mouth daily.     saccharomyces boulardii 250 MG capsule  Commonly known as:  FLORASTOR  Take 1  capsule (250 mg total) by mouth 2 (two) times daily.     tiotropium 18 MCG inhalation capsule  Commonly known as:  SPIRIVA HANDIHALER  Place 1 capsule (18 mcg total) into inhaler and inhale daily.     vancomycin 50 mg/mL oral solution  Commonly  known as:  VANCOCIN  Take 2.5 mLs (125 mg total) by mouth every 6 (six) hours.     vancomycin 50 mg/mL oral solution  Commonly known as:  VANCOCIN  Take 2.5 mLs (125 mg total) by mouth 3 (three) times daily.  Start taking on:  09/24/2013 till 09/30/2013     vancomycin 50 mg/mL oral solution  Commonly known as:  VANCOCIN  Take 2.5 mLs (125 mg total) by mouth 2 (two) times daily.  Start taking on:  10/01/2013 till 10/07/2013     vancomycin 50 mg/mL oral solution  Commonly known as:  VANCOCIN  Take 2.5 mLs (125 mg total) by mouth daily.  Start taking on:  10/08/2013 till 10/14/2013     vancomycin 50 mg/mL oral solution  Commonly known as:  VANCOCIN  Take 2.5 mLs (125 mg total) by mouth every other day.  Start taking on:  10/15/2013 till 10/21/2013     vancomycin 50 mg/mL oral solution  Commonly known as:  VANCOCIN  Take 2.5 mLs (125 mg total) by mouth every 3 (three) days.  Start taking on:  10/22/2013 till 10/28/2013     Vitamin D (Ergocalciferol) 50000 UNITS Caps capsule  Commonly known as:  DRISDOL  Take 50,000 Units by mouth every 7 (seven) days.       No Known Allergies     Follow-up Information   Follow up with [facility]. Schedule an appointment as soon as possible for a visit in 2 weeks.   Contact information:   [place] [address] [phone]      Follow up with [facility]             . Schedule an appointment as soon as possible for a visit in 2 weeks.   Contact information:   [address] [place] [address]        The results of significant diagnostics from this hospitalization (including imaging, microbiology, ancillary  and laboratory) are listed below for reference.    Significant Diagnostic Studies: Ct Abdomen Pelvis Wo Contrast  09/10/2013   CLINICAL DATA:  Perforated appendicitis, lower abdominal pain, followup  EXAM: CT ABDOMEN AND PELVIS WITHOUT CONTRAST  TECHNIQUE: Multidetector CT imaging of the abdomen and pelvis was performed following the standard protocol without IV contrast. Patient drank dilute oral contrast for exam. Sagittal and coronal MPR images reconstructed from axial data set.  COMPARISON:  09/04/2013  FINDINGS: Small bibasilar pleural effusions and atelectasis.  Question tiny hiatal hernia.  Minimal pericardial effusion.  Extensive atherosclerotic calcification with aneurysmal dilatation of the abdominal aorta at the aortic hiatus 3.8 cm diameter and of the distal abdominal aorta 3.0 x 2.5 cm image 44.  Dependent gallstones in gallbladder.  Excreted contrast dependently within renal collecting systems and bladder.  Cyst mid LEFT kidney 2.3 x 1.7 cm unchanged.  Liver, spleen, pancreas, kidneys, and adrenal glands otherwise normal.  Ovaries enlarged bilaterally for age containing cysts up to 4.8 x 4.1 cm LEFT and 4.2 x 3.8 cm RIGHT.  Diffuse bowel wall thickening of the rectum and sigmoid colon extending proximally to the level of the distal transverse colon.  Inflammatory mass identified adjacent to the tip of the cecum 3.6 x 1.5 cm image 52, previously 3.7 x 1.6 cm question representing an enlarged thickened appendix and acute appendicitis.  This now contains a small amount of intraluminal GI contrast proximally.  Periappendiceal inflammatory changes without free intraperitoneal air  or fluid identified.  No additional mass, adenopathy, or hernia.  Stomach and remaining bowel loops grossly unremarkable.  Bones demineralized with chronic inferior endplate height loss and irregularity at T11.  IMPRESSION: Enlarged thickened appendix with mild periappendiceal infiltrative changes most likely representing a  acute appendicitis.  No definite evidence of perforation or abscess.  Bowel wall thickening extending from the rectum to the distal transverse colon raising question of colitis new since previous exam.  Cholelithiasis.  Small abdominal aortic aneurysms.  BILATERAL ovarian cysts up to 4.8 cm in size ; recommend followup nonemergent sonographic characterization.   Electronically Signed   By: Lavonia Dana M.D.   On: 09/10/2013 12:52   Ct Abdomen Pelvis Wo Contrast  09/05/2013   CLINICAL DATA:  Low abdominal pain.  EXAM: CT ABDOMEN AND PELVIS WITHOUT CONTRAST  TECHNIQUE: Multidetector CT imaging of the abdomen and pelvis was performed following the standard protocol without IV contrast.  COMPARISON:  CT of the abdomen and pelvis July 18, 2013.  FINDINGS: LUNG BASES: Included view of the lung bases demonstrate new small right pleural effusion with patchy airspace opacity. The visualized heart and pericardium are unchanged. At least 3.8 cm distal thoracic aortic ectasia, unchanged.  KIDNEYS/BLADDER: Kidneys are orthotopic, demonstrating normal size and morphology. No nephrolithiasis, hydronephrosis; limited assessment for renal masses on this nonenhanced examination. 17 mm left interpolar cyst similar The unopacified ureters are normal in course and caliber. Urinary bladder is partially distended and unremarkable.  SOLID ORGANS: The liver, spleen, pancreas and adrenal glands are unremarkable for this non-contrast examination. Multiple tiny cholelithiasis without CT findings of acute cholecystitis.  GASTROINTESTINAL TRACT: Small to moderate fluid filled hiatal hernia. Distended small bowel to 3.8 cm with air-fluid levels and small bowel feces. No discrete transition point, gentle tapering may reflect ileus. 2.3 x 2.9 cm inflammatory mass adjacent to the cecum, axial 57/92. The mass appears contiguous with the appendix, and likely reflects acute or possibly perforated appendicitis. The stomach, and large bowel are normal  in course and caliber without inflammatory changes, the sensitivity may be decreased by lack of enteric contrast. Colonic diverticulosis.  PERITONEUM/RETROPERITONEUM: No intraperitoneal free fluid nor free air. Aortoiliac vessels are normal in course and caliber, with moderate to severe calcific atherosclerosis. 2.7 cm ectatic infrarenal aorta. No lymphadenopathy by CT size criteria. The left adnexal cystic mass is greatly decreased in size from 8.1 x 8.4 cm to 4.1 x 0.8 cm. Slight increase in size of right adnexal mass, previously 5.4 x 3.4 cm, now 3.8 x 5.7 cm.  SOFT TISSUES/ OSSEOUS STRUCTURES: The soft tissues and included osseous structures are nonsuspicious. Multilevel severe degenerative disc disease.  IMPRESSION: Probable acute versus possibly early perforated appendicitis as evidenced by 2.9 x 2.3 cm inflammatory mass adjacent to the cecum. No abscess or pneumoperitoneum.  Low-grade partial small bowel obstruction or, ileus from adjacent appendiceal process.  Bilaterals cystic adnexal masses, smaller on the left which would be atypical for neoplasm. Recommend follow-up ultrasound for further characterization.  Small right pleural effusion with right lower lobe airspace opacity which may reflect atelectasis, less likely early pneumonia.  Severe aortic atherosclerosis was similar multi segmental ectasia.  Preliminary findings discussed with and reconfirmed by [facility] PICKERING on6/10/2015at12:12 AM.   Electronically Signed   By: Elon Alas   On: 09/05/2013 00:13   Mr Heel Right Wo Contrast  09/19/2013   CLINICAL DATA:  RIGHT heel osteomyelitis.  EXAM: MR OF THE RIGHT HEEL WITHOUT CONTRAST  TECHNIQUE: Multiplanar, multisequence MR imaging  was performed. No intravenous contrast was administered.  COMPARISON:  None.  FINDINGS: Patient was uncooperative with scanning and refused to continue imaging. 4 imaging sequences were obtained extending from the distal leg through the midfoot.  There is an ulcer  over the lateral aspect of the calcaneal tuberosity with cortical osteolysis and bone marrow edema. This is just inferior to the insertion of the Achilles tendon and consistent with osteomyelitis. No soft tissue abscess is present.  Diffuse edema is present. This is compatible with cellulitis in the appropriate clinical setting. Medial and lateral ankle ligaments appear intact. Achilles tendon intact.  IMPRESSION: Lateral posterior calcaneal tuberosity osteomyelitis.   Electronically Signed   By: Dereck Ligas M.D.   On: 09/19/2013 13:59   Dg Chest Port 1 View  09/13/2013   CLINICAL DATA:  Leukocytosis  EXAM: PORTABLE CHEST - 1 VIEW  COMPARISON:  09/04/2013  FINDINGS: There is opacity at the left lung base developing since the prior study. This obscures left hemidiaphragm. Is likely combination of a small effusion with either infiltrate or atelectasis.  Lungs are otherwise clear. Cardiac silhouette is normal in size. No mediastinal or hilar masses. Changes from right breast surgery are stable.  IMPRESSION: New left lung base opacity most likely combination of a small effusion with either infiltrate or atelectasis. No other change.   Electronically Signed   By: Lajean Manes M.D.   On: 09/13/2013 08:09   Dg Chest Portable 1 View  09/04/2013   CLINICAL DATA:  Ventricular tachycardia.  Vomiting.  EXAM: PORTABLE CHEST - 1 VIEW  COMPARISON:  07/25/2013  FINDINGS: Heart size and pulmonary vascularity are normal. Lungs are clear although somewhat hyperinflated suggesting emphysema. No acute osseous abnormality. Surgical clips of visible in the right axilla.  IMPRESSION: No acute abnormality.  Probable emphysema.   Electronically Signed   By: Rozetta Nunnery M.D.   On: 09/04/2013 21:51    Microbiology: No results found for this or any previous visit (from the past 240 hour(s)).   Labs: Basic Metabolic Panel:  Recent Labs Lab 09/17/13 0325 09/18/13 0504 09/23/13 0428  NA 141 141 141  K 3.8 3.8 3.9  CL  105 105 105  CO2 27 27 27   GLUCOSE 76 76 85  BUN 14 14 13   CREATININE 1.50* 1.48* 1.13*  CALCIUM 7.4* 7.5* 7.7*  MG 1.5 1.7  --    Liver Function Tests: No results found for this basename: AST, ALT, ALKPHOS, BILITOT, PROT, ALBUMIN,  in the last 168 hours No results found for this basename: LIPASE, AMYLASE,  in the last 168 hours No results found for this basename: AMMONIA,  in the last 168 hours CBC:  Recent Labs Lab 09/17/13 0325 09/19/13 0400 09/23/13 0428  WBC 17.0* 16.5* 12.4*  NEUTROABS 12.7*  --   --   HGB 9.7* 9.5* 8.5*  HCT 29.1* 29.0* 25.9*  MCV 89.3 91.8 91.8  PLT 406* 464* 426*   Cardiac Enzymes: No results found for this basename: CKTOTAL, CKMB, CKMBINDEX, TROPONINI,  in the last 168 hours BNP: BNP (last 3 results)  Recent Labs  07/25/13 0308  PROBNP 4353.0*   CBG: No results found for this basename: GLUCAP,  in the last 168 hours     Signed:  AKULA,VIJAYA  [facility] 09/23/2013, 10:26 AM

## 2013-10-03 ENCOUNTER — Ambulatory Visit: Payer: Medicare Other | Admitting: Internal Medicine

## 2013-10-03 DIAGNOSIS — Z0289 Encounter for other administrative examinations: Secondary | ICD-10-CM

## 2013-10-14 ENCOUNTER — Encounter (HOSPITAL_BASED_OUTPATIENT_CLINIC_OR_DEPARTMENT_OTHER): Payer: Medicare Other | Attending: Plastic Surgery

## 2013-10-14 DIAGNOSIS — J449 Chronic obstructive pulmonary disease, unspecified: Secondary | ICD-10-CM | POA: Insufficient documentation

## 2013-10-14 DIAGNOSIS — E785 Hyperlipidemia, unspecified: Secondary | ICD-10-CM | POA: Insufficient documentation

## 2013-10-14 DIAGNOSIS — E039 Hypothyroidism, unspecified: Secondary | ICD-10-CM | POA: Insufficient documentation

## 2013-10-14 DIAGNOSIS — I129 Hypertensive chronic kidney disease with stage 1 through stage 4 chronic kidney disease, or unspecified chronic kidney disease: Secondary | ICD-10-CM | POA: Diagnosis not present

## 2013-10-14 DIAGNOSIS — Z79899 Other long term (current) drug therapy: Secondary | ICD-10-CM | POA: Insufficient documentation

## 2013-10-14 DIAGNOSIS — I739 Peripheral vascular disease, unspecified: Secondary | ICD-10-CM | POA: Insufficient documentation

## 2013-10-14 DIAGNOSIS — A0472 Enterocolitis due to Clostridium difficile, not specified as recurrent: Secondary | ICD-10-CM | POA: Diagnosis not present

## 2013-10-14 DIAGNOSIS — M86679 Other chronic osteomyelitis, unspecified ankle and foot: Secondary | ICD-10-CM | POA: Insufficient documentation

## 2013-10-14 DIAGNOSIS — L97409 Non-pressure chronic ulcer of unspecified heel and midfoot with unspecified severity: Secondary | ICD-10-CM | POA: Insufficient documentation

## 2013-10-14 DIAGNOSIS — Z9981 Dependence on supplemental oxygen: Secondary | ICD-10-CM | POA: Insufficient documentation

## 2013-10-14 DIAGNOSIS — J4489 Other specified chronic obstructive pulmonary disease: Secondary | ICD-10-CM | POA: Insufficient documentation

## 2013-10-14 DIAGNOSIS — N184 Chronic kidney disease, stage 4 (severe): Secondary | ICD-10-CM | POA: Diagnosis not present

## 2013-10-15 ENCOUNTER — Encounter: Payer: Self-pay | Admitting: Cardiology

## 2013-10-15 NOTE — Consult Note (Signed)
NAMEMarland Kitchen  Renee Donovan, Renee Donovan NO.:  [id number]  MEDICAL RECORD NO.:  [id number]  LOCATION:  FOOT                         FACILITY:  [place]  PHYSICIAN:  Irene Limbo, MD   DATE OF BIRTH:  Aug 17, 1930  DATE OF CONSULTATION:  10/14/2013 DATE OF DISCHARGE:                                CONSULTATION   CHIEF COMPLAINT:  Heel ulceration chronic with osteomyelitis.  HISTORY OF PRESENT ILLNESS:  The patient is an 80 year old nonambulatory female who presents with her ex daughter-in-law for evaluation of a chronic heel ulceration.  The patient herself has some degree of confusion per the daughter-in-law.  She reports that the wound has been present for approximately 6 months' time or longer.  She is currently living in [facility].  She spends all day in bed.  She did not present with any offloading boot.  They report that she has had one in the past.  She had MRI in June 2015, which showed osteomyelitis changes of her heel.  The patient has declined evaluation by Orthopedic surgery.  She is currently undergoing treatment for C. diff colitis.  She was admitted in June 2015 for ruptured appendicitis that was treated nonoperatively, given her comorbidities. Her C. diff is thought to be secondary to her prolonged antibiotic treatment for her ruptured appendicitis.  The daughter-in-law states that the POA for the patient is her grandchild, who lives in [place].  The son is not involved in the patient's care.  The patient is a DNR.  PAST MEDICAL HISTORY: 1. COPD. 2. Congestive heart failure. 3. History of alcohol abuse. 4. Peripheral vascular disease. 5. Hypothyroid. 6. Hypertension. 7. Chronic kidney disease, stage IV. 8. Protein malnutrition. 9. Wide-complex tachycardia. 10.Dyslipidemia.  MEDICATIONS:  Zofran, Xopenex, albuterol, Wellbutrin, amiodarone, clonidine, Synthroid, Mevacor, Spiriva, Lasix, metoprolol, nicotine patch, potassium,  Florastor.  PHYSICAL EXAMINATION: Height is 5 feet 7 inches, weight is 142 pounds.  Temperature is 97.8, pulse is 60, blood pressure is 153/83.  The patient has absent sensation over her right third toe and right calcaneus as well as the right dorsum of her foot. The remainder of tested points by Semmes-Weinstein is positive.  Her calculated ABI over the right lower extremity is 0.56.  Over her heel, she has a stage III pressure ulcer, measured as 1.2 x 1.4 x 0.1 cm.  The wound is granulated with slough present.  There is no surrounding cellulitis.  There is no significant drainage.  Right calf is 29.9 cm, right ankle is 17.8 cm.  LABORATORY DATA:  CBC dated June 29 revealed anemia with hemoglobin of 8.5, platelets are 426.  BMP dated from the same date reveals creatinine of 1.13.  There is no recent prealbumin for review.  ASSESSMENT:  Chronic heel ulceration.  We reviewed with the patient and the ex-daughter-in-law again her MRI findings.  We discussed that treatment for this would be IV antibiotic treatment, possible surgical debridement, and discussed again referral to Orthopedic surgery, they declined.  The patient is currently oxygen dependent and finds it very difficult to make visit.  She also relies mostly her ex-daughter-in-law for transportation.  Given this they state they are unable to follow up regularly and asked for  1 month follow up.  They were concerned there if there are concerns for the wound.  With regard to the wound, selected debridement was performed today.  After application of topical anesthetic, curette was used to remove all the superficial slough and fibrin.  Hemostasis was obtained with pressure.  The patient tolerated well.  We will institute Collagen dressing changes 3 times weekly with a foam heel protection followed by offloading boot.  We reviewed with the patient and her daughter-in-law that this needs to be used while she is in bed to prevent  further pressure.  Overall her discussion centered on limiting the amount of interventions offered to the patient, the ex- daughter-in-law stated that the patient is a DNR.  I counseled her that this does not mean that we cannot offer active treatment and indeed she is currently undergoing active antibiotic treatment for her C. difficile as well as other medical problems.  I recommend that she speak with the grandchild who is the POA and discussed with the family whether the patient would be more suited for palliative measures.  We will plan for followup in 1 month's time.          ______________________________ Irene Limbo, MD     BT/MEDQ  D:  10/14/2013  T:  10/15/2013  Job:  [id number]

## 2013-10-24 ENCOUNTER — Inpatient Hospital Stay: Payer: Medicare Other | Admitting: Infectious Disease

## 2013-11-04 ENCOUNTER — Ambulatory Visit: Payer: Medicare Other | Admitting: Cardiology

## 2013-12-04 ENCOUNTER — Ambulatory Visit: Payer: Medicare Other | Admitting: Cardiology

## 2014-03-31 DIAGNOSIS — B351 Tinea unguium: Secondary | ICD-10-CM | POA: Diagnosis not present

## 2014-03-31 DIAGNOSIS — M2041 Other hammer toe(s) (acquired), right foot: Secondary | ICD-10-CM | POA: Diagnosis not present

## 2014-03-31 DIAGNOSIS — I739 Peripheral vascular disease, unspecified: Secondary | ICD-10-CM | POA: Diagnosis not present

## 2014-03-31 DIAGNOSIS — R262 Difficulty in walking, not elsewhere classified: Secondary | ICD-10-CM | POA: Diagnosis not present

## 2014-03-31 DIAGNOSIS — M79676 Pain in unspecified toe(s): Secondary | ICD-10-CM | POA: Diagnosis not present

## 2014-04-08 DIAGNOSIS — Z79899 Other long term (current) drug therapy: Secondary | ICD-10-CM | POA: Diagnosis not present

## 2014-04-08 DIAGNOSIS — D649 Anemia, unspecified: Secondary | ICD-10-CM | POA: Diagnosis not present

## 2014-04-08 DIAGNOSIS — E785 Hyperlipidemia, unspecified: Secondary | ICD-10-CM | POA: Diagnosis not present

## 2014-04-08 DIAGNOSIS — I10 Essential (primary) hypertension: Secondary | ICD-10-CM | POA: Diagnosis not present

## 2014-04-11 DIAGNOSIS — M79621 Pain in right upper arm: Secondary | ICD-10-CM | POA: Diagnosis not present

## 2014-04-11 DIAGNOSIS — R509 Fever, unspecified: Secondary | ICD-10-CM | POA: Diagnosis not present

## 2014-04-11 DIAGNOSIS — R05 Cough: Secondary | ICD-10-CM | POA: Diagnosis not present

## 2014-04-11 DIAGNOSIS — M25511 Pain in right shoulder: Secondary | ICD-10-CM | POA: Diagnosis not present

## 2014-04-28 DEATH — deceased

## 2014-12-24 IMAGING — CR DG FOOT COMPLETE 3+V*L*
3 series · 3 of 3 positions shown · non-contrast
Comparison: None.

CLINICAL DATA: Ulcerations.

EXAM:
LEFT FOOT - COMPLETE 3+ VIEW

[x foot lat left]
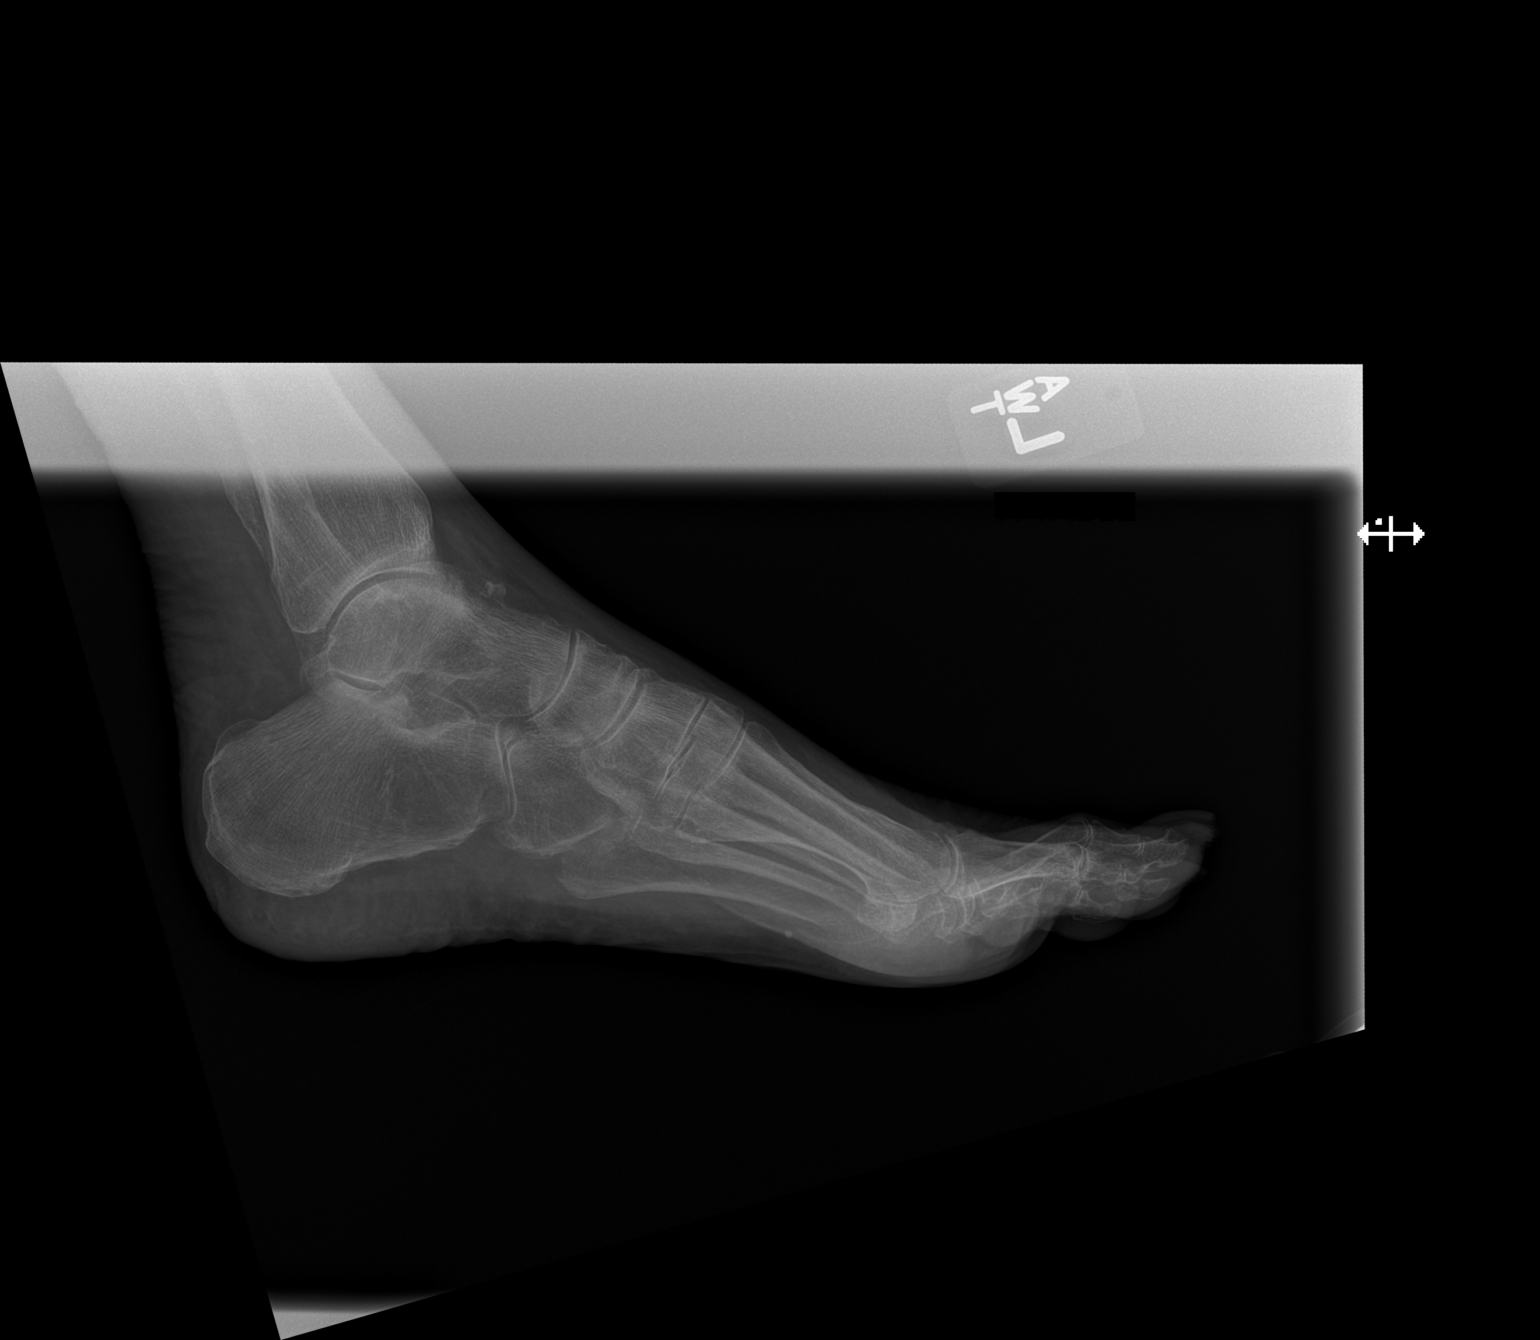

[x foot ap left]
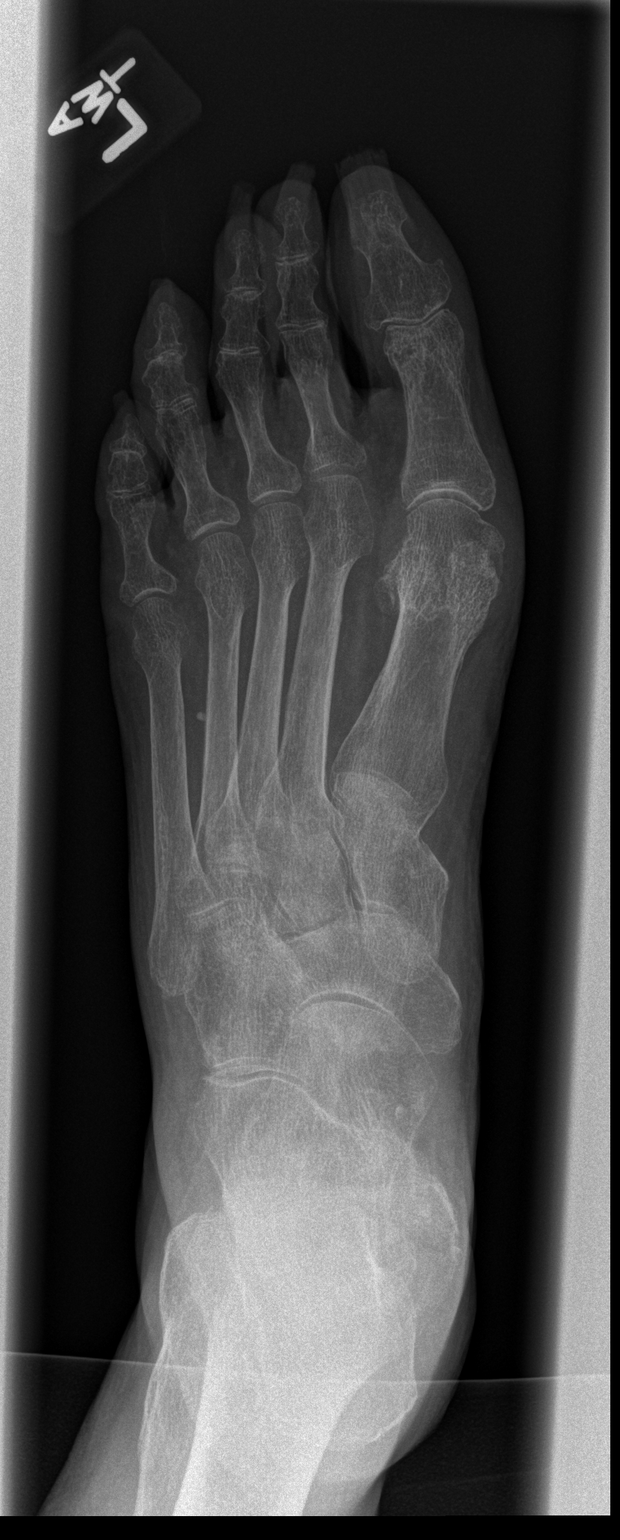

[x foot obl left]
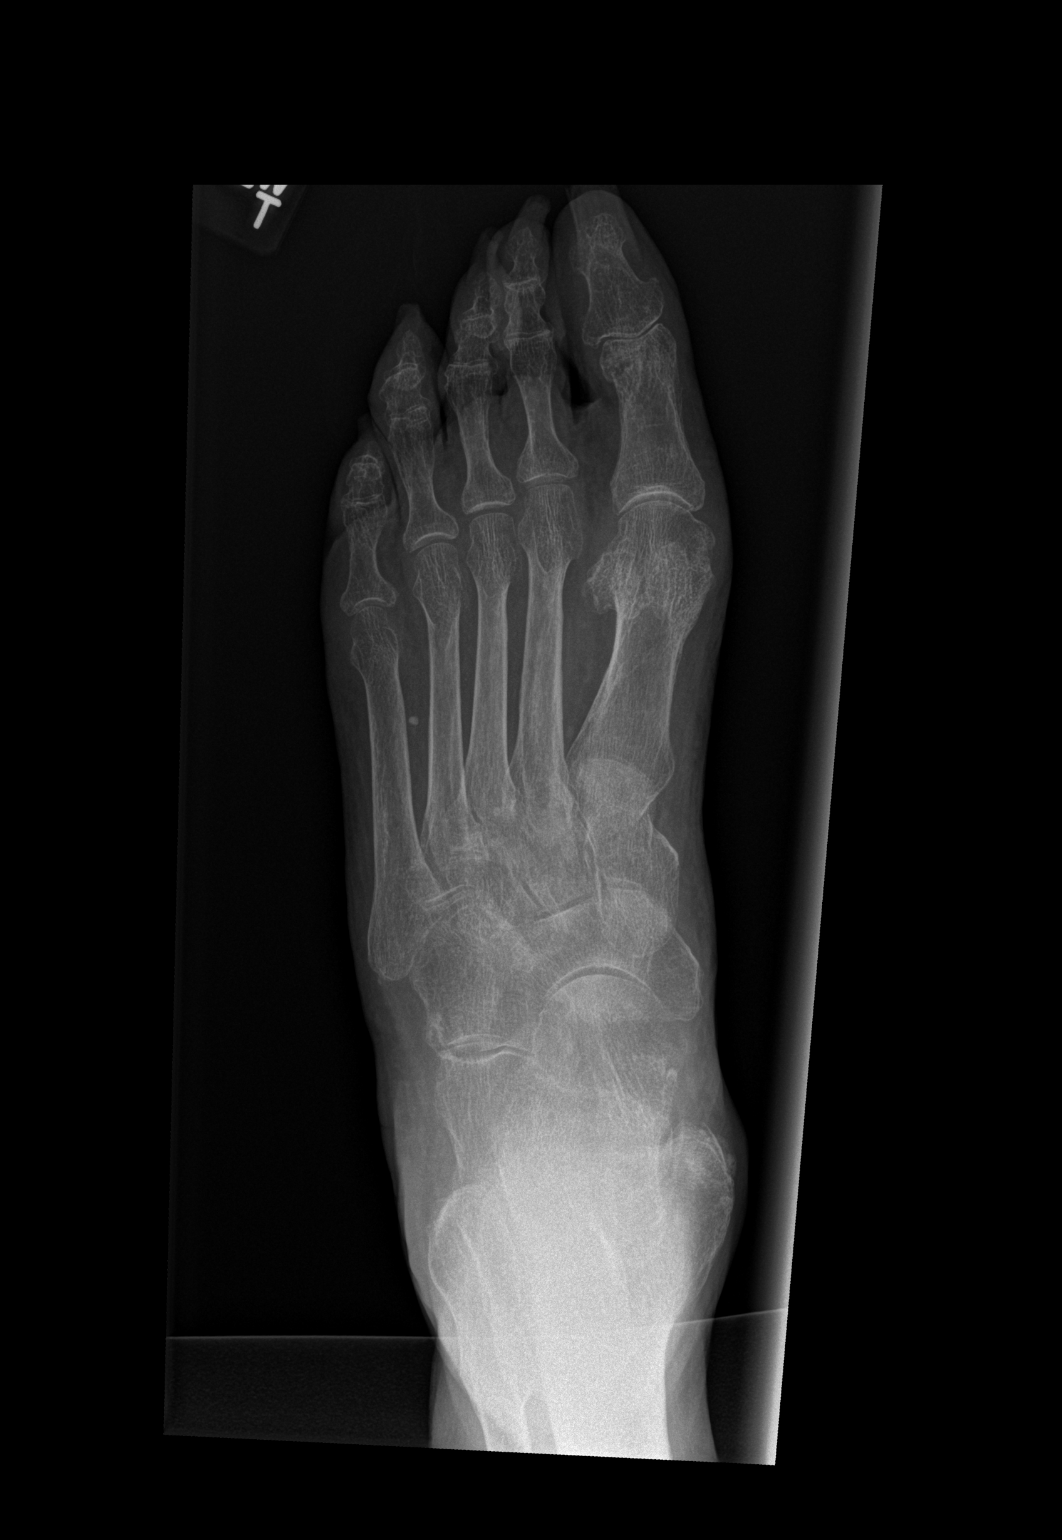

[3 of 3 positions shown; findings below may reference images not displayed]

FINDINGS: Diffuse severe osteopenia and degenerative change. No acute bony or
joint abnormality. If osteomyelitis is of clinical concern MRI can
be obtained.
IMPRESSION: Diffuse osteopenia and degenerative change, no acute bony
abnormality identified.

## 2014-12-27 IMAGING — CR DG CHEST 1V PORT
1 series · 1 of 1 positions shown · non-contrast
Comparison: Prior chest x-ray 07/18/2013

CLINICAL DATA: Dyspnea

EXAM:
PORTABLE CHEST - 1 VIEW

[AP]
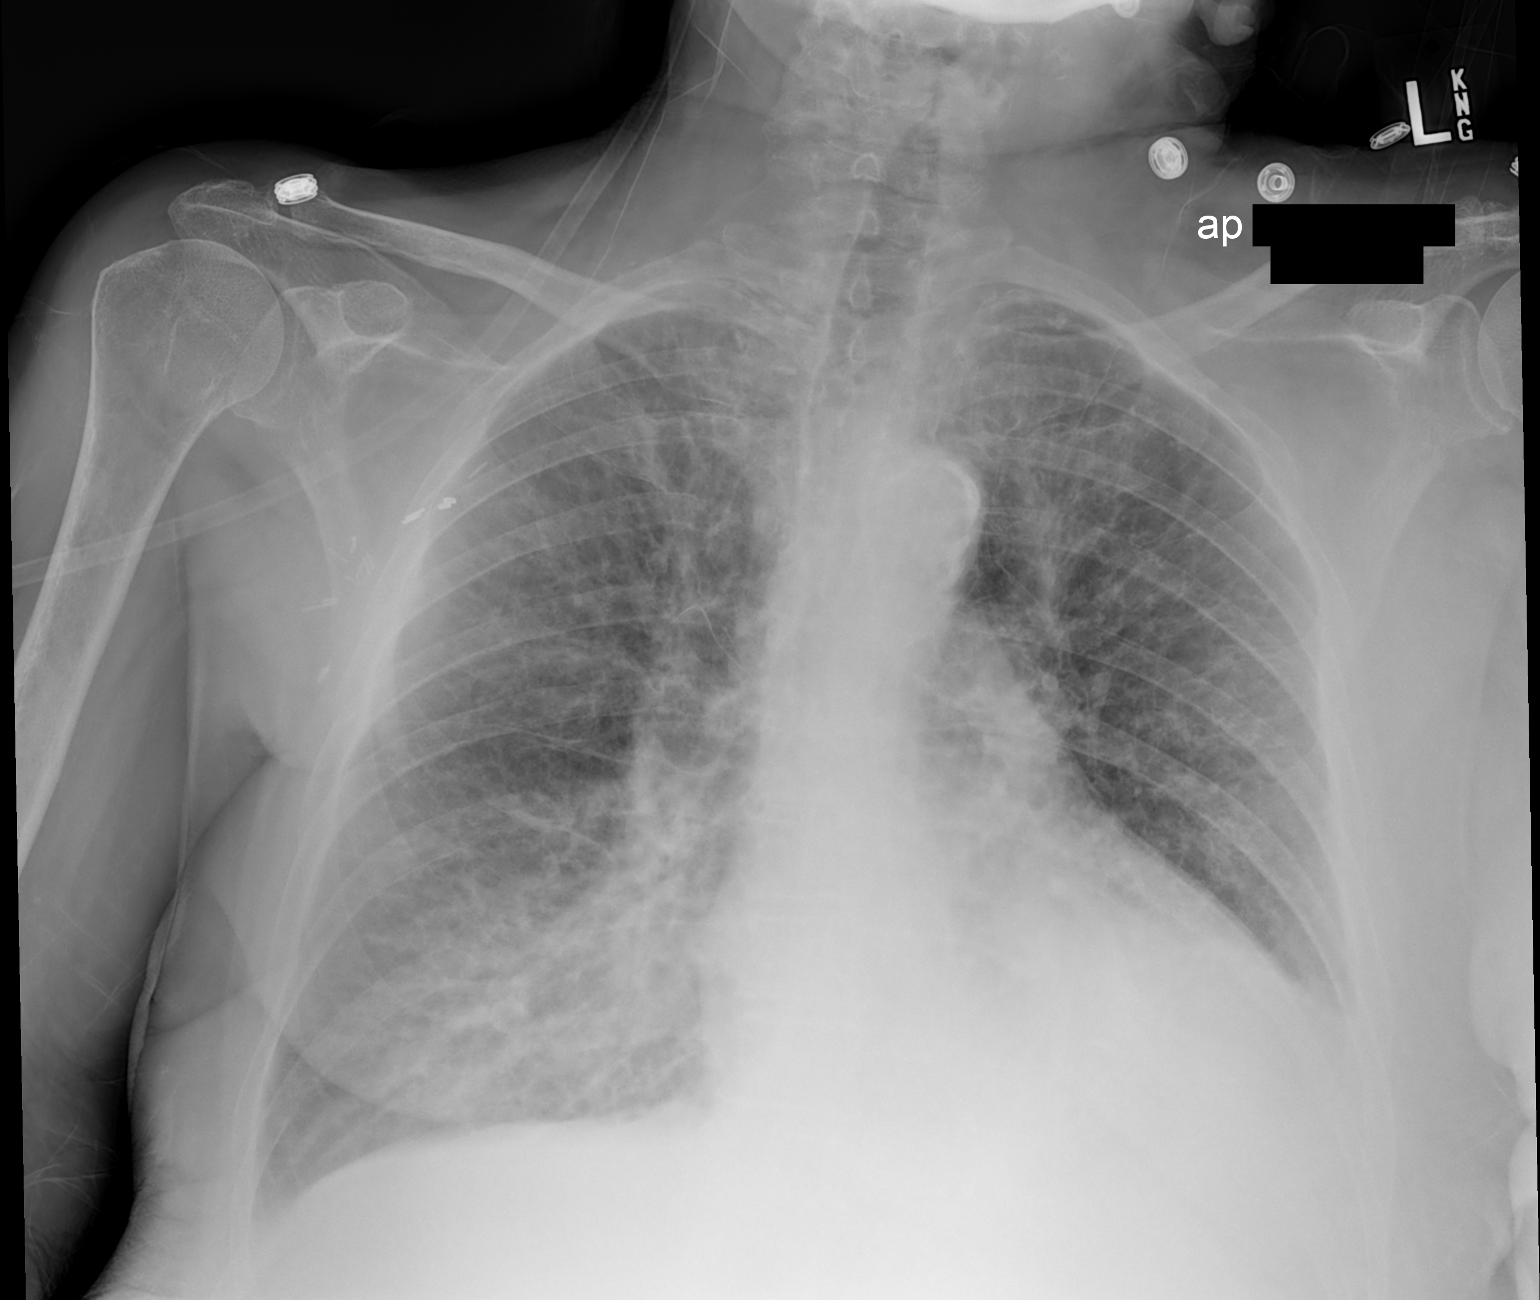

[1 of 1 positions shown; findings below may reference images not displayed]

FINDINGS: Stable mild cardiomegaly. Atherosclerotic calcifications noted in
the transverse aorta. Interval development of diffuse bilateral
interstitial opacities consistent with interstitial pulmonary edema.
Dense left basilar opacity favored to reflect a combination of
atelectasis developing alveolar edema. No pneumothorax. Small left
effusion not excluded. Surgical clips again noted in the right
axilla. No acute osseous abnormality.
IMPRESSION: 1. Interval development of mild CHF.
2. Left basilar opacity favored to reflect a combination of
atelectasis and developing alveolar edema. Small pleural effusion
not excluded.

## 2014-12-29 IMAGING — CR DG CHEST 1V PORT
1 series · 1 of 1 positions shown · non-contrast
Comparison: DG CHEST 1V PORT dated 07/22/2013

CLINICAL DATA: hypoxia

EXAM:
PORTABLE CHEST - 1 VIEW

[AP]
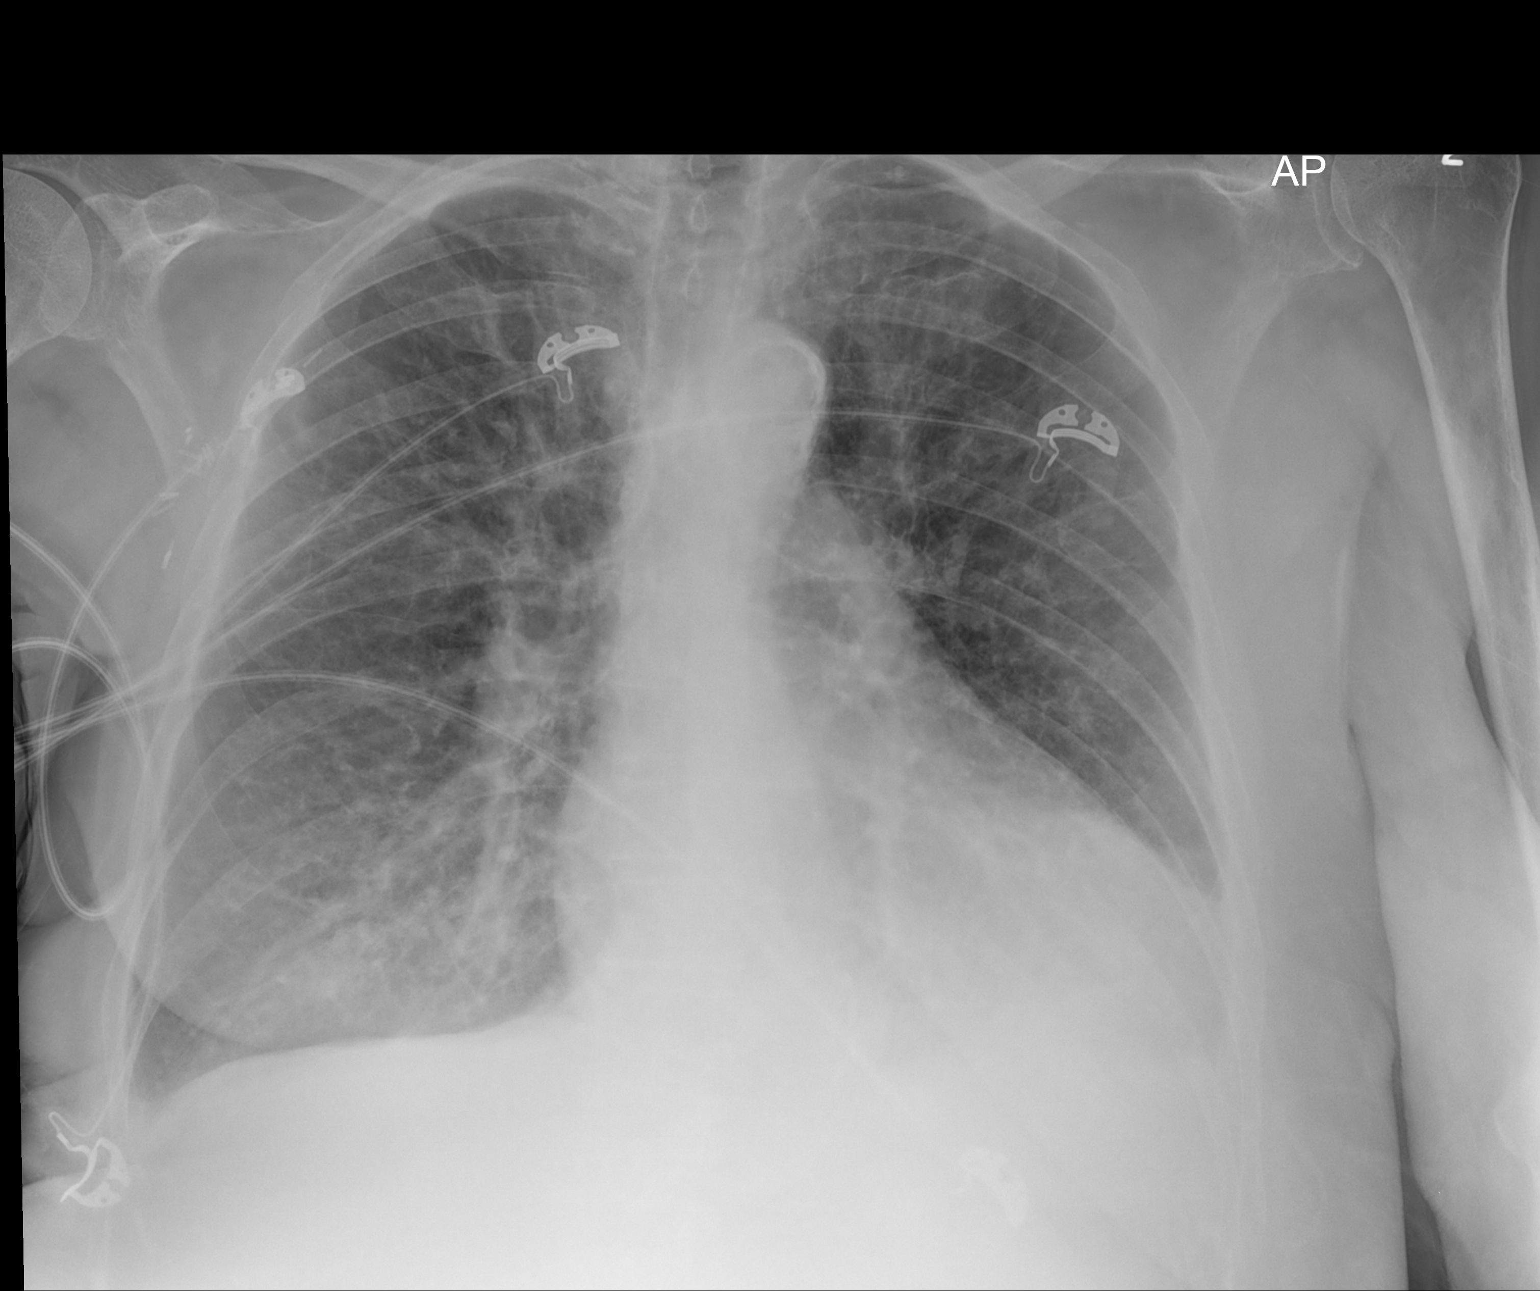

[1 of 1 positions shown; findings below may reference images not displayed]

FINDINGS: Low lung volumes. Cardiac silhouette is enlarged. Atherosclerotic
calcifications within the aorta. Increased density left lung base
and blunting of the left costophrenic angle. There is diffuse
prominence of interstitial markings and areas of mild peribronchial
cuffing.
IMPRESSION: Pulmonary edema

Atelectasis versus infiltrate left lung base and trace left
effusion.
